# Patient Record
Sex: Female | Born: 1960 | Race: White | Hispanic: No | Marital: Married | Smoking: Current every day smoker
Health system: Southern US, Community
[De-identification: ages and names within clinical notes are randomized; demographics above are authoritative.]

## PROBLEM LIST (undated history)

## (undated) DIAGNOSIS — C19 Malignant neoplasm of rectosigmoid junction: Secondary | ICD-10-CM

## (undated) DIAGNOSIS — R011 Cardiac murmur, unspecified: Secondary | ICD-10-CM

## (undated) DIAGNOSIS — Z8042 Family history of malignant neoplasm of prostate: Secondary | ICD-10-CM

## (undated) DIAGNOSIS — Z808 Family history of malignant neoplasm of other organs or systems: Secondary | ICD-10-CM

## (undated) DIAGNOSIS — K649 Unspecified hemorrhoids: Secondary | ICD-10-CM

## (undated) DIAGNOSIS — Z800 Family history of malignant neoplasm of digestive organs: Secondary | ICD-10-CM

## (undated) HISTORY — DX: Family history of malignant neoplasm of prostate: Z80.42

## (undated) HISTORY — DX: Family history of malignant neoplasm of digestive organs: Z80.0

## (undated) HISTORY — DX: Cardiac murmur, unspecified: R01.1

## (undated) HISTORY — PX: NO PAST SURGERIES: SHX2092

## (undated) HISTORY — DX: Family history of malignant neoplasm of other organs or systems: Z80.8

## (undated) HISTORY — DX: Unspecified hemorrhoids: K64.9

---

## 1998-05-23 ENCOUNTER — Other Ambulatory Visit: Admission: RE | Admit: 1998-05-23 | Discharge: 1998-05-23 | Payer: Self-pay | Admitting: Obstetrics and Gynecology

## 2004-10-17 ENCOUNTER — Encounter: Admission: RE | Admit: 2004-10-17 | Discharge: 2004-10-17 | Payer: Self-pay | Admitting: Gastroenterology

## 2010-06-26 ENCOUNTER — Encounter (HOSPITAL_COMMUNITY)
Admission: RE | Admit: 2010-06-26 | Discharge: 2010-08-17 | Payer: Self-pay | Source: Home / Self Care | Attending: Cardiology | Admitting: Cardiology

## 2010-10-29 LAB — HEPATIC FUNCTION PANEL
ALT: 81 U/L — ABNORMAL HIGH (ref 0–35)
AST: 57 U/L — ABNORMAL HIGH (ref 0–37)
Bilirubin, Direct: 0.1 mg/dL (ref 0.0–0.3)
Total Bilirubin: 0.9 mg/dL (ref 0.3–1.2)

## 2010-10-29 LAB — BASIC METABOLIC PANEL
CO2: 28 mEq/L (ref 19–32)
GFR calc non Af Amer: >60 mL/min (ref >60)
Glucose, Bld: 93 mg/dL (ref 70–99)
Potassium: 4.4 mEq/L (ref 3.5–5.1)
Sodium: 140 mEq/L (ref 135–145)

## 2010-10-29 LAB — LIPID PANEL
Total CHOL/HDL Ratio: 4.6 RATIO
Triglycerides: 98 mg/dL (ref <150)
VLDL: 20 mg/dL (ref 0–40)

## 2020-05-28 ENCOUNTER — Ambulatory Visit
Admission: EM | Admit: 2020-05-28 | Discharge: 2020-05-28 | Disposition: A | Discharge status: Home / Self Care | Payer: BC Managed Care – PPO

## 2020-05-28 DIAGNOSIS — R194 Change in bowel habit: Secondary | ICD-10-CM

## 2020-05-28 NOTE — ED Triage Notes (Signed)
Pt c/o having more frequent bowl movements after completing a round of amoxicillin 2 wks ago. Pt states has mucus and blood in stool.

## 2020-05-28 NOTE — ED Provider Notes (Signed)
[facility]    CSN: [id number] Arrival date & time: 05/28/20  1345      History   Chief Complaint Chief Complaint  Patient presents with  . blood in stool    HPI Angela Mcpherson is a 59 y.o. female presenting today for evaluation of change in stool.  Patient reports that she recently completed 2-week course of amoxicillin due to dental infection.  She reports towards the end of her course of antibiotics she began to develop looser more frequent stools as well as has noticed some occasional mucus and blood-tinged mucus in her stools.  Occasionally will notice bright red blood per rectum with wiping.  Blood and mucus is darker.  She denies any says abdominal pain.  Has had some slight pressure noted on bladder, but denies any urinary symptoms.  She occasionally will feel bloated, but denies currently.  Denies nausea or vomiting.  Has approximately 3-4 bowel movements per day, denies complete watery stools, still have some consistency.  She denies any recent travel or exposure to fresh water.  Denies any history of prior GI problems.  Denies prior surgeries on stomach.  Has never had colonoscopy.  Has been using apple cider vinegar and probiotics.     HPI  History reviewed. No pertinent past medical history.  There are no problems to display for this patient.   History reviewed. No pertinent surgical history.  OB History   No obstetric history on file.      Home Medications    Prior to Admission medications   Medication Sig Start Date End Date Taking? Authorizing Provider  calcium-vitamin D (OSCAL WITH D) 500-200 MG-UNIT tablet Take 1 tablet by mouth.   Yes [provider]    Family History No family history on file.  Social History Social History   Tobacco Use  . Smoking status: Current Every Day Smoker    Types: Cigarettes  . Smokeless tobacco: Never Used  Substance Use Topics  . Alcohol use: Yes    Comment: occ  . Drug use: Never      Allergies   Patient has no known allergies.   Review of Systems Review of Systems  Constitutional: Negative for fever.  Respiratory: Negative for shortness of breath.   Cardiovascular: Negative for chest pain.  Gastrointestinal: Positive for blood in stool. Negative for abdominal pain, diarrhea, nausea and vomiting.  Genitourinary: Negative for dysuria, flank pain, genital sores, hematuria, menstrual problem, vaginal bleeding, vaginal discharge and vaginal pain.  Musculoskeletal: Negative for back pain.  Skin: Negative for rash.  Neurological: Negative for dizziness, light-headedness and headaches.     Physical Exam Triage Vital Signs ED Triage Vitals  Enc Vitals Group     BP 05/28/20 1615 136/85     Pulse Rate 05/28/20 1615 85     Resp 05/28/20 1615 18     Temp 05/28/20 1615 98.1 F (36.7 C)     Temp Source 05/28/20 1615 Oral     SpO2 05/28/20 1615 98 %     Weight --      Height --      Head Circumference --      Peak Flow --      Pain Score 05/28/20 1616 0     Pain Loc --      Pain Edu? --      Excl. in [place]? --    No data found.  Updated Vital Signs BP 136/85 (BP Location: Left Arm)   Pulse 85  Temp 98.1 F (36.7 C) (Oral)   Resp 18   SpO2 98%   Visual Acuity Right Eye Distance:   Left Eye Distance:   Bilateral Distance:    Right Eye Near:   Left Eye Near:    Bilateral Near:     Physical Exam Vitals and nursing note reviewed.  Constitutional:      Appearance: She is well-developed.     Comments: No acute distress  HENT:     Head: Normocephalic and atraumatic.     Nose: Nose normal.  Eyes:     Conjunctiva/sclera: Conjunctivae normal.  Cardiovascular:     Rate and Rhythm: Normal rate.  Pulmonary:     Effort: Pulmonary effort is normal. No respiratory distress.     Comments: Breathing comfortably at rest, CTABL, no wheezing, rales or other adventitious sounds auscultated Abdominal:     General: There is no distension.     Tenderness:  There is no abdominal tenderness.     Comments: Soft, nondistended, nontender to palpation  Musculoskeletal:        General: Normal range of motion.     Cervical back: Neck supple.  Skin:    General: Skin is warm and dry.  Neurological:     Mental Status: She is alert and oriented to person, place, and time.      UC Treatments / Results  Labs (all labs ordered are listed, but only abnormal results are displayed) Labs Reviewed - No data to display  EKG   Radiology No results found.  Procedures Procedures (including critical care time)  Medications Ordered in UC Medications - No data to display  Initial Impression / Assessment and Plan / UC Course  I have reviewed the triage vital signs and the nursing notes.  Pertinent labs & imaging results that were available during my care of the patient were reviewed by me and considered in my medical decision making (see chart for details).     Frequent more loose stools after antibiotic use.  Recommend obtaining stool sample to evaluate for any underlying infectious causes and rule out C. difficile although seems less likely given no systemic symptoms in stool does continue to have some consistency.  Encourage patient to follow-up with gastroenterology given reported blood in stool and since she is 59 years old and has never had colonoscopy.  May continue probiotics and apple cider vinegar.  Do not suspect abdominal emergency requiring emergent imaging at this time.  Continue symptomatic and supportive care.  Discussed strict return precautions. Patient verbalized understanding and is agreeable with plan.  Final Clinical Impressions(s) / UC Diagnoses   Final diagnoses:  Frequent bowel movements  Change in stool habits     Discharge Instructions     Continue apple cider vinegar; Probiotics Return stool sample to send off Continue to monitor    ED Prescriptions    None     PDMP not reviewed this encounter.    Janith Lima, PA-C 05/28/20 1733

## 2020-05-28 NOTE — Discharge Instructions (Signed)
Continue apple cider vinegar; Probiotics Return stool sample to send off Continue to monitor

## 2020-05-29 ENCOUNTER — Telehealth: Payer: Self-pay

## 2020-05-30 LAB — GASTROINTESTINAL PANEL BY PCR, STOOL (REPLACES STOOL CULTURE)

## 2020-12-12 ENCOUNTER — Ambulatory Visit: Payer: 59 | Admitting: Gastroenterology

## 2020-12-12 ENCOUNTER — Other Ambulatory Visit: Payer: 59

## 2020-12-12 ENCOUNTER — Encounter: Payer: Self-pay | Admitting: Gastroenterology

## 2020-12-12 DIAGNOSIS — R195 Other fecal abnormalities: Secondary | ICD-10-CM

## 2020-12-12 DIAGNOSIS — R197 Diarrhea, unspecified: Secondary | ICD-10-CM

## 2020-12-12 MED ORDER — VANCOMYCIN HCL 125 MG PO CAPS: 125.0000 mg | ORAL_CAPSULE | Freq: Four times a day (QID) | ORAL | 0 refills | Status: AC

## 2020-12-12 NOTE — Progress Notes (Signed)
HPI: This is a very pleasant 61 year old woman who is here with her husband today.  She noticed a significant change in her bowels starting about a year ago when she was put on amoxicillin for a dental infection.  She underwent to 10-day courses of the amoxicillin and ever since then she has had significant bowel frequency.  She will go 10-20 times a day, usually soft pellet like stools.  She will get up in the middle the night at times to move her bowels as well.  Her weight has been overall stable.  She has no nausea or vomiting and no significant abdominal pains.  She has become very raw at her bottom and has had some anal discomfort as well as some periodic bleeding.  She has never had colon cancer screening that she is aware of  Old Data Reviewed: GI pathogen panel October 2021 was negative, of note it did not include C. difficile.   Review of systems: Pertinent positive and negative review of systems were noted in the above HPI section. All other review negative.   No past medical history on file.  No past surgical history on file.  Current Outpatient Medications  Medication Sig Dispense Refill  . cholecalciferol (VITAMIN D3) 25 MCG (1000 UNIT) tablet Take 1,000 Units by mouth daily.    . phenylephrine-shark liver oil-mineral oil-petrolatum (PREPARATION H) 0.25-14-74.9 % rectal ointment Place 1 application rectally as needed for hemorrhoids.    . psyllium (METAMUCIL) 0.52 g capsule Take 0.52 g by mouth daily.    . vitamin C (ASCORBIC ACID) 500 MG tablet Take 500 mg by mouth daily.     No current facility-administered medications for this visit.    Allergies as of 12/12/2020 - Review Complete 12/12/2020  Allergen Reaction Noted  . Streptogramins  12/12/2020    No family history on file.  Social History   Socioeconomic History  . Marital status: Married    Spouse name: Not on file  . Number of children: Not on file  . Years of education: Not on file  . Highest  education level: Not on file  Occupational History  . Not on file  Tobacco Use  . Smoking status: Current Every Day Smoker    Types: Cigarettes  . Smokeless tobacco: Never Used  Substance and Sexual Activity  . Alcohol use: Yes    Comment: occ  . Drug use: Never  . Sexual activity: Not on file  Other Topics Concern  . Not on file  Social History Narrative  . Not on file   Social Determinants of Health   Financial Resource Strain: Not on file  Food Insecurity: Not on file  Transportation Needs: Not on file  Physical Activity: Not on file  Stress: Not on file  Social Connections: Not on file  Intimate Partner Violence: Not on file     Physical Exam: BP 124/64   Pulse 72   Ht 5\' 3"  (1.6 m)   Wt 125 lb (56.7 kg)   BMI 22.14 kg/m  Constitutional: generally well-appearing Psychiatric: alert and oriented x3 Eyes: extraocular movements intact Mouth: oral pharynx moist, no lesions Neck: supple no lymphadenopathy Cardiovascular: heart regular rate and rhythm Lungs: clear to auscultation bilaterally Abdomen: soft, nontender, nondistended, no obvious ascites, no peritoneal signs, normal bowel sounds Extremities: no lower extremity edema bilaterally Skin: no lesions on visible extremities Rectal examination with female assistant and her husband in the room: No external anal hemorrhoids, no obvious anal fissures, there was some move  swelling internal anus very suggestive of swollen internal hemorrhoids.  Stool was absent.  Assessment and plan: 60 y.o. female with significant change in bowels since antibiotics for dental infection about 1 year ago  I think it is very possible that she has C. difficile infection.  She was really under the impression that that was "ruled out" by GI pathogen panel October 2021 however we pulled up her results through [facility] and I showed her that C. difficile was never even tested for.  We will get dedicated C. difficile testing today and I am  writing her prescription for vancomycin 125 mg, 4 times daily for 2 weeks.  She will not start this until after she sends in the stool testing.  She will return to see me in 2 months and knows to call if she is not feeling better with the above regimen.   Please see the "Patient Instructions" section for addition details about the plan.   Owens Loffler, MD [facility] 12/12/2020, 11:31 AM  Total time on date of encounter was 46  minutes (this included time spent preparing to see the patient reviewing records; obtaining and/or reviewing separately obtained history; performing a medically appropriate exam and/or evaluation; counseling and educating the patient and family if present; ordering medications, tests or procedures if applicable; and documenting clinical information in the health record).

## 2020-12-12 NOTE — Patient Instructions (Addendum)
If you are age 59 or younger, your body mass index should be between 19-25. Your Body mass index is 22.14 kg/m. If this is out of the aformentioned range listed, please consider follow up with your Primary Care Provider.   Your provider has requested that you go to the basement level for lab work before leaving today. Press "B" on the elevator. The lab is located at the first door on the left as you exit the elevator.  Due to recent changes in healthcare laws, you may see the results of your imaging and laboratory studies on MyChart before your provider has had a chance to review them.  We understand that in some cases there may be results that are confusing or concerning to you. Not all laboratory results come back in the same time frame and the provider may be waiting for multiple results in order to interpret others.  Please give [place] 48 hours in order for your provider to thoroughly review all the results before contacting the office for clarification of your results.   We have sent the following medications to your pharmacy for you to pick up at your convenience:  START: vancomycin 125mg  one tablet by mouth 4 times daily for 2 weeks. Please do NOT start medication until after you have results of lab work.  You will follow up in our office on 02-13-2021 at 9:50am  Thank you for entrusting me with your care and choosing [facility].  Dr Ardis Hughs

## 2020-12-14 LAB — CLOSTRIDIUM DIFFICILE TOXIN B, QUALITATIVE, REAL-TIME PCR: Toxigenic C. Difficile by PCR: NOT DETECTED

## 2021-01-08 ENCOUNTER — Telehealth: Payer: Self-pay | Admitting: Gastroenterology

## 2021-01-08 ENCOUNTER — Ambulatory Visit (AMBULATORY_SURGERY_CENTER): Payer: Self-pay

## 2021-01-08 ENCOUNTER — Other Ambulatory Visit: Payer: Self-pay

## 2021-01-08 VITALS — Ht 63.0 in | Wt 123.0 lb

## 2021-01-08 DIAGNOSIS — R197 Diarrhea, unspecified: Secondary | ICD-10-CM

## 2021-01-08 MED ORDER — NA SULFATE-K SULFATE-MG SULF 17.5-3.13-1.6 GM/177ML PO SOLN: 1.0000 | Freq: Once | ORAL | 0 refills | Status: AC

## 2021-01-08 NOTE — Progress Notes (Signed)
No allergies to soy or egg Pt is not on blood thinners or diet pills No hx of sedation/intubation Denies atrial flutter/fib Denies constipation   Emmi instructions given to pt  Pt is aware of Covid safety and care partner requirements.

## 2021-01-09 NOTE — Telephone Encounter (Signed)
Dr Angela Mcpherson the pt has complaints of very swollen painful hemorrhoids.  She says she has been taking hot showers, tried using Prep H,  and that has not helped.  I advised her to do Sitz baths and I will send Dr Angela Mcpherson a message to see if he would like to send prescription strength suppository.  She does have a colon scheduled for 6/20.  Please advise

## 2021-01-09 NOTE — Telephone Encounter (Signed)
Spoke with [facility] to have medication compounded.   Left message on machine to call back

## 2021-01-09 NOTE — Telephone Encounter (Signed)
Interesting, she had no external hemorrhoids on exam at her office visit recently.  Lets call in hydrocortisone/lidocaine topical gel 2.5% / 3%.  Use applicator to apply to her anal canal.  Apply twice daily, dispense 1 month, 2 refills.  Thanks

## 2021-01-09 NOTE — Telephone Encounter (Signed)
The pt has been advised and will pick up the prescription. She will keep appt as planned for colon.

## 2021-02-01 ENCOUNTER — Encounter: Payer: Self-pay | Admitting: Gastroenterology

## 2021-02-04 ENCOUNTER — Other Ambulatory Visit (INDEPENDENT_AMBULATORY_CARE_PROVIDER_SITE_OTHER): Payer: 59

## 2021-02-04 ENCOUNTER — Ambulatory Visit (AMBULATORY_SURGERY_CENTER): Payer: 59 | Admitting: Gastroenterology

## 2021-02-04 ENCOUNTER — Encounter: Payer: Self-pay | Admitting: Gastroenterology

## 2021-02-04 ENCOUNTER — Other Ambulatory Visit: Payer: Self-pay | Admitting: *Deleted

## 2021-02-04 ENCOUNTER — Other Ambulatory Visit: Payer: Self-pay

## 2021-02-04 VITALS — BP 107/60 | HR 72 | Temp 96.8°F | Resp 18 | Ht 63.0 in | Wt 123.0 lb

## 2021-02-04 DIAGNOSIS — C188 Malignant neoplasm of overlapping sites of colon: Secondary | ICD-10-CM

## 2021-02-04 DIAGNOSIS — R195 Other fecal abnormalities: Secondary | ICD-10-CM

## 2021-02-04 DIAGNOSIS — D125 Benign neoplasm of sigmoid colon: Secondary | ICD-10-CM | POA: Diagnosis not present

## 2021-02-04 DIAGNOSIS — R197 Diarrhea, unspecified: Secondary | ICD-10-CM

## 2021-02-04 DIAGNOSIS — K648 Other hemorrhoids: Secondary | ICD-10-CM

## 2021-02-04 DIAGNOSIS — D120 Benign neoplasm of cecum: Secondary | ICD-10-CM

## 2021-02-04 LAB — CBC WITH DIFFERENTIAL/PLATELET
Basophils Absolute: 0.1 10*3/uL (ref 0.0–0.1)
Basophils Relative: 0.8 % (ref 0.0–3.0)
Eosinophils Absolute: 0.1 10*3/uL (ref 0.0–0.7)
Eosinophils Relative: 0.5 % (ref 0.0–5.0)
HCT: 43.5 % (ref 36.0–46.0)
Hemoglobin: 14.8 g/dL (ref 12.0–15.0)
Lymphocytes Relative: 25.8 % (ref 12.0–46.0)
Lymphs Abs: 2.7 10*3/uL (ref 0.7–4.0)
MCHC: 34 g/dL (ref 30.0–36.0)
MCV: 90.8 fl (ref 78.0–100.0)
Monocytes Absolute: 0.6 10*3/uL (ref 0.1–1.0)
Monocytes Relative: 6.1 % (ref 3.0–12.0)
Neutro Abs: 7.1 10*3/uL (ref 1.4–7.7)
Neutrophils Relative %: 66.8 % (ref 43.0–77.0)
Platelets: 186 10*3/uL (ref 150.0–400.0)
RBC: 4.79 Mil/uL (ref 3.87–5.11)
RDW: 15.8 % — ABNORMAL HIGH (ref 11.5–15.5)
WBC: 10.6 10*3/uL — ABNORMAL HIGH (ref 4.0–10.5)

## 2021-02-04 LAB — COMPREHENSIVE METABOLIC PANEL
ALT: 71 U/L — ABNORMAL HIGH (ref 0–35)
AST: 48 U/L — ABNORMAL HIGH (ref 0–37)
Albumin: 4.6 g/dL (ref 3.5–5.2)
Alkaline Phosphatase: 100 U/L (ref 39–117)
BUN: 12 mg/dL (ref 6–23)
CO2: 23 mEq/L (ref 19–32)
Calcium: 9.4 mg/dL (ref 8.4–10.5)
Chloride: 105 mEq/L (ref 96–112)
Creatinine, Ser: 0.57 mg/dL (ref 0.40–1.20)
GFR: 99.5 mL/min (ref >60.00)
Glucose, Bld: 110 mg/dL — ABNORMAL HIGH (ref 70–99)
Potassium: 4.1 mEq/L (ref 3.5–5.1)
Sodium: 139 mEq/L (ref 135–145)
Total Bilirubin: 0.8 mg/dL (ref 0.2–1.2)
Total Protein: 7.6 g/dL (ref 6.0–8.3)

## 2021-02-04 MED ORDER — SODIUM CHLORIDE 0.9 % IV SOLN: 500.0000 mL | Freq: Once | INTRAVENOUS | Status: DC

## 2021-02-04 NOTE — Progress Notes (Signed)
VS by   Pt's states no medical or surgical changes since previsit or office visit.  

## 2021-02-04 NOTE — Progress Notes (Signed)
To PACU, VSS. Report to [email or web address] 

## 2021-02-04 NOTE — Progress Notes (Signed)
Post procedure patient used the restroom and had a small amount of blood in the bottom of the toilet.  Explained to patient that this was due to the biopsies being done so close to the rectum and that if it persists or gets worse to call us. Patient verbalized understanding.  Dr. Ardis Hughs made aware and agreed tpo this plan.

## 2021-02-04 NOTE — Progress Notes (Signed)
Called to room to assist during endoscopic procedure.  Patient ID and intended procedure confirmed with present staff. Received instructions for my participation in the procedure from the performing physician.  

## 2021-02-04 NOTE — Op Note (Signed)
[facility] Patient Name: Angela Mcpherson Procedure Date: 02/04/2021 8:27 AM MRN: [id number] Endoscopist: Milus Banister , MD Age: 60 Referring MD:  Date of Birth: 14-Aug-1961 Gender: Female Account #: [id number] Procedure:                Colonoscopy Indications:              Change in bowel habits, weight loss Medicines:                Monitored Anesthesia Care Procedure:                Pre-Anesthesia Assessment:                           - Prior to the procedure, a History and Physical                            was performed, and patient medications and                            allergies were reviewed. The patient's tolerance of                            previous anesthesia was also reviewed. The risks                            and benefits of the procedure and the sedation                            options and risks were discussed with the patient.                            All questions were answered, and informed consent                            was obtained. Prior Anticoagulants: The patient has                            taken no previous anticoagulant or antiplatelet                            agents. ASA Grade Assessment: II - A patient with                            mild systemic disease. After reviewing the risks                            and benefits, the patient was deemed in                            satisfactory condition to undergo the procedure.                           After obtaining informed consent, the colonoscope  was passed under direct vision. Throughout the                            procedure, the patient's blood pressure, pulse, and                            oxygen saturations were monitored continuously. The                            Olympus CF-HQ190L (Serial# [id number]) Colonoscope was                            introduced through the anus and advanced to the the                            cecum, identified by  appendiceal orifice and                            ileocecal valve. The colonoscopy was performed                            without difficulty. The patient tolerated the                            procedure well. The quality of the bowel                            preparation was good. The ileocecal valve,                            appendiceal orifice, and rectum were photographed. Scope In: 8:40:05 AM Scope Out: 9:08:38 AM Scope Withdrawal Time: 0 hours 14 minutes 51 seconds  Total Procedure Duration: 0 hours 28 minutes 33 seconds  Findings:                 Two sessile polyps were found in the sigmoid colon                            and cecum. The polyps were 2 to 6 mm in size. These                            polyps were removed with a cold snare. Resection                            and retrieval were complete.                           A fungating, infiltrative and ulcerated partially                            obstructing mass was found in the recto-sigmoid                            colon (distal edge about 7cm  from the anus). The                            mass was partially circumferential (involving                            one-half of the lumen circumference). The mass                            measured eight cm in length. This was biopsied with                            a cold forceps for histology. The distal edge of                            the mass was tattooed with two submucosal                            injections of Spot (carbon black).                           Internal hemorrhoids were found. The hemorrhoids                            were small.                           The exam was otherwise without abnormality on                            direct and retroflexion views. Complications:            No immediate complications. Estimated blood loss:                            None. Estimated Blood Loss:     Estimated blood loss: none. Impression:                - Two 2 to 6 mm polyps in the sigmoid colon and in                            the cecum, removed with a cold snare. Resected and                            retrieved.                           - A fungating, infiltrative and ulcerated partially                            obstructing mass was found in the recto-sigmoid                            colon (distal edge about 7cm from the anus). The  mass was partially circumferential (involving                            one-half of the lumen circumference). The mass                            measured eight cm in length. This was biopsied with                            a cold forceps for histology. The distal edge of                            the mass was tattooed with two submucosal                            injections of Spot (carbon black). Sigmoid colon                            felt somewhat fixed in place (large CRC or perhaps                            invasion into colon from other primary site?)                           - Internal hemorrhoids.                           - The examination was otherwise normal on direct                            and retroflexion views. Recommendation:           - Patient has a contact number available for                            emergencies. The signs and symptoms of potential                            delayed complications were discussed with the                            patient. Return to normal activities tomorrow.                            Written discharge instructions were provided to the                            patient.                           - Resume previous diet.                           - Continue present medications.                           -  Await pathology results. Sent [facility]                           - Staging workup: CT scan chest/abd/pelvis with IV                            and oral contrast. Also labs CBC, CMET, CEA.                            - Start referral process to [facility]. Milus Banister, MD 02/04/2021 9:16:02 AM This report has been signed electronically.

## 2021-02-04 NOTE — Progress Notes (Signed)
Post procedure patient used the toilet and had small amount of blood in the bottom of the toilet.  Explained to patient that it was due to the biopsies being taken in the lower part of the rectum and to call if the bleeding continues. Patient verbalized understanding.

## 2021-02-04 NOTE — Patient Instructions (Signed)
Information on polyps and hemorrhoids given to you today.  Await pathology results.  Resume previous diet and medications.  CT scan of chest/abdomen/pelvis with IV and oral contrast.  Also labwork to be done today.  Start referral process to [facility].  YOU HAD AN ENDOSCOPIC PROCEDURE TODAY AT [facility]:   Refer to the procedure report that was given to you for any specific questions about what was found during the examination.  If the procedure report does not answer your questions, please call your gastroenterologist to clarify.  If you requested that your care partner not be given the details of your procedure findings, then the procedure report has been included in a sealed envelope for you to review at your convenience later.  YOU SHOULD EXPECT: Some feelings of bloating in the abdomen. Passage of more gas than usual.  Walking can help get rid of the air that was put into your GI tract during the procedure and reduce the bloating. If you had a lower endoscopy (such as a colonoscopy or flexible sigmoidoscopy) you may notice spotting of blood in your stool or on the toilet paper. If you underwent a bowel prep for your procedure, you may not have a normal bowel movement for a few days.  Please Note:  You might notice some irritation and congestion in your nose or some drainage.  This is from the oxygen used during your procedure.  There is no need for concern and it should clear up in a day or so.  SYMPTOMS TO REPORT IMMEDIATELY:  Following lower endoscopy (colonoscopy or flexible sigmoidoscopy):  Excessive amounts of blood in the stool  Significant tenderness or worsening of abdominal pains  Swelling of the abdomen that is new, acute  Fever of 100F or higher  For urgent or emergent issues, a gastroenterologist can be reached at any hour by calling [phone]. Do not use MyChart messaging for urgent concerns.    DIET:  We do recommend a small meal at first, but  then you may proceed to your regular diet.  Drink plenty of fluids but you should avoid alcoholic beverages for 24 hours.  ACTIVITY:  You should plan to take it easy for the rest of today and you should NOT DRIVE or use heavy machinery until tomorrow (because of the sedation medicines used during the test).    FOLLOW UP: Our staff will call the number listed on your records 48-72 hours following your procedure to check on you and address any questions or concerns that you may have regarding the information given to you following your procedure. If we do not reach you, we will leave a message.  We will attempt to reach you two times.  During this call, we will ask if you have developed any symptoms of COVID 19. If you develop any symptoms (ie: fever, flu-like symptoms, shortness of breath, cough etc.) before then, please call [phone].  If you test positive for Covid 19 in the 2 weeks post procedure, please call and report this information to [place].    If any biopsies were taken you will be contacted by phone or by letter within the next 1-3 weeks.  Please call us at [phone] if you have not heard about the biopsies in 3 weeks.    SIGNATURES/CONFIDENTIALITY: You and/or your care partner have signed paperwork which will be entered into your electronic medical record.  These signatures attest to the fact that that the information above on your After  Visit Summary has been reviewed and is understood.  Full responsibility of the confidentiality of this discharge information lies with you and/or your care-partner.

## 2021-02-05 ENCOUNTER — Other Ambulatory Visit: Payer: Self-pay

## 2021-02-05 ENCOUNTER — Other Ambulatory Visit: Payer: Self-pay | Admitting: *Deleted

## 2021-02-05 DIAGNOSIS — K6389 Other specified diseases of intestine: Secondary | ICD-10-CM

## 2021-02-05 LAB — CEA: CEA: 596.9 ng/mL — ABNORMAL HIGH

## 2021-02-06 ENCOUNTER — Telehealth: Payer: Self-pay

## 2021-02-06 NOTE — Telephone Encounter (Signed)
LVM

## 2021-02-08 ENCOUNTER — Other Ambulatory Visit: Payer: Self-pay

## 2021-02-08 ENCOUNTER — Ambulatory Visit (HOSPITAL_COMMUNITY)
Admission: RE | Admit: 2021-02-08 | Discharge: 2021-02-08 | Disposition: A | Discharge status: Home / Self Care | Payer: 59 | Source: Ambulatory Visit | Attending: Gastroenterology | Admitting: Gastroenterology

## 2021-02-08 DIAGNOSIS — K6389 Other specified diseases of intestine: Secondary | ICD-10-CM | POA: Insufficient documentation

## 2021-02-08 MED ORDER — IOHEXOL 300 MG/ML  SOLN
100.0000 mL | Freq: Once | INTRAMUSCULAR | Status: AC | PRN
  Administered 2021-02-08: 100 mL via INTRAVENOUS

## 2021-02-08 MED ORDER — SODIUM CHLORIDE (PF) 0.9 % IJ SOLN
INTRAMUSCULAR | Status: AC
  Filled 2021-02-08: qty 50

## 2021-02-11 ENCOUNTER — Other Ambulatory Visit: Payer: Self-pay

## 2021-02-11 DIAGNOSIS — K6389 Other specified diseases of intestine: Secondary | ICD-10-CM

## 2021-02-11 NOTE — Progress Notes (Addendum)
[place]   Telephone:([phone] Fax:([phone]) [place] Note   Patient Care Team: Pcp, No as PCP - General Truitt Merle, MD as Consulting Physician (Oncology)  Date of Service:  02/12/2021   CHIEF COMPLAINTS/PURPOSE OF CONSULTATION:  Metastatic Colon cancer  REFERRING PHYSICIAN:  Dr Ardis Hughs  Oncology History Overview Note  Cancer Staging Malignant neoplasm of rectosigmoid junction [facility]) Staging form: Colon and Rectum, AJCC 8th Edition - Clinical stage from 02/04/2021: Stage IVB (cTX, cN2, cM1b) - Signed by Truitt Merle, MD on 02/12/2021 Stage prefix: Initial diagnosis    Malignant neoplasm of rectosigmoid junction ([place])  02/04/2021 Procedure   Colonoscopy by Dr Ardis Hughs 02/04/21 IMPRESSION - Two 2 to 6 mm polyps in the sigmoid colon and in the cecum, removed with a cold snare. Resected and retrieved. - A fungating, infiltrative and ulcerated partially obstructing mass was found in the rectosigmoid colon (distal edge about 7cm from the anus). The mass was partially circumferential (involving one-half of the lumen circumference). The mass measured eight cm in length. This was biopsied with a cold forceps for histology. The distal edge of the mass was tattooed with two submucosal injections of Spot (carbon black). Sigmoid colon felt somewhat fixed in place (large CRC or perhaps invasion into colon from other primary site?) - Internal hemorrhoids. - The examination was otherwise normal on direct and retroflexion views.   02/04/2021 Pathology Results   Diagnosis 1. 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 - NO HIGH-GRADE DYSPLASIA OR CARCINOMA. 2. Colon, biopsy, rectosigmoid - INVASIVE ADENOCARCINOMA. - SEE NOTE.   02/04/2021 Cancer Staging   Staging form: Colon and Rectum, AJCC 8th Edition - Clinical stage from 02/04/2021: Stage IVB (cTX, cN2, cM1b) - Signed by Truitt Merle, MD on 02/12/2021  Stage prefix: Initial diagnosis     02/08/2021 Imaging   CT CHEST, ABDOMEN AND PELVIS W CONTRAST  IMPRESSION: 1. There is a large, concentric mass of the rectosigmoid junction measuring 7.8 x 5.9 x 5.2 cm. Findings are consistent with primary colon malignancy identified by colonoscopy. 2. Multiple enlarged perirectal, bilateral pelvic sidewall, and retroperitoneal lymph nodes, consistent with nodal metastatic disease. 3. There are multiple small low-attenuation lesions of the liver parenchyma, incompletely characterized although suspicious for hepatic metastatic disease. PET-CT or multiphasic contrast enhanced MRI could be used to more clearly characterize. 4. There are multiple small bilateral pulmonary nodules, measuring 3 mm and smaller, nonspecific although highly suspicious for early pulmonary metastatic disease given overall constellation of findings.   02/12/2021 Initial Diagnosis   Malignant neoplasm of rectosigmoid junction ([facility])     HISTORY OF PRESENTING ILLNESS:  Angela Mcpherson 60 y.o. female is a here because of metastatic colon cancer. The patient was referred by Dr Ardis Hughs. The patient presents to the clinic today accompanied by her husband Antony Haste.   She presents with frequent bowel movement and rectal pain for past 10-12 months.  She initially had antibiotics for dental issue, and thought it was related to her antibiotics.  She has not seen doctors for the past 30 years.  She initially thought it was related to hemorrhoid, and did not seek medical attention.  She has tried changing her diet, with some improvement.  Due to worsening symptoms and intermittent mild hematochezia, she did go to urgent care in 05/2020.  She was recommended to see GI.  In April 2022, she saw Dr. Ardis Hughs (who is her husband's GI), and finally had colonoscopy ON 02/04/2021, which showed  a large ulcerative, partially obstructive mass in the rectosigmoid colon, biopsy confirmed adenocarcinoma.  She had a staging CT scan a few days ago.  She  has mild abdominal bloating, and gasy feeling, no nausea, vomiting.  Her appetite is normal, she has lost 20 pounds in the past year.  She reports her rectal pain has been worse lately, persistent, but fluctuates.  The pain level is 8-9/10 per pt. she has not tried any over-the-counter pain medication. She has mild to moderate fatigue, energy is low, 60-70% of normal, she has slowed down her activities, but still functions well at home.  She lives with her husband, has no children.   REVIEW OF SYSTEMS:    Constitutional: Denies fevers, chills or abnormal night sweats, (+) fatigue and weight loss  Eyes: Denies blurriness of vision, double vision or watery eyes Ears, nose, mouth, throat, and face: Denies mucositis or sore throat Respiratory: Denies cough, dyspnea or wheezes Cardiovascular: Denies palpitation, chest discomfort or lower extremity swelling Gastrointestinal:  see HPI  Lymphatics: Denies new lymphadenopathy or easy bruising Neurological:Denies numbness, tingling or new weaknesses Behavioral/Psych: Mood is stable, no new changes  All other systems were reviewed with the patient and are negative.   MEDICAL HISTORY:  Past Medical History:  Diagnosis Date   Heart murmur    dx at 26   MVP is stable   Hemorrhoids     SURGICAL HISTORY: Past Surgical History:  Procedure Laterality Date   NO PAST SURGERIES      SOCIAL HISTORY: Social History   Socioeconomic History   Marital status: Married    Spouse name: Not on file   Number of children: Not on file   Years of education: Not on file   Highest education level: Not on file  Occupational History   Not on file  Tobacco Use   Smoking status: Every Day    Pack years: 0.00    Types: Cigarettes   Smokeless tobacco: Never  Vaping Use   Vaping Use: Never used  Substance and Sexual Activity   Alcohol use: Yes    Comment: occ   Drug use: Never   Sexual activity: Not on file  Other Topics Concern   Not on file  Social  History Narrative   Not on file   Social Determinants of Health   Financial Resource Strain: Not on file  Food Insecurity: Not on file  Transportation Needs: Not on file  Physical Activity: Not on file  Stress: Not on file  Social Connections: Not on file  Intimate Partner Violence: Not on file    FAMILY HISTORY: Family History  Problem Relation Age of Onset   Stomach cancer Maternal Grandfather 48   Colon cancer Neg Hx    Colon polyps Neg Hx    Esophageal cancer Neg Hx    Rectal cancer Neg Hx     ALLERGIES:  is allergic to codeine, epinephrine (anaphylaxis), and streptogramins.  MEDICATIONS:  Current Outpatient Medications  Medication Sig Dispense Refill   cholecalciferol (VITAMIN D3) 25 MCG (1000 UNIT) tablet Take 1,000 Units by mouth daily.     hydrocortisone 2.5 % cream Apply to anal canal twice daily as directed.     phenylephrine-shark liver oil-mineral oil-petrolatum (PREPARATION H) 0.25-14-74.9 % rectal ointment Place 1 application rectally as needed for hemorrhoids.     psyllium (METAMUCIL) 0.52 g capsule Take 0.52 g by mouth daily. (Patient not taking: Reported on 02/04/2021)     VALERIAN ROOT PO Take by mouth.  vitamin C (ASCORBIC ACID) 500 MG tablet Take 500 mg by mouth daily.     No current facility-administered medications for this visit.    PHYSICAL EXAMINATION: ECOG PERFORMANCE STATUS: 1 - Symptomatic but completely ambulatory  Vitals:   02/12/21 0854  BP: 108/74  Pulse: 71  Resp: 17  Temp: 98.1 F (36.7 C)  SpO2: 100%   Filed Weights   02/12/21 0854  Weight: 114 lb 12.8 oz (52.1 kg)    GENERAL:alert, no distress and comfortable SKIN: skin color, texture, turgor are normal, no rashes or significant lesions EYES: normal, Conjunctiva are pink and non-injected, sclera clear NECK: supple, thyroid normal size, non-tender, without nodularity LYMPH:  no palpable lymphadenopathy in the cervical, axillary  LUNGS: clear to auscultation and  percussion with normal breathing effort HEART: regular rate & rhythm and no murmurs and no lower extremity edema ABDOMEN:abdomen soft, non-tender and normal bowel sounds Musculoskeletal:no cyanosis of digits and no clubbing  NEURO: alert & oriented x 3 with fluent speech, no focal motor/sensory deficits  LABORATORY DATA:  I have reviewed the data as listed CBC Latest Ref Rng & Units 02/04/2021  WBC 4.0 - 10.5 K/uL 10.6(H)  Hemoglobin 12.0 - 15.0 g/dL 14.8  Hematocrit 36.0 - 46.0 % 43.5  Platelets 150.0 - 400.0 K/uL 186.0    CMP Latest Ref Rng & Units 02/04/2021 06/26/2010  Glucose 70 - 99 mg/dL 110(H) 93  BUN 6 - 23 mg/dL 12 12  Creatinine 0.40 - 1.20 mg/dL 0.57 0.69  Sodium 135 - 145 mEq/L 139 140  Potassium 3.5 - 5.1 mEq/L 4.1 4.4  Chloride 96 - 112 mEq/L 105 107  CO2 19 - 32 mEq/L 23 28  Calcium 8.4 - 10.5 mg/dL 9.4 9.6  Total Protein 6.0 - 8.3 g/dL 7.6 7.0  Total Bilirubin 0.2 - 1.2 mg/dL 0.8 0.9  Alkaline Phos 39 - 117 U/L 100 112  AST 0 - 37 U/L 48(H) 57(H)  ALT 0 - 35 U/L 71(H) 81(H)     RADIOGRAPHIC STUDIES: I have personally reviewed the radiological images as listed and agreed with the findings in the report. CT CHEST W CONTRAST  Result Date: 02/10/2021 CLINICAL DATA:  Colon mass identified by colonoscopy, weight loss EXAM: CT CHEST, ABDOMEN, AND PELVIS WITH CONTRAST TECHNIQUE: Multidetector CT imaging of the chest, abdomen and pelvis was performed following the standard protocol during bolus administration of intravenous contrast. CONTRAST:  156m OMNIPAQUE IOHEXOL 300 MG/ML SOLN, additional oral enteric contrast COMPARISON:  None. FINDINGS: CT CHEST FINDINGS Cardiovascular: No significant vascular findings. Normal heart size. No pericardial effusion. Mediastinum/Nodes: No enlarged mediastinal, hilar, or axillary lymph nodes. Thyroid gland, trachea, and esophagus demonstrate no significant findings. Lungs/Pleura: There are multiple small bilateral pulmonary nodules, for  example a 3 mm nodule of the right lower lobe (series 7, image 88), a 3 mm nodule of the peripheral right upper lobe (series 7, image 4), and a 2 mm nodule left lower lobe (series 7, image 94). No pleural effusion or pneumothorax. Musculoskeletal: No chest wall mass or suspicious bone lesions identified. CT ABDOMEN PELVIS FINDINGS Hepatobiliary: There are multiple small low-attenuation lesions of the liver parenchyma, including of the lateral right lobe, hepatic segment VI, measuring 0.6 cm (series 2, image 64), of the posterior liver dome, hepatic segment VII, measuring 1.3 x 0.9 cm (series 2, image 51), and of the tip of the left lobe of the liver, hepatic segment 2 measuring 1.5 x 1.3 cm (series 2, image 47). No gallstones, gallbladder wall  thickening, or biliary dilatation. Pancreas: Unremarkable. No pancreatic ductal dilatation or surrounding inflammatory changes. Spleen: Normal in size without significant abnormality. Adrenals/Urinary Tract: Adrenal glands are unremarkable. Kidneys are normal, without renal calculi, solid lesion, or hydronephrosis. Bladder is unremarkable. Stomach/Bowel: Stomach is within normal limits. Appendix appears normal. There is a large, concentric mass of the rectosigmoid junction measuring 7.8 x 5.9 x 5.2 cm (series 2, 104 series 6, image 84). Vascular/Lymphatic: Aortic atherosclerosis. Multiple enlarged perirectal lymph nodes measuring up to 1.0 x 0.9 cm (series 2, 106). Enlarged bilateral pelvic sidewall lymph nodes measuring to 2.5 x 1.9 cm on the left (series 2, image 110). Numerous prominent retroperitoneal lymph nodes, largest left retroperitoneal nodes measuring up to 1.4 x 1.4 cm (series 4, image 69). Reproductive: No mass or other abnormality. Other: No abdominal wall hernia or abnormality. No abdominopelvic ascites. Musculoskeletal: No acute or significant osseous findings. IMPRESSION: 1. There is a large, concentric mass of the rectosigmoid junction measuring 7.8 x 5.9 x  5.2 cm. Findings are consistent with primary colon malignancy identified by colonoscopy. 2. Multiple enlarged perirectal, bilateral pelvic sidewall, and retroperitoneal lymph nodes, consistent with nodal metastatic disease. 3. There are multiple small low-attenuation lesions of the liver parenchyma, incompletely characterized although suspicious for hepatic metastatic disease. PET-CT or multiphasic contrast enhanced MRI could be used to more clearly characterize. 4. There are multiple small bilateral pulmonary nodules, measuring 3 mm and smaller, nonspecific although highly suspicious for early pulmonary metastatic disease given overall constellation of findings. Aortic Atherosclerosis (ICD10-I70.0). Electronically Signed   By: Eddie Candle M.D.   On: 02/10/2021 15:51   CT Abdomen Pelvis W Contrast  Result Date: 02/10/2021 CLINICAL DATA:  Colon mass identified by colonoscopy, weight loss EXAM: CT CHEST, ABDOMEN, AND PELVIS WITH CONTRAST TECHNIQUE: Multidetector CT imaging of the chest, abdomen and pelvis was performed following the standard protocol during bolus administration of intravenous contrast. CONTRAST:  110m OMNIPAQUE IOHEXOL 300 MG/ML SOLN, additional oral enteric contrast COMPARISON:  None. FINDINGS: CT CHEST FINDINGS Cardiovascular: No significant vascular findings. Normal heart size. No pericardial effusion. Mediastinum/Nodes: No enlarged mediastinal, hilar, or axillary lymph nodes. Thyroid gland, trachea, and esophagus demonstrate no significant findings. Lungs/Pleura: There are multiple small bilateral pulmonary nodules, for example a 3 mm nodule of the right lower lobe (series 7, image 88), a 3 mm nodule of the peripheral right upper lobe (series 7, image 4), and a 2 mm nodule left lower lobe (series 7, image 94). No pleural effusion or pneumothorax. Musculoskeletal: No chest wall mass or suspicious bone lesions identified. CT ABDOMEN PELVIS FINDINGS Hepatobiliary: There are multiple small  low-attenuation lesions of the liver parenchyma, including of the lateral right lobe, hepatic segment VI, measuring 0.6 cm (series 2, image 64), of the posterior liver dome, hepatic segment VII, measuring 1.3 x 0.9 cm (series 2, image 51), and of the tip of the left lobe of the liver, hepatic segment 2 measuring 1.5 x 1.3 cm (series 2, image 47). No gallstones, gallbladder wall thickening, or biliary dilatation. Pancreas: Unremarkable. No pancreatic ductal dilatation or surrounding inflammatory changes. Spleen: Normal in size without significant abnormality. Adrenals/Urinary Tract: Adrenal glands are unremarkable. Kidneys are normal, without renal calculi, solid lesion, or hydronephrosis. Bladder is unremarkable. Stomach/Bowel: Stomach is within normal limits. Appendix appears normal. There is a large, concentric mass of the rectosigmoid junction measuring 7.8 x 5.9 x 5.2 cm (series 2, 104 series 6, image 84). Vascular/Lymphatic: Aortic atherosclerosis. Multiple enlarged perirectal lymph nodes measuring up to 1.0 x  0.9 cm (series 2, 106). Enlarged bilateral pelvic sidewall lymph nodes measuring to 2.5 x 1.9 cm on the left (series 2, image 110). Numerous prominent retroperitoneal lymph nodes, largest left retroperitoneal nodes measuring up to 1.4 x 1.4 cm (series 4, image 69). Reproductive: No mass or other abnormality. Other: No abdominal wall hernia or abnormality. No abdominopelvic ascites. Musculoskeletal: No acute or significant osseous findings. IMPRESSION: 1. There is a large, concentric mass of the rectosigmoid junction measuring 7.8 x 5.9 x 5.2 cm. Findings are consistent with primary colon malignancy identified by colonoscopy. 2. Multiple enlarged perirectal, bilateral pelvic sidewall, and retroperitoneal lymph nodes, consistent with nodal metastatic disease. 3. There are multiple small low-attenuation lesions of the liver parenchyma, incompletely characterized although suspicious for hepatic metastatic  disease. PET-CT or multiphasic contrast enhanced MRI could be used to more clearly characterize. 4. There are multiple small bilateral pulmonary nodules, measuring 3 mm and smaller, nonspecific although highly suspicious for early pulmonary metastatic disease given overall constellation of findings. Aortic Atherosclerosis (ICD10-I70.0). Electronically Signed   By: Eddie Candle M.D.   On: 02/10/2021 15:51    ASSESSMENT & PLAN:  Angela Mcpherson is a 60 y.o. Caucasian female with a history of Heart murmur, presents with frequent bowel movement, rectal pain and weight loss.  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 -I personally reviewed her staging CT scan images with patient and her husband, which showed local regional and RP nodal metastasis, and the multiple liver lesions which are highly suspicious for metastatic disease.  Scan also showed multiple small bilateral pulmonary nodules, indeterminate. -I recommend PET scan for further evaluation. -We will discuss her case in our GI tumor board, to see if IR can biopsy her liver lesion -We reviewed the natural history of colorectal cancer, and a small chance of cure for metastatic disease (usually with limited metastatic disease).  Unfortunately, given the bilobular liver metastasis and RP node metastasis, her disease is unlikely curable. -I discussed the treatment option, mainly with systemic treatment.  I discussed the common chemo regiment, such as FOLFOX, FOLFIRI, or FOLFOXFERI.  I discussed role of genomic testing, such as [employer] One, to see if she is a candidate for EGFR inhibitor, or immunotherapy. -We discussed that chemotherapy is not curative for metastatic colorectal cancer, the goal of therapy is to prolong her life and improve her quality of life. -We also discussed role of radiation, especially for  disease control and improve rectal symptoms -We discussed that upfront surgery is not indicated for metastatic colorectal cancer, unless she has bowel obstruction, significant bleeding, or perforation.  She is scheduled to see colorectal surgeon next week. -After lengthy discussion, patient is still reluctant to take chemo due to the concern of side effect, she is however interested in immunotherapy or targeted therapy if it is available. -We discussed port for chemotherapy -she will think about the above options and see me back after PET to finalize her treatment plan.   PLAN:  -PET scan in 1-2 weeks -Tumor board discussion, to see if liver biopsy is feasible -I plan to see her back after the above work-up, plan to start systemic chemotherapy in about 2 weeks if she is agreeable. -will request MMR and FO on her biopsy  -dietician and genetic referral    Orders Placed This Encounter  Procedures   NM PET Image Initial (PI) Skull Base To Thigh  Standing Status:   Future    Standing Expiration Date:   02/12/2022    Order Specific Question:   If indicated for the ordered procedure, I authorize the administration of a radiopharmaceutical per Radiology protocol    Answer:   Yes    Order Specific Question:   Is the patient pregnant?    Answer:   No    Order Specific Question:   Preferred imaging location?    Answer:   [facility]   Ambulatory Referral to [facility] Nutrition    Referral Priority:   Routine    Referral Type:   Consultation    Referral Reason:   Specialty Services Required    Number of Visits Requested:   1   Ambulatory referral to Genetics    Referral Priority:   Routine    Referral Type:   Consultation    Referral Reason:   Specialty Services Required    Number of Visits Requested:   1     All questions were answered. The patient knows to call the clinic with any problems, questions or concerns. The total time spent in the appointment was 60 minutes.     Truitt Merle,  MD 02/12/2021 9:15 AM  I, Joslyn Devon, am acting as scribe for Truitt Merle, MD.   I have reviewed the above documentation for accuracy and completeness, and I agree with the above.    Addendum I called patient today, to discuss her treatment plan.  She is very concerned about potential side effect from port, and the liver biopsy, and would like to hold on dose for now.  She is interested in quality of life, and would consider palliative radiation to the primary rectal tumor.  I will refer her to Dr. Lisbeth Renshaw.  I recommended her to consider systemic treatment Capox, she is interested.  She will think about if she starts systemic chemo, versus radiation first.  She has a PET scan scheduled for July 18, and a virtual visit with me a few days after.  I will call in Xeloda to [facility], to get it ready for her.  If the palliative radiation is more than 2 weeks, I would consider concurrent chemo with Xeloda.  Truitt Merle  02/14/2021

## 2021-02-11 NOTE — Progress Notes (Signed)
Med oncology referral made see alternate note dated 6/27

## 2021-02-11 NOTE — Progress Notes (Signed)
Spoke with patient regarding referral we received from Dr. Edison Nasuti to see medical oncology.  I have offered her a new patient consult with Dr. Truitt Merle tomorrow 02/12/2021 at 9:00 to arrive no later than 8:45 .  She is aware of our location and that one person may accompany her to this appointment.

## 2021-02-12 ENCOUNTER — Other Ambulatory Visit: Payer: Self-pay

## 2021-02-12 ENCOUNTER — Encounter: Payer: Self-pay | Admitting: Hematology

## 2021-02-12 ENCOUNTER — Inpatient Hospital Stay: Payer: 59 | Attending: Hematology | Admitting: Hematology

## 2021-02-12 DIAGNOSIS — C19 Malignant neoplasm of rectosigmoid junction: Secondary | ICD-10-CM | POA: Diagnosis not present

## 2021-02-12 DIAGNOSIS — R918 Other nonspecific abnormal finding of lung field: Secondary | ICD-10-CM | POA: Insufficient documentation

## 2021-02-12 DIAGNOSIS — C787 Secondary malignant neoplasm of liver and intrahepatic bile duct: Secondary | ICD-10-CM | POA: Diagnosis not present

## 2021-02-12 NOTE — Progress Notes (Signed)
I met with Mr and Mrs Marte during the initial consult with Dr Burr Medico.  I also met with them afterward and reviewed chemotherapy, nutrition, the nature of cancer, and reliable resources.  I showed them a port a cath and explained basic information. I explained that our scheduling department will call them with appts for nutrition consult and genetic consult. I told them that I will call them after I have scheduled her PET scan. I instructed them to bring in FMLA paperwork to [facility] for completion.  They will leave this at the reception desk for [place]. I encouraged them to call me with any questions or concerns.  I provided my direct phone number.  They verbalized understanding.

## 2021-02-13 ENCOUNTER — Ambulatory Visit: Payer: 59 | Admitting: Gastroenterology

## 2021-02-13 ENCOUNTER — Other Ambulatory Visit: Payer: Self-pay

## 2021-02-13 DIAGNOSIS — C19 Malignant neoplasm of rectosigmoid junction: Secondary | ICD-10-CM

## 2021-02-13 DIAGNOSIS — K769 Liver disease, unspecified: Secondary | ICD-10-CM

## 2021-02-13 NOTE — Progress Notes (Signed)
The proposed treatment discussed in conference is for discussion purpose only and is not a binding recommendation.  The patients have not been physically examined, or presented with their treatment options.  Therefore, final treatment plans cannot be decided.  

## 2021-02-13 NOTE — Progress Notes (Signed)
Angela Mcpherson called and stated she does not want to have a catheter placed.  She states she read on the [facility] clinic site that 1 in 3 people have complications and she does no want that

## 2021-02-13 NOTE — Progress Notes (Signed)
I spoke with Angela Mcpherson and relayed the recommendations from the GI conference held today.  They recommended PET scan, US guided liver biopsy, chemotherapy, molecular testing, and possible palliative radiation therapy for symptom relief.  Surgical resection was not recommended.  She verbalized understanding.  She stated she has decided to move forward with PET scan, port placement,  palliative radiation therapy and chemotherapy.  She stated she did not want the liver biopsy until after radiation therapy and chemotherapy.  I forwarded her decision to Dr. Burr Medico.

## 2021-02-14 ENCOUNTER — Telehealth: Payer: Self-pay | Admitting: Hematology

## 2021-02-14 ENCOUNTER — Telehealth: Payer: Self-pay | Admitting: Pharmacist

## 2021-02-14 ENCOUNTER — Other Ambulatory Visit (HOSPITAL_COMMUNITY): Payer: Self-pay

## 2021-02-14 DIAGNOSIS — C19 Malignant neoplasm of rectosigmoid junction: Secondary | ICD-10-CM

## 2021-02-14 MED ORDER — CAPECITABINE 500 MG PO TABS: ORAL_TABLET | ORAL | 1 refills | Status: DC

## 2021-02-14 MED ORDER — CAPECITABINE 500 MG PO TABS
ORAL_TABLET | ORAL | 1 refills | Status: DC
  Filled 2021-02-14: qty 75, fill #0

## 2021-02-14 NOTE — Telephone Encounter (Addendum)
Oral Oncology Pharmacist Encounter  Received new prescription for Xeloda (capecitabine) for the treatment of metastatic colorectal cancer. This will be used in either combination with radiation for the duration of radiation OR oxaliplatin - patient currently undecided and will know by next office visit on 03/06/21.  Prescription dose and frequency assessed for appropriateness.  CBC w/ Diff and CMP from 02/04/21 assessed, no baseline dose adjustments required.  Current medication list in Epic reviewed, no relevant/significant DDIs with Xeloda identified.  Evaluated chart and no patient barriers to medication adherence noted.   Patient's insurance requires prescription be filled through [employer] Rx redirected to pharmacy.   Oral Oncology Clinic will continue to follow for insurance authorization, copayment issues, initial counseling and start date.  Leron Croak, PharmD, BCPS Hematology/Oncology Clinical Pharmacist [facility] [phone] 02/14/2021 1:49 PM

## 2021-02-14 NOTE — Telephone Encounter (Signed)
Left message with follow-up appointment per 6/28 los.

## 2021-02-14 NOTE — Addendum Note (Signed)
Addended by: Truitt Merle on: 02/14/2021 01:33 PM   Modules accepted: Orders

## 2021-02-14 NOTE — Telephone Encounter (Signed)
Scheduled appts per 6/28 sch msg. Pt aware.

## 2021-02-15 ENCOUNTER — Encounter (HOSPITAL_COMMUNITY): Payer: Self-pay

## 2021-02-15 ENCOUNTER — Telehealth (HOSPITAL_COMMUNITY): Payer: Self-pay

## 2021-02-15 NOTE — Progress Notes (Signed)
       Patient Demographics  Patient Name  Angela Mcpherson, Angela Mcpherson Legal Sex  Female DOB  Apr 29, 1961 SSN  [id number] Address  [place] [address] Phone  [phone] [facility])  [phone] (Mobile)     RE: Biopsy Received: Scarlette Ar, MD  Lennox Solders E Approved for contrast enhanced ultrasound guided liver mass biopsy.   Dylan         Previous Messages    ----- Message -----  From: Lenore Cordia  Sent: 02/13/2021   3:58 PM EDT  To: Ir Procedure Requests  Subject: Biopsy                                         Procedure Requested: US Biopsy Liver    Reason for Procedure: invasive colon adenocarcinoma, liver lesion    Provider Requesting: Dr Burr Medico  Provider Telephone:  [phone]    Future Exam: NM PET July 18

## 2021-02-22 ENCOUNTER — Other Ambulatory Visit: Payer: Self-pay | Admitting: Licensed Clinical Social Worker

## 2021-02-22 DIAGNOSIS — C19 Malignant neoplasm of rectosigmoid junction: Secondary | ICD-10-CM

## 2021-02-25 NOTE — Progress Notes (Signed)
GI Location of Tumor / Histology: rectosigmoid Junction- Colon and Rectum  Angela Mcpherson presented win increased bowel movements and rectal pain for the past 10-12 months.   PET 03/04/2021:  CT CAP 02/08/2021: There is a large, concentric mass of the rectosigmoid junction measuring 7.8 x 5.9 x 5.2 cm. Findings are consistent with primary colon malignancy identified by colonoscopy.  Multiple enlarged perirectal, bilateral pelvic sidewall, and retroperitoneal lymph nodes, consistent with nodal metastatic disease.  There are multiple small low-attenuation lesions of the liver parenchyma, incompletely characterized although suspicious for hepatic metastatic disease.  There are multiple small bilateral pulmonary nodules, measuring 3 mm and smaller, nonspecific although highly suspicious for early pulmonary metastatic disease given overall constellation of findings.   Colonoscopy 02/04/2021: Two 2 to 6 mm polyps in the sigmoid colon and in the cecum, removed with a cold snare.  Resected and retrieved. - A fungating, infiltrative and ulcerated partially obstructing mass was found in the rectosigmoid colon (distal edge about 7cm from the anus). The mass was partially circumferential (involving one-half of the lumen circumference). The mass measured eight cm in length.  Biopsies of Colon Mass 02/04/2021    Past/Anticipated interventions by surgeon, if any:    Past/Anticipated interventions by medical oncology, if any:  Dr. Burr Medico 02/12/2021 -We will discuss her case in our GI tumor board, to see if IR can biopsy her liver lesion -We reviewed the natural history of colorectal cancer, and a small chance of cure for metastatic disease (usually with limited metastatic disease).  Unfortunately, given the bilobular liver metastasis and RP node metastasis, her disease is unlikely curable. --We discussed that chemotherapy is not curative for metastatic colorectal cancer, the goal of therapy is to prolong her life  and improve her quality of life. -We also discussed role of radiation, especially for disease control and improve rectal symptoms -We discussed that upfront surgery is not indicated for metastatic colorectal cancer, unless she has bowel obstruction, significant bleeding, or perforation.  She is scheduled to see colorectal surgeon next week. -After lengthy discussion, patient is still reluctant to take chemo due to the concern of side effect, she is however interested in immunotherapy or targeted therapy if it is available. -Follow-up 03/06/2021   Weight changes, if any: Lost about 20 pounds in the past year.  Bowel/Bladder complaints, if any: Feels like there is a blockage in her rectum.  Taking miralax and eating foods to keep the bowels soft.  Has bowel frequency, occasionally passing only mucus.  She notes that her bowels have to move right after eating and sometimes while eating.    Nausea / Vomiting, if any:   Pain issues, if any:  Has pain most of the time, wakes her up at night on occasions.  She takes ibuprofen and tylenol and warm baths to relieve the gas.  She is taking gas x as well with little relief.  Any blood per rectum:  None.   SAFETY ISSUES: Prior radiation? No Pacemaker/ICD? No Possible current pregnancy? Postmenopausal Is the patient on methotrexate? no  Current Complaints/Details: -Genetics 03/04/2021

## 2021-02-26 ENCOUNTER — Encounter: Payer: Self-pay | Admitting: *Deleted

## 2021-02-26 ENCOUNTER — Other Ambulatory Visit: Payer: Self-pay

## 2021-02-26 ENCOUNTER — Ambulatory Visit
Admission: RE | Admit: 2021-02-26 | Discharge: 2021-02-26 | Disposition: A | Discharge status: Home / Self Care | Payer: 59 | Source: Ambulatory Visit | Attending: Radiation Oncology | Admitting: Radiation Oncology

## 2021-02-26 ENCOUNTER — Encounter: Payer: Self-pay | Admitting: Radiation Oncology

## 2021-02-26 VITALS — Ht 63.0 in | Wt 114.0 lb

## 2021-02-26 DIAGNOSIS — C19 Malignant neoplasm of rectosigmoid junction: Secondary | ICD-10-CM

## 2021-02-26 HISTORY — DX: Malignant neoplasm of rectosigmoid junction: C19

## 2021-02-26 NOTE — Progress Notes (Signed)
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   HISTORY OF PRESENT ILLNESS: Corean Yoshimura is a 60 y.o. female seen at the request of Dr. Burr Medico for a new diagnosis of rectosigmoid carcinoma. The patient noted pain in the rectum for the last year, it was thought to be related to hemorrhoids and she did not seek medical attention until she noticed blood in her stool  in October 2021.  She was encouraged to be evaluated by gastroenterology.  She did not present in the GI clinic until April 2022 and underwent endoscopy on 02/04/2021 which showed a large ulcerative and partially obstructing mass in the rectosigmoid colon that was 7 cm from the anal verge partially circumferential and measuring 8 cm in length polyps in the sigmoid and cecal part of the colon were also removed and retrieved and were negative for carcinoma but her rectal mass that was biopsied was consistent with adenocarcinoma she underwent CT scan of the chest abdomen and pelvis on 02/08/2021 and a large concentric mass in the rectosigmoid junction measuring up to 7.8 cm was identified with multiple enlarged perirectal, bilateral pelvic sidewall and retroperitoneal nodes concerning for metastatic disease as well as multiple small lesions in the liver suspicious for metastatic disease and multiple small pulmonary nodules measuring 3 mm and smaller that were also highly suspicious for early metastatic disease.  She met with Dr. Burr Medico and is planning to undergo a PET scan next Monday.  She is seen to discuss palliative options of radiotherapy.    PREVIOUS RADIATION THERAPY: No   PAST MEDICAL HISTORY:  Past Medical History:  Diagnosis Date   Colorectal cancer ([place])     Heart murmur    dx at 26   MVP is stable   Hemorrhoids        PAST SURGICAL HISTORY: Past Surgical History:  Procedure Laterality Date   NO PAST SURGERIES       FAMILY HISTORY:  Family History  Problem Relation Age of Onset   Cancer Father        prostate cancer   Cancer Maternal Grandfather        gastric cancer   Stomach cancer Maternal Grandfather 52   Colon cancer Neg Hx    Colon polyps Neg Hx    Esophageal cancer Neg Hx    Rectal cancer Neg Hx      SOCIAL HISTORY:  reports that she has been smoking cigarettes. She has a 26.00 pack-year smoking history. She has never used smokeless tobacco. She reports previous alcohol use. She reports that she does not use drugs.  The patient is married and lives in [place].   ALLERGIES: Codeine, Epinephrine (anaphylaxis), Orange fruit [citrus], Streptogramins, and Tomato   MEDICATIONS:  Current Outpatient Medications  Medication Sig Dispense Refill   acetaminophen (TYLENOL) 325 MG tablet Take 650 mg by mouth every 6 (six) hours as needed.     cholecalciferol (VITAMIN D3) 25 MCG (1000 UNIT) tablet Take 1,000 Units by mouth daily.     ibuprofen (ADVIL) 200 MG tablet Take 200 mg by mouth every 6 (six) hours as needed.     VALERIAN ROOT PO Take by mouth.  vitamin C (ASCORBIC ACID) 500 MG tablet Take 500 mg by mouth daily.     capecitabine (XELODA) 500 MG tablet Take 2 tablets (1000 mg total) by mouth in morning and 3 tablets (1500 mg total) by mouth in evening. Take within 30 minutes after meals. Take only on days of radiation, Monday through Friday. (Patient not taking: Reported on 02/26/2021) 75 tablet 1   hydrocortisone 2.5 % cream Apply to anal canal twice daily as directed.     phenylephrine-shark liver oil-mineral oil-petrolatum (PREPARATION H) 0.25-14-74.9 % rectal ointment Place 1 application rectally as needed for hemorrhoids.     psyllium (METAMUCIL) 0.52 g capsule Take 0.52 g by mouth daily. (Patient not  taking: Reported on 02/04/2021)     No current facility-administered medications for this encounter.     REVIEW OF SYSTEMS: On review of systems, the patient reports that she is having alternating bowel symptoms with constipation, looseness, and mucous production with fecal urgency. She has rectal pain and fullness as well as bleeding with some bowel movements. No other complaints are verbalized.      PHYSICAL EXAM:  Wt Readings from Last 3 Encounters:  02/26/21 114 lb (51.7 kg)  02/12/21 114 lb 12.8 oz (52.1 kg)  02/04/21 123 lb (55.8 kg)    Pain Assessment Pain Score: 7  Pain Loc: Abdomen/10  In general this is a well appearing Caucasian in no acute distress. She's alert and oriented x4 and appropriate throughout the examination. Cardiopulmonary assessment is negative for acute distress and she exhibits normal effort.     ECOG = 1  0 - Asymptomatic (Fully active, able to carry on all predisease activities without restriction)  1 - Symptomatic but completely ambulatory (Restricted in physically strenuous activity but ambulatory and able to carry out work of a light or sedentary nature. For example, light housework, office work)  2 - Symptomatic, <50% in bed during the day (Ambulatory and capable of all self care but unable to carry out any work activities. Up and about more than 50% of waking hours)  3 - Symptomatic, >50% in bed, but not bedbound (Capable of only limited self-care, confined to bed or chair 50% or more of waking hours)  4 - Bedbound (Completely disabled. Cannot carry on any self-care. Totally confined to bed or chair)  5 - Death   Eustace Pen MM, Creech RH, Tormey DC, et al. [phone]). "Toxicity and response criteria of the [facility]". [place] Oncol. 5 (6): 649-55    LABORATORY DATA:  Lab Results  Component Value Date   WBC 10.6 (H) 02/04/2021   HGB 14.8 02/04/2021   HCT 43.5 02/04/2021   MCV 90.8 02/04/2021   PLT 186.0 02/04/2021    Lab Results  Component Value Date   NA 139 02/04/2021   K 4.1 02/04/2021   CL 105 02/04/2021   CO2 23 02/04/2021   Lab Results  Component Value Date   ALT 71 (H) 02/04/2021   AST 48 (H) 02/04/2021   ALKPHOS 100 02/04/2021   BILITOT 0.8 02/04/2021      RADIOGRAPHY: CT CHEST W CONTRAST  Result Date: 02/10/2021 CLINICAL DATA:  Colon mass identified by colonoscopy, weight loss EXAM: CT CHEST, ABDOMEN, AND PELVIS WITH CONTRAST TECHNIQUE: Multidetector CT imaging of the chest, abdomen and pelvis was performed following the standard protocol during bolus administration of intravenous contrast. CONTRAST:  110m OMNIPAQUE IOHEXOL 300 MG/ML SOLN, additional oral enteric contrast COMPARISON:  None. FINDINGS: CT CHEST FINDINGS Cardiovascular: No  significant vascular findings. Normal heart size. No pericardial effusion. Mediastinum/Nodes: No enlarged mediastinal, hilar, or axillary lymph nodes. Thyroid gland, trachea, and esophagus demonstrate no significant findings. Lungs/Pleura: There are multiple small bilateral pulmonary nodules, for example a 3 mm nodule of the right lower lobe (series 7, image 88), a 3 mm nodule of the peripheral right upper lobe (series 7, image 4), and a 2 mm nodule left lower lobe (series 7, image 94). No pleural effusion or pneumothorax. Musculoskeletal: No chest wall mass or suspicious bone lesions identified. CT ABDOMEN PELVIS FINDINGS Hepatobiliary: There are multiple small low-attenuation lesions of the liver parenchyma, including of the lateral right lobe, hepatic segment VI, measuring 0.6 cm (series 2, image 64), of the posterior liver dome, hepatic segment VII, measuring 1.3 x 0.9 cm (series 2, image 51), and of the tip of the left lobe of the liver, hepatic segment 2 measuring 1.5 x 1.3 cm (series 2, image 47). No gallstones, gallbladder wall thickening, or biliary dilatation. Pancreas: Unremarkable. No pancreatic ductal dilatation or surrounding inflammatory changes.  Spleen: Normal in size without significant abnormality. Adrenals/Urinary Tract: Adrenal glands are unremarkable. Kidneys are normal, without renal calculi, solid lesion, or hydronephrosis. Bladder is unremarkable. Stomach/Bowel: Stomach is within normal limits. Appendix appears normal. There is a large, concentric mass of the rectosigmoid junction measuring 7.8 x 5.9 x 5.2 cm (series 2, 104 series 6, image 84). Vascular/Lymphatic: Aortic atherosclerosis. Multiple enlarged perirectal lymph nodes measuring up to 1.0 x 0.9 cm (series 2, 106). Enlarged bilateral pelvic sidewall lymph nodes measuring to 2.5 x 1.9 cm on the left (series 2, image 110). Numerous prominent retroperitoneal lymph nodes, largest left retroperitoneal nodes measuring up to 1.4 x 1.4 cm (series 4, image 69). Reproductive: No mass or other abnormality. Other: No abdominal wall hernia or abnormality. No abdominopelvic ascites. Musculoskeletal: No acute or significant osseous findings. IMPRESSION: 1. There is a large, concentric mass of the rectosigmoid junction measuring 7.8 x 5.9 x 5.2 cm. Findings are consistent with primary colon malignancy identified by colonoscopy. 2. Multiple enlarged perirectal, bilateral pelvic sidewall, and retroperitoneal lymph nodes, consistent with nodal metastatic disease. 3. There are multiple small low-attenuation lesions of the liver parenchyma, incompletely characterized although suspicious for hepatic metastatic disease. PET-CT or multiphasic contrast enhanced MRI could be used to more clearly characterize. 4. There are multiple small bilateral pulmonary nodules, measuring 3 mm and smaller, nonspecific although highly suspicious for early pulmonary metastatic disease given overall constellation of findings. Aortic Atherosclerosis (ICD10-I70.0). Electronically Signed   By: Eddie Candle M.D.   On: 02/10/2021 15:51   CT Abdomen Pelvis W Contrast  Result Date: 02/10/2021 CLINICAL DATA:  Colon mass identified by  colonoscopy, weight loss EXAM: CT CHEST, ABDOMEN, AND PELVIS WITH CONTRAST TECHNIQUE: Multidetector CT imaging of the chest, abdomen and pelvis was performed following the standard protocol during bolus administration of intravenous contrast. CONTRAST:  15mL OMNIPAQUE IOHEXOL 300 MG/ML SOLN, additional oral enteric contrast COMPARISON:  None. FINDINGS: CT CHEST FINDINGS Cardiovascular: No significant vascular findings. Normal heart size. No pericardial effusion. Mediastinum/Nodes: No enlarged mediastinal, hilar, or axillary lymph nodes. Thyroid gland, trachea, and esophagus demonstrate no significant findings. Lungs/Pleura: There are multiple small bilateral pulmonary nodules, for example a 3 mm nodule of the right lower lobe (series 7, image 88), a 3 mm nodule of the peripheral right upper lobe (series 7, image 4), and a 2 mm nodule left lower lobe (series 7, image 94). No pleural effusion or pneumothorax. Musculoskeletal: No chest wall mass  or suspicious bone lesions identified. CT ABDOMEN PELVIS FINDINGS Hepatobiliary: There are multiple small low-attenuation lesions of the liver parenchyma, including of the lateral right lobe, hepatic segment VI, measuring 0.6 cm (series 2, image 64), of the posterior liver dome, hepatic segment VII, measuring 1.3 x 0.9 cm (series 2, image 51), and of the tip of the left lobe of the liver, hepatic segment 2 measuring 1.5 x 1.3 cm (series 2, image 47). No gallstones, gallbladder wall thickening, or biliary dilatation. Pancreas: Unremarkable. No pancreatic ductal dilatation or surrounding inflammatory changes. Spleen: Normal in size without significant abnormality. Adrenals/Urinary Tract: Adrenal glands are unremarkable. Kidneys are normal, without renal calculi, solid lesion, or hydronephrosis. Bladder is unremarkable. Stomach/Bowel: Stomach is within normal limits. Appendix appears normal. There is a large, concentric mass of the rectosigmoid junction measuring 7.8 x 5.9 x 5.2  cm (series 2, 104 series 6, image 84). Vascular/Lymphatic: Aortic atherosclerosis. Multiple enlarged perirectal lymph nodes measuring up to 1.0 x 0.9 cm (series 2, 106). Enlarged bilateral pelvic sidewall lymph nodes measuring to 2.5 x 1.9 cm on the left (series 2, image 110). Numerous prominent retroperitoneal lymph nodes, largest left retroperitoneal nodes measuring up to 1.4 x 1.4 cm (series 4, image 69). Reproductive: No mass or other abnormality. Other: No abdominal wall hernia or abnormality. No abdominopelvic ascites. Musculoskeletal: No acute or significant osseous findings. IMPRESSION: 1. There is a large, concentric mass of the rectosigmoid junction measuring 7.8 x 5.9 x 5.2 cm. Findings are consistent with primary colon malignancy identified by colonoscopy. 2. Multiple enlarged perirectal, bilateral pelvic sidewall, and retroperitoneal lymph nodes, consistent with nodal metastatic disease. 3. There are multiple small low-attenuation lesions of the liver parenchyma, incompletely characterized although suspicious for hepatic metastatic disease. PET-CT or multiphasic contrast enhanced MRI could be used to more clearly characterize. 4. There are multiple small bilateral pulmonary nodules, measuring 3 mm and smaller, nonspecific although highly suspicious for early pulmonary metastatic disease given overall constellation of findings. Aortic Atherosclerosis (ICD10-I70.0). Electronically Signed   By: Eddie Candle M.D.   On: 02/10/2021 15:51       IMPRESSION/PLAN: 1. 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 Dr. Lisbeth Renshaw discusses the pathology findings and reviews the nature of rectal carcinoma. He discusses the imaging findings and concerns for likely spread of disease based on her CT scans.  We discussed the risks, benefits, short, and long term effects of radiotherapy, as well as the palliative intent, and the patient is interested in proceeding. Dr. Lisbeth Renshaw discusses the delivery and logistics of  radiotherapy and anticipates a course of 3 weeks of radiotherapy versus 5 1/2 weeks if her PET scan did not show metastatic disease. We will follow up with the results of her PET scan and discussion with Dr. Burr Medico. She was offered simulation but declines to make plans for radiation until she's had further conversation with Dr. Burr Medico. We will follow up with her next week to see how she'd like to proceed.   This encounter was provided by telemedicine platform MyChart.  The patient has provided two factor identification and has given verbal consent for this type of encounter and has been advised to only accept a meeting of this type in a secure network environment. The time spent during this encounter was 60 minutes including preparation, discussion, and coordination of the patient's care. The attendants for this meeting include Blenda Nicely, RN, Dr. Lisbeth Renshaw, Hayden Pedro  and [name].  During the encounter,  Blenda Nicely, RN, Dr. Lisbeth Renshaw, and  Hayden Pedro were located at [facility] Radiation Oncology Department.  Letecia Arps was located at home.    The above documentation reflects my direct findings during this shared patient visit. Please see the separate note by Dr. Lisbeth Renshaw on this date for the remainder of the patient's plan of care.    Carola Rhine, [facility]   **Disclaimer: This note was dictated with voice recognition software. Similar sounding words can inadvertently be transcribed and this note may contain transcription errors which may not have been corrected upon publication of note.**

## 2021-02-26 NOTE — Progress Notes (Signed)
[place] Psychosocial Distress Screening Clinical Social Work  Clinical Social Work was referred by distress screening protocol.  The patient scored a 6 on the Psychosocial Distress Thermometer which indicates moderate distress. Clinical Social Worker contacted patient by phone to assess for distress and other psychosocial needs.  Patient stated she was doing well and had no concerns at this time.  Patient stated her husband was diagnosed with cancer a week before she received her diagnosis.  Patient identified her husband as a positive support system, and they are approaching cancer with a positive mindset.  CSW and patient discussed common and appropriate feelings when being diagnosed with cancer and the importance of support.  CSW provided education on CSW role and support programs at [facility].  Patient requested to be added to the monthly support program calendar, and was appreciative of CSW contact.  CSW provided contact information and encouraged patient to call with questions or concerns.       ONCBCN DISTRESS SCREENING 02/26/2021  Screening Type Initial Screening  Distress experienced in past week (1-10) 6  Family Problem type Partner  Emotional problem type Nervousness/Anxiety;Adjusting to illness;Depression  Other Contact via phone   Angela Mcpherson, MSW, LCSW, OSW-C Clinical Social Worker [facility] [phone]

## 2021-02-26 NOTE — Progress Notes (Signed)
[place] Psychosocial Distress Screening Clinical Social Work  Clinical Social Work was referred by distress screening protocol.  The patient scored a 6 on the Psychosocial Distress Thermometer which indicates moderate distress. Clinical Social Worker contacted patient by phone to assess for distress and other psychosocial needs.  CSW was unable to reach patient and left a message offering support and information on support programs at [facility].  CSW provided contact information and encouraged patient to return call.    ONCBCN DISTRESS SCREENING 02/26/2021  Screening Type Initial Screening  Distress experienced in past week (1-10) 6  Family Problem type Partner  Emotional problem type Nervousness/Anxiety;Adjusting to illness;Depression  Other Contact via phone     Angela Mcpherson, MSW, LCSW, OSW-C Clinical Social Worker [facility] [phone]

## 2021-03-04 ENCOUNTER — Encounter: Payer: Self-pay | Admitting: Licensed Clinical Social Worker

## 2021-03-04 ENCOUNTER — Ambulatory Visit (HOSPITAL_COMMUNITY)
Admission: RE | Admit: 2021-03-04 | Discharge: 2021-03-04 | Disposition: A | Discharge status: Home / Self Care | Payer: 59 | Source: Ambulatory Visit | Attending: Hematology | Admitting: Hematology

## 2021-03-04 ENCOUNTER — Inpatient Hospital Stay: Payer: 59

## 2021-03-04 ENCOUNTER — Inpatient Hospital Stay: Payer: 59 | Attending: Hematology | Admitting: Nutrition

## 2021-03-04 ENCOUNTER — Inpatient Hospital Stay (HOSPITAL_BASED_OUTPATIENT_CLINIC_OR_DEPARTMENT_OTHER): Payer: 59 | Admitting: Licensed Clinical Social Worker

## 2021-03-04 ENCOUNTER — Other Ambulatory Visit: Payer: Self-pay

## 2021-03-04 DIAGNOSIS — Z8042 Family history of malignant neoplasm of prostate: Secondary | ICD-10-CM | POA: Insufficient documentation

## 2021-03-04 DIAGNOSIS — C19 Malignant neoplasm of rectosigmoid junction: Secondary | ICD-10-CM | POA: Insufficient documentation

## 2021-03-04 DIAGNOSIS — Z5112 Encounter for antineoplastic immunotherapy: Secondary | ICD-10-CM | POA: Insufficient documentation

## 2021-03-04 DIAGNOSIS — Z5111 Encounter for antineoplastic chemotherapy: Secondary | ICD-10-CM | POA: Insufficient documentation

## 2021-03-04 DIAGNOSIS — C787 Secondary malignant neoplasm of liver and intrahepatic bile duct: Secondary | ICD-10-CM | POA: Insufficient documentation

## 2021-03-04 DIAGNOSIS — Z800 Family history of malignant neoplasm of digestive organs: Secondary | ICD-10-CM | POA: Insufficient documentation

## 2021-03-04 DIAGNOSIS — Z808 Family history of malignant neoplasm of other organs or systems: Secondary | ICD-10-CM | POA: Insufficient documentation

## 2021-03-04 LAB — GLUCOSE, CAPILLARY: Glucose-Capillary: 107 mg/dL — ABNORMAL HIGH (ref 70–99)

## 2021-03-04 LAB — GENETIC SCREENING ORDER

## 2021-03-04 MED ORDER — FLUDEOXYGLUCOSE F - 18 (FDG) INJECTION
5.6100 | Freq: Once | INTRAVENOUS | Status: AC | PRN
  Administered 2021-03-04: 5.61 via INTRAVENOUS

## 2021-03-04 NOTE — Progress Notes (Signed)
60 year old female diagnosed with metastatic colon cancer followed by Dr. Burr Medico.  Patient is still deciding on treatment plan.  Past medical history not significant.  Medications include vitamin D, Metamucil, valerian root, vitamin C.  Labs include glucose 110 on June 20.  Height: 63 inches. Weight: 110.2 pounds. Usual body weight: 123 pounds June 20. BMI: 19.52.  Patient reports her appetite is good.  She states she tries to eat what she thinks is a healthy healthy diet.  Reports bowels are not consistent.  Sometimes she has frequent soft bowel movements and sometimes her bowels move all day. Patient endorses weight loss.  Reports usual body weight of 135 pounds.  This is an 18% weight loss from usual body weight.  Patient has lost 10% of her body weight over 1 month which is significant.  Nutrition diagnosis: Unintended weight loss related to metastatic colon cancer as evidenced by 18% weight loss from usual body weight.  Intervention: Patient educated to consume small, frequent meals and snacks with high-calorie, high-protein foods as tolerated. Educated patient on low fiber diet.  Provided nutrition fact sheet. Recommended oral nutrition supplements and provided samples of clear nutrition supplements and Ensure complete. Recommended patient try Enterade and provided samples.  Also discussed purchasing information. Questions were answered.  Teach back method used.  Contact information given.  Monitoring, evaluation, goals: Patient will tolerate increased calories and protein to minimize further weight loss.  Next visit: To be scheduled with upcoming treatment.  **Disclaimer: This note was dictated with voice recognition software. Similar sounding words can inadvertently be transcribed and this note may contain transcription errors which may not have been corrected upon publication of note.**

## 2021-03-04 NOTE — Progress Notes (Signed)
REFERRING PROVIDER: Truitt Merle, MD [place],  [place] [address]  PRIMARY PROVIDER:  Pcp, No  PRIMARY REASON FOR VISIT:  1. Malignant neoplasm of rectosigmoid junction ([place])   2. Family history of stomach cancer   3. Family history of brain cancer   4. Family history of prostate cancer      HISTORY OF PRESENT ILLNESS:   Angela Mcpherson, a 60 y.o. female, was seen for a [place] cancer genetics consultation at the request of Dr. Burr Medico due to a personal and family history of cancer.  Angela Mcpherson presents to clinic today to discuss the possibility of a hereditary predisposition to cancer, genetic testing, and to further clarify her future cancer risks, as well as potential cancer risks for family members.   In 2022, at the age of 29, [name] was diagnosed with invasive adenocarcinoma of the rectosigmoid junction, stage IV, MMR normal. The treatment plan includes chemotherapy.   CANCER HISTORY:  Oncology History Overview Note  Cancer Staging Malignant neoplasm of rectosigmoid junction [facility]) Staging form: Colon and Rectum, AJCC 8th Edition - Clinical stage from 02/04/2021: Stage IVB (cTX, cN2, cM1b) - Signed by Truitt Merle, MD on 02/12/2021 Stage prefix: Initial diagnosis    Malignant neoplasm of rectosigmoid junction ([place])  02/04/2021 Procedure   Colonoscopy by Dr Ardis Hughs 02/04/21 IMPRESSION - Two 2 to 6 mm polyps in the sigmoid colon and in the cecum, removed with a cold snare. Resected and retrieved. - A fungating, infiltrative and ulcerated partially obstructing mass was found in the rectosigmoid colon (distal edge about 7cm from the anus). The mass was partially circumferential (involving one-half of the lumen circumference). The mass measured eight cm in length. This was biopsied with a cold forceps for histology. The distal edge of the mass was tattooed with two submucosal injections of Spot (carbon black). Sigmoid colon felt somewhat fixed in place (large CRC or  perhaps invasion into colon from other primary site?) - Internal hemorrhoids. - The examination was otherwise normal on direct and retroflexion views.   02/04/2021 Pathology Results   Diagnosis 1. 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 - NO HIGH-GRADE DYSPLASIA OR CARCINOMA. 2. Colon, biopsy, rectosigmoid - INVASIVE ADENOCARCINOMA. - SEE NOTE.   02/04/2021 Cancer Staging   Staging form: Colon and Rectum, AJCC 8th Edition - Clinical stage from 02/04/2021: Stage IVB (cTX, cN2, cM1b) - Signed by Truitt Merle, MD on 02/12/2021  Stage prefix: Initial diagnosis    02/08/2021 Imaging   CT CHEST, ABDOMEN AND PELVIS W CONTRAST  IMPRESSION: 1. There is a large, concentric mass of the rectosigmoid junction measuring 7.8 x 5.9 x 5.2 cm. Findings are consistent with primary colon malignancy identified by colonoscopy. 2. Multiple enlarged perirectal, bilateral pelvic sidewall, and retroperitoneal lymph nodes, consistent with nodal metastatic disease. 3. There are multiple small low-attenuation lesions of the liver parenchyma, incompletely characterized although suspicious for hepatic metastatic disease. PET-CT or multiphasic contrast enhanced MRI could be used to more clearly characterize. 4. There are multiple small bilateral pulmonary nodules, measuring 3 mm and smaller, nonspecific although highly suspicious for early pulmonary metastatic disease given overall constellation of findings.   02/12/2021 Initial Diagnosis   Malignant neoplasm of rectosigmoid junction ([facility])      RISK FACTORS:  Menarche was at age 58.  OCP use for "short time" Ovaries intact: yes.  Hysterectomy: no.  Menopausal status: postmenopausal.  HRT use: 0 years. Colonoscopy: yes;  2 polyps + invasive adenocarcinoma .  Mammogram within the last year: no. Number of breast biopsies: 0.   Past Medical History:  Diagnosis Date   Colorectal cancer ([place])    Family history of brain cancer     Family history of prostate cancer    Family history of stomach cancer    Heart murmur    dx at 26   MVP is stable   Hemorrhoids     Past Surgical History:  Procedure Laterality Date   NO PAST SURGERIES      Social History   Socioeconomic History   Marital status: Married    Spouse name: Not on file   Number of children: 0   Years of education: Not on file   Highest education level: Not on file  Occupational History   Not on file  Tobacco Use   Smoking status: Every Day    Packs/day: 1.00    Years: 26.00    Pack years: 26.00    Types: Cigarettes   Smokeless tobacco: Never  Vaping Use   Vaping Use: Never used  Substance and Sexual Activity   Alcohol use: Not Currently    Comment: used to drink moderately   Drug use: Never   Sexual activity: Not on file  Other Topics Concern   Not on file  Social History Narrative   Not on file   Social Determinants of Health   Financial Resource Strain: Not on file  Food Insecurity: Not on file  Transportation Needs: Not on file  Physical Activity: Not on file  Stress: Not on file  Social Connections: Not on file     FAMILY HISTORY:  We obtained a detailed, 4-generation family history.  Significant diagnoses are listed below: Family History  Problem Relation Age of Onset   Prostate cancer Father    Brain cancer Maternal Aunt    Prostate cancer Paternal Uncle    Stomach cancer Maternal Grandfather 59   Colon cancer Neg Hx    Colon polyps Neg Hx    Esophageal cancer Neg Hx    Rectal cancer Neg Hx    Angela Mcpherson does not have children. She has 6 full brothers, and several maternal half sisters/brothers. None have had cancer.  [name] mother possibly had breast cancer in her 1s but she is not sure, she died at 86. Patient had 9 maternal aunts/uncles. One aunt had brain cancer, unknown age of diagnosis/death. No other known cancers in maternal aunts/uncles/cousins. Maternal grandfather had stomach cancer at 68 and died at  69. Maternal grandmother died in her 31s.  [name] father had prostate cancer in his 11s and died at 63. Patient had 11 paternal aunts/uncles. One uncle also had prostate cancer. No other known cancers in paternal aunts/uncles/cousins/grandparents, although she notes her grandmother died of "female issues" and is not sure what this meant.   Ms. Denman is unaware of previous family history of genetic testing for hereditary cancer risks. Patient's maternal ancestors are of Scotch/Irish/Italian descent, and paternal ancestors are of Scotch/Irish/Italian descent. There is no reported Ashkenazi Jewish ancestry. There is no known consanguinity.     GENETIC COUNSELING ASSESSMENT: Ms. Franta is a 60 y.o. female with a personal history and family history of cancer which is somewhat suggestive of a hereditary cancer syndrome and predisposition to cancer. We, therefore, discussed and recommended the following at today's visit.   DISCUSSION: We discussed that approximately 5-10% of colon cancer is hereditary. Most cases of hereditary colon cancer are associated with Lynch syndrome genes,  although there are other genes associated with hereditary colorectal cancer as well as other genes associated with other types of cancers we see in her family such as prostate and brain. Cancers and risks are gene specific.  We discussed that testing is beneficial for several reasons including knowing about other cancer risks, identifying potential screening and risk-reduction options that may be appropriate, and to understand if other family members could be at risk for cancer and allow them to undergo genetic testing.   We reviewed the characteristics, features and inheritance patterns of hereditary cancer syndromes. We also discussed genetic testing, including the appropriate family members to test, the process of testing, insurance coverage and turn-around-time for results. We discussed the implications of a negative,  positive and/or variant of uncertain significant result. We recommended Ms. Turnipseed pursue genetic testing for the [facility] Multi-Cancer+RNA gene panel.   The Multi-Cancer Panel + RNA offered by [facility] includes sequencing and/or deletion duplication testing of the following 84 genes: AIP, ALK, APC, ATM, AXIN2,BAP1,  BARD1, BLM, BMPR1A, BRCA1, BRCA2, BRIP1, CASR, CDC73, CDH1, CDK4, CDKN1B, CDKN1C, CDKN2A (p14ARF), CDKN2A (p16INK4a), CEBPA, CHEK2, CTNNA1, DICER1, DIS3L2, EGFR (c.2369C>T, p.Thr790Met variant only), EPCAM (Deletion/duplication testing only), FH, FLCN, GATA2, GPC3, GREM1 (Promoter region deletion/duplication testing only), HOXB13 (c.251G>A, p.Gly84Glu), HRAS, KIT, MAX, MEN1, MET, MITF (c.952G>A, p.Glu318Lys variant only), MLH1, MSH2, MSH3, MSH6, MUTYH, NBN, NF1, NF2, NTHL1, PALB2, PDGFRA, PHOX2B, PMS2, POLD1, POLE, POT1, PRKAR1A, PTCH1, PTEN, RAD50, RAD51C, RAD51D, RB1, RECQL4, RET, RUNX1, SDHAF2, SDHA (sequence changes only), SDHB, SDHC, SDHD, SMAD4, SMARCA4, SMARCB1, SMARCE1, STK11, SUFU, TERC, TERT, TMEM127, TP53, TSC1, TSC2, VHL, WRN and WT1.   Based on Ms. Shook's personal and family history of cancer, she meets medical criteria for genetic testing. Despite that she meets criteria, she may still have an out of pocket cost. We discussed that if her out of pocket cost for testing is over $100, the laboratory will call and confirm whether she wants to proceed with testing.  If the out of pocket cost of testing is less than $100 she will be billed by the genetic testing laboratory.   PLAN: After considering the risks, benefits, and limitations, Ms. Shan provided informed consent to pursue genetic testing and the blood sample was sent to [facility] for analysis of the Multi-Cancer+RNA panel. Results should be available within approximately 2-3 weeks' time, at which point they will be disclosed by telephone to Ms. Polio, as will any additional recommendations warranted by these results. Ms.  Jutras will receive a summary of her genetic counseling visit and a copy of her results once available. This information will also be available in Epic.   Ms. Douglas questions were answered to her satisfaction today. Our contact information was provided should additional questions or concerns arise. Thank you for the referral and allowing [place] to share in the care of your patient.   Faith Rogue, MS, [facility] Genetic Counselor [email or web address] Phone: [phone]  The patient was seen for a total of 40 minutes in face-to-face genetic counseling.  Patient brought her husband, Zenia Resides. Student Ralph Leyden observed this case. Drs. Gudena/Feng/Magrinat were available for discussion regarding this case.   _______________________________________________________________________ For Office Staff:  Number of people involved in session: 3 Was an Intern/ student involved with case: yes; student Alex observed

## 2021-03-06 ENCOUNTER — Encounter: Payer: Self-pay | Admitting: Hematology

## 2021-03-06 ENCOUNTER — Inpatient Hospital Stay (HOSPITAL_BASED_OUTPATIENT_CLINIC_OR_DEPARTMENT_OTHER): Payer: 59 | Admitting: Hematology

## 2021-03-06 DIAGNOSIS — C19 Malignant neoplasm of rectosigmoid junction: Secondary | ICD-10-CM

## 2021-03-06 MED ORDER — CAPECITABINE 500 MG PO TABS: ORAL_TABLET | ORAL | 0 refills | Status: DC

## 2021-03-06 MED ORDER — ONDANSETRON HCL 8 MG PO TABS: 8.0000 mg | ORAL_TABLET | Freq: Two times a day (BID) | ORAL | 1 refills | Status: DC | PRN

## 2021-03-06 MED ORDER — PROCHLORPERAZINE MALEATE 10 MG PO TABS: 10.0000 mg | ORAL_TABLET | Freq: Four times a day (QID) | ORAL | 1 refills | Status: DC | PRN

## 2021-03-06 MED ORDER — TRAMADOL HCL 50 MG PO TABS: 25.0000 mg | ORAL_TABLET | Freq: Three times a day (TID) | ORAL | 0 refills | Status: DC | PRN

## 2021-03-06 NOTE — Progress Notes (Signed)
[place]   Telephone:([phone]) [phone] Fax:(336) [phone]   Clinic Follow up Note   Patient Care Team: Pcp, No as PCP - General Truitt Merle, MD as Consulting Physician (Oncology) Milus Banister, MD as Attending Physician (Gastroenterology) Kyung Rudd, MD as Consulting Physician (Radiation Oncology)  Date of Service:  03/06/2021  I connected with Angela Mcpherson on 03/06/2021 at  1:20 PM EDT by video enabled telemedicine visit and verified that I am speaking with the correct person using two identifiers.  I discussed the limitations, risks, security and privacy concerns of performing an evaluation and management service by telephone and the availability of in person appointments. I also discussed with the patient that there may be a patient responsible charge related to this service. The patient expressed understanding and agreed to proceed.   Other persons participating in the visit and their role in the encounter:  pt's husband   Patient's location:  home  Provider's location:  Office   CHIEF COMPLAINT: f/u of metastatic colon cancer  SUMMARY OF ONCOLOGIC HISTORY: Oncology History Overview Note  Cancer Staging Malignant neoplasm of rectosigmoid junction ([place]) Staging form: Colon and Rectum, AJCC 8th Edition - Clinical stage from 02/04/2021: Stage IVB (cTX, cN2, cM1b) - Signed by Truitt Merle, MD on 02/12/2021 Stage prefix: Initial diagnosis    Malignant neoplasm of rectosigmoid junction ([place])  02/04/2021 Procedure   Colonoscopy by Dr Ardis Hughs 02/04/21 IMPRESSION - Two 2 to 6 mm polyps in the sigmoid colon and in the cecum, removed with a cold snare. Resected and retrieved. - A fungating, infiltrative and ulcerated partially obstructing mass was found in the rectosigmoid colon (distal edge about 7cm from the anus). The mass was partially circumferential (involving one-half of the lumen circumference). The mass measured eight cm in length. This was biopsied with a cold  forceps for histology. The distal edge of the mass was tattooed with two submucosal injections of Spot (carbon black). Sigmoid colon felt somewhat fixed in place (large CRC or perhaps invasion into colon from other primary site?) - Internal hemorrhoids. - The examination was otherwise normal on direct and retroflexion views.   02/04/2021 Pathology Results   Diagnosis 1. 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 - NO HIGH-GRADE DYSPLASIA OR CARCINOMA. 2. Colon, biopsy, rectosigmoid - INVASIVE ADENOCARCINOMA. - SEE NOTE.   02/04/2021 Cancer Staging   Staging form: Colon and Rectum, AJCC 8th Edition - Clinical stage from 02/04/2021: Stage IVB (cTX, cN2, cM1b) - Signed by Truitt Merle, MD on 02/12/2021  Stage prefix: Initial diagnosis    02/08/2021 Imaging   CT CHEST, ABDOMEN AND PELVIS W CONTRAST  IMPRESSION: 1. There is a large, concentric mass of the rectosigmoid junction measuring 7.8 x 5.9 x 5.2 cm. Findings are consistent with primary colon malignancy identified by colonoscopy. 2. Multiple enlarged perirectal, bilateral pelvic sidewall, and retroperitoneal lymph nodes, consistent with nodal metastatic disease. 3. There are multiple small low-attenuation lesions of the liver parenchyma, incompletely characterized although suspicious for hepatic metastatic disease. PET-CT or multiphasic contrast enhanced MRI could be used to more clearly characterize. 4. There are multiple small bilateral pulmonary nodules, measuring 3 mm and smaller, nonspecific although highly suspicious for early pulmonary metastatic disease given overall constellation of findings.   02/12/2021 Initial Diagnosis   Malignant neoplasm of rectosigmoid junction ([place])   03/04/2021 PET scan   IMPRESSION: 1. Intense hypermetabolic activity associated with rectal mass consistent with primary colorectal carcinoma. 2. Local extension of primary tumor into the  LEFT perirectal fat. 3. Hypermetabolic  LEFT operator node consistent with local nodal metastasis. 4. Hypermetabolic metastasis to the LEFT and RIGHT hepatic lobe (two lesions). 5. Hypermetabolic periportal lymph node. 6. Small pulmonary nodules without radiotracer activity. Recommend attention on follow-up.   03/13/2021 -  Chemotherapy    Patient is on Treatment Plan: COLORECTAL XELOX (CAPEOX) Q21D          CURRENT THERAPY:  Pending CAPOX and bevacizumab every 3 weeks   INTERVAL HISTORY:  Angela Mcpherson is scheduled for a virtual visit to discussed her test result . She was last seen by me on 02/12/2021.  She was at home when I called her, and her husband was with her.   She is clinically stable, still has rectal pain and irritation, with frequent bowel movement.  No hematochezia.  Her appetite and energy level are fair.  She denies any significant abdominal pain.  She spoke with our dietitian Angela Mcpherson 2 days ago, and he did try Enterade, which she feels it helped her diarrhea.  She is happy with that and has ordered a more.  All other systems were reviewed with the patient and are negative.  MEDICAL HISTORY:  Past Medical History:  Diagnosis Date   Colorectal cancer ([place])    Family history of brain cancer    Family history of prostate cancer    Family history of stomach cancer    Heart murmur    dx at 26   MVP is stable   Hemorrhoids     SURGICAL HISTORY: Past Surgical History:  Procedure Laterality Date   NO PAST SURGERIES      I have reviewed the social history and family history with the patient and they are unchanged from previous note.  ALLERGIES:  is allergic to codeine, epinephrine (anaphylaxis), orange fruit [citrus], streptogramins, and tomato.  MEDICATIONS:  Current Outpatient Medications  Medication Sig Dispense Refill   traMADol (ULTRAM) 50 MG tablet Take 0.5-1 tablets (25-50 mg total) by mouth every 8 (eight) hours as needed. 30 tablet 0   acetaminophen (TYLENOL) 325 MG tablet Take 650 mg by  mouth every 6 (six) hours as needed.     capecitabine (XELODA) 500 MG tablet Take 2 tablets (1000 mg total) by mouth in morning and 3 tablets (1500 mg total) by mouth in evening. Take within 30 minutes after meals. Take only on days of radiation, Monday through Friday. (Patient not taking: Reported on 02/26/2021) 75 tablet 1   capecitabine (XELODA) 500 MG tablet Take 2 tabs in morning and 3 tabs in evening every 12 hours. Take on days 1-14 of chemotherapy. Repeat every 21 days. 70 tablet 0   cholecalciferol (VITAMIN D3) 25 MCG (1000 UNIT) tablet Take 1,000 Units by mouth daily.     ibuprofen (ADVIL) 200 MG tablet Take 200 mg by mouth every 6 (six) hours as needed.     ondansetron (ZOFRAN) 8 MG tablet Take 1 tablet (8 mg total) by mouth 2 (two) times daily as needed for refractory nausea / vomiting. Start on day 3 after chemotherapy. 30 tablet 1   prochlorperazine (COMPAZINE) 10 MG tablet Take 1 tablet (10 mg total) by mouth every 6 (six) hours as needed (Nausea or vomiting). 30 tablet 1   VALERIAN ROOT PO Take by mouth.     vitamin C (ASCORBIC ACID) 500 MG tablet Take 500 mg by mouth daily.     No current facility-administered medications for this visit.    PHYSICAL EXAMINATION: ECOG PERFORMANCE STATUS: 1 -  Symptomatic but completely ambulatory  No vitals taken today, Exam not performed today  LABORATORY DATA:  I have reviewed the data as listed CBC Latest Ref Rng & Units 02/04/2021  WBC 4.0 - 10.5 K/uL 10.6(H)  Hemoglobin 12.0 - 15.0 g/dL 14.8  Hematocrit 36.0 - 46.0 % 43.5  Platelets 150.0 - 400.0 K/uL 186.0     CMP Latest Ref Rng & Units 02/04/2021 06/26/2010  Glucose 70 - 99 mg/dL 110(H) 93  BUN 6 - 23 mg/dL 12 12  Creatinine 0.40 - 1.20 mg/dL 0.57 0.69  Sodium 135 - 145 mEq/L 139 140  Potassium 3.5 - 5.1 mEq/L 4.1 4.4  Chloride 96 - 112 mEq/L 105 107  CO2 19 - 32 mEq/L 23 28  Calcium 8.4 - 10.5 mg/dL 9.4 9.6  Total Protein 6.0 - 8.3 g/dL 7.6 7.0  Total Bilirubin 0.2 - 1.2 mg/dL  0.8 0.9  Alkaline Phos 39 - 117 U/L 100 112  AST 0 - 37 U/L 48(H) 57(H)  ALT 0 - 35 U/L 71(H) 81(H)      RADIOGRAPHIC STUDIES: I have personally reviewed the radiological images as listed and agreed with the findings in the report. No results found.   ASSESSMENT & PLAN:  Angela Mcpherson is a 60 y.o. 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 -PET scan on 03/04/21 showed:intense hypermetabolic activity associated with rectal mass consistent with primary colorectal carcinoma; local extension of primary tumor into left perirectal fat; hypermetabolic left operator node; hypermetabolic metastasis to left and right hepatic lobe (two lesions); hypermetabolic periportal lymph node.  I personally reviewed the image and showed patient and her husband. -I discussed her liver biopsy to confirm metastasis.  Patient was reluctant to have biopsy, based on the PET scan findings, I think it is convincing that she has metastatic disease, will hold on liver biopsy at this point. -I again reviewed the natural history of colorectal cancer, and a small chance of cure for metastatic disease (usually with limited metastatic disease).  Unfortunately, given the bilobular liver metastasis and RP node metastasis, her disease is unlikely curable. -I discussed the treatment option, mainly with systemic treatment.  I discussed the common chemo regiment, such as FOLFOX, CAPOX, FOLFIRI, or FOLFOXFERI.  I discussed the potential side effects of each regiment, and the logistics of treatment.  After lengthy discussion, she agrees to try Capox first. -I also reviewed her [facility] genomic testing results, which showed K-ras mutation, and MSI stable disease.  Mutation burden was low at 1 muts/Mb.  She is not a candidate for immunotherapy or EGFR  inhibitor. -I recommend bevacizumab along with chemo.  --Chemotherapy consent: Side effects including but does not not limited to, fatigue, nausea, vomiting, diarrhea, hair loss, neuropathy, fluid retention, renal and kidney dysfunction, neutropenic fever, needed for blood transfusion, bleeding, hypotension, proteinuria, small risk of bleeding and thrombosis, bowel perforation, were discussed with patient in great detail. She agrees to proceed. -The goal of chemotherapy is palliative to prolong her life and improve her quality of life -We discussed the role of palliative radiation, if her rectal symptom gets worse -Plan to repeat CT staging scan in 3 months, to evaluate her response to treatment -If she has excellent response to chemotherapy, we will present her case in GI tumor conference again to see if she is a candidate for local therapy. -Patient  would like to hold on port placement for now, which is fine with me.   Plan -pt agrees to proceed with CAPOX and bevacizumab, with capecitabine 1000 mg in the morning, 1500 mg in the evening, every 12 hours, 14 days on and 7 days off, first cycle in a week -Chemo class before first cycle treatment -Follow-up with cycle 1 chemo       No problem-specific Assessment & Plan notes found for this encounter.   Orders Placed This Encounter  Procedures   CEA (IN HOUSE-[facility])FOR [place] ONLY    Standing Status:   Standing    Number of Occurrences:   30    Standing Expiration Date:   03/06/2022   CMP ([place] only)    Standing Status:   Standing    Number of Occurrences:   30    Standing Expiration Date:   03/06/2022   CBC with Differential (Cancer Center Only)    Standing Status:   Standing    Number of Occurrences:   30    Standing Expiration Date:   03/06/2022   Total Protein, Urine dipstick    Standing Status:   Standing    Number of Occurrences:   30    Standing Expiration Date:   03/06/2022    All questions were answered. The  patient knows to call the clinic with any problems, questions or concerns. No barriers to learning was detected. The total time spent in the appointment was 60 minutes.     Truitt Merle, MD 03/06/2021   I, Wilburn Mylar, am acting as scribe for Truitt Merle, MD.   I have reviewed the above documentation for accuracy and completeness, and I agree with the above.

## 2021-03-06 NOTE — Progress Notes (Signed)
START ON PATHWAY REGIMEN - Colorectal     A cycle is every 21 days:     Capecitabine      Oxaliplatin   **Always confirm dose/schedule in your pharmacy ordering system**  Patient Characteristics: Distant Metastases, Resectable, Neoadjuvant Therapy Planned Tumor Location: Rectal Therapeutic Status: Distant Metastases  Intent of Therapy: Non-Curative / Palliative Intent, Discussed with Patient

## 2021-03-07 ENCOUNTER — Other Ambulatory Visit: Payer: Self-pay | Admitting: Pharmacist

## 2021-03-07 ENCOUNTER — Telehealth: Payer: Self-pay | Admitting: Hematology

## 2021-03-07 DIAGNOSIS — C19 Malignant neoplasm of rectosigmoid junction: Secondary | ICD-10-CM

## 2021-03-07 MED ORDER — CAPECITABINE 500 MG PO TABS: ORAL_TABLET | ORAL | 0 refills | Status: DC

## 2021-03-07 NOTE — Progress Notes (Signed)
Oral Oncology Pharmacist Encounter  Prescription for Xeloda (capecitabine) is required per patient's insurance to be filled through [employer] Prescription redirected to [facility].  Leron Croak, PharmD, BCPS Hematology/Oncology Clinical Pharmacist [facility] [phone] 03/07/2021 8:09 AM

## 2021-03-07 NOTE — Telephone Encounter (Signed)
Oral Chemotherapy Pharmacist Encounter  I spoke with patient for overview of: Xeloda (capecitabine) for the treatment of metastatic colorectal cancer in conjunction with oxaliplatin and bevacizumab, planned duration until disease progression or unacceptable drug toxicity.  Counseled patient on administration, dosing, side effects, monitoring, drug-food interactions, safe handling, storage, and disposal.  Patient will take Xeloda '500mg'$  tablets, 2 tablets ('1000mg'$ ) by mouth in AM and 3 tabs ('1500mg'$ ) by mouth in PM, within 30 minutes of finishing meals, on days 1-14 of each 21 day cycle.   Xeloda start date: 03/13/21  Adverse effects include but are not limited to: fatigue, decreased blood counts, GI upset, diarrhea, mouth sores, and hand-foot syndrome.  Patient has anti-emetic on hand and knows to take it if nausea develops.   Patient will obtain anti diarrheal and alert the office of 4 or more loose stools above baseline.  Reviewed with patient importance of keeping a medication schedule and plan for any missed doses. No barriers to medication adherence identified.  Medication reconciliation performed and medication/allergy list updated.  Insurance authorization for Xeloda has been obtained. Patient's insurance requires Xeloda be filled through [employer] This has already been delivered to patient's home.   All questions answered.  Angela Mcpherson voiced understanding and appreciation.   Medication education handout and medication calendar placed in mail for patient. Patient knows to call the office with questions or concerns. Oral Chemotherapy Clinic phone number provided to patient.   Leron Croak, PharmD, BCPS Hematology/Oncology Clinical Pharmacist [facility] [phone] 03/07/2021 2:31 PM

## 2021-03-07 NOTE — Telephone Encounter (Signed)
Scheduled follow-up appointments per 7/20 los. Patient is aware. 

## 2021-03-11 ENCOUNTER — Encounter: Payer: Self-pay | Admitting: Hematology

## 2021-03-11 ENCOUNTER — Inpatient Hospital Stay: Payer: 59

## 2021-03-11 ENCOUNTER — Other Ambulatory Visit: Payer: Self-pay

## 2021-03-11 NOTE — Progress Notes (Signed)
Pharmacist Chemotherapy Monitoring - Initial Assessment    Anticipated start date: 03/13/21   The following has been reviewed per standard work regarding the patient's treatment regimen: The patient's diagnosis, treatment plan and drug doses, and organ/hematologic function Lab orders and baseline tests specific to treatment regimen  The treatment plan start date, drug sequencing, and pre-medications Prior authorization status  Patient's documented medication list, including drug-drug interaction screen and prescriptions for anti-emetics and supportive care specific to the treatment regimen The drug concentrations, fluid compatibility, administration routes, and timing of the medications to be used The patient's access for treatment and lifetime cumulative dose history, if applicable  The patient's medication allergies and previous infusion related reactions, if applicable   Changes made to treatment plan:  N/A  Follow up needed:  N/A   Kennith Center, Pharm.D., CPP 03/11/2021'@4'$ :26 PM

## 2021-03-12 NOTE — Progress Notes (Addendum)
[place]   [phone] [phone]   Clinic Follow up Note   Patient Care Team: Pcp, No as PCP - General Truitt Merle, MD as Consulting Physician (Oncology) Milus Banister, MD as Attending Physician (Gastroenterology) Kyung Rudd, MD as Consulting Physician (Radiation Oncology) 03/13/2021  CHIEF COMPLAINT:   SUMMARY OF ONCOLOGIC HISTORY: Oncology History Overview Note  Cancer Staging Malignant neoplasm of rectosigmoid junction [facility]) Staging form: Colon and Rectum, AJCC 8th Edition - Clinical stage from 02/04/2021: Stage IVB (cTX, cN2, cM1b) - Signed by Truitt Merle, MD on 02/12/2021 Stage prefix: Initial diagnosis    Malignant neoplasm of rectosigmoid junction ([place])  02/04/2021 Procedure   Colonoscopy by Dr Ardis Hughs 02/04/21 IMPRESSION - Two 2 to 6 mm polyps in the sigmoid colon and in the cecum, removed with a cold snare. Resected and retrieved. - A fungating, infiltrative and ulcerated partially obstructing mass was found in the rectosigmoid colon (distal edge about 7cm from the anus). The mass was partially circumferential (involving one-half of the lumen circumference). The mass measured eight cm in length. This was biopsied with a cold forceps for histology. The distal edge of the mass was tattooed with two submucosal injections of Spot (carbon black). Sigmoid colon felt somewhat fixed in place (large CRC or perhaps invasion into colon from other primary site?) - Internal hemorrhoids. - The examination was otherwise normal on direct and retroflexion views.   02/04/2021 Pathology Results   Diagnosis 1. 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 - NO HIGH-GRADE DYSPLASIA OR CARCINOMA. 2. Colon, biopsy, rectosigmoid - INVASIVE ADENOCARCINOMA. - SEE NOTE.   02/04/2021 Cancer Staging   Staging form: Colon and Rectum, AJCC 8th Edition - Clinical stage from 02/04/2021: Stage IVB (cTX, cN2, cM1b) - Signed by Truitt Merle, MD on  02/12/2021  Stage prefix: Initial diagnosis    02/08/2021 Imaging   CT CHEST, ABDOMEN AND PELVIS W CONTRAST  IMPRESSION: 1. There is a large, concentric mass of the rectosigmoid junction measuring 7.8 x 5.9 x 5.2 cm. Findings are consistent with primary colon malignancy identified by colonoscopy. 2. Multiple enlarged perirectal, bilateral pelvic sidewall, and retroperitoneal lymph nodes, consistent with nodal metastatic disease. 3. There are multiple small low-attenuation lesions of the liver parenchyma, incompletely characterized although suspicious for hepatic metastatic disease. PET-CT or multiphasic contrast enhanced MRI could be used to more clearly characterize. 4. There are multiple small bilateral pulmonary nodules, measuring 3 mm and smaller, nonspecific although highly suspicious for early pulmonary metastatic disease given overall constellation of findings.   02/12/2021 Initial Diagnosis   Malignant neoplasm of rectosigmoid junction ([place])   03/04/2021 PET scan   IMPRESSION: 1. Intense hypermetabolic activity associated with rectal mass consistent with primary colorectal carcinoma. 2. Local extension of primary tumor into the LEFT perirectal fat. 3. Hypermetabolic LEFT operator node consistent with local nodal metastasis. 4. Hypermetabolic metastasis to the LEFT and RIGHT hepatic lobe (two lesions). 5. Hypermetabolic periportal lymph node. 6. Small pulmonary nodules without radiotracer activity. Recommend attention on follow-up.   03/13/2021 -  Chemotherapy    Patient is on Treatment Plan: COLORECTAL XELOX (CAPEOX) Q21D         CURRENT THERAPY: CAPOX staring today   INTERVAL HISTORY: Pt is here to start first cycle chemo. She is clinically stable, she has lost about 10lbs in the past month. She still has moderate rectal pain, especially with BM. She is on high fiber diet and no constipation. She denies any other new symptoms,  she is on supplement Breeze one bottle  a day.   All other systems were reviewed with the patient and are negative.  MEDICAL HISTORY:  Past Medical History:  Diagnosis Date   Colorectal cancer ([place])    Family history of brain cancer    Family history of prostate cancer    Family history of stomach cancer    Heart murmur    dx at 26   MVP is stable   Hemorrhoids     SURGICAL HISTORY: Past Surgical History:  Procedure Laterality Date   NO PAST SURGERIES      I have reviewed the social history and family history with the patient and they are unchanged from previous note.  ALLERGIES:  is allergic to codeine, epinephrine (anaphylaxis), orange fruit [citrus], streptogramins, and tomato.  MEDICATIONS:  Current Outpatient Medications  Medication Sig Dispense Refill   acetaminophen (TYLENOL) 325 MG tablet Take 650 mg by mouth every 6 (six) hours as needed.     capecitabine (XELODA) 500 MG tablet Take 2 tabs in morning and 3 tabs in evening every 12 hours. Take on days 1-14 of chemotherapy. Repeat every 21 days. 70 tablet 0   cholecalciferol (VITAMIN D3) 25 MCG (1000 UNIT) tablet Take 1,000 Units by mouth daily.     ondansetron (ZOFRAN) 8 MG tablet Take 1 tablet (8 mg total) by mouth 2 (two) times daily as needed for refractory nausea / vomiting. Start on day 3 after chemotherapy. 30 tablet 1   prochlorperazine (COMPAZINE) 10 MG tablet Take 1 tablet (10 mg total) by mouth every 6 (six) hours as needed (Nausea or vomiting). 30 tablet 1   traMADol (ULTRAM) 50 MG tablet Take 0.5-1 tablets (25-50 mg total) by mouth every 8 (eight) hours as needed. 30 tablet 0   VALERIAN ROOT PO Take by mouth.     vitamin C (ASCORBIC ACID) 500 MG tablet Take 500 mg by mouth daily.     No current facility-administered medications for this visit.    PHYSICAL EXAMINATION: ECOG PERFORMANCE STATUS: 1 - Symptomatic but completely ambulatory  Vitals:   03/13/21 0910  BP: 106/77  Pulse: 83  Resp: 19  Temp: 98.1 F (36.7 C)  SpO2: 98%   Filed  Weights   03/13/21 0910  Weight: 113 lb 1.6 oz (51.3 kg)    GENERAL:alert, no distress and comfortable SKIN: skin color, texture, turgor are normal, no rashes or significant lesions EYES: normal, Conjunctiva are pink and non-injected, sclera clear OROPHARYNX:no exudate, no erythema and lips, buccal mucosa, and tongue normal  NECK: supple, thyroid normal size, non-tender, without nodularity LYMPH:  no palpable lymphadenopathy in the cervical, axillary or inguinal LUNGS: clear to auscultation and percussion with normal breathing effort HEART: regular rate & rhythm and no murmurs and no lower extremity edema ABDOMEN:abdomen soft, non-tender and normal bowel sounds Musculoskeletal:no cyanosis of digits and no clubbing  NEURO: alert & oriented x 3 with fluent speech, no focal motor/sensory deficits  LABORATORY DATA:  I have reviewed the data as listed CBC Latest Ref Rng & Units 03/13/2021 02/04/2021  WBC 4.0 - 10.5 K/uL 7.9 10.6(H)  Hemoglobin 12.0 - 15.0 g/dL 14.1 14.8  Hematocrit 36.0 - 46.0 % 40.6 43.5  Platelets 150 - 400 K/uL 158 186.0     CMP Latest Ref Rng & Units 02/04/2021 06/26/2010  Glucose 70 - 99 mg/dL 110(H) 93  BUN 6 - 23 mg/dL 12 12  Creatinine 0.40 - 1.20 mg/dL 0.57 0.69  Sodium 135 - 145  mEq/L 139 140  Potassium 3.5 - 5.1 mEq/L 4.1 4.4  Chloride 96 - 112 mEq/L 105 107  CO2 19 - 32 mEq/L 23 28  Calcium 8.4 - 10.5 mg/dL 9.4 9.6  Total Protein 6.0 - 8.3 g/dL 7.6 7.0  Total Bilirubin 0.2 - 1.2 mg/dL 0.8 0.9  Alkaline Phos 39 - 117 U/L 100 112  AST 0 - 37 U/L 48(H) 57(H)  ALT 0 - 35 U/L 71(H) 81(H)      RADIOGRAPHIC STUDIES: I have personally reviewed the radiological images as listed and agreed with the findings in the report. No results found.   ASSESSMENT & PLAN:  Angela Mcpherson is a 60 y.o. 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  I discussed the benefit and the potential side effects, especially hypertension, proteinuria, small risk of hemorrhage and thrombosis, bowel perforation, etc., she agrees to proceed -She has had a chemo class, lab reviewed, adequate for for cycle Capox today   2.  Goal of care discussion, DNR  -We again discussed the incurable nature of her cancer, and the overall poor prognosis, especially if she does not have good response to chemotherapy or progress on chemo -The patient understands the goal of care is palliative. -I recommend DNR/DNI, she agrees    Plan -start CAPOX today with dose reduction to see how she tolerates  -Virtual visit with toxicity checkup in next Friday -I signed her DNR order  -Lab, follow-up and second cycle Capox and beva in 3 weeks   No orders of the defined types were placed in this encounter.  All questions were answered. The patient knows to call the clinic with any problems, questions or concerns. No barriers to learning was detected. I spent 25 minutes counseling the patient face to face. The total time spent in the appointment was 30 minutes and more than 50% was on counseling and review of test results     Truitt Merle, MD 03/13/21

## 2021-03-13 ENCOUNTER — Encounter: Payer: Self-pay | Admitting: Hematology

## 2021-03-13 ENCOUNTER — Inpatient Hospital Stay: Payer: 59

## 2021-03-13 ENCOUNTER — Inpatient Hospital Stay (HOSPITAL_BASED_OUTPATIENT_CLINIC_OR_DEPARTMENT_OTHER): Payer: 59 | Admitting: Hematology

## 2021-03-13 ENCOUNTER — Other Ambulatory Visit: Payer: Self-pay

## 2021-03-13 VITALS — BP 106/77 | HR 83 | Temp 98.1°F | Resp 19 | Ht 63.0 in | Wt 113.1 lb

## 2021-03-13 DIAGNOSIS — C19 Malignant neoplasm of rectosigmoid junction: Secondary | ICD-10-CM

## 2021-03-13 DIAGNOSIS — Z5112 Encounter for antineoplastic immunotherapy: Secondary | ICD-10-CM | POA: Diagnosis present

## 2021-03-13 DIAGNOSIS — Z66 Do not resuscitate: Secondary | ICD-10-CM | POA: Insufficient documentation

## 2021-03-13 DIAGNOSIS — C787 Secondary malignant neoplasm of liver and intrahepatic bile duct: Secondary | ICD-10-CM | POA: Diagnosis not present

## 2021-03-13 DIAGNOSIS — Z5111 Encounter for antineoplastic chemotherapy: Secondary | ICD-10-CM | POA: Diagnosis present

## 2021-03-13 LAB — CBC WITH DIFFERENTIAL (CANCER CENTER ONLY)
Abs Immature Granulocytes: 0.02 10*3/uL (ref 0.00–0.07)
Basophils Absolute: 0 10*3/uL (ref 0.0–0.1)
Basophils Relative: 0 %
Eosinophils Absolute: 0 10*3/uL (ref 0.0–0.5)
Eosinophils Relative: 1 %
HCT: 40.6 % (ref 36.0–46.0)
Hemoglobin: 14.1 g/dL (ref 12.0–15.0)
Immature Granulocytes: 0 %
Lymphocytes Relative: 23 %
Lymphs Abs: 1.8 10*3/uL (ref 0.7–4.0)
MCH: 31.2 pg (ref 26.0–34.0)
MCHC: 34.7 g/dL (ref 30.0–36.0)
MCV: 89.8 fL (ref 80.0–100.0)
Monocytes Absolute: 0.6 10*3/uL (ref 0.1–1.0)
Monocytes Relative: 8 %
Neutro Abs: 5.3 10*3/uL (ref 1.7–7.7)
Neutrophils Relative %: 68 %
Platelet Count: 158 10*3/uL (ref 150–400)
RBC: 4.52 MIL/uL (ref 3.87–5.11)
RDW: 14.1 % (ref 11.5–15.5)
WBC Count: 7.9 10*3/uL (ref 4.0–10.5)
nRBC: 0 % (ref 0.0–0.2)

## 2021-03-13 LAB — CMP (CANCER CENTER ONLY)
ALT: 67 U/L — ABNORMAL HIGH (ref 0–44)
AST: 37 U/L (ref 15–41)
Albumin: 3.9 g/dL (ref 3.5–5.0)
Alkaline Phosphatase: 101 U/L (ref 38–126)
Anion gap: 8 (ref 5–15)
BUN: 9 mg/dL (ref 6–20)
CO2: 25 mmol/L (ref 22–32)
Calcium: 9.9 mg/dL (ref 8.9–10.3)
Chloride: 103 mmol/L (ref 98–111)
Creatinine: 0.64 mg/dL (ref 0.44–1.00)
GFR, Estimated: >60 mL/min (ref >60)
Glucose, Bld: 90 mg/dL (ref 70–99)
Potassium: 4.3 mmol/L (ref 3.5–5.1)
Sodium: 136 mmol/L (ref 135–145)
Total Bilirubin: 0.7 mg/dL (ref 0.3–1.2)
Total Protein: 7.2 g/dL (ref 6.5–8.1)

## 2021-03-13 LAB — CEA (IN HOUSE-CHCC): CEA (CHCC-In House): 1177.27 ng/mL — ABNORMAL HIGH (ref 0.00–5.00)

## 2021-03-13 LAB — TOTAL PROTEIN, URINE DIPSTICK: Protein, ur: NEGATIVE mg/dL

## 2021-03-13 MED ORDER — SODIUM CHLORIDE 0.9 % IV SOLN
Freq: Once | INTRAVENOUS | Status: AC
  Filled 2021-03-13: qty 250

## 2021-03-13 MED ORDER — PALONOSETRON HCL INJECTION 0.25 MG/5ML
INTRAVENOUS | Status: AC
  Filled 2021-03-13: qty 5

## 2021-03-13 MED ORDER — SODIUM CHLORIDE 0.9 % IV SOLN
10.0000 mg | Freq: Once | INTRAVENOUS | Status: AC
  Administered 2021-03-13: 10 mg via INTRAVENOUS
  Filled 2021-03-13: qty 10

## 2021-03-13 MED ORDER — OXALIPLATIN CHEMO INJECTION 100 MG/20ML
110.0000 mg/m2 | Freq: Once | INTRAVENOUS | Status: AC
  Administered 2021-03-13: 165 mg via INTRAVENOUS
  Filled 2021-03-13: qty 33

## 2021-03-13 MED ORDER — PALONOSETRON HCL INJECTION 0.25 MG/5ML
0.2500 mg | Freq: Once | INTRAVENOUS | Status: AC
  Administered 2021-03-13: 0.25 mg via INTRAVENOUS

## 2021-03-13 MED ORDER — DEXTROSE 5 % IV SOLN
Freq: Once | INTRAVENOUS | Status: AC
  Filled 2021-03-13: qty 250

## 2021-03-13 MED ORDER — SODIUM CHLORIDE 0.9 % IV SOLN
7.5000 mg/kg | Freq: Once | INTRAVENOUS | Status: AC
  Administered 2021-03-13: 400 mg via INTRAVENOUS
  Filled 2021-03-13: qty 16

## 2021-03-13 NOTE — Patient Instructions (Signed)
[facility]  Discharge Instructions: Thank you for choosing [facility] to provide your oncology and hematology care.   If you have a lab appointment with the [facility], please go directly to the [facility] and check in at the registration area.   Wear comfortable clothing and clothing appropriate for easy access to any Portacath or PICC line.   We strive to give you quality time with your provider. You may need to reschedule your appointment if you arrive late (15 or more minutes).  Arriving late affects you and other patients whose appointments are after yours.  Also, if you miss three or more appointments without notifying the office, you may be dismissed from the clinic at the provider's discretion.      For prescription refill requests, have your pharmacy contact our office and allow 72 hours for refills to be completed.    Today you received the following chemotherapy and/or immunotherapy agents: Bevacizumab, Oxaliplatin     To help prevent nausea and vomiting after your treatment, we encourage you to take your nausea medication as directed.  BELOW ARE SYMPTOMS THAT SHOULD BE REPORTED IMMEDIATELY: *FEVER GREATER THAN 100.4 F (38 C) OR HIGHER *CHILLS OR SWEATING *NAUSEA AND VOMITING THAT IS NOT CONTROLLED WITH YOUR NAUSEA MEDICATION *UNUSUAL SHORTNESS OF BREATH *UNUSUAL BRUISING OR BLEEDING *URINARY PROBLEMS (pain or burning when urinating, or frequent urination) *BOWEL PROBLEMS (unusual diarrhea, constipation, pain near the anus) TENDERNESS IN MOUTH AND THROAT WITH OR WITHOUT PRESENCE OF ULCERS (sore throat, sores in mouth, or a toothache) UNUSUAL RASH, SWELLING OR PAIN  UNUSUAL VAGINAL DISCHARGE OR ITCHING   Items with * indicate a potential emergency and should be followed up as soon as possible or go to the Emergency Department if any problems should occur.  Please show the CHEMOTHERAPY ALERT CARD or IMMUNOTHERAPY ALERT CARD  at check-in to the Emergency Department and triage nurse.  Should you have questions after your visit or need to cancel or reschedule your appointment, please contact [facility]  Dept: [phone]  and follow the prompts.  Office hours are 8:00 a.m. to 4:30 p.m. Monday - Friday. Please note that voicemails left after 4:00 p.m. may not be returned until the following business day.  We are closed weekends and major holidays. You have access to a nurse at all times for urgent questions. Please call the main number to the clinic Dept: [phone] and follow the prompts.   For any non-urgent questions, you may also contact your provider using MyChart. We now offer e-Visits for anyone 59 and older to request care online for non-urgent symptoms. For details visit [email or web address].   Also download the MyChart app! Go to the app store, search "MyChart", open the app, select [facility], and log in with your MyChart username and password.  Due to Covid, a mask is required upon entering the hospital/clinic. If you do not have a mask, one will be given to you upon arrival. For doctor visits, patients may have 1 support person aged 19 or older with them. For treatment visits, patients cannot have anyone with them due to current Covid guidelines and our immunocompromised population.   Bevacizumab injection What is this medication? BEVACIZUMAB (be va SIZ yoo mab) is a monoclonal antibody. It is used to treatmany types of cancer. This medicine may be used for other purposes; ask your health care provider orpharmacist if you have questions. COMMON BRAND NAME(S): Avastin, MVASI, Zirabev What  should I tell my care team before I take this medication? They need to know if you have any of these conditions: diabetes heart disease high blood pressure history of coughing up blood prior anthracycline chemotherapy (e.g., doxorubicin, daunorubicin, epirubicin) recent or ongoing  radiation therapy recent or planning to have surgery stroke an unusual or allergic reaction to bevacizumab, hamster proteins, mouse proteins, other medicines, foods, dyes, or preservatives pregnant or trying to get pregnant breast-feeding How should I use this medication? This medicine is for infusion into a vein. It is given by a health careprofessional in a hospital or clinic setting. Talk to your pediatrician regarding the use of this medicine in children.Special care may be needed. Overdosage: If you think you have taken too much of this medicine contact apoison control center or emergency room at once. NOTE: This medicine is only for you. Do not share this medicine with others. What if I miss a dose? It is important not to miss your dose. Call your doctor or health careprofessional if you are unable to keep an appointment. What may interact with this medication? Interactions are not expected. This list may not describe all possible interactions. Give your health care provider a list of all the medicines, herbs, non-prescription drugs, or dietary supplements you use. Also tell them if you smoke, drink alcohol, or use illegaldrugs. Some items may interact with your medicine. What should I watch for while using this medication? Your condition will be monitored carefully while you are receiving this medicine. You will need important blood work and urine testing done while youare taking this medicine. This medicine may increase your risk to bruise or bleed. Call your doctor orhealth care professional if you notice any unusual bleeding. Before having surgery, talk to your health care provider to make sure it is ok. This drug can increase the risk of poor healing of your surgical site or wound. You will need to stop this drug for 28 days before surgery. After surgery, wait at least 28 days before restarting this drug. Make sure the surgical site or wound is healed enough before restarting this drug.  Talk to your health careprovider if questions. Do not become pregnant while taking this medicine or for 6 months after stopping it. Women should inform their doctor if they wish to become pregnant or think they might be pregnant. There is a potential for serious side effects to an unborn child. Talk to your health care professional or pharmacist for more information. Do not breast-feed an infant while taking this medicine andfor 6 months after the last dose. This medicine has caused ovarian failure in some women. This medicine may interfere with the ability to have a child. You should talk to your doctor orhealth care professional if you are concerned about your fertility. What side effects may I notice from receiving this medication? Side effects that you should report to your doctor or health care professionalas soon as possible: allergic reactions like skin rash, itching or hives, swelling of the face, lips, or tongue chest pain or chest tightness chills coughing up blood high fever seizures severe constipation signs and symptoms of bleeding such as bloody or black, tarry stools; red or dark-brown urine; spitting up blood or brown material that looks like coffee grounds; red spots on the skin; unusual bruising or bleeding from the eye, gums, or nose signs and symptoms of a blood clot such as breathing problems; chest pain; severe, sudden headache; pain, swelling, warmth in the leg signs and symptoms  of a stroke like changes in vision; confusion; trouble speaking or understanding; severe headaches; sudden numbness or weakness of the face, arm or leg; trouble walking; dizziness; loss of balance or coordination stomach pain sweating swelling of legs or ankles vomiting weight gain Side effects that usually do not require medical attention (report to yourdoctor or health care professional if they continue or are bothersome): back pain changes in taste decreased appetite dry  skin nausea tiredness This list may not describe all possible side effects. Call your doctor for medical advice about side effects. You may report side effects to FDA at1-800-FDA-1088. Where should I keep my medication? This drug is given in a hospital or clinic and will not be stored at home. NOTE: This sheet is a summary. It may not cover all possible information. If you have questions about this medicine, talk to your doctor, pharmacist, orhealth care provider.  2022 Elsevier/Gold Standard (2019-06-01 10:50:46)  Oxaliplatin Injection What is this medication? OXALIPLATIN (ox AL i PLA tin) is a chemotherapy drug. It targets fast dividing cells, like cancer cells, and causes these cells to die. This medicine is usedto treat cancers of the colon and rectum, and many other cancers. This medicine may be used for other purposes; ask your health care provider orpharmacist if you have questions. COMMON BRAND NAME(S): Eloxatin What should I tell my care team before I take this medication? They need to know if you have any of these conditions: heart disease history of irregular heartbeat liver disease low blood counts, like white cells, platelets, or red blood cells lung or breathing disease, like asthma take medicines that treat or prevent blood clots tingling of the fingers or toes, or other nerve disorder an unusual or allergic reaction to oxaliplatin, other chemotherapy, other medicines, foods, dyes, or preservatives pregnant or trying to get pregnant breast-feeding How should I use this medication? This drug is given as an infusion into a vein. It is administered in a hospitalor clinic by a specially trained health care professional. Talk to your pediatrician regarding the use of this medicine in children.Special care may be needed. Overdosage: If you think you have taken too much of this medicine contact apoison control center or emergency room at once. NOTE: This medicine is only for  you. Do not share this medicine with others. What if I miss a dose? It is important not to miss a dose. Call your doctor or health careprofessional if you are unable to keep an appointment. What may interact with this medication? Do not take this medicine with any of the following medications: cisapride dronedarone pimozide thioridazine This medicine may also interact with the following medications: aspirin and aspirin-like medicines certain medicines that treat or prevent blood clots like warfarin, apixaban, dabigatran, and rivaroxaban cisplatin cyclosporine diuretics medicines for infection like acyclovir, adefovir, amphotericin B, bacitracin, cidofovir, foscarnet, ganciclovir, gentamicin, pentamidine, vancomycin NSAIDs, medicines for pain and inflammation, like ibuprofen or naproxen other medicines that prolong the QT interval (an abnormal heart rhythm) pamidronate zoledronic acid This list may not describe all possible interactions. Give your health care provider a list of all the medicines, herbs, non-prescription drugs, or dietary supplements you use. Also tell them if you smoke, drink alcohol, or use illegaldrugs. Some items may interact with your medicine. What should I watch for while using this medication? Your condition will be monitored carefully while you are receiving thismedicine. You may need blood work done while you are taking this medicine. This medicine may make you feel generally unwell.  This is not uncommon as chemotherapy can affect healthy cells as well as cancer cells. Report any side effects. Continue your course of treatment even though you feel ill unless yourhealthcare professional tells you to stop. This medicine can make you more sensitive to cold. Do not drink cold drinks or use ice. Cover exposed skin before coming in contact with cold temperatures or cold objects. When out in cold weather wear warm clothing and cover your mouth and nose to warm the air that  goes into your lungs. Tell your doctor if you getsensitive to the cold. Do not become pregnant while taking this medicine or for 9 months after stopping it. Women should inform their health care professional if they wish to become pregnant or think they might be pregnant. Men should not father a child while taking this medicine and for 6 months after stopping it. There is potential for serious side effects to an unborn child. Talk to your health careprofessional for more information. Do not breast-feed a child while taking this medicine or for 3 months afterstopping it. This medicine has caused ovarian failure in some women. This medicine may make it more difficult to get pregnant. Talk to your health care professional if [place] concerned about your fertility. This medicine has caused decreased sperm counts in some men. This may make it more difficult to father a child. Talk to your health care professional if [place] concerned about your fertility. This medicine may increase your risk of getting an infection. Call your health care professional for advice if you get a fever, chills, or sore throat, or other symptoms of a cold or flu. Do not treat yourself. Try to avoid beingaround people who are sick. Avoid taking medicines that contain aspirin, acetaminophen, ibuprofen, naproxen, or ketoprofen unless instructed by your health care professional.These medicines may hide a fever. Be careful brushing or flossing your teeth or using a toothpick because you may get an infection or bleed more easily. If you have any dental work done, Primary school teacher you are receiving this medicine. What side effects may I notice from receiving this medication? Side effects that you should report to your doctor or health care professionalas soon as possible: allergic reactions like skin rash, itching or hives, swelling of the face, lips, or tongue breathing problems cough low blood counts - this medicine may decrease the  number of white blood cells, red blood cells, and platelets. You may be at increased risk for infections and bleeding nausea, vomiting pain, redness, or irritation at site where injected pain, tingling, numbness in the hands or feet signs and symptoms of bleeding such as bloody or black, tarry stools; red or dark brown urine; spitting up blood or brown material that looks like coffee grounds; red spots on the skin; unusual bruising or bleeding from the eyes, gums, or nose signs and symptoms of a dangerous change in heartbeat or heart rhythm like chest pain; dizziness; fast, irregular heartbeat; palpitations; feeling faint or lightheaded; falls signs and symptoms of infection like fever; chills; cough; sore throat; pain or trouble passing urine signs and symptoms of liver injury like dark yellow or brown urine; general ill feeling or flu-like symptoms; light-colored stools; loss of appetite; nausea; right upper belly pain; unusually weak or tired; yellowing of the eyes or skin signs and symptoms of low red blood cells or anemia such as unusually weak or tired; feeling faint or lightheaded; falls signs and symptoms of muscle injury like dark urine; trouble passing urine or change  in the amount of urine; unusually weak or tired; muscle pain; back pain Side effects that usually do not require medical attention (report to yourdoctor or health care professional if they continue or are bothersome): changes in taste diarrhea gas hair loss loss of appetite mouth sores This list may not describe all possible side effects. Call your doctor for medical advice about side effects. You may report side effects to FDA at1-800-FDA-1088. Where should I keep my medication? This drug is given in a hospital or clinic and will not be stored at home. NOTE: This sheet is a summary. It may not cover all possible information. If you have questions about this medicine, talk to your doctor, pharmacist, orhealth care  provider.  2022 Elsevier/Gold Standard (2018-12-22 12:20:35)

## 2021-03-14 ENCOUNTER — Encounter: Payer: Self-pay | Admitting: Hematology

## 2021-03-22 ENCOUNTER — Inpatient Hospital Stay (HOSPITAL_BASED_OUTPATIENT_CLINIC_OR_DEPARTMENT_OTHER): Payer: 59 | Admitting: Hematology

## 2021-03-22 DIAGNOSIS — C19 Malignant neoplasm of rectosigmoid junction: Secondary | ICD-10-CM | POA: Diagnosis not present

## 2021-03-22 NOTE — Progress Notes (Addendum)
Angela Mcpherson   Telephone:([phone] Fax:([phone]   Clinic Follow up Note   Patient Care Team: Pcp, No as PCP - General Truitt Merle, MD as Consulting Physician (Oncology) Milus Banister, MD as Attending Physician (Gastroenterology) Kyung Rudd, MD as Consulting Physician (Radiation Oncology)  Date of Service:  03/22/2021  I connected with [name] on 03/22/2021 at 11:00 AM EDT by telephone visit and verified that I am speaking with the correct person using two identifiers.  I discussed the limitations, risks, security and privacy concerns of performing an evaluation and management service by telephone and the availability of in person appointments. I also discussed with the patient that there may be a patient responsible charge related to this service. The patient expressed understanding and agreed to proceed.   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 Resected and retrieved. - A fungating, infiltrative and ulcerated partially obstructing mass was found in the rectosigmoid colon (distal edge about 7cm from the anus). The mass was partially circumferential (involving one-half of the lumen circumference). The mass measured eight cm in length. This was biopsied with a cold forceps for histology.  The distal edge of the mass was tattooed with two submucosal injections of Spot (carbon black). Sigmoid colon felt somewhat fixed in place (large CRC or perhaps invasion into colon from other primary site?) - Internal hemorrhoids. - The examination was otherwise normal on direct and retroflexion views.   02/04/2021 Pathology Results   Diagnosis 1. 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 - NO HIGH-GRADE DYSPLASIA OR CARCINOMA. 2. Colon, biopsy, rectosigmoid - INVASIVE ADENOCARCINOMA. - SEE NOTE.   02/04/2021 Cancer Staging   Staging form: Colon and Rectum, AJCC 8th Edition - Clinical stage from 02/04/2021: Stage IVB (cTX, cN2, cM1b) - Signed by Truitt Merle, MD on 02/12/2021  Stage prefix: Initial diagnosis    02/08/2021 Imaging   CT CHEST, ABDOMEN AND PELVIS W CONTRAST  IMPRESSION: 1. There is a large, concentric mass of the rectosigmoid junction measuring 7.8 x 5.9 x 5.2 cm. Findings are consistent with primary colon malignancy identified by colonoscopy. 2. Multiple enlarged perirectal, bilateral pelvic sidewall, and retroperitoneal lymph nodes, consistent with nodal metastatic disease. 3. There are multiple small low-attenuation lesions of the liver parenchyma, incompletely characterized although suspicious for hepatic metastatic disease. PET-CT or multiphasic contrast enhanced MRI could be used to more clearly characterize. 4. There are multiple small bilateral pulmonary nodules, measuring 3 mm and smaller, nonspecific although highly suspicious for early pulmonary metastatic disease given overall constellation of findings.   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 Intense hypermetabolic activity associated with rectal mass consistent with primary colorectal carcinoma. 2. Local extension of primary tumor into the LEFT perirectal fat.  3. Hypermetabolic LEFT operator node  consistent with local nodal metastasis. 4. Hypermetabolic metastasis to the LEFT and RIGHT hepatic lobe (two lesions). 5. Hypermetabolic periportal lymph node. 6. Small pulmonary nodules without radiotracer activity. Recommend attention on follow-up.   03/13/2021 -  Chemotherapy    Patient is on Treatment Plan: COLORECTAL XELOX (CAPEOX) Q21D   Patient is on Antibody Plan: COLORECTAL BEVACIZUMAB Q21D     04/03/2021 -  Chemotherapy    Patient is on Treatment Plan: COLORECTAL XELOX (CAPEOX) Q21D   Patient is on Antibody Plan: COLORECTAL BEVACIZUMAB Q21D        CURRENT THERAPY:  CAPOX with bevacizumab, starting 03/13/21  INTERVAL HISTORY:  [name] was contacted for a follow up of metastatic colon cancer. She was last seen by me on 03/13/21.  She reports a single experience of a nose bleed. She also had an instance of tailbone pain, relieved with tramadol. She denies dysuria but notes her urine is really warm. She notes some anal pain, so she reduced her laxative (Miralax). She also notes she is having to force herself to eat, though she notes she does not eat much at baseline. She endorses continuing to drink plenty of fluids, including water and Boost.   All other systems were reviewed with the patient and are negative.  MEDICAL HISTORY:  Past Medical History:  Diagnosis Date   Colorectal cancer (Angela Mcpherson)    Family history of brain cancer    Family history of prostate cancer    Family history of stomach cancer    Heart murmur    dx at 26   MVP is stable   Hemorrhoids     SURGICAL HISTORY: Past Surgical History:  Procedure Laterality Date   NO PAST SURGERIES      I have reviewed the social history and family history with the patient and they are unchanged from previous note.  ALLERGIES:  is allergic to codeine, epinephrine (anaphylaxis), orange fruit [citrus], streptogramins, and tomato.  MEDICATIONS:  Current Outpatient Medications  Medication Sig Dispense Refill    acetaminophen (TYLENOL) 325 MG tablet Take 650 mg by mouth every 6 (six) hours as needed.     capecitabine (XELODA) 500 MG tablet Take 2 tabs in morning and 3 tabs in evening every 12 hours. Take on days 1-14 of chemotherapy. Repeat every 21 days. 70 tablet 0   cholecalciferol (VITAMIN D3) 25 MCG (1000 UNIT) tablet Take 1,000 Units by mouth daily.     ondansetron (ZOFRAN) 8 MG tablet Take 1 tablet (8 mg total) by mouth 2 (two) times daily as needed for refractory nausea / vomiting. Start on day 3 after chemotherapy. 30 tablet 1   prochlorperazine (COMPAZINE) 10 MG tablet Take 1 tablet (10 mg total) by mouth every 6 (six) hours as needed (Nausea or vomiting). 30 tablet 1   traMADol (ULTRAM) 50 MG tablet Take 0.5-1 tablets (25-50 mg total) by mouth every 8 (eight) hours as needed. 30 tablet 0   VALERIAN ROOT PO Take by mouth.     vitamin C (ASCORBIC ACID) 500 MG tablet Take 500 mg by mouth daily.     No current facility-administered medications for this visit.    PHYSICAL EXAMINATION: ECOG PERFORMANCE STATUS: 1 - Symptomatic but completely ambulatory  There were no vitals filed for this visit. Wt Readings from Last 3 Encounters:  03/13/21 113 lb 1.6 oz (51.3 kg)  03/04/21 110 lb 3.2 oz (50 kg)  02/26/21 114 lb (51.7 kg)    Physical exam  not performed today.  LABORATORY DATA:  I have reviewed the data as listed CBC Latest Ref Rng & Units 03/13/2021 02/04/2021  WBC 4.0 - 10.5 K/uL 7.9 10.6(H)  Hemoglobin 12.0 - 15.0 g/dL 14.1 14.8  Hematocrit 36.0 - 46.0 % 40.6 43.5  Platelets 150 - 400 K/uL 158 186.0     CMP Latest Ref Rng & Units 03/13/2021 02/04/2021 06/26/2010  Glucose 70 - 99 mg/dL 90 110(H) 93  BUN 6 - 20 mg/dL 9 12 12   Creatinine 0.44 - 1.00 mg/dL 0.64 0.57 0.69  Sodium 135 - 145 mmol/L 136 139 140  Potassium 3.5 - 5.1 mmol/L 4.3 4.1 4.4  Chloride 98 - 111 mmol/L 103 105 107  CO2 22 - 32 mmol/L 25 23 28   Calcium 8.9 - 10.3 mg/dL 9.9 9.4 9.6  Total Protein 6.5 - 8.1 g/dL 7.2  7.6 7.0  Total Bilirubin 0.3 - 1.2 mg/dL 0.7 0.8 0.9  Alkaline Phos 38 - 126 U/L 101 100 112  AST 15 - 41 U/L 37 48(H) 57(H)  ALT 0 - 44 U/L 67(H) 71(H) 81(H)      RADIOGRAPHIC STUDIES: I have personally reviewed the radiological images as listed and agreed with the findings in the report. No results found.   ASSESSMENT & PLAN:  Meadow Abramo is a 61 y.o. female with   1. 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 Colonoscopy on 02/04/21 showed 8 cm mass in rectosigmoid colon. Biopsy confirmed invasive adenocarcinoma. -Staging CT CAP 02/08/21 showed enlarged pelvic lymph nodes. -PET scan on 03/04/21 showed:intense hypermetabolic activity associated with rectal mass consistent with primary colorectal carcinoma; local extension of primary tumor into left perirectal fat; hypermetabolic left operator node; hypermetabolic metastasis to left and right hepatic lobe (two lesions); hypermetabolic periportal lymph node.  -[facility] genomic testing showed K-ras mutation, and MSI stable disease.  Mutation burden was low at 1 muts/Mb.  She is not a candidate for immunotherapy or EGFR inhibitor. -She began CAPOX with bevacizumab on 03/13/21. She tolerated first cycle relatively well -we again reviewed diet, exercise, neutropenic fever precaution, and the management of side effects from chemo. -She will return in 2 weeks for cycle 2 chemotherapy   Plan -return for C2 CAPOX and bevacizumab in 2 weeks -lab and Follow-up with cycle 2 chemo -I refilled Xeloda      No problem-specific Assessment & Plan notes found for this encounter.   No orders of the defined types were placed in this encounter.  All questions were answered. The patient knows to call the clinic with any problems, questions or concerns. No barriers to learning was detected. The total time spent in  the appointment was 15 minutes.     Truitt Merle, MD 03/22/2021   I, Wilburn Mylar, am acting as scribe for Truitt Merle, MD.   I have reviewed the above documentation for accuracy and completeness, and I agree with the above.

## 2021-03-23 ENCOUNTER — Encounter: Payer: Self-pay | Admitting: Hematology

## 2021-03-23 MED ORDER — CAPECITABINE 500 MG PO TABS
ORAL_TABLET | ORAL | 1 refills | Status: DC
Start: 2021-03-23 — End: 2021-06-21

## 2021-04-02 ENCOUNTER — Encounter: Payer: Self-pay | Admitting: Licensed Clinical Social Worker

## 2021-04-02 ENCOUNTER — Ambulatory Visit: Payer: Self-pay | Admitting: Licensed Clinical Social Worker

## 2021-04-02 ENCOUNTER — Telehealth: Payer: Self-pay | Admitting: Licensed Clinical Social Worker

## 2021-04-02 DIAGNOSIS — C19 Malignant neoplasm of rectosigmoid junction: Secondary | ICD-10-CM

## 2021-04-02 DIAGNOSIS — Z8042 Family history of malignant neoplasm of prostate: Secondary | ICD-10-CM

## 2021-04-02 DIAGNOSIS — Z1379 Encounter for other screening for genetic and chromosomal anomalies: Secondary | ICD-10-CM

## 2021-04-02 DIAGNOSIS — Z808 Family history of malignant neoplasm of other organs or systems: Secondary | ICD-10-CM

## 2021-04-02 DIAGNOSIS — Z800 Family history of malignant neoplasm of digestive organs: Secondary | ICD-10-CM

## 2021-04-02 NOTE — Progress Notes (Signed)
[place]   Telephone:(336) [phone] Fax:(336) [phone]   Clinic Follow up Note   Patient Care Team: Pcp, No as PCP - General Truitt Merle, MD as Consulting Physician (Oncology) Milus Banister, MD as Attending Physician (Gastroenterology) Kyung Rudd, MD as Consulting Physician (Radiation Oncology) 04/03/2021  CHIEF COMPLAINT:   SUMMARY OF ONCOLOGIC HISTORY: Oncology History Overview Note  Cancer Staging Malignant neoplasm of rectosigmoid junction [facility]) Staging form: Colon and Rectum, AJCC 8th Edition - Clinical stage from 02/04/2021: Stage IVB (cTX, cN2, cM1b) - Signed by Truitt Merle, MD on 02/12/2021 Stage prefix: Initial diagnosis    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 Resected and retrieved. - A fungating, infiltrative and ulcerated partially obstructing mass was found in the rectosigmoid colon (distal edge about 7cm from the anus). The mass was partially circumferential (involving one-half of the lumen circumference). The mass measured eight cm in length. This was biopsied with a cold forceps for histology. The distal edge of the mass was tattooed with two submucosal injections of Spot (carbon black). Sigmoid colon felt somewhat fixed in place (large CRC or perhaps invasion into colon from other primary site?) - Internal hemorrhoids. - The examination was otherwise normal on direct and retroflexion views.   02/04/2021 Pathology Results   Diagnosis 1. 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 - NO HIGH-GRADE DYSPLASIA OR CARCINOMA. 2. Colon, biopsy, rectosigmoid - INVASIVE ADENOCARCINOMA. - SEE NOTE.   02/04/2021 Cancer Staging   Staging form: Colon and Rectum, AJCC 8th Edition - Clinical stage from 02/04/2021: Stage IVB (cTX, cN2, cM1b) - Signed by Truitt Merle, MD on  02/12/2021 Stage prefix: Initial diagnosis   02/08/2021 Imaging   CT CHEST, ABDOMEN AND PELVIS W CONTRAST  IMPRESSION: 1. There is a large, concentric mass of the rectosigmoid junction measuring 7.8 x 5.9 x 5.2 cm. Findings are consistent with primary colon malignancy identified by colonoscopy. 2. Multiple enlarged perirectal, bilateral pelvic sidewall, and retroperitoneal lymph nodes, consistent with nodal metastatic disease. 3. There are multiple small low-attenuation lesions of the liver parenchyma, incompletely characterized although suspicious for hepatic metastatic disease. PET-CT or multiphasic contrast enhanced MRI could be used to more clearly characterize. 4. There are multiple small bilateral pulmonary nodules, measuring 3 mm and smaller, nonspecific although highly suspicious for early pulmonary metastatic disease given overall constellation of findings.   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 Intense hypermetabolic activity associated with rectal mass consistent with primary colorectal carcinoma. 2. Local extension of primary tumor into the LEFT perirectal fat. 3. Hypermetabolic LEFT operator node consistent with local nodal metastasis. 4. Hypermetabolic metastasis to the LEFT and RIGHT hepatic lobe (two lesions). 5. Hypermetabolic periportal lymph node. 6. Small pulmonary nodules without radiotracer activity. Recommend attention on follow-up.   03/13/2021 -  Chemotherapy    Patient is on Treatment Plan: COLORECTAL XELOX (CAPEOX) Q21D   Patient is on Antibody Plan: COLORECTAL BEVACIZUMAB Q21D     04/03/2021 -  Chemotherapy    Patient is on Treatment Plan: COLORECTAL XELOX (CAPEOX) Q21D   Patient is on Antibody Plan: COLORECTAL BEVACIZUMAB Q21D      Genetic Testing   Negative genetic testing. No pathogenic variants identified on the [facility] Multi-Cancer+RNA Panel. VUS in RECQL4  called c.3120G>A identified. The report date is 04/01/2021.  The Multi-Cancer Panel + RNA offered by [facility] includes sequencing and/or deletion duplication testing of the following 84 genes: AIP, ALK, APC, ATM, AXIN2,BAP1,  BARD1, BLM, BMPR1A, BRCA1, BRCA2, BRIP1, CASR, CDC73, CDH1, CDK4, CDKN1B, CDKN1C, CDKN2A (p14ARF), CDKN2A (p16INK4a), CEBPA, CHEK2, CTNNA1, DICER1, DIS3L2, EGFR (c.2369C>T, p.Thr790Met variant only), EPCAM (Deletion/duplication testing only), FH, FLCN, GATA2, GPC3, GREM1 (Promoter region deletion/duplication testing only), HOXB13 (c.251G>A, p.Gly84Glu), HRAS, KIT, MAX, MEN1, MET, MITF (c.952G>A, p.Glu318Lys variant only), MLH1, MSH2, MSH3, MSH6, MUTYH, NBN, NF1, NF2, NTHL1, PALB2, PDGFRA, PHOX2B, PMS2, POLD1, POLE, POT1, PRKAR1A, PTCH1, PTEN, RAD50, RAD51C, RAD51D, RB1, RECQL4, RET, RUNX1, SDHAF2, SDHA (sequence changes only), SDHB, SDHC, SDHD, SMAD4, SMARCA4, SMARCB1, SMARCE1, STK11, SUFU, TERC, TERT, TMEM127, TP53, TSC1, TSC2, VHL, WRN and WT1.     CURRENT THERAPY: CAPOX staring today   INTERVAL HISTORY: Pt is here for f/u and secondary dose chemo.  She tolerated first cycle well Her left hip pain has much improved, 1-2/10 now Still has frequent BM, she is eating and drinking well  Overall doing well   All other systems were reviewed with the patient and are negative.  MEDICAL HISTORY:  Past Medical History:  Diagnosis Date   Colorectal cancer ([place])    Family history of brain cancer    Family history of prostate cancer    Family history of stomach cancer    Heart murmur    dx at 26   MVP is stable   Hemorrhoids     SURGICAL HISTORY: Past Surgical History:  Procedure Laterality Date   NO PAST SURGERIES      I have reviewed the social history and family history with the patient and they are unchanged from previous note.  ALLERGIES:  is allergic to codeine, epinephrine (anaphylaxis), orange fruit [citrus], streptogramins, and tomato.  MEDICATIONS:  Current  Outpatient Medications  Medication Sig Dispense Refill   acetaminophen (TYLENOL) 325 MG tablet Take 650 mg by mouth every 6 (six) hours as needed.     capecitabine (XELODA) 500 MG tablet Take 2 tabs in morning and 3 tabs in evening every 12 hours. Take on days 1-14 of chemotherapy. Repeat every 21 days. 70 tablet 1   cholecalciferol (VITAMIN D3) 25 MCG (1000 UNIT) tablet Take 1,000 Units by mouth daily.     ondansetron (ZOFRAN) 8 MG tablet Take 1 tablet (8 mg total) by mouth 2 (two) times daily as needed for refractory nausea / vomiting. Start on day 3 after chemotherapy. 30 tablet 1   prochlorperazine (COMPAZINE) 10 MG tablet Take 1 tablet (10 mg total) by mouth every 6 (six) hours as needed (Nausea or vomiting). 30 tablet 1   traMADol (ULTRAM) 50 MG tablet Take 0.5-1 tablets (25-50 mg total) by mouth every 8 (eight) hours as needed. 30 tablet 0   VALERIAN ROOT PO Take by mouth.     vitamin C (ASCORBIC ACID) 500 MG tablet Take 500 mg by mouth daily.     No current facility-administered medications for this visit.    PHYSICAL EXAMINATION: ECOG PERFORMANCE STATUS: 1 - Symptomatic but completely ambulatory  Vitals:   04/03/21 0928  BP: 116/79  Pulse: 81  Resp: 18  Temp: 97.8 F (36.6 C)  SpO2: 97%    Filed Weights   04/03/21 0928  Weight: 113 lb 1.6 oz (51.3 kg)     GENERAL:alert, no distress and comfortable SKIN: skin color, texture, turgor are normal, no rashes or significant lesions EYES: normal, Conjunctiva  are pink and non-injected, sclera clear Musculoskeletal:no cyanosis of digits and no clubbing  NEURO: alert & oriented x 3 with fluent speech, no focal motor/sensory deficits  LABORATORY DATA:  I have reviewed the data as listed CBC Latest Ref Rng & Units 04/03/2021 03/13/2021 02/04/2021  WBC 4.0 - 10.5 K/uL 6.0 7.9 10.6(H)  Hemoglobin 12.0 - 15.0 g/dL 14.5 14.1 14.8  Hematocrit 36.0 - 46.0 % 41.4 40.6 43.5  Platelets 150 - 400 K/uL 132(L) 158 186.0     CMP Latest  Ref Rng & Units 04/03/2021 03/13/2021 02/04/2021  Glucose 70 - 99 mg/dL 123(H) 90 110(H)  BUN 6 - 20 mg/dL 10 9 12   Creatinine 0.44 - 1.00 mg/dL 0.71 0.64 0.57  Sodium 135 - 145 mmol/L 139 136 139  Potassium 3.5 - 5.1 mmol/L 3.9 4.3 4.1  Chloride 98 - 111 mmol/L 105 103 105  CO2 22 - 32 mmol/L 23 25 23   Calcium 8.9 - 10.3 mg/dL 9.6 9.9 9.4  Total Protein 6.5 - 8.1 g/dL 7.1 7.2 7.6  Total Bilirubin 0.3 - 1.2 mg/dL 0.7 0.7 0.8  Alkaline Phos 38 - 126 U/L 104 101 100  AST 15 - 41 U/L 43(H) 37 48(H)  ALT 0 - 44 U/L 67(H) 67(H) 71(H)      RADIOGRAPHIC STUDIES: I have personally reviewed the radiological images as listed and agreed with the findings in the report. No results found.   ASSESSMENT & PLAN:  Angela Mcpherson is a 60 y.o. 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  Goal of care discussion, DNR  -We again discussed the incurable nature of her cancer, and the overall poor prognosis, especially if she does not have good response to chemotherapy or progress on chemo -The patient understands the goal of care is palliative. -I recommend DNR/DNI, she agrees    Plan -Lab reviewed, adequate for treatment, will proceed second cycle Cape ox at the same dose, she started Xeloda this morning -Lab, follow-up and assist cycle in 3 weeks -no meds refill needed today   No orders of the defined types were placed in this encounter.  All questions were answered. The patient knows to call the clinic with any problems, questions or concerns. No barriers to learning was detected. I spent 25 minutes counseling the patient face to face. The total time spent in the  appointment was 30 minutes and more than 50% was on counseling and review of test results     Truitt Merle, MD 04/03/21

## 2021-04-02 NOTE — Progress Notes (Signed)
HPI:  Ms. Panik was previously seen in the [facility] due to a personal and family history of cancer and concerns regarding a hereditary predisposition to cancer. Please refer to our prior cancer genetics clinic note for more information regarding our discussion, assessment and recommendations, at the time. Ms. Neuroth recent genetic test results were disclosed to her, as were recommendations warranted by these results. These results and recommendations are discussed in more detail below.  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 Resected and retrieved. - A fungating, infiltrative and ulcerated partially obstructing mass was found in the rectosigmoid colon (distal edge about 7cm from the anus). The mass was partially circumferential (involving one-half of the lumen circumference). The mass measured eight cm in length. This was biopsied with a cold forceps for histology. The distal edge of the mass was tattooed with two submucosal injections of Spot (carbon black). Sigmoid colon felt somewhat fixed in place (large CRC or perhaps invasion into colon from other primary site?) - Internal hemorrhoids. - The examination was otherwise normal on direct and retroflexion views.   02/04/2021 Pathology Results   Diagnosis 1. 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 - NO HIGH-GRADE DYSPLASIA OR CARCINOMA. 2. Colon, biopsy, rectosigmoid - INVASIVE ADENOCARCINOMA. - SEE NOTE.   02/04/2021 Cancer  Staging   Staging form: Colon and Rectum, AJCC 8th Edition - Clinical stage from 02/04/2021: Stage IVB (cTX, cN2, cM1b) - Signed by Truitt Merle, MD on 02/12/2021 Stage prefix: Initial diagnosis   02/08/2021 Imaging   CT CHEST, ABDOMEN AND PELVIS W CONTRAST  IMPRESSION: 1. There is a large, concentric mass of the rectosigmoid junction measuring 7.8 x 5.9 x 5.2 cm. Findings are consistent with primary colon malignancy identified by colonoscopy. 2. Multiple enlarged perirectal, bilateral pelvic sidewall, and retroperitoneal lymph nodes, consistent with nodal metastatic disease. 3. There are multiple small low-attenuation lesions of the liver parenchyma, incompletely characterized although suspicious for hepatic metastatic disease. PET-CT or multiphasic contrast enhanced MRI could be used to more clearly characterize. 4. There are multiple small bilateral pulmonary nodules, measuring 3 mm and smaller, nonspecific although highly suspicious for early pulmonary metastatic disease given overall constellation of findings.   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 Intense hypermetabolic activity associated with rectal mass consistent with primary colorectal carcinoma. 2. Local extension of primary tumor into the LEFT perirectal fat. 3. Hypermetabolic LEFT operator node consistent with local nodal metastasis. 4. Hypermetabolic metastasis to the LEFT and RIGHT hepatic lobe (two lesions). 5. Hypermetabolic periportal lymph node. 6. Small pulmonary nodules without radiotracer activity. Recommend attention on follow-up.   03/13/2021 -  Chemotherapy    Patient is on Treatment Plan: COLORECTAL XELOX (CAPEOX) Q21D   Patient is on Antibody Plan: COLORECTAL BEVACIZUMAB Q21D     04/03/2021 -  Chemotherapy    Patient is on Treatment Plan: COLORECTAL XELOX (CAPEOX) Q21D   Patient is on Antibody Plan: COLORECTAL BEVACIZUMAB  Q21D  Genetic Testing   Negative genetic testing. No pathogenic variants identified on the [facility] Multi-Cancer+RNA Panel. 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 The report date is 04/01/2021.  The Multi-Cancer Panel + RNA offered by [facility] includes sequencing and/or deletion duplication testing of the following 84 genes: AIP, ALK, APC, ATM, AXIN2,BAP1,  BARD1, BLM, BMPR1A, BRCA1, BRCA2, BRIP1, CASR, CDC73, CDH1, CDK4, CDKN1B, CDKN1C, CDKN2A (p14ARF), CDKN2A (p16INK4a), CEBPA, CHEK2, CTNNA1, DICER1, DIS3L2, EGFR (c.2369C>T, p.Thr790Met variant only), EPCAM (Deletion/duplication testing only), FH, FLCN, GATA2, GPC3, GREM1 (Promoter region deletion/duplication testing only), HOXB13 (c.251G>A, p.Gly84Glu), HRAS, KIT, MAX, MEN1, MET, MITF (c.952G>A, p.Glu318Lys variant only), MLH1, MSH2, MSH3, MSH6, MUTYH, NBN, NF1, NF2, NTHL1, PALB2, PDGFRA, PHOX2B, PMS2, POLD1, POLE, POT1, PRKAR1A, PTCH1, PTEN, RAD50, RAD51C, RAD51D, RB1, RECQL4, RET, RUNX1, SDHAF2, SDHA (sequence changes only), SDHB, SDHC, SDHD, SMAD4, SMARCA4, SMARCB1, SMARCE1, STK11, SUFU, TERC, TERT, TMEM127, TP53, TSC1, TSC2, VHL, WRN and WT1.     FAMILY HISTORY:  We obtained a detailed, 4-generation family history.  Significant diagnoses are listed below: Family History  Problem Relation Age of Onset   Prostate cancer Father    Brain cancer Maternal Aunt    Prostate cancer Paternal Uncle    Stomach cancer Maternal Grandfather 66   Colon cancer Neg Hx    Colon polyps Neg Hx    Esophageal cancer Neg Hx    Rectal cancer Neg Hx    Ms. Truxillo does not have children. She has 6 full brothers, and several maternal half sisters/brothers. None have had cancer.   Ms. Casali mother possibly had breast cancer in her 31s but she is not sure, she died at 58. Patient had 9 maternal aunts/uncles. One aunt had brain cancer, unknown age of diagnosis/death. No other known cancers in maternal aunts/uncles/cousins. Maternal grandfather had stomach  cancer at 23 and died at 13. Maternal grandmother died in her 83s.   Ms. Luster father had prostate cancer in his 61s and died at 65. Patient had 11 paternal aunts/uncles. One uncle also had prostate cancer. No other known cancers in paternal aunts/uncles/cousins/grandparents, although she notes her grandmother died of "female issues" and is not sure what this meant.    Ms. Selke is unaware of previous family history of genetic testing for hereditary cancer risks. Patient's maternal ancestors are of [place]/Italian descent, and paternal ancestors are of Scotch/Irish/Italian descent. There is no reported Ashkenazi Jewish ancestry. There is no known consanguinity.     GENETIC TEST RESULTS: Genetic testing reported out on 04/01/2021 through the [facility] Multi-Cancer+RNA cancer panel found no pathogenic mutations.   The Multi-Cancer Panel + RNA offered by [facility] includes sequencing and/or deletion duplication testing of the following 84 genes: AIP, ALK, APC, ATM, AXIN2,BAP1,  BARD1, BLM, BMPR1A, BRCA1, BRCA2, BRIP1, CASR, CDC73, CDH1, CDK4, CDKN1B, CDKN1C, CDKN2A (p14ARF), CDKN2A (p16INK4a), CEBPA, CHEK2, CTNNA1, DICER1, DIS3L2, EGFR (c.2369C>T, p.Thr790Met variant only), EPCAM (Deletion/duplication testing only), FH, FLCN, GATA2, GPC3, GREM1 (Promoter region deletion/duplication testing only), HOXB13 (c.251G>A, p.Gly84Glu), HRAS, KIT, MAX, MEN1, MET, MITF (c.952G>A, p.Glu318Lys variant only), MLH1, MSH2, MSH3, MSH6, MUTYH, NBN, NF1, NF2, NTHL1, PALB2, PDGFRA, PHOX2B, PMS2, POLD1, POLE, POT1, PRKAR1A, PTCH1, PTEN, RAD50, RAD51C, RAD51D, RB1, RECQL4, RET, RUNX1, SDHAF2, SDHA (sequence changes only), SDHB, SDHC, SDHD, SMAD4, SMARCA4, SMARCB1, SMARCE1, STK11, SUFU, TERC, TERT, TMEM127, TP53, TSC1, TSC2, VHL, WRN and WT1.   The test report has been scanned into EPIC and is located under the Molecular Pathology section of the Results Review tab.  A portion of the result report is included below  for  reference.     We discussed that because current genetic testing is not perfect, it is possible there may be a gene mutation in one of these genes that current testing cannot detect, but that chance is small.  There could be another gene that has not yet been discovered, or that we have not yet tested, that is responsible for the cancer diagnoses in the family. It is also possible there is a hereditary cause for the cancer in the family that Ms. Oshana did not inherit and therefore was not identified in her testing.  Therefore, it is important to remain in touch with cancer genetics in the future so that we can continue to offer Ms. Rigdon the most up to date genetic testing.   Genetic testing did identify a variant of uncertain significance (VUS) in the RECQL4 gene called c.3120G>A.  At this time, it is unknown if this variant is associated with increased cancer risk or if this is a normal finding, but most variants such as this get reclassified to being inconsequential. It should not be used to make medical management decisions. With time, we suspect the lab will determine the significance of this variant, if any. If we do learn more about it we will try to contact Ms. Townsel to discuss it further. However, it is important to stay in touch with [place] periodically and keep the address and phone number up to date.  ADDITIONAL GENETIC TESTING: We discussed with Ms. Schaad that her genetic testing was fairly extensive.  If there are genes identified to increase cancer risk that can be analyzed in the future, we would be happy to discuss and coordinate this testing at that time.    CANCER SCREENING RECOMMENDATIONS: Ms. Elza test result is considered negative (normal).  This means that we have not identified a hereditary cause for her  personal and family history of cancer at this time. Most cancers happen by chance and this negative test suggests that her cancer may fall into this category.    While reassuring,  this does not definitively rule out a hereditary predisposition to cancer. It is still possible that there could be genetic mutations that are undetectable by current technology. There could be genetic mutations in genes that have not been tested or identified to increase cancer risk.  Therefore, it is recommended she continue to follow the cancer management and screening guidelines provided by her oncology and primary healthcare provider.   An individual's cancer risk and medical management are not determined by genetic test results alone. Overall cancer risk assessment incorporates additional factors, including personal medical history, family history, and any available genetic information that may result in a personalized plan for cancer prevention and surveillance.  RECOMMENDATIONS FOR FAMILY MEMBERS:  Relatives in this family might be at some increased risk of developing cancer, over the general population risk, simply due to the family history of cancer.  We recommended female relatives in this family have a yearly mammogram beginning at age 29, or 23 years younger than the earliest onset of cancer, an annual clinical breast exam, and perform monthly breast self-exams. Female relatives in this family should also have a gynecological exam as recommended by their primary provider.  All family members should be referred for colonoscopy starting at age 42.   FOLLOW-UP: Lastly, we discussed with Ms. Cullinane that cancer genetics is a rapidly advancing field and it is possible that new genetic tests will be appropriate for her and/or her family members  in the future. We encouraged her to remain in contact with cancer genetics on an annual basis so we can update her personal and family histories and let her know of advances in cancer genetics that may benefit this family.   Our contact number was provided. Ms. Leys questions were answered to her satisfaction, and she knows she is welcome to call us at anytime  with additional questions or concerns.   Faith Rogue, MS, [facility] Genetic Counselor [email or web address] Phone: [phone]

## 2021-04-02 NOTE — Telephone Encounter (Signed)
Revealed negative genetic testing.  Revealed that a VUS in RECQL4 was identified. This normal result is reassuring and indicates that it is unlikely Ms. Theisen's cancer is due to a hereditary cause.  It is unlikely that there is an increased risk of another cancer due to a mutation in one of these genes.  However, genetic testing is not perfect, and cannot definitively rule out a hereditary cause.  It will be important for her to keep in contact with genetics to learn if any additional testing may be needed in the future.

## 2021-04-03 ENCOUNTER — Encounter: Payer: Self-pay | Admitting: Hematology

## 2021-04-03 ENCOUNTER — Inpatient Hospital Stay: Payer: 59 | Attending: Hematology

## 2021-04-03 ENCOUNTER — Inpatient Hospital Stay: Payer: 59

## 2021-04-03 ENCOUNTER — Telehealth: Payer: Self-pay

## 2021-04-03 ENCOUNTER — Inpatient Hospital Stay: Payer: 59 | Admitting: Hematology

## 2021-04-03 ENCOUNTER — Other Ambulatory Visit: Payer: Self-pay

## 2021-04-03 VITALS — BP 116/79 | HR 81 | Temp 97.8°F | Resp 18 | Wt 113.1 lb

## 2021-04-03 VITALS — BP 103/74

## 2021-04-03 DIAGNOSIS — C19 Malignant neoplasm of rectosigmoid junction: Secondary | ICD-10-CM | POA: Diagnosis not present

## 2021-04-03 DIAGNOSIS — Z5112 Encounter for antineoplastic immunotherapy: Secondary | ICD-10-CM | POA: Diagnosis not present

## 2021-04-03 DIAGNOSIS — Z5111 Encounter for antineoplastic chemotherapy: Secondary | ICD-10-CM | POA: Insufficient documentation

## 2021-04-03 DIAGNOSIS — C787 Secondary malignant neoplasm of liver and intrahepatic bile duct: Secondary | ICD-10-CM | POA: Diagnosis not present

## 2021-04-03 LAB — CBC WITH DIFFERENTIAL (CANCER CENTER ONLY)
Abs Immature Granulocytes: 0.01 10*3/uL (ref 0.00–0.07)
Basophils Absolute: 0 10*3/uL (ref 0.0–0.1)
Basophils Relative: 0 %
Eosinophils Absolute: 0 10*3/uL (ref 0.0–0.5)
Eosinophils Relative: 1 %
HCT: 41.4 % (ref 36.0–46.0)
Hemoglobin: 14.5 g/dL (ref 12.0–15.0)
Immature Granulocytes: 0 %
Lymphocytes Relative: 28 %
Lymphs Abs: 1.7 10*3/uL (ref 0.7–4.0)
MCH: 32.3 pg (ref 26.0–34.0)
MCHC: 35 g/dL (ref 30.0–36.0)
MCV: 92.2 fL (ref 80.0–100.0)
Monocytes Absolute: 0.6 10*3/uL (ref 0.1–1.0)
Monocytes Relative: 10 %
Neutro Abs: 3.7 10*3/uL (ref 1.7–7.7)
Neutrophils Relative %: 61 %
Platelet Count: 132 10*3/uL — ABNORMAL LOW (ref 150–400)
RBC: 4.49 MIL/uL (ref 3.87–5.11)
RDW: 16.1 % — ABNORMAL HIGH (ref 11.5–15.5)
WBC Count: 6 10*3/uL (ref 4.0–10.5)
nRBC: 0 % (ref 0.0–0.2)

## 2021-04-03 LAB — CMP (CANCER CENTER ONLY)
ALT: 67 U/L — ABNORMAL HIGH (ref 0–44)
AST: 43 U/L — ABNORMAL HIGH (ref 15–41)
Albumin: 4 g/dL (ref 3.5–5.0)
Alkaline Phosphatase: 104 U/L (ref 38–126)
Anion gap: 11 (ref 5–15)
BUN: 10 mg/dL (ref 6–20)
CO2: 23 mmol/L (ref 22–32)
Calcium: 9.6 mg/dL (ref 8.9–10.3)
Chloride: 105 mmol/L (ref 98–111)
Creatinine: 0.71 mg/dL (ref 0.44–1.00)
GFR, Estimated: >60 mL/min (ref >60)
Glucose, Bld: 123 mg/dL — ABNORMAL HIGH (ref 70–99)
Potassium: 3.9 mmol/L (ref 3.5–5.1)
Sodium: 139 mmol/L (ref 135–145)
Total Bilirubin: 0.7 mg/dL (ref 0.3–1.2)
Total Protein: 7.1 g/dL (ref 6.5–8.1)

## 2021-04-03 LAB — TOTAL PROTEIN, URINE DIPSTICK: Protein, ur: NEGATIVE mg/dL

## 2021-04-03 LAB — CEA (IN HOUSE-CHCC): CEA (CHCC-In House): 412.29 ng/mL — ABNORMAL HIGH (ref 0.00–5.00)

## 2021-04-03 MED ORDER — SODIUM CHLORIDE 0.9 % IV SOLN
7.5000 mg/kg | Freq: Once | INTRAVENOUS | Status: AC
  Administered 2021-04-03: 400 mg via INTRAVENOUS
  Filled 2021-04-03: qty 16

## 2021-04-03 MED ORDER — SODIUM CHLORIDE 0.9 % IV SOLN: INTRAVENOUS | Status: DC

## 2021-04-03 MED ORDER — OXALIPLATIN CHEMO INJECTION 100 MG/20ML
200.0000 mg | Freq: Once | INTRAVENOUS | Status: AC
  Administered 2021-04-03: 200 mg via INTRAVENOUS
  Filled 2021-04-03: qty 40

## 2021-04-03 MED ORDER — OXALIPLATIN CHEMO INJECTION 100 MG/20ML: 130.0000 mg/m2 | Freq: Once | INTRAVENOUS | Status: DC

## 2021-04-03 MED ORDER — PALONOSETRON HCL INJECTION 0.25 MG/5ML
0.2500 mg | Freq: Once | INTRAVENOUS | Status: AC
Start: 2021-04-03 — End: 2021-04-03
  Administered 2021-04-03: 0.25 mg via INTRAVENOUS
  Filled 2021-04-03: qty 5

## 2021-04-03 MED ORDER — SODIUM CHLORIDE 0.9 % IV SOLN
10.0000 mg | Freq: Once | INTRAVENOUS | Status: AC
  Administered 2021-04-03: 10 mg via INTRAVENOUS
  Filled 2021-04-03: qty 10

## 2021-04-03 MED ORDER — DEXTROSE 5 % IV SOLN: Freq: Once | INTRAVENOUS | Status: AC

## 2021-04-03 NOTE — Telephone Encounter (Signed)
This patient called and stated that she was having some numbness in her lips and burning in her arm above her site where the IV was placed and she just wanted to be sure that these side effects were common for this treatment.  This nurse advised patient that the numbness in her lips should lessen or go away in between treatments.  Advised patient to apply a warm compress to her arms and keep herself warm.  Patient acknowledged understanding.   No further questions or concerns at this time.  Patient knows to call clinic if there are any other problems or concerns.

## 2021-04-03 NOTE — Patient Instructions (Signed)
[facility]  Discharge Instructions: Thank you for choosing [facility] to provide your oncology and hematology care.   If you have a lab appointment with the [facility], please go directly to the [facility] and check in at the registration area.   Wear comfortable clothing and clothing appropriate for easy access to any Portacath or PICC line.   We strive to give you quality time with your provider. You may need to reschedule your appointment if you arrive late (15 or more minutes).  Arriving late affects you and other patients whose appointments are after yours.  Also, if you miss three or more appointments without notifying the office, you may be dismissed from the clinic at the provider's discretion.      For prescription refill requests, have your pharmacy contact our office and allow 72 hours for refills to be completed.    Today you received the following chemotherapy and/or immunotherapy agents: Bevacizumab, Oxaliplatin     To help prevent nausea and vomiting after your treatment, we encourage you to take your nausea medication as directed.  BELOW ARE SYMPTOMS THAT SHOULD BE REPORTED IMMEDIATELY: *FEVER GREATER THAN 100.4 F (38 C) OR HIGHER *CHILLS OR SWEATING *NAUSEA AND VOMITING THAT IS NOT CONTROLLED WITH YOUR NAUSEA MEDICATION *UNUSUAL SHORTNESS OF BREATH *UNUSUAL BRUISING OR BLEEDING *URINARY PROBLEMS (pain or burning when urinating, or frequent urination) *BOWEL PROBLEMS (unusual diarrhea, constipation, pain near the anus) TENDERNESS IN MOUTH AND THROAT WITH OR WITHOUT PRESENCE OF ULCERS (sore throat, sores in mouth, or a toothache) UNUSUAL RASH, SWELLING OR PAIN  UNUSUAL VAGINAL DISCHARGE OR ITCHING   Items with * indicate a potential emergency and should be followed up as soon as possible or go to the Emergency Department if any problems should occur.  Please show the CHEMOTHERAPY ALERT CARD or IMMUNOTHERAPY ALERT CARD  at check-in to the Emergency Department and triage nurse.  Should you have questions after your visit or need to cancel or reschedule your appointment, please contact [facility]  Dept: [phone]  and follow the prompts.  Office hours are 8:00 a.m. to 4:30 p.m. Monday - Friday. Please note that voicemails left after 4:00 p.m. may not be returned until the following business day.  We are closed weekends and major holidays. You have access to a nurse at all times for urgent questions. Please call the main number to the clinic Dept: [phone] and follow the prompts.   For any non-urgent questions, you may also contact your provider using MyChart. We now offer e-Visits for anyone 58 and older to request care online for non-urgent symptoms. For details visit [email or web address].   Also download the MyChart app! Go to the app store, search "MyChart", open the app, select [facility], and log in with your MyChart username and password.  Due to Covid, a mask is required upon entering the hospital/clinic. If you do not have a mask, one will be given to you upon arrival. For doctor visits, patients may have 1 support person aged 20 or older with them. For treatment visits, patients cannot have anyone with them due to current Covid guidelines and our immunocompromised population.   Bevacizumab injection What is this medication? BEVACIZUMAB (be va SIZ yoo mab) is a monoclonal antibody. It is used to treatmany types of cancer. This medicine may be used for other purposes; ask your health care provider orpharmacist if you have questions. COMMON BRAND NAME(S): Avastin, MVASI, Zirabev What  should I tell my care team before I take this medication? They need to know if you have any of these conditions: diabetes heart disease high blood pressure history of coughing up blood prior anthracycline chemotherapy (e.g., doxorubicin, daunorubicin, epirubicin) recent or ongoing  radiation therapy recent or planning to have surgery stroke an unusual or allergic reaction to bevacizumab, hamster proteins, mouse proteins, other medicines, foods, dyes, or preservatives pregnant or trying to get pregnant breast-feeding How should I use this medication? This medicine is for infusion into a vein. It is given by a health careprofessional in a hospital or clinic setting. Talk to your pediatrician regarding the use of this medicine in children.Special care may be needed. Overdosage: If you think you have taken too much of this medicine contact apoison control center or emergency room at once. NOTE: This medicine is only for you. Do not share this medicine with others. What if I miss a dose? It is important not to miss your dose. Call your doctor or health careprofessional if you are unable to keep an appointment. What may interact with this medication? Interactions are not expected. This list may not describe all possible interactions. Give your health care provider a list of all the medicines, herbs, non-prescription drugs, or dietary supplements you use. Also tell them if you smoke, drink alcohol, or use illegaldrugs. Some items may interact with your medicine. What should I watch for while using this medication? Your condition will be monitored carefully while you are receiving this medicine. You will need important blood work and urine testing done while youare taking this medicine. This medicine may increase your risk to bruise or bleed. Call your doctor orhealth care professional if you notice any unusual bleeding. Before having surgery, talk to your health care provider to make sure it is ok. This drug can increase the risk of poor healing of your surgical site or wound. You will need to stop this drug for 28 days before surgery. After surgery, wait at least 28 days before restarting this drug. Make sure the surgical site or wound is healed enough before restarting this drug.  Talk to your health careprovider if questions. Do not become pregnant while taking this medicine or for 6 months after stopping it. Women should inform their doctor if they wish to become pregnant or think they might be pregnant. There is a potential for serious side effects to an unborn child. Talk to your health care professional or pharmacist for more information. Do not breast-feed an infant while taking this medicine andfor 6 months after the last dose. This medicine has caused ovarian failure in some women. This medicine may interfere with the ability to have a child. You should talk to your doctor orhealth care professional if you are concerned about your fertility. What side effects may I notice from receiving this medication? Side effects that you should report to your doctor or health care professionalas soon as possible: allergic reactions like skin rash, itching or hives, swelling of the face, lips, or tongue chest pain or chest tightness chills coughing up blood high fever seizures severe constipation signs and symptoms of bleeding such as bloody or black, tarry stools; red or dark-brown urine; spitting up blood or brown material that looks like coffee grounds; red spots on the skin; unusual bruising or bleeding from the eye, gums, or nose signs and symptoms of a blood clot such as breathing problems; chest pain; severe, sudden headache; pain, swelling, warmth in the leg signs and symptoms  of a stroke like changes in vision; confusion; trouble speaking or understanding; severe headaches; sudden numbness or weakness of the face, arm or leg; trouble walking; dizziness; loss of balance or coordination stomach pain sweating swelling of legs or ankles vomiting weight gain Side effects that usually do not require medical attention (report to yourdoctor or health care professional if they continue or are bothersome): back pain changes in taste decreased appetite dry  skin nausea tiredness This list may not describe all possible side effects. Call your doctor for medical advice about side effects. You may report side effects to FDA at1-800-FDA-1088. Where should I keep my medication? This drug is given in a hospital or clinic and will not be stored at home. NOTE: This sheet is a summary. It may not cover all possible information. If you have questions about this medicine, talk to your doctor, pharmacist, orhealth care provider.  2022 Elsevier/Gold Standard (2019-06-01 10:50:46)  Oxaliplatin Injection What is this medication? OXALIPLATIN (ox AL i PLA tin) is a chemotherapy drug. It targets fast dividing cells, like cancer cells, and causes these cells to die. This medicine is usedto treat cancers of the colon and rectum, and many other cancers. This medicine may be used for other purposes; ask your health care provider orpharmacist if you have questions. COMMON BRAND NAME(S): Eloxatin What should I tell my care team before I take this medication? They need to know if you have any of these conditions: heart disease history of irregular heartbeat liver disease low blood counts, like white cells, platelets, or red blood cells lung or breathing disease, like asthma take medicines that treat or prevent blood clots tingling of the fingers or toes, or other nerve disorder an unusual or allergic reaction to oxaliplatin, other chemotherapy, other medicines, foods, dyes, or preservatives pregnant or trying to get pregnant breast-feeding How should I use this medication? This drug is given as an infusion into a vein. It is administered in a hospitalor clinic by a specially trained health care professional. Talk to your pediatrician regarding the use of this medicine in children.Special care may be needed. Overdosage: If you think you have taken too much of this medicine contact apoison control center or emergency room at once. NOTE: This medicine is only for  you. Do not share this medicine with others. What if I miss a dose? It is important not to miss a dose. Call your doctor or health careprofessional if you are unable to keep an appointment. What may interact with this medication? Do not take this medicine with any of the following medications: cisapride dronedarone pimozide thioridazine This medicine may also interact with the following medications: aspirin and aspirin-like medicines certain medicines that treat or prevent blood clots like warfarin, apixaban, dabigatran, and rivaroxaban cisplatin cyclosporine diuretics medicines for infection like acyclovir, adefovir, amphotericin B, bacitracin, cidofovir, foscarnet, ganciclovir, gentamicin, pentamidine, vancomycin NSAIDs, medicines for pain and inflammation, like ibuprofen or naproxen other medicines that prolong the QT interval (an abnormal heart rhythm) pamidronate zoledronic acid This list may not describe all possible interactions. Give your health care provider a list of all the medicines, herbs, non-prescription drugs, or dietary supplements you use. Also tell them if you smoke, drink alcohol, or use illegaldrugs. Some items may interact with your medicine. What should I watch for while using this medication? Your condition will be monitored carefully while you are receiving thismedicine. You may need blood work done while you are taking this medicine. This medicine may make you feel generally unwell.  This is not uncommon as chemotherapy can affect healthy cells as well as cancer cells. Report any side effects. Continue your course of treatment even though you feel ill unless yourhealthcare professional tells you to stop. This medicine can make you more sensitive to cold. Do not drink cold drinks or use ice. Cover exposed skin before coming in contact with cold temperatures or cold objects. When out in cold weather wear warm clothing and cover your mouth and nose to warm the air that  goes into your lungs. Tell your doctor if you getsensitive to the cold. Do not become pregnant while taking this medicine or for 9 months after stopping it. Women should inform their health care professional if they wish to become pregnant or think they might be pregnant. Men should not father a child while taking this medicine and for 6 months after stopping it. There is potential for serious side effects to an unborn child. Talk to your health careprofessional for more information. Do not breast-feed a child while taking this medicine or for 3 months afterstopping it. This medicine has caused ovarian failure in some women. This medicine may make it more difficult to get pregnant. Talk to your health care professional if [place] concerned about your fertility. This medicine has caused decreased sperm counts in some men. This may make it more difficult to father a child. Talk to your health care professional if [place] concerned about your fertility. This medicine may increase your risk of getting an infection. Call your health care professional for advice if you get a fever, chills, or sore throat, or other symptoms of a cold or flu. Do not treat yourself. Try to avoid beingaround people who are sick. Avoid taking medicines that contain aspirin, acetaminophen, ibuprofen, naproxen, or ketoprofen unless instructed by your health care professional.These medicines may hide a fever. Be careful brushing or flossing your teeth or using a toothpick because you may get an infection or bleed more easily. If you have any dental work done, Primary school teacher you are receiving this medicine. What side effects may I notice from receiving this medication? Side effects that you should report to your doctor or health care professionalas soon as possible: allergic reactions like skin rash, itching or hives, swelling of the face, lips, or tongue breathing problems cough low blood counts - this medicine may decrease the  number of white blood cells, red blood cells, and platelets. You may be at increased risk for infections and bleeding nausea, vomiting pain, redness, or irritation at site where injected pain, tingling, numbness in the hands or feet signs and symptoms of bleeding such as bloody or black, tarry stools; red or dark brown urine; spitting up blood or brown material that looks like coffee grounds; red spots on the skin; unusual bruising or bleeding from the eyes, gums, or nose signs and symptoms of a dangerous change in heartbeat or heart rhythm like chest pain; dizziness; fast, irregular heartbeat; palpitations; feeling faint or lightheaded; falls signs and symptoms of infection like fever; chills; cough; sore throat; pain or trouble passing urine signs and symptoms of liver injury like dark yellow or brown urine; general ill feeling or flu-like symptoms; light-colored stools; loss of appetite; nausea; right upper belly pain; unusually weak or tired; yellowing of the eyes or skin signs and symptoms of low red blood cells or anemia such as unusually weak or tired; feeling faint or lightheaded; falls signs and symptoms of muscle injury like dark urine; trouble passing urine or change  in the amount of urine; unusually weak or tired; muscle pain; back pain Side effects that usually do not require medical attention (report to yourdoctor or health care professional if they continue or are bothersome): changes in taste diarrhea gas hair loss loss of appetite mouth sores This list may not describe all possible side effects. Call your doctor for medical advice about side effects. You may report side effects to FDA at1-800-FDA-1088. Where should I keep my medication? This drug is given in a hospital or clinic and will not be stored at home. NOTE: This sheet is a summary. It may not cover all possible information. If you have questions about this medicine, talk to your doctor, pharmacist, orhealth care  provider.  2022 Elsevier/Gold Standard (2018-12-22 12:20:35)

## 2021-04-03 NOTE — Progress Notes (Signed)
Met with patient and spouse in waiting area before appointment to introduce myself as Arboriculturist and to offer available resources.  Discussed one-time $1000 Radio broadcast assistant to assist with personal expenses while going through treatment. Advised what is needed to apply.  Asked about ded/OOP to determine if copay assistance may be needed. She states she thinks she has met everything for the year.  Advised to call before leaving if able to provide needed documentation for grant.    Gave them my card if interested in applying and for any additional financial questions or concerns.  They called and states unable to retrieve documents at the time and patient wasn't feeling well so they went home.  Advised to call at a later date and time to schedule appointment to complete process and provide information. They verbalized understanding.

## 2021-04-03 NOTE — Progress Notes (Signed)
Patient tolerated cycle 1 well - proceed with full dose Oxaliplatin today per Dr. Burr Medico.  Raul Del [place], [place], BCPS, BCOP 04/03/2021 10:15 AM

## 2021-04-07 ENCOUNTER — Encounter: Payer: Self-pay | Admitting: Hematology

## 2021-04-10 ENCOUNTER — Ambulatory Visit: Payer: 59 | Admitting: Dietician

## 2021-04-10 ENCOUNTER — Telehealth: Payer: Self-pay | Admitting: Dietician

## 2021-04-10 NOTE — Telephone Encounter (Signed)
Nutrition  Attempted to contact patient via telephone for nutrition follow-up. Patient did no answer. Left message on VM with request for return call. Contact information provided.

## 2021-04-11 ENCOUNTER — Encounter: Payer: Self-pay | Admitting: Hematology

## 2021-04-11 NOTE — Progress Notes (Signed)
Nutrition Follow-up:  Patient receiving CAPOX for metastatic colon cancer.  Received return call from patient. She reports "doing great" and appreciative of call. Patient says she is grateful for nutrition appointment with Pamala Hurry, states she is "magic." She is drinking Enterade once daily, increases to twice daily before infusion, says this has helped tremendously. Patient reports her pain has significantly improved and having fewer episodes of diarrhea. Patient is taking liquid Imodium to control dose, reports 1/2 capful. Patient reports she is "gassy" from oral chemo. Patient is following low fiber diet, eating small meals throughout the day and is committed to eating healthy. She misses brussels sprouts. Patient is drinking 1 Boost Breeze daily. Patient reports her weights fluctuate, 110 lb pounds is normal for her.   Medications: xeloda, compazine, zofran  Labs: 8/17 - Glucose 123  Anthropometrics: Weight 113 lb 1.6 oz on 8/17 increased from 110. 2 lbs on 7/13   NUTRITION DIAGNOSIS: Unintended weight loss stable   INTERVENTION:  Continue eating small, frequent meals and snacks with high calorie, high protein foods as tolerated Continue eating low fiber foods Continue drinking Boost Breeze daily for added calories and protein Continue Imodium as needed Patient is consuming Enterade once daily with improvement to symptoms Patient has contact information    MONITORING, EVALUATION, GOAL: weight trends, intake   NEXT VISIT: Wednesday September 7 in infusion

## 2021-04-23 MED FILL — Dexamethasone Sodium Phosphate Inj 100 MG/10ML: INTRAMUSCULAR | Qty: 1 | Status: AC

## 2021-04-24 ENCOUNTER — Encounter: Payer: Self-pay | Admitting: Hematology

## 2021-04-24 ENCOUNTER — Inpatient Hospital Stay: Payer: 59 | Attending: Hematology

## 2021-04-24 ENCOUNTER — Inpatient Hospital Stay: Payer: 59 | Admitting: Dietician

## 2021-04-24 ENCOUNTER — Other Ambulatory Visit: Payer: Self-pay

## 2021-04-24 ENCOUNTER — Inpatient Hospital Stay: Payer: 59 | Admitting: Hematology

## 2021-04-24 ENCOUNTER — Inpatient Hospital Stay (HOSPITAL_BASED_OUTPATIENT_CLINIC_OR_DEPARTMENT_OTHER): Payer: 59

## 2021-04-24 VITALS — BP 115/80 | HR 88 | Temp 98.0°F | Resp 18 | Wt 112.6 lb

## 2021-04-24 DIAGNOSIS — C19 Malignant neoplasm of rectosigmoid junction: Secondary | ICD-10-CM | POA: Insufficient documentation

## 2021-04-24 DIAGNOSIS — Z5112 Encounter for antineoplastic immunotherapy: Secondary | ICD-10-CM | POA: Insufficient documentation

## 2021-04-24 DIAGNOSIS — Z5111 Encounter for antineoplastic chemotherapy: Secondary | ICD-10-CM | POA: Insufficient documentation

## 2021-04-24 DIAGNOSIS — C7800 Secondary malignant neoplasm of unspecified lung: Secondary | ICD-10-CM | POA: Insufficient documentation

## 2021-04-24 DIAGNOSIS — C787 Secondary malignant neoplasm of liver and intrahepatic bile duct: Secondary | ICD-10-CM | POA: Insufficient documentation

## 2021-04-24 LAB — CBC WITH DIFFERENTIAL (CANCER CENTER ONLY)
Abs Immature Granulocytes: 0.01 10*3/uL (ref 0.00–0.07)
Basophils Absolute: 0 10*3/uL (ref 0.0–0.1)
Basophils Relative: 1 %
Eosinophils Absolute: 0.1 10*3/uL (ref 0.0–0.5)
Eosinophils Relative: 1 %
HCT: 38.5 % (ref 36.0–46.0)
Hemoglobin: 13.5 g/dL (ref 12.0–15.0)
Immature Granulocytes: 0 %
Lymphocytes Relative: 32 %
Lymphs Abs: 1.4 10*3/uL (ref 0.7–4.0)
MCH: 33.2 pg (ref 26.0–34.0)
MCHC: 35.1 g/dL (ref 30.0–36.0)
MCV: 94.6 fL (ref 80.0–100.0)
Monocytes Absolute: 0.6 10*3/uL (ref 0.1–1.0)
Monocytes Relative: 13 %
Neutro Abs: 2.3 10*3/uL (ref 1.7–7.7)
Neutrophils Relative %: 53 %
Platelet Count: 125 10*3/uL — ABNORMAL LOW (ref 150–400)
RBC: 4.07 MIL/uL (ref 3.87–5.11)
RDW: 17.8 % — ABNORMAL HIGH (ref 11.5–15.5)
WBC Count: 4.4 10*3/uL (ref 4.0–10.5)
nRBC: 0 % (ref 0.0–0.2)

## 2021-04-24 LAB — CMP (CANCER CENTER ONLY)
ALT: 53 U/L — ABNORMAL HIGH (ref 0–44)
AST: 39 U/L (ref 15–41)
Albumin: 4 g/dL (ref 3.5–5.0)
Alkaline Phosphatase: 100 U/L (ref 38–126)
Anion gap: 10 (ref 5–15)
BUN: 10 mg/dL (ref 6–20)
CO2: 22 mmol/L (ref 22–32)
Calcium: 9.7 mg/dL (ref 8.9–10.3)
Chloride: 105 mmol/L (ref 98–111)
Creatinine: 0.69 mg/dL (ref 0.44–1.00)
GFR, Estimated: >60 mL/min (ref >60)
Glucose, Bld: 108 mg/dL — ABNORMAL HIGH (ref 70–99)
Potassium: 4 mmol/L (ref 3.5–5.1)
Sodium: 137 mmol/L (ref 135–145)
Total Bilirubin: 0.9 mg/dL (ref 0.3–1.2)
Total Protein: 6.9 g/dL (ref 6.5–8.1)

## 2021-04-24 LAB — CEA (IN HOUSE-CHCC): CEA (CHCC-In House): 93.82 ng/mL — ABNORMAL HIGH (ref 0.00–5.00)

## 2021-04-24 LAB — TOTAL PROTEIN, URINE DIPSTICK: Protein, ur: NEGATIVE mg/dL

## 2021-04-24 MED ORDER — SODIUM CHLORIDE 0.9 % IV SOLN: Freq: Once | INTRAVENOUS | Status: AC

## 2021-04-24 MED ORDER — SODIUM CHLORIDE 0.9 % IV SOLN
10.0000 mg | Freq: Once | INTRAVENOUS | Status: AC
  Administered 2021-04-24: 10 mg via INTRAVENOUS
  Filled 2021-04-24: qty 10

## 2021-04-24 MED ORDER — SODIUM CHLORIDE 0.9 % IV SOLN
7.5000 mg/kg | Freq: Once | INTRAVENOUS | Status: AC
  Administered 2021-04-24: 400 mg via INTRAVENOUS
  Filled 2021-04-24: qty 16

## 2021-04-24 MED ORDER — DEXTROSE 5 % IV SOLN: Freq: Once | INTRAVENOUS | Status: AC

## 2021-04-24 MED ORDER — PALONOSETRON HCL INJECTION 0.25 MG/5ML
0.2500 mg | Freq: Once | INTRAVENOUS | Status: AC
  Administered 2021-04-24: 0.25 mg via INTRAVENOUS
  Filled 2021-04-24: qty 5

## 2021-04-24 MED ORDER — OXALIPLATIN CHEMO INJECTION 100 MG/20ML
133.0000 mg/m2 | Freq: Once | INTRAVENOUS | Status: AC
  Administered 2021-04-24: 200 mg via INTRAVENOUS
  Filled 2021-04-24: qty 40

## 2021-04-24 NOTE — Progress Notes (Signed)
Met with patient and spouse at registration to complete grant paperwork.  Patient approved for one-time $1000 [employer] grant to assist with personal expenses while going through treatment. Discussed in detail expenses and how they are covered. She has a copy of the approval letter and expense sheet along with the Outpatient pharmacy information. She received a gift card today from her grant.  She has paperwork and my card in green folder for any additional financial questions or concerns.

## 2021-04-24 NOTE — Progress Notes (Signed)
Nutrition Follow-up:  Patient receiving CAPOX for metastatic colon cancer.   Met with patient during infusion. She reports doing well, she has a good appetite and eating 5-6 small meals daily. Patient reports eating slice of pizza and chicken wings the other day and tolerated well. Patient denies nausea, vomiting, reports episodes of diarrhea the last few days which she feels is due to the oral chemo. She is taking liquid Imodium and this works well. Patient continues to drink Enterade, but not everyday. Patient reports she is looking forward to trying soft fruits and vegetables after discussing with MD today.   Medications: xeloda, compazine, zofran  Labs: Glucose 108, ALT 53  Anthropometrics: Weight 112 lb 9.6 oz today stable  8/17 - 113 lb 1.6 oz 7/13 - 110.2 lbs    NUTRITION DIAGNOSIS: Unintentional weight loss stable   INTERVENTION:  Continue eating small frequent meals and snacks with high calorie, high protein foods as tolerated Continue drinking Boost Breeze daily for added calories and protein Continue drinking Enterade as needed  Continue Imodium for diarrhea Recommended trying one new food at a time, slowly increasing foods with fiber  Patient has contact information  MONITORING, EVALUATION, GOAL: weight trends, oral intake   NEXT VISIT: To be scheduled as needed, patient has contact information

## 2021-04-24 NOTE — Patient Instructions (Signed)
[facility]  Discharge Instructions: Thank you for choosing [facility] to provide your oncology and hematology care.   If you have a lab appointment with the [facility], please go directly to the [facility] and check in at the registration area.   Wear comfortable clothing and clothing appropriate for easy access to any Portacath or PICC line.   We strive to give you quality time with your provider. You may need to reschedule your appointment if you arrive late (15 or more minutes).  Arriving late affects you and other patients whose appointments are after yours.  Also, if you miss three or more appointments without notifying the office, you may be dismissed from the clinic at the provider's discretion.      For prescription refill requests, have your pharmacy contact our office and allow 72 hours for refills to be completed.    Today you received the following chemotherapy and/or immunotherapy agents: Bevacizumab, Oxaliplatin     To help prevent nausea and vomiting after your treatment, we encourage you to take your nausea medication as directed.  BELOW ARE SYMPTOMS THAT SHOULD BE REPORTED IMMEDIATELY: *FEVER GREATER THAN 100.4 F (38 C) OR HIGHER *CHILLS OR SWEATING *NAUSEA AND VOMITING THAT IS NOT CONTROLLED WITH YOUR NAUSEA MEDICATION *UNUSUAL SHORTNESS OF BREATH *UNUSUAL BRUISING OR BLEEDING *URINARY PROBLEMS (pain or burning when urinating, or frequent urination) *BOWEL PROBLEMS (unusual diarrhea, constipation, pain near the anus) TENDERNESS IN MOUTH AND THROAT WITH OR WITHOUT PRESENCE OF ULCERS (sore throat, sores in mouth, or a toothache) UNUSUAL RASH, SWELLING OR PAIN  UNUSUAL VAGINAL DISCHARGE OR ITCHING   Items with * indicate a potential emergency and should be followed up as soon as possible or go to the Emergency Department if any problems should occur.  Please show the CHEMOTHERAPY ALERT CARD or IMMUNOTHERAPY ALERT CARD  at check-in to the Emergency Department and triage nurse.  Should you have questions after your visit or need to cancel or reschedule your appointment, please contact [facility]  Dept: [phone]  and follow the prompts.  Office hours are 8:00 a.m. to 4:30 p.m. Monday - Friday. Please note that voicemails left after 4:00 p.m. may not be returned until the following business day.  We are closed weekends and major holidays. You have access to a nurse at all times for urgent questions. Please call the main number to the clinic Dept: [phone] and follow the prompts.   For any non-urgent questions, you may also contact your provider using MyChart. We now offer e-Visits for anyone 59 and older to request care online for non-urgent symptoms. For details visit [email or web address].   Also download the MyChart app! Go to the app store, search "MyChart", open the app, select [facility], and log in with your MyChart username and password.  Due to Covid, a mask is required upon entering the hospital/clinic. If you do not have a mask, one will be given to you upon arrival. For doctor visits, patients may have 1 support person aged 27 or older with them. For treatment visits, patients cannot have anyone with them due to current Covid guidelines and our immunocompromised population.   Bevacizumab injection What is this medication? BEVACIZUMAB (be va SIZ yoo mab) is a monoclonal antibody. It is used to treatmany types of cancer. This medicine may be used for other purposes; ask your health care provider orpharmacist if you have questions. COMMON BRAND NAME(S): Avastin, MVASI, Zirabev What  should I tell my care team before I take this medication? They need to know if you have any of these conditions: diabetes heart disease high blood pressure history of coughing up blood prior anthracycline chemotherapy (e.g., doxorubicin, daunorubicin, epirubicin) recent or ongoing  radiation therapy recent or planning to have surgery stroke an unusual or allergic reaction to bevacizumab, hamster proteins, mouse proteins, other medicines, foods, dyes, or preservatives pregnant or trying to get pregnant breast-feeding How should I use this medication? This medicine is for infusion into a vein. It is given by a health careprofessional in a hospital or clinic setting. Talk to your pediatrician regarding the use of this medicine in children.Special care may be needed. Overdosage: If you think you have taken too much of this medicine contact apoison control center or emergency room at once. NOTE: This medicine is only for you. Do not share this medicine with others. What if I miss a dose? It is important not to miss your dose. Call your doctor or health careprofessional if you are unable to keep an appointment. What may interact with this medication? Interactions are not expected. This list may not describe all possible interactions. Give your health care provider a list of all the medicines, herbs, non-prescription drugs, or dietary supplements you use. Also tell them if you smoke, drink alcohol, or use illegaldrugs. Some items may interact with your medicine. What should I watch for while using this medication? Your condition will be monitored carefully while you are receiving this medicine. You will need important blood work and urine testing done while youare taking this medicine. This medicine may increase your risk to bruise or bleed. Call your doctor orhealth care professional if you notice any unusual bleeding. Before having surgery, talk to your health care provider to make sure it is ok. This drug can increase the risk of poor healing of your surgical site or wound. You will need to stop this drug for 28 days before surgery. After surgery, wait at least 28 days before restarting this drug. Make sure the surgical site or wound is healed enough before restarting this drug.  Talk to your health careprovider if questions. Do not become pregnant while taking this medicine or for 6 months after stopping it. Women should inform their doctor if they wish to become pregnant or think they might be pregnant. There is a potential for serious side effects to an unborn child. Talk to your health care professional or pharmacist for more information. Do not breast-feed an infant while taking this medicine andfor 6 months after the last dose. This medicine has caused ovarian failure in some women. This medicine may interfere with the ability to have a child. You should talk to your doctor orhealth care professional if you are concerned about your fertility. What side effects may I notice from receiving this medication? Side effects that you should report to your doctor or health care professionalas soon as possible: allergic reactions like skin rash, itching or hives, swelling of the face, lips, or tongue chest pain or chest tightness chills coughing up blood high fever seizures severe constipation signs and symptoms of bleeding such as bloody or black, tarry stools; red or dark-brown urine; spitting up blood or brown material that looks like coffee grounds; red spots on the skin; unusual bruising or bleeding from the eye, gums, or nose signs and symptoms of a blood clot such as breathing problems; chest pain; severe, sudden headache; pain, swelling, warmth in the leg signs and symptoms  of a stroke like changes in vision; confusion; trouble speaking or understanding; severe headaches; sudden numbness or weakness of the face, arm or leg; trouble walking; dizziness; loss of balance or coordination stomach pain sweating swelling of legs or ankles vomiting weight gain Side effects that usually do not require medical attention (report to yourdoctor or health care professional if they continue or are bothersome): back pain changes in taste decreased appetite dry  skin nausea tiredness This list may not describe all possible side effects. Call your doctor for medical advice about side effects. You may report side effects to FDA at1-800-FDA-1088. Where should I keep my medication? This drug is given in a hospital or clinic and will not be stored at home. NOTE: This sheet is a summary. It may not cover all possible information. If you have questions about this medicine, talk to your doctor, pharmacist, orhealth care provider.  2022 Elsevier/Gold Standard (2019-06-01 10:50:46)  Oxaliplatin Injection What is this medication? OXALIPLATIN (ox AL i PLA tin) is a chemotherapy drug. It targets fast dividing cells, like cancer cells, and causes these cells to die. This medicine is usedto treat cancers of the colon and rectum, and many other cancers. This medicine may be used for other purposes; ask your health care provider orpharmacist if you have questions. COMMON BRAND NAME(S): Eloxatin What should I tell my care team before I take this medication? They need to know if you have any of these conditions: heart disease history of irregular heartbeat liver disease low blood counts, like white cells, platelets, or red blood cells lung or breathing disease, like asthma take medicines that treat or prevent blood clots tingling of the fingers or toes, or other nerve disorder an unusual or allergic reaction to oxaliplatin, other chemotherapy, other medicines, foods, dyes, or preservatives pregnant or trying to get pregnant breast-feeding How should I use this medication? This drug is given as an infusion into a vein. It is administered in a hospitalor clinic by a specially trained health care professional. Talk to your pediatrician regarding the use of this medicine in children.Special care may be needed. Overdosage: If you think you have taken too much of this medicine contact apoison control center or emergency room at once. NOTE: This medicine is only for  you. Do not share this medicine with others. What if I miss a dose? It is important not to miss a dose. Call your doctor or health careprofessional if you are unable to keep an appointment. What may interact with this medication? Do not take this medicine with any of the following medications: cisapride dronedarone pimozide thioridazine This medicine may also interact with the following medications: aspirin and aspirin-like medicines certain medicines that treat or prevent blood clots like warfarin, apixaban, dabigatran, and rivaroxaban cisplatin cyclosporine diuretics medicines for infection like acyclovir, adefovir, amphotericin B, bacitracin, cidofovir, foscarnet, ganciclovir, gentamicin, pentamidine, vancomycin NSAIDs, medicines for pain and inflammation, like ibuprofen or naproxen other medicines that prolong the QT interval (an abnormal heart rhythm) pamidronate zoledronic acid This list may not describe all possible interactions. Give your health care provider a list of all the medicines, herbs, non-prescription drugs, or dietary supplements you use. Also tell them if you smoke, drink alcohol, or use illegaldrugs. Some items may interact with your medicine. What should I watch for while using this medication? Your condition will be monitored carefully while you are receiving thismedicine. You may need blood work done while you are taking this medicine. This medicine may make you feel generally unwell.  This is not uncommon as chemotherapy can affect healthy cells as well as cancer cells. Report any side effects. Continue your course of treatment even though you feel ill unless yourhealthcare professional tells you to stop. This medicine can make you more sensitive to cold. Do not drink cold drinks or use ice. Cover exposed skin before coming in contact with cold temperatures or cold objects. When out in cold weather wear warm clothing and cover your mouth and nose to warm the air that  goes into your lungs. Tell your doctor if you getsensitive to the cold. Do not become pregnant while taking this medicine or for 9 months after stopping it. Women should inform their health care professional if they wish to become pregnant or think they might be pregnant. Men should not father a child while taking this medicine and for 6 months after stopping it. There is potential for serious side effects to an unborn child. Talk to your health careprofessional for more information. Do not breast-feed a child while taking this medicine or for 3 months afterstopping it. This medicine has caused ovarian failure in some women. This medicine may make it more difficult to get pregnant. Talk to your health care professional if [place] concerned about your fertility. This medicine has caused decreased sperm counts in some men. This may make it more difficult to father a child. Talk to your health care professional if [place] concerned about your fertility. This medicine may increase your risk of getting an infection. Call your health care professional for advice if you get a fever, chills, or sore throat, or other symptoms of a cold or flu. Do not treat yourself. Try to avoid beingaround people who are sick. Avoid taking medicines that contain aspirin, acetaminophen, ibuprofen, naproxen, or ketoprofen unless instructed by your health care professional.These medicines may hide a fever. Be careful brushing or flossing your teeth or using a toothpick because you may get an infection or bleed more easily. If you have any dental work done, Primary school teacher you are receiving this medicine. What side effects may I notice from receiving this medication? Side effects that you should report to your doctor or health care professionalas soon as possible: allergic reactions like skin rash, itching or hives, swelling of the face, lips, or tongue breathing problems cough low blood counts - this medicine may decrease the  number of white blood cells, red blood cells, and platelets. You may be at increased risk for infections and bleeding nausea, vomiting pain, redness, or irritation at site where injected pain, tingling, numbness in the hands or feet signs and symptoms of bleeding such as bloody or black, tarry stools; red or dark brown urine; spitting up blood or brown material that looks like coffee grounds; red spots on the skin; unusual bruising or bleeding from the eyes, gums, or nose signs and symptoms of a dangerous change in heartbeat or heart rhythm like chest pain; dizziness; fast, irregular heartbeat; palpitations; feeling faint or lightheaded; falls signs and symptoms of infection like fever; chills; cough; sore throat; pain or trouble passing urine signs and symptoms of liver injury like dark yellow or brown urine; general ill feeling or flu-like symptoms; light-colored stools; loss of appetite; nausea; right upper belly pain; unusually weak or tired; yellowing of the eyes or skin signs and symptoms of low red blood cells or anemia such as unusually weak or tired; feeling faint or lightheaded; falls signs and symptoms of muscle injury like dark urine; trouble passing urine or change  in the amount of urine; unusually weak or tired; muscle pain; back pain Side effects that usually do not require medical attention (report to yourdoctor or health care professional if they continue or are bothersome): changes in taste diarrhea gas hair loss loss of appetite mouth sores This list may not describe all possible side effects. Call your doctor for medical advice about side effects. You may report side effects to FDA at1-800-FDA-1088. Where should I keep my medication? This drug is given in a hospital or clinic and will not be stored at home. NOTE: This sheet is a summary. It may not cover all possible information. If you have questions about this medicine, talk to your doctor, pharmacist, orhealth care  provider.  2022 Elsevier/Gold Standard (2018-12-22 12:20:35)

## 2021-04-24 NOTE — Progress Notes (Signed)
[place]   Telephone:(336) [phone] Fax:(336) [phone]   Clinic Follow up Note   Patient Care Team: Pcp, No as PCP - General Truitt Merle, MD as Consulting Physician (Oncology) Milus Banister, MD as Attending Physician (Gastroenterology) Kyung Rudd, MD as Consulting Physician (Radiation Oncology) 04/24/2021  CHIEF COMPLAINT: f/u metastatic colon cancer   ASSESSMENT & PLAN:  Angela Mcpherson is a 60 y.o. 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  Her rectal symptom also improved. -Plan to repeat a CT scan after 4 cycles of treatment   2.  Goal of care discussion -We previously discussed the incurable nature of her cancer, and the overall poor prognosis, especially if she does not have good response to chemotherapy or progress on chemo -The patient understands the goal of care is palliative. -she previously agreed with DNR, but there wishes to revers to full code now, since she is doing better with chemotherapy.  I think that is reasonable.   Plan -Lab reviewed, adequate for treatment, will proceed cycle 3 Cape ox and bevacizumab at full dose today. -Lab, follow-up and cycle 4 chemo in 3 weeks -Plan to repeat a CT scan before cycle 5    SUMMARY OF ONCOLOGIC HISTORY: Oncology History Overview Note  Cancer Staging Malignant neoplasm of rectosigmoid junction [facility]) Staging form: Colon and Rectum, AJCC 8th Edition - Clinical stage from 02/04/2021: Stage IVB (cTX, cN2, cM1b) - Signed by Truitt Merle, MD  on 02/12/2021 Stage prefix: Initial diagnosis    Malignant neoplasm of rectosigmoid junction ([place])  02/04/2021 Procedure   Colonoscopy by Dr Ardis Hughs 02/04/21 IMPRESSION - Two 2 to 6 mm polyps in the sigmoid colon and in the cecum, removed with a cold snare. Resected and retrieved. - A fungating, infiltrative and ulcerated partially obstructing mass was found in the rectosigmoid colon (distal edge about 7cm from the anus). The mass was partially circumferential (involving one-half of the lumen circumference). The mass measured eight cm in length. This was biopsied with a cold forceps for histology. The distal edge of the mass was tattooed with two submucosal injections of Spot (carbon black). Sigmoid colon felt somewhat fixed in place (large CRC or perhaps invasion into colon from other primary site?) - Internal hemorrhoids. - The examination was otherwise normal on direct and retroflexion views.   02/04/2021 Pathology Results   Diagnosis 1. 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 - NO HIGH-GRADE DYSPLASIA OR CARCINOMA. 2. Colon, biopsy, rectosigmoid - INVASIVE ADENOCARCINOMA. - SEE NOTE.   02/04/2021 Cancer Staging   Staging form: Colon and Rectum, AJCC 8th Edition - Clinical stage from 02/04/2021: Stage IVB (cTX, cN2, cM1b) - Signed by Truitt Merle, MD on 02/12/2021 Stage prefix: Initial diagnosis   02/08/2021 Imaging   CT CHEST, ABDOMEN AND PELVIS W CONTRAST  IMPRESSION: 1. There is a large, concentric mass of the rectosigmoid junction measuring 7.8 x 5.9 x 5.2 cm. Findings are consistent with primary colon malignancy identified by colonoscopy. 2. Multiple enlarged perirectal, bilateral pelvic sidewall, and retroperitoneal lymph nodes, consistent with nodal metastatic disease.  3. There are multiple small low-attenuation lesions of the liver parenchyma, incompletely characterized although suspicious for hepatic metastatic disease. PET-CT or multiphasic contrast  enhanced MRI could be used to more clearly characterize. 4. There are multiple small bilateral pulmonary nodules, measuring 3 mm and smaller, nonspecific although highly suspicious for early pulmonary metastatic disease given overall constellation of findings.   02/12/2021 Initial Diagnosis   Malignant neoplasm of rectosigmoid junction ([place])   03/04/2021 PET scan   IMPRESSION: 1. Intense hypermetabolic activity associated with rectal mass consistent with primary colorectal carcinoma. 2. Local extension of primary tumor into the LEFT perirectal fat. 3. Hypermetabolic LEFT operator node consistent with local nodal metastasis. 4. Hypermetabolic metastasis to the LEFT and RIGHT hepatic lobe (two lesions). 5. Hypermetabolic periportal lymph node. 6. Small pulmonary nodules without radiotracer activity. Recommend attention on follow-up.   03/13/2021 -  Chemotherapy    Patient is on Treatment Plan: COLORECTAL XELOX (CAPEOX) Q21D        Genetic Testing   Negative genetic testing. No pathogenic variants identified on the [facility] Multi-Cancer+RNA Panel. 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 The report date is 04/01/2021.  The Multi-Cancer Panel + RNA offered by [facility] includes sequencing and/or deletion duplication testing of the following 84 genes: AIP, ALK, APC, ATM, AXIN2,BAP1,  BARD1, BLM, BMPR1A, BRCA1, BRCA2, BRIP1, CASR, CDC73, CDH1, CDK4, CDKN1B, CDKN1C, CDKN2A (p14ARF), CDKN2A (p16INK4a), CEBPA, CHEK2, CTNNA1, DICER1, DIS3L2, EGFR (c.2369C>T, p.Thr790Met variant only), EPCAM (Deletion/duplication testing only), FH, FLCN, GATA2, GPC3, GREM1 (Promoter region deletion/duplication testing only), HOXB13 (c.251G>A, p.Gly84Glu), HRAS, KIT, MAX, MEN1, MET, MITF (c.952G>A, p.Glu318Lys variant only), MLH1, MSH2, MSH3, MSH6, MUTYH, NBN, NF1, NF2, NTHL1, PALB2, PDGFRA, PHOX2B, PMS2, POLD1, POLE, POT1, PRKAR1A, PTCH1, PTEN, RAD50, RAD51C, RAD51D, RB1, RECQL4, RET, RUNX1, SDHAF2, SDHA (sequence  changes only), SDHB, SDHC, SDHD, SMAD4, SMARCA4, SMARCB1, SMARCE1, STK11, SUFU, TERC, TERT, TMEM127, TP53, TSC1, TSC2, VHL, WRN and WT1.     CURRENT THERAPY: CAPOX and beva started on 03/13/2021  INTERVAL HISTORY: Pt is here for f/u and third dose chemo. She is here with her husband She had some diarrhea after chemo, resolved  Cold sensitivity was worse, and had burning sensation on left arm due to chemo infusion, she still does not want a port.  Her rectal discomfort has much improved since she started chemo, especially after second cycle  She is on low fiber diet, but wishes to have some red meat and veggie  Weight stable   All other systems were reviewed with the patient and are negative.  MEDICAL HISTORY:  Past Medical History:  Diagnosis Date   Colorectal cancer ([place])    Family history of brain cancer    Family history of prostate cancer    Family history of stomach cancer    Heart murmur    dx at 26   MVP is stable   Hemorrhoids     SURGICAL HISTORY: Past Surgical History:  Procedure Laterality Date   NO PAST SURGERIES      I have reviewed the social history and family history with the patient and they are unchanged from previous note.  ALLERGIES:  is allergic to codeine, epinephrine (anaphylaxis), orange fruit [citrus], streptogramins, and tomato.  MEDICATIONS:  Current Outpatient Medications  Medication Sig Dispense Refill   acetaminophen (TYLENOL) 325 MG tablet Take 650 mg by mouth every 6 (six) hours as needed.     capecitabine (XELODA) 500 MG tablet Take 2 tabs in morning and 3 tabs in evening every 12 hours. Take on  days 1-14 of chemotherapy. Repeat every 21 days. 70 tablet 1   cholecalciferol (VITAMIN D3) 25 MCG (1000 UNIT) tablet Take 1,000 Units by mouth daily.     ondansetron (ZOFRAN) 8 MG tablet Take 1 tablet (8 mg total) by mouth 2 (two) times daily as needed for refractory nausea / vomiting. Start on day 3 after chemotherapy. 30 tablet 1    prochlorperazine (COMPAZINE) 10 MG tablet Take 1 tablet (10 mg total) by mouth every 6 (six) hours as needed (Nausea or vomiting). 30 tablet 1   traMADol (ULTRAM) 50 MG tablet Take 0.5-1 tablets (25-50 mg total) by mouth every 8 (eight) hours as needed. 30 tablet 0   VALERIAN ROOT PO Take by mouth.     vitamin C (ASCORBIC ACID) 500 MG tablet Take 500 mg by mouth daily.     No current facility-administered medications for this visit.    PHYSICAL EXAMINATION: ECOG PERFORMANCE STATUS: 1 - Symptomatic but completely ambulatory  Vitals:   04/24/21 0928  BP: 115/80  Pulse: 88  Resp: 18  Temp: 98 F (36.7 C)  SpO2: 98%     Filed Weights   04/24/21 0928  Weight: 112 lb 9.6 oz (51.1 kg)      GENERAL:alert, no distress and comfortable SKIN: skin color, texture, turgor are normal, no rashes or significant lesions EYES: normal, Conjunctiva are pink and non-injected, sclera clear Musculoskeletal:no cyanosis of digits and no clubbing  NEURO: alert & oriented x 3 with fluent speech, no focal motor/sensory deficits  LABORATORY DATA:  I have reviewed the data as listed CBC Latest Ref Rng & Units 04/24/2021 04/03/2021 03/13/2021  WBC 4.0 - 10.5 K/uL 4.4 6.0 7.9  Hemoglobin 12.0 - 15.0 g/dL 13.5 14.5 14.1  Hematocrit 36.0 - 46.0 % 38.5 41.4 40.6  Platelets 150 - 400 K/uL 125(L) 132(L) 158     CMP Latest Ref Rng & Units 04/24/2021 04/03/2021 03/13/2021  Glucose 70 - 99 mg/dL 108(H) 123(H) 90  BUN 6 - 20 mg/dL _0 Creatinine 0.44 - 1.00 mg/dL 0.69 0.71 0.64  Sodium 135 - 145 mmol/L 137 139 136  Potassium 3.5 - 5.1 mmol/L 4.0 3.9 4.3  Chloride 98 - 111 mmol/L 105 105 103  CO2 22 - 32 mmol/L _1 Calcium 8.9 - 10.3 mg/dL 9.7 9.6 9.9  Total Protein 6.5 - 8.1 g/dL 6.9 7.1 7.2  Total Bilirubin 0.3 - 1.2 mg/dL 0.9 0.7 0.7  Alkaline Phos 38 - 126 U/L 100 104 101  AST 15 - 41 U/L 39 43(H) 37  ALT 0 - 44 U/L 53(H) 67(H) 67(H)      RADIOGRAPHIC STUDIES: I have personally reviewed  the radiological images as listed and agreed with the findings in the report. No results found.     No orders of the defined types were placed in this encounter.  All questions were answered. The patient knows to call the clinic with any problems, questions or concerns. No barriers to learning was detected. I spent 25 minutes counseling the patient face to face. The total time spent in the appointment was 30 minutes and more than 50% was on counseling and review of test results     Truitt Merle, MD 04/24/21

## 2021-04-27 ENCOUNTER — Encounter: Payer: Self-pay | Admitting: Hematology

## 2021-05-13 ENCOUNTER — Telehealth: Payer: Self-pay

## 2021-05-13 NOTE — Telephone Encounter (Signed)
This nurse returned call to patient and verified that she was requesting to hold her chemo for a week.  Patient states that is her request.  Nurse informed patient per Dr. Burr Medico that she can hold her treatment for a week.  She can also hold her Xeloda until she sees the provider in the office.  Patient states that yesterday she had peanut butter crackers and she was not able to keep them down.  Today she is trying chicken noodle soup and so far she has not vomited but still has some nausea.  Patient acknowledged understanding.  No further questions or concerns at this time.

## 2021-05-13 NOTE — Telephone Encounter (Signed)
This nurse received a message from this patient stating that she has been having diarrhea since Thursday.  States that every time she eats she vomits and this has been going on since she started taking Xeloda.  Patient is requesting to know if she can take a break for one week. No further questions or concerns at this time.    Information forwarded to MD.

## 2021-05-14 MED FILL — Dexamethasone Sodium Phosphate Inj 100 MG/10ML: INTRAMUSCULAR | Qty: 1 | Status: AC

## 2021-05-14 NOTE — Progress Notes (Deleted)
[place]   Telephone:(336) [phone] Fax:(336) [phone]   Clinic Follow up Note   Patient Care Team: Pcp, No as PCP - General Truitt Merle, MD as Consulting Physician (Oncology) Milus Banister, MD as Attending Physician (Gastroenterology) Kyung Rudd, MD as Consulting Physician (Radiation Oncology) 05/14/2021  CHIEF COMPLAINT: Follow up metastatic colon cancer   SUMMARY OF ONCOLOGIC HISTORY: Oncology History Overview Note  Cancer Staging Malignant neoplasm of rectosigmoid junction [facility]) Staging form: Colon and Rectum, AJCC 8th Edition - Clinical stage from 02/04/2021: Stage IVB (cTX, cN2, cM1b) - Signed by Truitt Merle, MD on 02/12/2021 Stage prefix: Initial diagnosis    Malignant neoplasm of rectosigmoid junction ([place])  02/04/2021 Procedure   Colonoscopy by Dr Ardis Hughs 02/04/21 IMPRESSION - Two 2 to 6 mm polyps in the sigmoid colon and in the cecum, removed with a cold snare. Resected and retrieved. - A fungating, infiltrative and ulcerated partially obstructing mass was found in the rectosigmoid colon (distal edge about 7cm from the anus). The mass was partially circumferential (involving one-half of the lumen circumference). The mass measured eight cm in length. This was biopsied with a cold forceps for histology. The distal edge of the mass was tattooed with two submucosal injections of Spot (carbon black). Sigmoid colon felt somewhat fixed in place (large CRC or perhaps invasion into colon from other primary site?) - Internal hemorrhoids. - The examination was otherwise normal on direct and retroflexion views.   02/04/2021 Pathology Results   Diagnosis 1. 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 - NO HIGH-GRADE DYSPLASIA OR CARCINOMA. 2. Colon, biopsy, rectosigmoid - INVASIVE ADENOCARCINOMA. - SEE NOTE.   02/04/2021 Cancer Staging   Staging form: Colon and Rectum, AJCC 8th Edition - Clinical stage from 02/04/2021: Stage IVB (cTX, cN2,  cM1b) - Signed by Truitt Merle, MD on 02/12/2021 Stage prefix: Initial diagnosis   02/08/2021 Imaging   CT CHEST, ABDOMEN AND PELVIS W CONTRAST  IMPRESSION: 1. There is a large, concentric mass of the rectosigmoid junction measuring 7.8 x 5.9 x 5.2 cm. Findings are consistent with primary colon malignancy identified by colonoscopy. 2. Multiple enlarged perirectal, bilateral pelvic sidewall, and retroperitoneal lymph nodes, consistent with nodal metastatic disease. 3. There are multiple small low-attenuation lesions of the liver parenchyma, incompletely characterized although suspicious for hepatic metastatic disease. PET-CT or multiphasic contrast enhanced MRI could be used to more clearly characterize. 4. There are multiple small bilateral pulmonary nodules, measuring 3 mm and smaller, nonspecific although highly suspicious for early pulmonary metastatic disease given overall constellation of findings.   02/12/2021 Initial Diagnosis   Malignant neoplasm of rectosigmoid junction ([place])   03/04/2021 PET scan   IMPRESSION: 1. Intense hypermetabolic activity associated with rectal mass consistent with primary colorectal carcinoma. 2. Local extension of primary tumor into the LEFT perirectal fat. 3. Hypermetabolic LEFT operator node consistent with local nodal metastasis. 4. Hypermetabolic metastasis to the LEFT and RIGHT hepatic lobe (two lesions). 5. Hypermetabolic periportal lymph node. 6. Small pulmonary nodules without radiotracer activity. Recommend attention on follow-up.   03/13/2021 -  Chemotherapy    Patient is on Treatment Plan: COLORECTAL XELOX (CAPEOX) Q21D        Genetic Testing   Negative genetic testing. No pathogenic variants identified on the [facility] Multi-Cancer+RNA Panel. 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 The report date is 04/01/2021.  The Multi-Cancer Panel + RNA offered by [facility] includes sequencing and/or deletion duplication testing of the following 84  genes: AIP, ALK, APC, ATM,  AXIN2,BAP1,  BARD1, BLM, BMPR1A, BRCA1, BRCA2, BRIP1, CASR, CDC73, CDH1, CDK4, CDKN1B, CDKN1C, CDKN2A (p14ARF), CDKN2A (p16INK4a), CEBPA, CHEK2, CTNNA1, DICER1, DIS3L2, EGFR (c.2369C>T, p.Thr790Met variant only), EPCAM (Deletion/duplication testing only), FH, FLCN, GATA2, GPC3, GREM1 (Promoter region deletion/duplication testing only), HOXB13 (c.251G>A, p.Gly84Glu), HRAS, KIT, MAX, MEN1, MET, MITF (c.952G>A, p.Glu318Lys variant only), MLH1, MSH2, MSH3, MSH6, MUTYH, NBN, NF1, NF2, NTHL1, PALB2, PDGFRA, PHOX2B, PMS2, POLD1, POLE, POT1, PRKAR1A, PTCH1, PTEN, RAD50, RAD51C, RAD51D, RB1, RECQL4, RET, RUNX1, SDHAF2, SDHA (sequence changes only), SDHB, SDHC, SDHD, SMAD4, SMARCA4, SMARCB1, SMARCE1, STK11, SUFU, TERC, TERT, TMEM127, TP53, TSC1, TSC2, VHL, WRN and WT1.     CURRENT THERAPY: First line CAPOX and bevacizumab, starting 03/13/21  INTERVAL HISTORY: Angela Mcpherson returns for follow up as scheduled. She was seen and began C3 chemo 04/24/21. She called 9/26 requesting to postpone this chemo due to n/v/d.    REVIEW OF SYSTEMS:   Constitutional: Denies fevers, chills or abnormal weight loss Eyes: Denies blurriness of vision Ears, nose, mouth, throat, and face: Denies mucositis or sore throat Respiratory: Denies cough, dyspnea or wheezes Cardiovascular: Denies palpitation, chest discomfort or lower extremity swelling Gastrointestinal:  Denies nausea, heartburn or change in bowel habits Skin: Denies abnormal skin rashes Lymphatics: Denies new lymphadenopathy or easy bruising Neurological:Denies numbness, tingling or new weaknesses Behavioral/Psych: Mood is stable, no new changes  All other systems were reviewed with the patient and are negative.  MEDICAL HISTORY:  Past Medical History:  Diagnosis Date   Colorectal cancer ([place])    Family history of brain cancer    Family history of prostate cancer    Family history of stomach cancer    Heart murmur    dx at 26   MVP is  stable   Hemorrhoids     SURGICAL HISTORY: Past Surgical History:  Procedure Laterality Date   NO PAST SURGERIES      I have reviewed the social history and family history with the patient and they are unchanged from previous note.  ALLERGIES:  is allergic to codeine, epinephrine (anaphylaxis), orange fruit [citrus], streptogramins, and tomato.  MEDICATIONS:  Current Outpatient Medications  Medication Sig Dispense Refill   acetaminophen (TYLENOL) 325 MG tablet Take 650 mg by mouth every 6 (six) hours as needed.     capecitabine (XELODA) 500 MG tablet Take 2 tabs in morning and 3 tabs in evening every 12 hours. Take on days 1-14 of chemotherapy. Repeat every 21 days. 70 tablet 1   cholecalciferol (VITAMIN D3) 25 MCG (1000 UNIT) tablet Take 1,000 Units by mouth daily.     ondansetron (ZOFRAN) 8 MG tablet Take 1 tablet (8 mg total) by mouth 2 (two) times daily as needed for refractory nausea / vomiting. Start on day 3 after chemotherapy. 30 tablet 1   prochlorperazine (COMPAZINE) 10 MG tablet Take 1 tablet (10 mg total) by mouth every 6 (six) hours as needed (Nausea or vomiting). 30 tablet 1   traMADol (ULTRAM) 50 MG tablet Take 0.5-1 tablets (25-50 mg total) by mouth every 8 (eight) hours as needed. 30 tablet 0   VALERIAN ROOT PO Take by mouth.     vitamin C (ASCORBIC ACID) 500 MG tablet Take 500 mg by mouth daily.     No current facility-administered medications for this visit.    PHYSICAL EXAMINATION: ECOG PERFORMANCE STATUS: {CHL ONC ECOG [phone]}  There were no vitals filed for this visit. There were no vitals filed for this visit.  GENERAL:alert, no distress and comfortable SKIN: skin color,  texture, turgor are normal, no rashes or significant lesions EYES: normal, Conjunctiva are pink and non-injected, sclera clear OROPHARYNX:no exudate, no erythema and lips, buccal mucosa, and tongue normal  NECK: supple, thyroid normal size, non-tender, without nodularity LYMPH:   no palpable lymphadenopathy in the cervical, axillary or inguinal LUNGS: clear to auscultation and percussion with normal breathing effort HEART: regular rate & rhythm and no murmurs and no lower extremity edema ABDOMEN:abdomen soft, non-tender and normal bowel sounds Musculoskeletal:no cyanosis of digits and no clubbing  NEURO: alert & oriented x 3 with fluent speech, no focal motor/sensory deficits  LABORATORY DATA:  I have reviewed the data as listed CBC Latest Ref Rng & Units 04/24/2021 04/03/2021 03/13/2021  WBC 4.0 - 10.5 K/uL 4.4 6.0 7.9  Hemoglobin 12.0 - 15.0 g/dL 13.5 14.5 14.1  Hematocrit 36.0 - 46.0 % 38.5 41.4 40.6  Platelets 150 - 400 K/uL 125(L) 132(L) 158     CMP Latest Ref Rng & Units 04/24/2021 04/03/2021 03/13/2021  Glucose 70 - 99 mg/dL 108(H) 123(H) 90  BUN 6 - 20 mg/dL 10 10 9   Creatinine 0.44 - 1.00 mg/dL 0.69 0.71 0.64  Sodium 135 - 145 mmol/L 137 139 136  Potassium 3.5 - 5.1 mmol/L 4.0 3.9 4.3  Chloride 98 - 111 mmol/L 105 105 103  CO2 22 - 32 mmol/L 22 23 25   Calcium 8.9 - 10.3 mg/dL 9.7 9.6 9.9  Total Protein 6.5 - 8.1 g/dL 6.9 7.1 7.2  Total Bilirubin 0.3 - 1.2 mg/dL 0.9 0.7 0.7  Alkaline Phos 38 - 126 U/L 100 104 101  AST 15 - 41 U/L 39 43(H) 37  ALT 0 - 44 U/L 53(H) 67(H) 67(H)      RADIOGRAPHIC STUDIES: I have personally reviewed the radiological images as listed and agreed with the findings in the report. No results found.   ASSESSMENT & PLAN:  No problem-specific Assessment & Plan notes found for this encounter.   No orders of the defined types were placed in this encounter.  All questions were answered. The patient knows to call the clinic with any problems, questions or concerns. No barriers to learning was detected. I spent {CHL ONC TIME VISIT - XENMM:[id number]} counseling the patient face to face. The total time spent in the appointment was {CHL ONC TIME VISIT - PRXYV:[id number]} and more than 50% was on counseling and review of test  results     Alla Feeling, NP 05/14/21

## 2021-05-15 ENCOUNTER — Inpatient Hospital Stay: Payer: 59 | Admitting: Nurse Practitioner

## 2021-05-15 ENCOUNTER — Inpatient Hospital Stay: Payer: 59

## 2021-05-18 ENCOUNTER — Encounter: Payer: Self-pay | Admitting: Hematology

## 2021-05-18 ENCOUNTER — Other Ambulatory Visit: Payer: Self-pay

## 2021-05-18 ENCOUNTER — Emergency Department (HOSPITAL_COMMUNITY): Payer: 59

## 2021-05-18 ENCOUNTER — Encounter (HOSPITAL_COMMUNITY): Payer: Self-pay | Admitting: *Deleted

## 2021-05-18 ENCOUNTER — Inpatient Hospital Stay (HOSPITAL_COMMUNITY)
Admission: EM | Admit: 2021-05-18 | Discharge: 2021-05-27 | DRG: 391 | Disposition: A | Discharge status: Home Health | Payer: 59 | Attending: Internal Medicine | Admitting: Internal Medicine

## 2021-05-18 DIAGNOSIS — R112 Nausea with vomiting, unspecified: Secondary | ICD-10-CM | POA: Diagnosis present

## 2021-05-18 DIAGNOSIS — Z808 Family history of malignant neoplasm of other organs or systems: Secondary | ICD-10-CM

## 2021-05-18 DIAGNOSIS — I7409 Other arterial embolism and thrombosis of abdominal aorta: Secondary | ICD-10-CM | POA: Diagnosis present

## 2021-05-18 DIAGNOSIS — D696 Thrombocytopenia, unspecified: Secondary | ICD-10-CM | POA: Diagnosis present

## 2021-05-18 DIAGNOSIS — R600 Localized edema: Secondary | ICD-10-CM | POA: Diagnosis not present

## 2021-05-18 DIAGNOSIS — I7410 Embolism and thrombosis of unspecified parts of aorta: Secondary | ICD-10-CM

## 2021-05-18 DIAGNOSIS — E860 Dehydration: Secondary | ICD-10-CM | POA: Diagnosis present

## 2021-05-18 DIAGNOSIS — I513 Intracardiac thrombosis, not elsewhere classified: Secondary | ICD-10-CM | POA: Diagnosis present

## 2021-05-18 DIAGNOSIS — R4182 Altered mental status, unspecified: Secondary | ICD-10-CM

## 2021-05-18 DIAGNOSIS — E871 Hypo-osmolality and hyponatremia: Secondary | ICD-10-CM | POA: Diagnosis present

## 2021-05-18 DIAGNOSIS — D6959 Other secondary thrombocytopenia: Secondary | ICD-10-CM | POA: Diagnosis present

## 2021-05-18 DIAGNOSIS — Z20822 Contact with and (suspected) exposure to covid-19: Secondary | ICD-10-CM | POA: Diagnosis present

## 2021-05-18 DIAGNOSIS — F17210 Nicotine dependence, cigarettes, uncomplicated: Secondary | ICD-10-CM | POA: Diagnosis present

## 2021-05-18 DIAGNOSIS — C799 Secondary malignant neoplasm of unspecified site: Secondary | ICD-10-CM | POA: Diagnosis present

## 2021-05-18 DIAGNOSIS — Z9109 Other allergy status, other than to drugs and biological substances: Secondary | ICD-10-CM

## 2021-05-18 DIAGNOSIS — R0602 Shortness of breath: Secondary | ICD-10-CM | POA: Diagnosis not present

## 2021-05-18 DIAGNOSIS — G9340 Encephalopathy, unspecified: Secondary | ICD-10-CM | POA: Diagnosis present

## 2021-05-18 DIAGNOSIS — K529 Noninfective gastroenteritis and colitis, unspecified: Secondary | ICD-10-CM | POA: Diagnosis present

## 2021-05-18 DIAGNOSIS — E872 Acidosis, unspecified: Secondary | ICD-10-CM | POA: Diagnosis present

## 2021-05-18 DIAGNOSIS — R000 Tachycardia, unspecified: Secondary | ICD-10-CM | POA: Diagnosis present

## 2021-05-18 DIAGNOSIS — K633 Ulcer of intestine: Secondary | ICD-10-CM | POA: Diagnosis present

## 2021-05-18 DIAGNOSIS — Z888 Allergy status to other drugs, medicaments and biological substances status: Secondary | ICD-10-CM

## 2021-05-18 DIAGNOSIS — E876 Hypokalemia: Secondary | ICD-10-CM | POA: Diagnosis present

## 2021-05-18 DIAGNOSIS — R188 Other ascites: Secondary | ICD-10-CM | POA: Diagnosis present

## 2021-05-18 DIAGNOSIS — A0811 Acute gastroenteropathy due to Norwalk agent: Principal | ICD-10-CM | POA: Diagnosis present

## 2021-05-18 DIAGNOSIS — A419 Sepsis, unspecified organism: Secondary | ICD-10-CM | POA: Diagnosis not present

## 2021-05-18 DIAGNOSIS — C19 Malignant neoplasm of rectosigmoid junction: Secondary | ICD-10-CM | POA: Diagnosis present

## 2021-05-18 DIAGNOSIS — Z885 Allergy status to narcotic agent status: Secondary | ICD-10-CM

## 2021-05-18 DIAGNOSIS — E8770 Fluid overload, unspecified: Secondary | ICD-10-CM | POA: Diagnosis not present

## 2021-05-18 DIAGNOSIS — Z681 Body mass index (BMI) 19 or less, adult: Secondary | ICD-10-CM

## 2021-05-18 DIAGNOSIS — Z800 Family history of malignant neoplasm of digestive organs: Secondary | ICD-10-CM

## 2021-05-18 DIAGNOSIS — Z79899 Other long term (current) drug therapy: Secondary | ICD-10-CM

## 2021-05-18 DIAGNOSIS — E43 Unspecified severe protein-calorie malnutrition: Secondary | ICD-10-CM | POA: Diagnosis present

## 2021-05-18 DIAGNOSIS — Z8042 Family history of malignant neoplasm of prostate: Secondary | ICD-10-CM

## 2021-05-18 DIAGNOSIS — T451X5A Adverse effect of antineoplastic and immunosuppressive drugs, initial encounter: Secondary | ICD-10-CM | POA: Diagnosis present

## 2021-05-18 DIAGNOSIS — R0600 Dyspnea, unspecified: Secondary | ICD-10-CM

## 2021-05-18 DIAGNOSIS — G9341 Metabolic encephalopathy: Secondary | ICD-10-CM | POA: Diagnosis present

## 2021-05-18 LAB — COMPREHENSIVE METABOLIC PANEL
ALT: 31 U/L (ref 0–44)
AST: 35 U/L (ref 15–41)
Albumin: 3 g/dL — ABNORMAL LOW (ref 3.5–5.0)
Alkaline Phosphatase: 102 U/L (ref 38–126)
Anion gap: 8 (ref 5–15)
BUN: 49 mg/dL — ABNORMAL HIGH (ref 6–20)
CO2: 28 mmol/L (ref 22–32)
Calcium: 9.8 mg/dL (ref 8.9–10.3)
Chloride: 93 mmol/L — ABNORMAL LOW (ref 98–111)
Creatinine, Ser: 0.81 mg/dL (ref 0.44–1.00)
GFR, Estimated: >60 mL/min (ref >60)
Glucose, Bld: 132 mg/dL — ABNORMAL HIGH (ref 70–99)
Potassium: 5 mmol/L (ref 3.5–5.1)
Sodium: 129 mmol/L — ABNORMAL LOW (ref 135–145)
Total Bilirubin: 1.4 mg/dL — ABNORMAL HIGH (ref 0.3–1.2)
Total Protein: 6 g/dL — ABNORMAL LOW (ref 6.5–8.1)

## 2021-05-18 LAB — CBC WITH DIFFERENTIAL/PLATELET
Abs Immature Granulocytes: 0.32 10*3/uL — ABNORMAL HIGH (ref 0.00–0.07)
Basophils Absolute: 0 10*3/uL (ref 0.0–0.1)
Basophils Relative: 0 %
Eosinophils Absolute: 0 10*3/uL (ref 0.0–0.5)
Eosinophils Relative: 0 %
HCT: 46.9 % — ABNORMAL HIGH (ref 36.0–46.0)
Hemoglobin: 17.3 g/dL — ABNORMAL HIGH (ref 12.0–15.0)
Immature Granulocytes: 1 %
Lymphocytes Relative: 25 %
Lymphs Abs: 5.7 10*3/uL — ABNORMAL HIGH (ref 0.7–4.0)
MCH: 33.8 pg (ref 26.0–34.0)
MCHC: 36.9 g/dL — ABNORMAL HIGH (ref 30.0–36.0)
MCV: 91.6 fL (ref 80.0–100.0)
Monocytes Absolute: 2.6 10*3/uL — ABNORMAL HIGH (ref 0.1–1.0)
Monocytes Relative: 11 %
Neutro Abs: 14.1 10*3/uL — ABNORMAL HIGH (ref 1.7–7.7)
Neutrophils Relative %: 63 %
Platelets: 92 10*3/uL — ABNORMAL LOW (ref 150–400)
RBC: 5.12 MIL/uL — ABNORMAL HIGH (ref 3.87–5.11)
RDW: 19 % — ABNORMAL HIGH (ref 11.5–15.5)
WBC: 22.7 10*3/uL — ABNORMAL HIGH (ref 4.0–10.5)
nRBC: 0 % (ref 0.0–0.2)

## 2021-05-18 LAB — AMMONIA: Ammonia: 38 umol/L — ABNORMAL HIGH (ref 9–35)

## 2021-05-18 LAB — URINALYSIS, COMPLETE (UACMP) WITH MICROSCOPIC
Bacteria, UA: NONE SEEN
Bilirubin Urine: NEGATIVE
Glucose, UA: NEGATIVE mg/dL
Hgb urine dipstick: NEGATIVE
Ketones, ur: NEGATIVE mg/dL
Leukocytes,Ua: NEGATIVE
Nitrite: NEGATIVE
Protein, ur: NEGATIVE mg/dL
Specific Gravity, Urine: >1.046 — ABNORMAL HIGH (ref 1.005–1.030)
pH: 6 (ref 5.0–8.0)

## 2021-05-18 LAB — APTT: aPTT: 24 seconds (ref 24–36)

## 2021-05-18 LAB — BLOOD GAS, VENOUS
Acid-Base Excess: 3.8 mmol/L — ABNORMAL HIGH (ref 0.0–2.0)
Bicarbonate: 28.8 mmol/L — ABNORMAL HIGH (ref 20.0–28.0)
O2 Saturation: 37 %
Patient temperature: 98.6
pCO2, Ven: 46.2 mmHg (ref 44.0–60.0)
pH, Ven: 7.412 (ref 7.250–7.430)
pO2, Ven: 25.1 mmHg — CL (ref 32.0–45.0)

## 2021-05-18 LAB — RAPID URINE DRUG SCREEN, HOSP PERFORMED
Amphetamines: NOT DETECTED
Barbiturates: NOT DETECTED
Benzodiazepines: NOT DETECTED
Cocaine: NOT DETECTED
Opiates: NOT DETECTED
Tetrahydrocannabinol: NOT DETECTED

## 2021-05-18 LAB — LACTIC ACID, PLASMA
Lactic Acid, Venous: 2.1 mmol/L (ref 0.5–1.9)
Lactic Acid, Venous: 2.8 mmol/L (ref 0.5–1.9)

## 2021-05-18 LAB — MAGNESIUM: Magnesium: 2.3 mg/dL (ref 1.7–2.4)

## 2021-05-18 LAB — TSH: TSH: 6.354 u[IU]/mL — ABNORMAL HIGH (ref 0.350–4.500)

## 2021-05-18 LAB — RESP PANEL BY RT-PCR (FLU A&B, COVID) ARPGX2
Influenza A by PCR: NEGATIVE
Influenza B by PCR: NEGATIVE
SARS Coronavirus 2 by RT PCR: NEGATIVE

## 2021-05-18 LAB — PROTIME-INR
INR: 1.3 — ABNORMAL HIGH (ref 0.8–1.2)
Prothrombin Time: 16.6 seconds — ABNORMAL HIGH (ref 11.4–15.2)

## 2021-05-18 LAB — TROPONIN I (HIGH SENSITIVITY): Troponin I (High Sensitivity): 7 ng/L (ref <18)

## 2021-05-18 LAB — PROCALCITONIN: Procalcitonin: 0.77 ng/mL

## 2021-05-18 LAB — D-DIMER, QUANTITATIVE: D-Dimer, Quant: 2.2 ug/mL-FEU — ABNORMAL HIGH (ref 0.00–0.50)

## 2021-05-18 MED ORDER — LACTATED RINGERS IV BOLUS (SEPSIS)
1000.0000 mL | Freq: Once | INTRAVENOUS | Status: AC
  Administered 2021-05-18: 1000 mL via INTRAVENOUS

## 2021-05-18 MED ORDER — HEPARIN SODIUM (PORCINE) 5000 UNIT/ML IJ SOLN: 60.0000 [IU]/kg | Freq: Once | INTRAMUSCULAR | Status: DC

## 2021-05-18 MED ORDER — ONDANSETRON HCL 4 MG/2ML IJ SOLN: 4.0000 mg | Freq: Four times a day (QID) | INTRAMUSCULAR | Status: DC | PRN

## 2021-05-18 MED ORDER — HEPARIN BOLUS VIA INFUSION
2500.0000 [IU] | Freq: Once | INTRAVENOUS | Status: AC
  Administered 2021-05-18: 2500 [IU] via INTRAVENOUS
  Filled 2021-05-18: qty 2500

## 2021-05-18 MED ORDER — METRONIDAZOLE 500 MG/100ML IV SOLN
500.0000 mg | Freq: Once | INTRAVENOUS | Status: AC
  Administered 2021-05-18: 500 mg via INTRAVENOUS
  Filled 2021-05-18: qty 100

## 2021-05-18 MED ORDER — SODIUM CHLORIDE 0.9 % IV SOLN
2.0000 g | Freq: Two times a day (BID) | INTRAVENOUS | Status: DC
  Administered 2021-05-19 – 2021-05-20 (×3): 2 g via INTRAVENOUS
  Filled 2021-05-18 (×4): qty 2

## 2021-05-18 MED ORDER — LACTATED RINGERS IV BOLUS
1000.0000 mL | Freq: Once | INTRAVENOUS | Status: AC
  Administered 2021-05-18: 1000 mL via INTRAVENOUS

## 2021-05-18 MED ORDER — LACTATED RINGERS IV SOLN: INTRAVENOUS | Status: DC

## 2021-05-18 MED ORDER — IOHEXOL 350 MG/ML SOLN
80.0000 mL | Freq: Once | INTRAVENOUS | Status: AC | PRN
  Administered 2021-05-18: 80 mL via INTRAVENOUS

## 2021-05-18 MED ORDER — HYDROMORPHONE HCL 1 MG/ML IJ SOLN
0.5000 mg | INTRAMUSCULAR | Status: DC | PRN
  Administered 2021-05-19 – 2021-05-21 (×5): 1 mg via INTRAVENOUS
  Filled 2021-05-18 (×6): qty 1

## 2021-05-18 MED ORDER — ACETAMINOPHEN 650 MG RE SUPP: 650.0000 mg | Freq: Four times a day (QID) | RECTAL | Status: DC | PRN

## 2021-05-18 MED ORDER — HEPARIN (PORCINE) 25000 UT/250ML-% IV SOLN
800.0000 [IU]/h | INTRAVENOUS | Status: DC
  Administered 2021-05-18 – 2021-05-21 (×3): 800 [IU]/h via INTRAVENOUS
  Filled 2021-05-18 (×3): qty 250

## 2021-05-18 MED ORDER — METRONIDAZOLE 500 MG/100ML IV SOLN
500.0000 mg | Freq: Two times a day (BID) | INTRAVENOUS | Status: DC
Start: 2021-05-19 — End: 2021-05-22
  Administered 2021-05-19 – 2021-05-22 (×7): 500 mg via INTRAVENOUS
  Filled 2021-05-18 (×7): qty 100

## 2021-05-18 MED ORDER — SODIUM CHLORIDE 0.9 % IV SOLN
1.0000 g | Freq: Once | INTRAVENOUS | Status: AC
  Administered 2021-05-18: 1 g via INTRAVENOUS
  Filled 2021-05-18: qty 10

## 2021-05-18 MED ORDER — HEPARIN BOLUS VIA INFUSION: 3000.0000 [IU] | Freq: Once | INTRAVENOUS | Status: DC

## 2021-05-18 MED ORDER — ACETAMINOPHEN 325 MG PO TABS
650.0000 mg | ORAL_TABLET | Freq: Four times a day (QID) | ORAL | Status: DC | PRN
  Administered 2021-05-24: 650 mg via ORAL
  Filled 2021-05-18: qty 2

## 2021-05-18 MED ORDER — ONDANSETRON HCL 4 MG PO TABS: 4.0000 mg | ORAL_TABLET | Freq: Four times a day (QID) | ORAL | Status: DC | PRN

## 2021-05-18 NOTE — H&P (Signed)
History and Physical    Angela Mcpherson WUX:[id number] DOB: 08/11/1961 DOA: 05/18/2021  PCP: Truitt Merle, MD  Patient coming from: Home  I have personally briefly reviewed patient's old medical records in [place]  Chief Complaint: AMS  HPI: Angela Mcpherson is a 60 y.o. female with medical history significant of stage 4 rectal cancer.  Pt presents to ED for AMS.  Pt with generalized weakness, watery diarrhea for past week.  AMS over past couple of days.  She was last normal after her last chemotherapy treatment on Tuesday, 05/14/2021.  She is reportedly now nonverbal and has been significantly lethargic per family members at home and husband at bedside in ED.  Per husband, she has not been at her baseline since this past Tuesday. Progressively worsening. Hasn't been getting out of the couch. Probably spent all night covered in her own stool. Worsening mental status. Not eating or drinking. Any attempts at eating, she has been vomiting. No emesis the past two days.  Pt unable to contribute to history taking due to AMS.   ED Course: Pt with leukocytosis, labs also look significantly dehydrated.  CT scan reveals mural thrombus of decending aorta, severe stenosis but non occlusive.  Pt does have B DP pulses.   Review of Systems: Unable to perform due to AMS  Past Medical History:  Diagnosis Date   Colorectal cancer ([place])    Family history of brain cancer    Family history of prostate cancer    Family history of stomach cancer    Heart murmur    dx at 26   MVP is stable   Hemorrhoids     Past Surgical History:  Procedure Laterality Date   NO PAST SURGERIES       reports that she has been smoking cigarettes. She has a 26.00 pack-year smoking history. She has never used smokeless tobacco. She reports that she does not currently use alcohol. She reports that she does not use drugs.  Allergies  Allergen Reactions   Codeine    Epinephrine (Anaphylaxis)    Orange Fruit  [Citrus] Other (See Comments)    Blisters on face.   Streptogramins    Tomato Other (See Comments)    Blisters on face    Family History  Problem Relation Age of Onset   Prostate cancer Father    Brain cancer Maternal Aunt    Prostate cancer Paternal Uncle    Stomach cancer Maternal Grandfather 86   Colon cancer Neg Hx    Colon polyps Neg Hx    Esophageal cancer Neg Hx    Rectal cancer Neg Hx      Prior to Admission medications   Medication Sig Start Date End Date Taking? Authorizing Provider  acetaminophen (TYLENOL) 325 MG tablet Take 650 mg by mouth every 6 (six) hours as needed for moderate pain.   Yes [provider]  capecitabine (XELODA) 500 MG tablet Take 2 tabs in morning and 3 tabs in evening every 12 hours. Take on days 1-14 of chemotherapy. Repeat every 21 days. Patient taking differently: Take 1,000 mg/m2 by mouth See admin instructions. Take 2 tabs in morning and 3 tabs in evening every 12 hours. Take on days 1-14 of chemotherapy. Repeat every 21 days. 03/23/21  Yes Truitt Merle, MD  cholecalciferol (VITAMIN D3) 25 MCG (1000 UNIT) tablet Take 1,000 Units by mouth daily.   Yes [provider]  ondansetron (ZOFRAN) 8 MG tablet Take 1 tablet (8 mg total) by mouth  2 (two) times daily as needed for refractory nausea / vomiting. Start on day 3 after chemotherapy. 03/06/21  Yes Truitt Merle, MD  traMADol (ULTRAM) 50 MG tablet Take 0.5-1 tablets (25-50 mg total) by mouth every 8 (eight) hours as needed. Patient taking differently: Take 25-50 mg by mouth every 8 (eight) hours as needed for moderate pain. 03/06/21  Yes Truitt Merle, MD  VALERIAN ROOT PO Take 1 tablet by mouth daily.   Yes [provider]  vitamin C (ASCORBIC ACID) 500 MG tablet Take 500 mg by mouth daily.   Yes [provider]  prochlorperazine (COMPAZINE) 10 MG tablet Take 1 tablet (10 mg total) by mouth every 6 (six) hours as needed (Nausea or vomiting). Patient not taking: No sig reported  03/06/21   Truitt Merle, MD    Physical Exam: Vitals:   05/18/21 2130 05/18/21 2200 05/18/21 2230 05/18/21 2300  BP: 117/84 107/80 (!) 135/99 108/74  Pulse: 95 92 98 92  Resp: 20 17 14 14   Temp:      TempSrc:      SpO2: 98% 98% 99% 98%  Weight:        Constitutional: Lethargic Eyes: PERRL, lids and conjunctivae normal ENMT: Mucous membranes are moist. Posterior pharynx clear of any exudate or lesions.Normal dentition.  Neck: normal, supple, no masses, no thyromegaly Respiratory: clear to auscultation bilaterally, no wheezing, no crackles. Normal respiratory effort. No accessory muscle use.  Cardiovascular: Regular rate and rhythm, no murmurs / rubs / gallops. No extremity edema. 1+ DP bilaterally. No carotid bruits.  Abdomen: Generalized TTP Musculoskeletal: no clubbing / cyanosis. No joint deformity upper and lower extremities. Good ROM, no contractures. Normal muscle tone.  Skin: no rashes, lesions, ulcers. No induration Neurologic: MAE, GCS 13 Psychiatric: Not oriented,   Labs on Admission: I have personally reviewed following labs and imaging studies  CBC: Recent Labs  Lab 05/18/21 1750  WBC 22.7*  NEUTROABS 14.1*  HGB 17.3*  HCT 46.9*  MCV 91.6  PLT 92*   Basic Metabolic Panel: Recent Labs  Lab 05/18/21 1750  NA 129*  K 5.0  CL 93*  CO2 28  GLUCOSE 132*  BUN 49*  CREATININE 0.81  CALCIUM 9.8  MG 2.3   GFR: Estimated Creatinine Clearance: 52.5 mL/min (by C-G formula based on SCr of 0.81 mg/dL). Liver Function Tests: Recent Labs  Lab 05/18/21 1750  AST 35  ALT 31  ALKPHOS 102  BILITOT 1.4*  PROT 6.0*  ALBUMIN 3.0*   No results for input(s): LIPASE, AMYLASE in the last 168 hours. Recent Labs  Lab 05/18/21 1751  AMMONIA 38*   Coagulation Profile: Recent Labs  Lab 05/18/21 2044  INR 1.3*   Cardiac Enzymes: No results for input(s): CKTOTAL, CKMB, CKMBINDEX, TROPONINI in the last 168 hours. BNP (last 3 results) No results for input(s):  PROBNP in the last 8760 hours. HbA1C: No results for input(s): HGBA1C in the last 72 hours. CBG: No results for input(s): GLUCAP in the last 168 hours. Lipid Profile: No results for input(s): CHOL, HDL, LDLCALC, TRIG, CHOLHDL, LDLDIRECT in the last 72 hours. Thyroid Function Tests: Recent Labs    05/18/21 2039  TSH 6.354*   Anemia Panel: No results for input(s): VITAMINB12, FOLATE, FERRITIN, TIBC, IRON, RETICCTPCT in the last 72 hours. Urine analysis:    Component Value Date/Time   COLORURINE YELLOW 05/18/2021 2101   APPEARANCEUR CLEAR 05/18/2021 2101   LABSPEC >1.046 (H) 05/18/2021 2101   PHURINE 6.0 05/18/2021 2101  GLUCOSEU NEGATIVE 05/18/2021 2101   HGBUR NEGATIVE 05/18/2021 2101   BILIRUBINUR NEGATIVE 05/18/2021 2101   [place] NEGATIVE 05/18/2021 2101   PROTEINUR NEGATIVE 05/18/2021 2101   NITRITE NEGATIVE 05/18/2021 2101   LEUKOCYTESUR NEGATIVE 05/18/2021 2101    Radiological Exams on Admission: DG Chest 1 View  Result Date: 05/18/2021 CLINICAL DATA:  Altered mental status.  Lethargic. EXAM: CHEST  1 VIEW COMPARISON:  None. FINDINGS: The heart size and mediastinal contours are within normal limits. Both lungs are clear. The visualized skeletal structures are unremarkable. IMPRESSION: No active disease. Electronically Signed   By: Nolon Nations M.D.   On: 05/18/2021 17:51   CT HEAD WO CONTRAST  Result Date: 05/18/2021 CLINICAL DATA:  Mental status change. EXAM: CT HEAD WITHOUT CONTRAST TECHNIQUE: Contiguous axial images were obtained from the base of the skull through the vertex without intravenous contrast. COMPARISON:  None. FINDINGS: Brain: Limited exam. Ventricles and sulci are appropriate for patient's age. No evidence for acute cortically based infarct, intracranial hemorrhage, mass lesion or mass-effect. Vascular: Unremarkable Skull: Intact. Sinuses/Orbits: Paranasal sinuses are well aerated. Mastoid air cells are unremarkable. Other: None. IMPRESSION: No acute  intracranial process. Limited exam due to patient positioning. Electronically Signed   By: Lovey Newcomer M.D.   On: 05/18/2021 18:53   CT ABDOMEN PELVIS W CONTRAST  Result Date: 05/18/2021 CLINICAL DATA:  Nausea and vomiting. Colorectal cancer. On chemotherapy. EXAM: CT ABDOMEN AND PELVIS WITH CONTRAST TECHNIQUE: Multidetector CT imaging of the abdomen and pelvis was performed using the standard protocol following bolus administration of intravenous contrast. CONTRAST:  44mL OMNIPAQUE IOHEXOL 350 MG/ML SOLN COMPARISON:  PET-CT 03/04/2021. CT chest abdomen and pelvis 02/08/2021. FINDINGS: Lower chest: No acute abnormality. Hepatobiliary: Limited evaluation secondary to motion artifact. Previously identified small liver lesions are not well seen on the study. Gallbladder is grossly within normal limits. Pancreas: Unremarkable. No pancreatic ductal dilatation or surrounding inflammatory changes. Spleen: Normal in size without focal abnormality. Adrenals/Urinary Tract: Adrenal glands are unremarkable. Kidneys are normal, without renal calculi, focal lesion, or hydronephrosis. Bladder is unremarkable. Stomach/Bowel: There is wall thickening and edema of the ascending colon. There is no surrounding inflammatory stranding. The appendix is within normal limits. There is also wall thickening and submucosal enhancement of ileal loops diffusely. Jejunal loops and stomach are within normal limits. There is no bowel obstruction, free air or pneumatosis identified. Chest there is wall thickening at the rectosigmoid junction in the region of previously identified mass. This has decreased when compared to the prior study. Vascular/Lymphatic: There are atherosclerotic calcifications throughout the aorta. There is no evidence for aneurysm. 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 Previously identified enlarged left pelvic lymph node is now normal in size. No definite new enlarged  lymph nodes are identified. Reproductive: Uterus and bilateral adnexa are unremarkable. Other: There is a small amount of ascites. There is no focal abdominal wall hernia. Musculoskeletal: No fracture is seen. IMPRESSION: 1. 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 2. Wall thickening of the ascending colon, cecum and ileum worrisome for enterocolitis. Findings may be infectious, inflammatory or ischemic given findings in the aorta. No pneumatosis or free air. 3. Small amount of ascites. 4. Rectosigmoid mass has decreased in size. Left pelvic lymph node has decreased in size. 5. Previously identified liver lesions not well characterized on this study secondary to motion artifact. Electronically Signed   By: Ronney Asters M.D.   On: 05/18/2021  19:57    EKG: Independently reviewed.  Assessment/Plan Principal Problem:   Acute encephalopathy Active Problems:   Malignant neoplasm of rectosigmoid junction (HCC)   Colitis   Dehydration   Aortic mural thrombus (HCC)   Thrombocytopenia (HCC)    Acute encephalopathy - Suspect secondary to colitis + dehydration. CTH neg Ammonia only slightly elevated at 38 Colitis - Possibly infectious Ischemic colitis considered, however less likely: 1 week of symptoms Lactate only 2.1 initially Thrombus could occlude the LMA but is too low to affect the SMA GI pathogen pnl C.Diff Empiric cefepime + flagyl Sepsis pathway (technically has only WBC and no other SIRS at this point). Serial lactates BCx Procalcitonin Repeat CBC in AM Dehydration - IVF: 2L bolus + LR at 150 for 20h Repeat CMP in AM Pre-renal - Repeat CMP in AM Hydration as above Aortic mural thrombus - EDP spoke with Dr. Trula Slade, see his note in chart Heparin gtt Thrombocytopenia - Probably due to consumption of platelets given the big aortic mural thrombus  DVT prophylaxis: Heparin gtt Code Status: Full Code - Was DNR in past but  code status reversed to full code as of 04/24/21 onc office visit (See Dr. Krista Blue note) Family Communication: No family in room Disposition Plan: TBD Consults called: Dr. Trula Slade Admission status: Place in obs   Zoejane Gaulin, [place] [facility]  How to contact the [facility] Attending or Consulting provider [place] or covering provider during after hours [place], for this patient?  Check the care team in [facility] and look for a) attending/consulting [facility] provider listed and b) the [facility] team listed Log into [email or web address]  Amion Physician Scheduling and messaging for groups and whole hospitals  On call and physician scheduling software for group practices, residents, hospitalists and other medical providers for call, clinic, rotation and shift schedules. On[employer] is a hospital-wide system for scheduling doctors and paging doctors on call. EasyPlot is for scientific plotting and data analysis.  [email or web address]  and use [place]'s universal password to access. If you do not have the password, please contact the hospital operator.  Locate the [facility] provider you are looking for under [facility] and page to a number that you can be directly reached. If you still have difficulty reaching the provider, please page the [facility] (Director on Call) for the Hospitalists listed on amion for assistance.  05/18/2021, 11:30 PM

## 2021-05-18 NOTE — Progress Notes (Signed)
ANTICOAGULATION CONSULT NOTE - Initial Consult  Pharmacy Consult for Heparin  Indication: Aortic Thrombus  Allergies  Allergen Reactions   Codeine    Epinephrine (Anaphylaxis)    Orange Fruit [Citrus] Other (See Comments)    Blisters on face.   Streptogramins    Tomato Other (See Comments)    Blisters on face    Patient Measurements:   Heparin Dosing Weight: recent oncology office visit weight ~ 50 kg  44.5 kg per ED  Vital Signs: Temp: 97.5 F (36.4 C) (10/01 1719) Temp Source: Rectal (10/01 1719) BP: 95/77 (10/01 1935) Pulse Rate: 90 (10/01 1935)  Labs: Recent Labs    05/18/21 1750  HGB 17.3*  HCT 46.9*  PLT 92*  CREATININE 0.81    CrCl cannot be calculated (Unknown ideal weight.).   Medical History: Past Medical History:  Diagnosis Date   Colorectal cancer ([place])    Family history of brain cancer    Family history of prostate cancer    Family history of stomach cancer    Heart murmur    dx at 26   MVP is stable   Hemorrhoids    Assessment: 60 y/o F with a recent diagnosis of rectosigmoid carcinoma on chemotherapy admitted with altered mental status. Pharmacy consulted to initiate heparin infusion for aortic thrombus per CT abdomen/pelvis.   Goal of Therapy:  Heparin level 0.3-0.7 units/ml Monitor platelets by anticoagulation protocol: Yes   Plan:  Give 2500 units bolus x 1 Start heparin infusion at 800 units/hr Check anti-Xa level in 6 hours and daily while on heparin Continue to monitor H&H and platelets  Ulice Dash D 05/18/2021,8:36 PM

## 2021-05-18 NOTE — ED Notes (Signed)
Notified primary RN of pt's critical VBG PO2 result 25.1

## 2021-05-18 NOTE — Progress Notes (Signed)
I reviewed the CT scan after speaking with Dr. Armandina Gemma.  The patient as stage 4 rectal cancer, having recently completed another round of chemo.  She came to the ER with altered mental status.  CT of the abdomen showed aortic mural thrombus which is much more significant than on her prior scan in June.  She still has palpable pedal pulses.  No vascular intervention is recommended inher current condition.  With preservation of her palpable pedal pulses and no evidence of embolic disease, she does not require open surgery of percutaneous stenting.   Given her current need for chemo and high probability of dehydration, she would probably benefit from anticoagulation to prevent further propagation of her thrombus and potential embolization.  I would continue this once she is discharged, given her hypercoaguable state from her cancer.  Please contact me if I can be of further assistance.  Annamarie Major, MD Vascular Surgery [phone]

## 2021-05-18 NOTE — Sepsis Progress Note (Signed)
Following for sepsis monitoring ?

## 2021-05-18 NOTE — ED Triage Notes (Signed)
Patient presents to the ed from home with c/o alter mental status. Patient is a cancer patient last chemo was tuesday 05/14/21. She been sitting in the couch's since wed and been very lethargy and patient spouse been unable to get her out of the couch. She is not verbal at all and this is not her baseline.

## 2021-05-18 NOTE — Progress Notes (Signed)
Pharmacy Antibiotic Note  Angela Mcpherson is a 60 y.o. female admitted on 05/18/2021 with intra-abdominal infection.  Pharmacy has been consulted for Cefepime dosing.  Plan: Cefepime 2gm IV q12h Flagyl 500mg  IV q12h Follow renal function F/u culture results  Weight: 44.5 kg (98 lb)  Temp (24hrs), Avg:97.5 F (36.4 C), Min:97.5 F (36.4 C), Max:97.5 F (36.4 C)  Recent Labs  Lab 05/18/21 1750 05/18/21 1903 05/18/21 2039  WBC 22.7*  --   --   CREATININE 0.81  --   --   LATICACIDVEN  --  2.1* 2.8*    Estimated Creatinine Clearance: 52.5 mL/min (by C-G formula based on SCr of 0.81 mg/dL).    Allergies  Allergen Reactions   Codeine    Epinephrine (Anaphylaxis)    Orange Fruit [Citrus] Other (See Comments)    Blisters on face.   Streptogramins    Tomato Other (See Comments)    Blisters on face    Antimicrobials this admission: 10/1 Ceftriaxone x 1 10/1 Metronidazole >>   10/2 Cefepime >>  Dose adjustments this admission:    Microbiology results: 10/1 BCx:   10/1 UCx:      Thank you for allowing pharmacy to be a part of this patient's care.  Everette Rank, PharmD 05/18/2021 11:32 PM

## 2021-05-18 NOTE — ED Notes (Signed)
Patient transported to CT 

## 2021-05-18 NOTE — ED Provider Notes (Signed)
[facility] DEPT Provider Note   CSN: [id number] Arrival date & time: 05/18/21  1638     History No chief complaint on file.   Angela Mcpherson is a 60 y.o. female.  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  Level 5 caveat applies due to patient mental status.  The patient presented from home for worsening mental status over the past few days.  She is a cancer patient and follows outpatient with oncology and had been recently requesting a holiday from her chemotherapy.  She was last normal after her last chemotherapy treatment on Tuesday, 05/14/2021.  She is reportedly now nonverbal and has been significantly lethargic per family members at home.  Additional history was provided by the patient's husband who arrived to the emergency department and was present bedside after patient arrival.  Per husband, she has not been at her baseline since this past Tuesday. Progressively worsening. Hasn't been getting out of the couch. Probably spent all night covered in her own stool. Worsening mental status. Not eating or drinking. Any attempts at eating, she has been vomiting. No emesis the past two days.  Past Medical History:  Diagnosis Date   Colorectal cancer ([place])    Family history of brain cancer    Family history of prostate cancer    Family history of stomach cancer    Heart murmur    dx at 26   MVP is stable   Hemorrhoids     Patient Active Problem List   Diagnosis Date Noted   Genetic testing 04/02/2021   DNR (do not resuscitate) 03/13/2021   Family history of stomach cancer 03/04/2021   Family history of brain cancer 03/04/2021   Family history of prostate cancer 03/04/2021   Malignant neoplasm of rectosigmoid junction ([place]) 02/12/2021    Past Surgical History:  Procedure Laterality Date   NO PAST SURGERIES       OB  History   No obstetric history on file.     Family History  Problem Relation Age of Onset   Prostate cancer Father    Brain cancer Maternal Aunt    Prostate cancer Paternal Uncle    Stomach cancer Maternal Grandfather 71   Colon cancer Neg Hx    Colon polyps Neg Hx    Esophageal cancer Neg Hx    Rectal cancer Neg Hx     Social History   Tobacco Use   Smoking status: Every Day    Packs/day: 1.00    Years: 26.00    Pack years: 26.00    Types: Cigarettes   Smokeless tobacco: Never  Vaping Use   Vaping Use: Never used  Substance Use Topics   Alcohol use: Not Currently    Comment: used to drink moderately   Drug use: Never    Home Medications Prior to Admission medications   Medication Sig Start Date End Date Taking? Authorizing Provider  acetaminophen (TYLENOL) 325 MG tablet Take 650 mg by mouth every 6 (six) hours as needed for moderate pain.   Yes [provider]  capecitabine (XELODA) 500 MG tablet Take 2 tabs in morning and 3 tabs in evening every 12 hours. Take on days 1-14 of chemotherapy. Repeat every 21 days. Patient taking differently: Take 1,000 mg/m2 by mouth See admin instructions. Take 2 tabs in morning and 3 tabs in evening every 12 hours. Take on days 1-14  of chemotherapy. Repeat every 21 days. 03/23/21  Yes Truitt Merle, MD  cholecalciferol (VITAMIN D3) 25 MCG (1000 UNIT) tablet Take 1,000 Units by mouth daily.   Yes [provider]  ondansetron (ZOFRAN) 8 MG tablet Take 1 tablet (8 mg total) by mouth 2 (two) times daily as needed for refractory nausea / vomiting. Start on day 3 after chemotherapy. 03/06/21  Yes Truitt Merle, MD  traMADol (ULTRAM) 50 MG tablet Take 0.5-1 tablets (25-50 mg total) by mouth every 8 (eight) hours as needed. Patient taking differently: Take 25-50 mg by mouth every 8 (eight) hours as needed for moderate pain. 03/06/21  Yes Truitt Merle, MD  VALERIAN ROOT PO Take 1 tablet by mouth daily.   Yes [provider]  vitamin C  (ASCORBIC ACID) 500 MG tablet Take 500 mg by mouth daily.   Yes [provider]  prochlorperazine (COMPAZINE) 10 MG tablet Take 1 tablet (10 mg total) by mouth every 6 (six) hours as needed (Nausea or vomiting). Patient not taking: No sig reported 03/06/21   Truitt Merle, MD    Allergies    Codeine, Epinephrine (anaphylaxis), Orange fruit [citrus], Streptogramins, and Tomato  Review of Systems   Review of Systems  Unable to perform ROS: Mental status change   Physical Exam Updated Vital Signs BP (!) 135/99   Pulse 98   Temp (!) 97.5 F (36.4 C) (Rectal)   Resp 14   Wt 44.5 kg   SpO2 99%   BMI 17.36 kg/m   Physical Exam Vitals and nursing note reviewed.  Constitutional:      General: She is not in acute distress.    Appearance: She is well-developed.     Comments: GCS 13 (4-4-5), saying "stop it" in response to painful stimuli, but otherwise not following commands.  HENT:     Head: Normocephalic and atraumatic.  Eyes:     Conjunctiva/sclera: Conjunctivae normal.  Neck:     Comments: No nuchal rigidity, negative meningeal signs Cardiovascular:     Rate and Rhythm: Normal rate and regular rhythm.     Heart sounds: No murmur heard.    Comments: 1+ DP pulses bilaterally Pulmonary:     Effort: Pulmonary effort is normal. No respiratory distress.     Breath sounds: Normal breath sounds.  Abdominal:     Palpations: Abdomen is soft.     Tenderness: There is generalized abdominal tenderness.  Musculoskeletal:     Cervical back: Normal range of motion and neck supple. No rigidity.  Skin:    General: Skin is warm and dry.  Neurological:     General: No focal deficit present.     Mental Status: She is alert. She is disoriented.     Comments: Moving all four extremities in response to pain    ED Results / Procedures / Treatments   Labs (all labs ordered are listed, but only abnormal results are displayed) Labs Reviewed  COMPREHENSIVE METABOLIC PANEL - Abnormal;  Notable for the following components:      Result Value   Sodium 129 (*)    Chloride 93 (*)    Glucose, Bld 132 (*)    BUN 49 (*)    Total Protein 6.0 (*)    Albumin 3.0 (*)    Total Bilirubin 1.4 (*)    All other components within normal limits  CBC WITH DIFFERENTIAL/PLATELET - Abnormal; Notable for the following components:   WBC 22.7 (*)    RBC 5.12 (*)  Hemoglobin 17.3 (*)    HCT 46.9 (*)    MCHC 36.9 (*)    RDW 19.0 (*)    Platelets 92 (*)    Neutro Abs 14.1 (*)    Lymphs Abs 5.7 (*)    Monocytes Absolute 2.6 (*)    Abs Immature Granulocytes 0.32 (*)    All other components within normal limits  URINALYSIS, COMPLETE (UACMP) WITH MICROSCOPIC - Abnormal; Notable for the following components:   Specific Gravity, Urine >1.046 (*)    All other components within normal limits  AMMONIA - Abnormal; Notable for the following components:   Ammonia 38 (*)    All other components within normal limits  BLOOD GAS, VENOUS - Abnormal; Notable for the following components:   pO2, Ven 25.1 (*)    Bicarbonate 28.8 (*)    Acid-Base Excess 3.8 (*)    All other components within normal limits  LACTIC ACID, PLASMA - Abnormal; Notable for the following components:   Lactic Acid, Venous 2.1 (*)    All other components within normal limits  LACTIC ACID, PLASMA - Abnormal; Notable for the following components:   Lactic Acid, Venous 2.8 (*)    All other components within normal limits  TSH - Abnormal; Notable for the following components:   TSH 6.354 (*)    All other components within normal limits  PROTIME-INR - Abnormal; Notable for the following components:   Prothrombin Time 16.6 (*)    INR 1.3 (*)    All other components within normal limits  RESP PANEL BY RT-PCR (FLU A&B, COVID) ARPGX2  CULTURE, BLOOD (ROUTINE X 2)  CULTURE, BLOOD (ROUTINE X 2)  URINE CULTURE  C DIFFICILE QUICK SCREEN W PCR REFLEX    GASTROINTESTINAL PANEL BY PCR, STOOL (REPLACES STOOL CULTURE)  RAPID URINE DRUG  SCREEN, HOSP PERFORMED  MAGNESIUM  APTT  T4, FREE  CBC  HEPARIN LEVEL (UNFRACTIONATED)  D-DIMER, QUANTITATIVE  CBG MONITORING, ED  TROPONIN I (HIGH SENSITIVITY)    EKG EKG Interpretation  Date/Time:  Saturday May 18 2021 17:59:16 EDT Ventricular Rate:  102 PR Interval:  131 QRS Duration: 98 QT Interval:  424 QTC Calculation: 553 R Axis:   75 Text Interpretation: Sinus tachycardia Right atrial enlargement Prolonged QT interval Confirmed by Regan Lemming ([id number]) on 05/18/2021 6:05:57 PM  Radiology DG Chest 1 View  Result Date: 05/18/2021 CLINICAL DATA:  Altered mental status.  Lethargic. EXAM: CHEST  1 VIEW COMPARISON:  None. FINDINGS: The heart size and mediastinal contours are within normal limits. Both lungs are clear. The visualized skeletal structures are unremarkable. IMPRESSION: No active disease. Electronically Signed   By: Nolon Nations M.D.   On: 05/18/2021 17:51   CT HEAD WO CONTRAST  Result Date: 05/18/2021 CLINICAL DATA:  Mental status change. EXAM: CT HEAD WITHOUT CONTRAST TECHNIQUE: Contiguous axial images were obtained from the base of the skull through the vertex without intravenous contrast. COMPARISON:  None. FINDINGS: Brain: Limited exam. Ventricles and sulci are appropriate for patient's age. No evidence for acute cortically based infarct, intracranial hemorrhage, mass lesion or mass-effect. Vascular: Unremarkable Skull: Intact. Sinuses/Orbits: Paranasal sinuses are well aerated. Mastoid air cells are unremarkable. Other: None. IMPRESSION: No acute intracranial process. Limited exam due to patient positioning. Electronically Signed   By: Lovey Newcomer M.D.   On: 05/18/2021 18:53   CT ABDOMEN PELVIS W CONTRAST  Result Date: 05/18/2021 CLINICAL DATA:  Nausea and vomiting. Colorectal cancer. On chemotherapy. EXAM: CT ABDOMEN AND PELVIS WITH CONTRAST TECHNIQUE: Multidetector  CT imaging of the abdomen and pelvis was performed using the standard protocol following  bolus administration of intravenous contrast. CONTRAST:  50m OMNIPAQUE IOHEXOL 350 MG/ML SOLN COMPARISON:  PET-CT 03/04/2021. CT chest abdomen and pelvis 02/08/2021. FINDINGS: Lower chest: No acute abnormality. Hepatobiliary: Limited evaluation secondary to motion artifact. Previously identified small liver lesions are not well seen on the study. Gallbladder is grossly within normal limits. Pancreas: Unremarkable. No pancreatic ductal dilatation or surrounding inflammatory changes. Spleen: Normal in size without focal abnormality. Adrenals/Urinary Tract: Adrenal glands are unremarkable. Kidneys are normal, without renal calculi, focal lesion, or hydronephrosis. Bladder is unremarkable. Stomach/Bowel: There is wall thickening and edema of the ascending colon. There is no surrounding inflammatory stranding. The appendix is within normal limits. There is also wall thickening and submucosal enhancement of ileal loops diffusely. Jejunal loops and stomach are within normal limits. There is no bowel obstruction, free air or pneumatosis identified. Chest there is wall thickening at the rectosigmoid junction in the region of previously identified mass. This has decreased when compared to the prior study. Vascular/Lymphatic: There are atherosclerotic calcifications throughout the aorta. There is no evidence for aneurysm. 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 Previously identified enlarged left pelvic lymph node is now normal in size. No definite new enlarged lymph nodes are identified. Reproductive: Uterus and bilateral adnexa are unremarkable. Other: There is a small amount of ascites. There is no focal abdominal wall hernia. Musculoskeletal: No fracture is seen. IMPRESSION: 1. 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 2. Wall thickening of the ascending colon, cecum and ileum worrisome for enterocolitis. Findings may  be infectious, inflammatory or ischemic given findings in the aorta. No pneumatosis or free air. 3. Small amount of ascites. 4. Rectosigmoid mass has decreased in size. Left pelvic lymph node has decreased in size. 5. Previously identified liver lesions not well characterized on this study secondary to motion artifact. Electronically Signed   By: ARonney AstersM.D.   On: 05/18/2021 19:57    Procedures .Critical Care Performed by: LRegan Lemming MD Authorized by: LRegan Lemming MD   Critical care provider statement:    Critical care time (minutes):  82   Critical care was necessary to treat or prevent imminent or life-threatening deterioration of the following conditions:  Sepsis   Critical care was time spent personally by me on the following activities:  Discussions with consultants, evaluation of patient's response to treatment, examination of patient, ordering and performing treatments and interventions, ordering and review of laboratory studies, ordering and review of radiographic studies, pulse oximetry, re-evaluation of patient's condition, obtaining history from patient or surrogate and review of old charts   Medications Ordered in ED Medications  lactated ringers infusion ( Intravenous New Bag/Given 05/18/21 2143)  heparin ADULT infusion 100 units/mL (25000 units/2531m (800 Units/hr Intravenous New Bag/Given 05/18/21 2142)  cefTRIAXone (ROCEPHIN) 1 g in sodium chloride 0.9 % 100 mL IVPB (0 g Intravenous Stopped 05/18/21 2033)  iohexol (OMNIPAQUE) 350 MG/ML injection 80 mL (80 mLs Intravenous Contrast Given 05/18/21 1915)  lactated ringers bolus 1,000 mL (1,000 mLs Intravenous New Bag/Given 05/18/21 2059)  lactated ringers bolus 1,000 mL (1,000 mLs Intravenous New Bag/Given 05/18/21 2100)  metroNIDAZOLE (FLAGYL) IVPB 500 mg (0 mg Intravenous Stopped 05/18/21 2234)  heparin bolus via infusion 2,500 Units (2,500 Units Intravenous Bolus from Bag 05/18/21 2142)    ED Course  I have reviewed  the triage vital signs and the  nursing notes.  Pertinent labs & imaging results that were available during my care of the patient were reviewed by me and considered in my medical decision making (see chart for details).    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  Her exam is most notable for acute encephalopathy. No clear evidence for CVA. Last known normal was five days ago. Moving all four extremities in response to painful stimuli, no clear cranial nerve deficit. Afebrile, mildly hypothermic, met SIRS criteria with a leukocytosis, mild hypothermia to 97.2F, pulse elevated above 90.   My immediate concern is for a life-threatening infection in this patient.  The most likely infectious source is undifferentiated, possibly abdominal.  Thorough skin exam revealed no significant open wounds.   For treatment, the patient was given 2L LR for IVF and Rocephin and Flagyl for antimicrobials.  Labs: Laboratory work-up significant for VBG without acidosis, pH 7.41, PCO2 46, CBC with a leukocytosis to 23, elevated hemoglobin to 17.3, suspect hemoconcentration in the setting of dehydration, platelets thrombocytopenic to 92.  The patient had mild hyponatremia at 129, blood glucose mildly elevated 132, mild uremia to 49, normal creatinine 0.81, normal calcium 9.8, normal liver function, no acidosis, COVID-19 and influenza PCR testing resulted negative, magnesium normal, TSH mildly elevated to 6.35, free T4 pending, lactic acid initially elevated to 2.2, climbing to 2.8, urinalysis without evidence of urinary tract infection, UDS normal, D-dimer and troponin collected and pending, ammonia mildly elevated to 38.  Imaging: CT abdomen pelvis revealed CT head without acute intracranial abnormality, no evidence of mass  lesions or tumor burden.  Chest x-ray without acute abnormality.  CT abdomen pelvis revealed a colitis. 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  The patient's rectosigmoid mass has decreased in size.  On reassessment, the patient remained encephalopathic. She remained hemodynamically stable. Suspect encephalopathy in the setting of sepsis from a likely colitis as seen on CT.  Based off the presentation and lab findings, the patient meets criteria for severe sepsis.  [SEVERE SEPSIS] HYPOTENSION (Nursing BPA for hypotension = Is this sepsis?) CREATININE > 2.0, or urine output < 0.5 mL/kg/hour for 2 hours  BILIRUBIN > 2 mg/dL (34.2 mmol/L)  PLATELET COUNT < 100,000  INR > 1.5 or aPTT > 60 sec  LACTATE > 2 mmol/L   [SEPTIC SHOCK] HYPOTENSION REFRACTORY TO 30 cc/kg OF FLUIDS LACTATE > 4 mmol/L  While in the ED, the patient was given: Medications  lactated ringers infusion ( Intravenous New Bag/Given 05/18/21 2143)  heparin ADULT infusion 100 units/mL (25000 units/217m) (800 Units/hr Intravenous New Bag/Given 05/18/21 2142)  cefTRIAXone (ROCEPHIN) 1 g in sodium chloride 0.9 % 100 mL IVPB (0 g Intravenous Stopped 05/18/21 2033)  iohexol (OMNIPAQUE) 350 MG/ML injection 80 mL (80 mLs Intravenous Contrast Given 05/18/21 1915)  lactated ringers bolus 1,000 mL (1,000 mLs Intravenous New Bag/Given 05/18/21 2059)  lactated ringers bolus 1,000 mL (1,000 mLs Intravenous New Bag/Given 05/18/21 2100)  metroNIDAZOLE (FLAGYL) IVPB 500 mg (  0 mg Intravenous Stopped 05/18/21 2234)  heparin bolus via infusion 2,500 Units (2,500 Units Intravenous Bolus from Bag 05/18/21 2142)   I spoke with vascular surgery on-call, Dr. Trula Slade, who did not  recommend surgical intervention at this time.  Did recommend continued anticoagulation as there is always the possibility that the patient's mural thrombus could break off and embolized to her distal lower extremities.  Currently the patient still has 1+ pulses bilaterally with no evidence for acute limb ischemia at this time.  Given the location of the thrombus, no clear evidence for mesenteric ischemia as etiology of the patient's colitis.  The patient was started on heparin bolus and infusion.  Angela Mcpherson was admitted to the hospital for ongoing treatment and monitoring.   Final Clinical Impression(s) / ED Diagnoses Final diagnoses:  Altered mental status, unspecified altered mental status type  Sepsis, due to unspecified organism, unspecified whether acute organ dysfunction present [facility])  Aortic thrombus [facility])    Rx / DC Orders ED Discharge Orders     None        Regan Lemming, MD 05/19/21 1658

## 2021-05-19 ENCOUNTER — Other Ambulatory Visit: Payer: Self-pay

## 2021-05-19 ENCOUNTER — Encounter (HOSPITAL_COMMUNITY): Payer: Self-pay | Admitting: Internal Medicine

## 2021-05-19 DIAGNOSIS — E8770 Fluid overload, unspecified: Secondary | ICD-10-CM | POA: Diagnosis not present

## 2021-05-19 DIAGNOSIS — I7410 Embolism and thrombosis of unspecified parts of aorta: Secondary | ICD-10-CM | POA: Diagnosis present

## 2021-05-19 DIAGNOSIS — A0811 Acute gastroenteropathy due to Norwalk agent: Secondary | ICD-10-CM | POA: Diagnosis present

## 2021-05-19 DIAGNOSIS — G9341 Metabolic encephalopathy: Secondary | ICD-10-CM | POA: Diagnosis present

## 2021-05-19 DIAGNOSIS — F17210 Nicotine dependence, cigarettes, uncomplicated: Secondary | ICD-10-CM | POA: Diagnosis present

## 2021-05-19 DIAGNOSIS — E860 Dehydration: Secondary | ICD-10-CM | POA: Diagnosis present

## 2021-05-19 DIAGNOSIS — C799 Secondary malignant neoplasm of unspecified site: Secondary | ICD-10-CM | POA: Diagnosis present

## 2021-05-19 DIAGNOSIS — R0602 Shortness of breath: Secondary | ICD-10-CM | POA: Diagnosis not present

## 2021-05-19 DIAGNOSIS — G9340 Encephalopathy, unspecified: Secondary | ICD-10-CM | POA: Diagnosis not present

## 2021-05-19 DIAGNOSIS — Z681 Body mass index (BMI) 19 or less, adult: Secondary | ICD-10-CM | POA: Diagnosis not present

## 2021-05-19 DIAGNOSIS — Z8042 Family history of malignant neoplasm of prostate: Secondary | ICD-10-CM | POA: Diagnosis not present

## 2021-05-19 DIAGNOSIS — K633 Ulcer of intestine: Secondary | ICD-10-CM | POA: Diagnosis present

## 2021-05-19 DIAGNOSIS — I7409 Other arterial embolism and thrombosis of abdominal aorta: Secondary | ICD-10-CM | POA: Diagnosis present

## 2021-05-19 DIAGNOSIS — I513 Intracardiac thrombosis, not elsewhere classified: Secondary | ICD-10-CM | POA: Diagnosis present

## 2021-05-19 DIAGNOSIS — E43 Unspecified severe protein-calorie malnutrition: Secondary | ICD-10-CM | POA: Diagnosis present

## 2021-05-19 DIAGNOSIS — Z800 Family history of malignant neoplasm of digestive organs: Secondary | ICD-10-CM | POA: Diagnosis not present

## 2021-05-19 DIAGNOSIS — E876 Hypokalemia: Secondary | ICD-10-CM | POA: Diagnosis present

## 2021-05-19 DIAGNOSIS — E871 Hypo-osmolality and hyponatremia: Secondary | ICD-10-CM | POA: Diagnosis present

## 2021-05-19 DIAGNOSIS — C19 Malignant neoplasm of rectosigmoid junction: Secondary | ICD-10-CM | POA: Diagnosis present

## 2021-05-19 DIAGNOSIS — T451X5A Adverse effect of antineoplastic and immunosuppressive drugs, initial encounter: Secondary | ICD-10-CM | POA: Diagnosis present

## 2021-05-19 DIAGNOSIS — R188 Other ascites: Secondary | ICD-10-CM | POA: Diagnosis present

## 2021-05-19 DIAGNOSIS — E872 Acidosis, unspecified: Secondary | ICD-10-CM | POA: Diagnosis present

## 2021-05-19 DIAGNOSIS — Z808 Family history of malignant neoplasm of other organs or systems: Secondary | ICD-10-CM | POA: Diagnosis not present

## 2021-05-19 DIAGNOSIS — K529 Noninfective gastroenteritis and colitis, unspecified: Secondary | ICD-10-CM | POA: Diagnosis not present

## 2021-05-19 DIAGNOSIS — Z20822 Contact with and (suspected) exposure to covid-19: Secondary | ICD-10-CM | POA: Diagnosis present

## 2021-05-19 DIAGNOSIS — D6959 Other secondary thrombocytopenia: Secondary | ICD-10-CM | POA: Diagnosis present

## 2021-05-19 LAB — CBC
HCT: 39.7 % (ref 36.0–46.0)
Hemoglobin: 14.2 g/dL (ref 12.0–15.0)
MCH: 33.8 pg (ref 26.0–34.0)
MCHC: 35.8 g/dL (ref 30.0–36.0)
MCV: 94.5 fL (ref 80.0–100.0)
Platelets: 82 10*3/uL — ABNORMAL LOW (ref 150–400)
RBC: 4.2 MIL/uL (ref 3.87–5.11)
RDW: 19.1 % — ABNORMAL HIGH (ref 11.5–15.5)
WBC: 20.9 10*3/uL — ABNORMAL HIGH (ref 4.0–10.5)
nRBC: 0 % (ref 0.0–0.2)

## 2021-05-19 LAB — COMPREHENSIVE METABOLIC PANEL
ALT: 26 U/L (ref 0–44)
AST: 27 U/L (ref 15–41)
Albumin: 2.2 g/dL — ABNORMAL LOW (ref 3.5–5.0)
Alkaline Phosphatase: 73 U/L (ref 38–126)
Anion gap: 7 (ref 5–15)
BUN: 35 mg/dL — ABNORMAL HIGH (ref 6–20)
CO2: 27 mmol/L (ref 22–32)
Calcium: 8.6 mg/dL — ABNORMAL LOW (ref 8.9–10.3)
Chloride: 98 mmol/L (ref 98–111)
Creatinine, Ser: 0.67 mg/dL (ref 0.44–1.00)
GFR, Estimated: >60 mL/min (ref >60)
Glucose, Bld: 112 mg/dL — ABNORMAL HIGH (ref 70–99)
Potassium: 4.5 mmol/L (ref 3.5–5.1)
Sodium: 132 mmol/L — ABNORMAL LOW (ref 135–145)
Total Bilirubin: 0.8 mg/dL (ref 0.3–1.2)
Total Protein: 4.5 g/dL — ABNORMAL LOW (ref 6.5–8.1)

## 2021-05-19 LAB — HEPARIN LEVEL (UNFRACTIONATED)
Heparin Unfractionated: 0.65 IU/mL (ref 0.30–0.70)
Heparin Unfractionated: 0.67 IU/mL (ref 0.30–0.70)

## 2021-05-19 LAB — T4, FREE: Free T4: 0.88 ng/dL (ref 0.61–1.12)

## 2021-05-19 LAB — PROTIME-INR
INR: 1.5 — ABNORMAL HIGH (ref 0.8–1.2)
Prothrombin Time: 18.1 seconds — ABNORMAL HIGH (ref 11.4–15.2)

## 2021-05-19 LAB — LACTIC ACID, PLASMA
Lactic Acid, Venous: 3.2 mmol/L (ref 0.5–1.9)
Lactic Acid, Venous: 1.9 mmol/L (ref 0.5–1.9)

## 2021-05-19 LAB — MAGNESIUM: Magnesium: 1.9 mg/dL (ref 1.7–2.4)

## 2021-05-19 LAB — FIBRINOGEN: Fibrinogen: 205 mg/dL — ABNORMAL LOW (ref 210–475)

## 2021-05-19 LAB — TROPONIN I (HIGH SENSITIVITY): Troponin I (High Sensitivity): 9 ng/L (ref <18)

## 2021-05-19 LAB — CORTISOL-AM, BLOOD: Cortisol - AM: 37.5 ug/dL — ABNORMAL HIGH (ref 6.7–22.6)

## 2021-05-19 LAB — HIV ANTIBODY (ROUTINE TESTING W REFLEX): HIV Screen 4th Generation wRfx: NONREACTIVE

## 2021-05-19 MED ORDER — LACTATED RINGERS IV SOLN: INTRAVENOUS | Status: DC

## 2021-05-19 MED ORDER — SODIUM CHLORIDE 0.9 % IV BOLUS
1000.0000 mL | Freq: Once | INTRAVENOUS | Status: AC
  Administered 2021-05-19: 1000 mL via INTRAVENOUS

## 2021-05-19 MED ORDER — NICOTINE 21 MG/24HR TD PT24
21.0000 mg | MEDICATED_PATCH | Freq: Every day | TRANSDERMAL | Status: DC
  Administered 2021-05-19 – 2021-05-21 (×3): 21 mg via TRANSDERMAL
  Filled 2021-05-19 (×8): qty 1

## 2021-05-19 MED ORDER — PROCHLORPERAZINE EDISYLATE 10 MG/2ML IJ SOLN
10.0000 mg | Freq: Four times a day (QID) | INTRAMUSCULAR | Status: DC | PRN
  Administered 2021-05-21: 10 mg via INTRAVENOUS
  Filled 2021-05-19: qty 2

## 2021-05-19 MED ORDER — TRAMADOL HCL 50 MG PO TABS
25.0000 mg | ORAL_TABLET | Freq: Three times a day (TID) | ORAL | Status: DC | PRN
  Administered 2021-05-25 – 2021-05-26 (×2): 50 mg via ORAL
  Filled 2021-05-19 (×2): qty 1

## 2021-05-19 MED ORDER — ZINC OXIDE 40 % EX OINT
TOPICAL_OINTMENT | CUTANEOUS | Status: DC | PRN
  Filled 2021-05-19 (×4): qty 57

## 2021-05-19 NOTE — ED Notes (Signed)
Bed alarm placed under PT

## 2021-05-19 NOTE — Progress Notes (Signed)
   05/19/21 1450  Assess: MEWS Score  Temp (!) 97.5 F (36.4 C)  BP 105/81  Pulse Rate (!) 108  Resp 12  Assess: MEWS Score  MEWS Temp 0  MEWS Systolic 0  MEWS Pulse 1  MEWS RR 1  MEWS LOC 0  MEWS Score 2  MEWS Score Color Yellow  Assess: if the MEWS score is Yellow or Red  Were vital signs taken at a resting state? Yes  Focused Assessment No change from prior assessment  Does the patient meet 2 or more of the SIRS criteria? Yes  Does the patient have a confirmed or suspected source of infection? Yes  Provider and Rapid Response Notified? No  MEWS guidelines implemented *See Row Information* Yes  Treat  MEWS Interventions Administered scheduled meds/treatments  Pain Scale 0-10  Pain Score 0  Take Vital Signs  Increase Vital Sign Frequency  Yellow: Q 2hr X 2 then Q 4hr X 2, if remains yellow, continue Q 4hrs  Escalate  MEWS: Escalate Yellow: discuss with charge nurse/RN and consider discussing with provider and RRT  Notify: Charge Nurse/RN  Name of Charge Nurse/RN Notified Abby, RN  Date Charge Nurse/RN Notified 05/19/21  Time Charge Nurse/RN Notified 1500  Document  Progress note created (see row info) Yes  Assess: SIRS CRITERIA  SIRS Temperature  0  SIRS Pulse 1  SIRS Respirations  0  SIRS WBC 1  SIRS Score Sum  2  Pt stable and sleeping at this time. Will continue to monitor

## 2021-05-19 NOTE — Progress Notes (Signed)
PROGRESS NOTE    Angela Mcpherson  HQP:[id number] DOB: 31-Aug-1960 DOA: 05/18/2021 PCP: Angela Merle, MD    No chief complaint on file.   Brief Narrative:  HPI per Dr. Theodore Demark is a 60 y.o. female with medical history significant of stage 4 rectal cancer.  Pt presents to ED for AMS.   Pt with generalized weakness, watery diarrhea for past week.  AMS over past couple of days.   She was last normal after her last chemotherapy treatment on Tuesday, 05/14/2021.  She is reportedly now nonverbal and has been significantly lethargic per family members at home and husband at bedside in ED.   Per husband, she has not been at her baseline since this past Tuesday. Progressively worsening. Hasn't been getting out of the couch. Probably spent all night covered in her own stool. Worsening mental status. Not eating or drinking. Any attempts at eating, she has been vomiting. No emesis the past two days.   Pt unable to contribute to history taking due to AMS.     ED Course: Pt with leukocytosis, labs also look significantly dehydrated.   CT scan reveals mural thrombus of decending aorta, severe stenosis but non occlusive.   Pt does have B DP pulses.    Assessment & Plan:   Principal Problem:   Acute encephalopathy Active Problems:   Malignant neoplasm of rectosigmoid junction (HCC)   Colitis   Dehydration   Aortic mural thrombus (HCC)   Thrombocytopenia (HCC)   #1 acute metabolic encephalopathy -Likely multifactorial secondary to acute colitis in the setting of dehydration. -Ammonia level slightly elevated at 38. -CT head negative for any acute abnormalities. -Blood cultures pending, C. difficile PCR and GI pathogen panel pending, urine cultures pending. -Continue hydration with IV fluids, empiric IV antibiotics. -Supportive care.  2.  Colitis -CT scan of abdomen and pelvis with wall thickening of the ascending colon, cecum, ileum concerning for enterocolitis. -Patient noted  to have had watery loose stools prior to admission. -C. difficile PCR, GI pathogen panel pending. -Continue empiric IV cefepime, IV Flagyl. -IV fluids. -Supportive care.  3.  Dehydration -Likely secondary to GI losses. -IV fluids. -Supportive care.  4.  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 -Chart reviewed and scans reviewed by vascular surgeon, Dr.Brabham who noted that patient does have palpable pedal pulses, no vascular intervention recommended at this time and her current condition with preservation of palpable pedal pulses and no evidence of embolic disease is felt patient does not require open surgery or percutaneous stenting. -Per vascular surgery recommending anticoagulation and patient started on IV heparin and will likely need to be transition to oral anticoagulation on discharge however will defer to hematology/oncology on type of anticoagulation. -Supportive care.  5.  Thrombocytopenia -Felt secondary to consumption of platelets given large aortic mural thrombus. -Patient with no bleeding. -Platelet count at 82,000. -Follow.  6.  Leukocytosis -Patient pancultured. -C. difficile PCR, GI pathogen panel also ordered and pending. -Continue empiric IV cefepime, Flagyl.  5.  Metastatic stage IV colon cancer -Patient on first-line chemotherapy of CAPOX in beva. -Patient seen by oncology who are recommending to hold chemotherapy until patient recovers from acute illness and may not receive beva in the future due to thrombosis. -Supportive care. -Per oncology.   DVT prophylaxis: Heparin Code Status: Full Family Communication: No family at bedside. Disposition:   Status is: Inpatient  The patient will require care spanning > 2 midnights and  should be moved to inpatient because: Inpatient level of care appropriate due to severity of illness  Dispo: The patient is from: Home              Anticipated d/c  is to:  TBD              Patient currently is not medically stable to d/c.   Difficult to place patient No       Consultants:  Vascular surgery: Dr. Trula Slade Oncology: Dr. Burr Medico  Procedures:  CT abdomen pelvis 05/18/2021 CT head 05/18/2021 Chest x-ray 05/18/2021  Antimicrobials:  IV cefepime 05/19/2021>>>> Rocephin 05/18/2021 x1 dose IV Flagyl 05/19/2021>>>>>> 05/26/2021    Subjective: Patient very confused.  In mittens.  Squirming in the bed.  Following a few commands.  Per RN no bowel movement today.  Per RN patient noted to have been soaked in feces on initial arrival to the ED last night.  Objective: Vitals:   05/19/21 0500 05/19/21 0600 05/19/21 0630 05/19/21 0700  BP: 105/80 128/89 105/82 108/79  Pulse: 89 90 90 (!) 103  Resp: 15 11 10 15   Temp:      TempSrc:      SpO2: 95% 97% 96% 99%  Weight:       No intake or output data in the 24 hours ending 05/19/21 0925 Filed Weights   05/18/21 2044  Weight: 44.5 kg    Examination:  General exam: Confused.  Dry mucous membranes. Respiratory system: Clear to auscultation anterior lung fields.  No wheezes, no crackles, no rhonchi.Marland Kitchen Respiratory effort normal. Cardiovascular system: Tachycardia. No JVD, murmurs, rubs, gallops or clicks. No pedal edema. Gastrointestinal system: Abdomen is nondistended, soft and some diffuse tenderness to palpation.  Positive bowel sounds.  No rebound.  No guarding.   Central nervous system: Alert and confused.  Moving extremities spontaneously.  Extremities: Symmetric 5 x 5 power. Skin: No rashes, lesions or ulcers Psychiatry: Judgement and insight appear poor.  Mood & affect appropriate.     Data Reviewed: I have personally reviewed following labs and imaging studies  CBC: Recent Labs  Lab 05/18/21 1750 05/19/21 0618  WBC 22.7* 20.9*  NEUTROABS 14.1*  --   HGB 17.3* 14.2  HCT 46.9* 39.7  MCV 91.6 94.5  PLT 92* 82*    Basic Metabolic Panel: Recent Labs  Lab 05/18/21 1750  05/19/21 0618  NA 129* 132*  K 5.0 4.5  CL 93* 98  CO2 28 27  GLUCOSE 132* 112*  BUN 49* 35*  CREATININE 0.81 0.67  CALCIUM 9.8 8.6*  MG 2.3  --     GFR: Estimated Creatinine Clearance: 53.2 mL/min (by C-G formula based on SCr of 0.67 mg/dL).  Liver Function Tests: Recent Labs  Lab 05/18/21 1750 05/19/21 0618  AST 35 27  ALT 31 26  ALKPHOS 102 73  BILITOT 1.4* 0.8  PROT 6.0* 4.5*  ALBUMIN 3.0* 2.2*    CBG: No results for input(s): GLUCAP in the last 168 hours.   Recent Results (from the past 240 hour(s))  Resp Panel by RT-PCR (Flu A&B, Covid) Nasopharyngeal Swab     Status: None   Collection Time: 05/18/21  5:50 PM   Specimen: Nasopharyngeal Swab; Nasopharyngeal(NP) swabs in vial transport medium  Result Value Ref Range Status   SARS Coronavirus 2 by RT PCR NEGATIVE NEGATIVE Final    Comment: (NOTE) SARS-CoV-2 target nucleic acids are NOT DETECTED.  The SARS-CoV-2 RNA is generally detectable in upper respiratory specimens during the acute phase  of infection. The lowest concentration of SARS-CoV-2 viral copies this assay can detect is 138 copies/mL. A negative result does not preclude SARS-Cov-2 infection and should not be used as the sole basis for treatment or other patient management decisions. A negative result may occur with  improper specimen collection/handling, submission of specimen other than nasopharyngeal swab, presence of viral mutation(s) within the areas targeted by this assay, and inadequate number of viral copies(<138 copies/mL). A negative result must be combined with clinical observations, patient history, and epidemiological information. The expected result is Negative.  Fact Sheet for Patients:  EntrepreneurPulse.com.au  Fact Sheet for Healthcare Providers:  IncredibleEmployment.be  This test is no t yet approved or cleared by the [place] FDA and  has been authorized for detection and/or  diagnosis of SARS-CoV-2 by FDA under an Emergency Use Authorization (EUA). This EUA will remain  in effect (meaning this test can be used) for the duration of the COVID-19 declaration under Section 564(b)(1) of the Act, 21 U.S.C.section 360bbb-3(b)(1), unless the authorization is terminated  or revoked sooner.       Influenza A by PCR NEGATIVE NEGATIVE Final   Influenza B by PCR NEGATIVE NEGATIVE Final    Comment: (NOTE) The Xpert Xpress SARS-CoV-2/FLU/RSV plus assay is intended as an aid in the diagnosis of influenza from Nasopharyngeal swab specimens and should not be used as a sole basis for treatment. Nasal washings and aspirates are unacceptable for Xpert Xpress SARS-CoV-2/FLU/RSV testing.  Fact Sheet for Patients: EntrepreneurPulse.com.au  Fact Sheet for Healthcare Providers: IncredibleEmployment.be  This test is not yet approved or cleared by the [place] FDA and has been authorized for detection and/or diagnosis of SARS-CoV-2 by FDA under an Emergency Use Authorization (EUA). This EUA will remain in effect (meaning this test can be used) for the duration of the COVID-19 declaration under Section 564(b)(1) of the Act, 21 U.S.C. section 360bbb-3(b)(1), unless the authorization is terminated or revoked.  Performed at [facility], [employer] [address], [place], [place] [address]          Radiology Studies: DG Chest 1 View  Result Date: 05/18/2021 CLINICAL DATA:  Altered mental status.  Lethargic. EXAM: CHEST  1 VIEW COMPARISON:  None. FINDINGS: The heart size and mediastinal contours are within normal limits. Both lungs are clear. The visualized skeletal structures are unremarkable. IMPRESSION: No active disease. Electronically Signed   By: Nolon Nations M.D.   On: 05/18/2021 17:51   CT HEAD WO CONTRAST  Result Date: 05/18/2021 CLINICAL DATA:  Mental status change. EXAM: CT HEAD WITHOUT CONTRAST TECHNIQUE:  Contiguous axial images were obtained from the base of the skull through the vertex without intravenous contrast. COMPARISON:  None. FINDINGS: Brain: Limited exam. Ventricles and sulci are appropriate for patient's age. No evidence for acute cortically based infarct, intracranial hemorrhage, mass lesion or mass-effect. Vascular: Unremarkable Skull: Intact. Sinuses/Orbits: Paranasal sinuses are well aerated. Mastoid air cells are unremarkable. Other: None. IMPRESSION: No acute intracranial process. Limited exam due to patient positioning. Electronically Signed   By: Lovey Newcomer M.D.   On: 05/18/2021 18:53   CT ABDOMEN PELVIS W CONTRAST  Result Date: 05/18/2021 CLINICAL DATA:  Nausea and vomiting. Colorectal cancer. On chemotherapy. EXAM: CT ABDOMEN AND PELVIS WITH CONTRAST TECHNIQUE: Multidetector CT imaging of the abdomen and pelvis was performed using the standard protocol following bolus administration of intravenous contrast. CONTRAST:  16mL OMNIPAQUE IOHEXOL 350 MG/ML SOLN COMPARISON:  PET-CT 03/04/2021. CT chest abdomen and pelvis 02/08/2021. FINDINGS: Lower chest:  No acute abnormality. Hepatobiliary: Limited evaluation secondary to motion artifact. Previously identified small liver lesions are not well seen on the study. Gallbladder is grossly within normal limits. Pancreas: Unremarkable. No pancreatic ductal dilatation or surrounding inflammatory changes. Spleen: Normal in size without focal abnormality. Adrenals/Urinary Tract: Adrenal glands are unremarkable. Kidneys are normal, without renal calculi, focal lesion, or hydronephrosis. Bladder is unremarkable. Stomach/Bowel: There is wall thickening and edema of the ascending colon. There is no surrounding inflammatory stranding. The appendix is within normal limits. There is also wall thickening and submucosal enhancement of ileal loops diffusely. Jejunal loops and stomach are within normal limits. There is no bowel obstruction, free air or pneumatosis  identified. Chest there is wall thickening at the rectosigmoid junction in the region of previously identified mass. This has decreased when compared to the prior study. Vascular/Lymphatic: There are atherosclerotic calcifications throughout the aorta. There is no evidence for aneurysm. 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 Previously identified enlarged left pelvic lymph node is now normal in size. No definite new enlarged lymph nodes are identified. Reproductive: Uterus and bilateral adnexa are unremarkable. Other: There is a small amount of ascites. There is no focal abdominal wall hernia. Musculoskeletal: No fracture is seen. IMPRESSION: 1. 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 2. Wall thickening of the ascending colon, cecum and ileum worrisome for enterocolitis. Findings may be infectious, inflammatory or ischemic given findings in the aorta. No pneumatosis or free air. 3. Small amount of ascites. 4. Rectosigmoid mass has decreased in size. Left pelvic lymph node has decreased in size. 5. Previously identified liver lesions not well characterized on this study secondary to motion artifact. Electronically Signed   By: Ronney Asters M.D.   On: 05/18/2021 19:57        Scheduled Meds:  nicotine  21 mg Transdermal Daily   Continuous Infusions:  ceFEPime (MAXIPIME) IV Stopped (05/19/21 0106)   heparin 800 Units/hr (05/18/21 2142)   lactated ringers     metronidazole     sodium chloride       LOS: 0 days    Time spent: 35 minutes    Irine Seal, MD [facility]   To contact the attending provider between 7A-7P or the covering provider during after hours 7P-7A, please log into the web site [email or web address] and access using universal [place] password for that web site. If you do not have the password, please call the hospital operator.  05/19/2021, 9:25 AM

## 2021-05-19 NOTE — Progress Notes (Signed)
Angela Mcpherson   DOB:Dec 13, 1960   MR#:[id number]   EGB#:[id number]  Hem/Onc follow up note   Subjective: Patient is well-known to me, under my care for her metastatic colon cancer.  She is on first-line chemotherapy Capox, last dose on 04/24/21.  She presented with worsening diarrhea, fatigue, status change.  She is still drowsy and disoriented, she was able to recognize me, but did not answer other questions or follow commands.she is afebrile, and hemodynamically stable.   Objective:  Vitals:   05/19/21 1400 05/19/21 1450  BP: 112/90 105/81  Pulse: (!) 102 (!) 108  Resp: 10 12  Temp:  (!) 97.5 F (36.4 C)  SpO2: 95%     Body mass index is 18.85 kg/m.  Intake/Output Summary (Last 24 hours) at 05/19/2021 1727 Last data filed at 05/19/2021 1704 Gross per 24 hour  Intake 1529.7 ml  Output --  Net 1529.7 ml     Sclerae unicteric  No peripheral adenopathy  Lungs clear -- no rales or rhonchi  Heart regular rate and rhythm  Abdomen benign  MSK no focal spinal tenderness, no peripheral edema  Neuro nonfocal    CBG (last 3)  No results for input(s): GLUCAP in the last 72 hours.   Labs:   Urine Studies No results for input(s): UHGB, CRYS in the last 72 hours.  Invalid input(s): UACOL, UAPR, USPG, UPH, UTP, UGL, UKET, UBIL, UNIT, UROB, ULEU, UEPI, UWBC, URBC, UBAC, CAST, [place], [place]  Basic Metabolic Panel: Recent Labs  Lab 05/18/21 1750 05/19/21 0618 05/19/21 0921  NA 129* 132*  --   K 5.0 4.5  --   CL 93* 98  --   CO2 28 27  --   GLUCOSE 132* 112*  --   BUN 49* 35*  --   CREATININE 0.81 0.67  --   CALCIUM 9.8 8.6*  --   MG 2.3  --  1.9   GFR Estimated Creatinine Clearance: 59.5 mL/min (by C-G formula based on SCr of 0.67 mg/dL). Liver Function Tests: Recent Labs  Lab 05/18/21 1750 05/19/21 0618  AST 35 27  ALT 31 26  ALKPHOS 102 73  BILITOT 1.4* 0.8  PROT 6.0* 4.5*  ALBUMIN 3.0* 2.2*   No results for input(s): LIPASE, AMYLASE in the last 168 hours. Recent  Labs  Lab 05/18/21 1751  AMMONIA 38*   Coagulation profile Recent Labs  Lab 05/18/21 2044 05/19/21 0618  INR 1.3* 1.5*    CBC: Recent Labs  Lab 05/18/21 1750 05/19/21 0618  WBC 22.7* 20.9*  NEUTROABS 14.1*  --   HGB 17.3* 14.2  HCT 46.9* 39.7  MCV 91.6 94.5  PLT 92* 82*   Cardiac Enzymes: No results for input(s): CKTOTAL, CKMB, CKMBINDEX, TROPONINI in the last 168 hours. BNP: Invalid input(s): POCBNP CBG: No results for input(s): GLUCAP in the last 168 hours. D-Dimer Recent Labs    05/18/21 2044  DDIMER 2.20*   Hgb A1c No results for input(s): HGBA1C in the last 72 hours. Lipid Profile No results for input(s): CHOL, HDL, LDLCALC, TRIG, CHOLHDL, LDLDIRECT in the last 72 hours. Thyroid function studies Recent Labs    05/18/21 2039  TSH 6.354*   Anemia work up No results for input(s): VITAMINB12, FOLATE, FERRITIN, TIBC, IRON, RETICCTPCT in the last 72 hours. Microbiology Recent Results (from the past 240 hour(s))  Resp Panel by RT-PCR (Flu A&B, Covid) Nasopharyngeal Swab     Status: None   Collection Time: 05/18/21  5:50 PM   Specimen: Nasopharyngeal Swab;  Nasopharyngeal(NP) swabs in vial transport medium  Result Value Ref Range Status   SARS Coronavirus 2 by RT PCR NEGATIVE NEGATIVE Final    Comment: (NOTE) SARS-CoV-2 target nucleic acids are NOT DETECTED.  The SARS-CoV-2 RNA is generally detectable in upper respiratory specimens during the acute phase of infection. The lowest concentration of SARS-CoV-2 viral copies this assay can detect is 138 copies/mL. A negative result does not preclude SARS-Cov-2 infection and should not be used as the sole basis for treatment or other patient management decisions. A negative result may occur with  improper specimen collection/handling, submission of specimen other than nasopharyngeal swab, presence of viral mutation(s) within the areas targeted by this assay, and inadequate number of viral copies(<138  copies/mL). A negative result must be combined with clinical observations, patient history, and epidemiological information. The expected result is Negative.  Fact Sheet for Patients:  EntrepreneurPulse.com.au  Fact Sheet for Healthcare Providers:  IncredibleEmployment.be  This test is no t yet approved or cleared by the [place] FDA and  has been authorized for detection and/or diagnosis of SARS-CoV-2 by FDA under an Emergency Use Authorization (EUA). This EUA will remain  in effect (meaning this test can be used) for the duration of the COVID-19 declaration under Section 564(b)(1) of the Act, 21 U.S.C.section 360bbb-3(b)(1), unless the authorization is terminated  or revoked sooner.       Influenza A by PCR NEGATIVE NEGATIVE Final   Influenza B by PCR NEGATIVE NEGATIVE Final    Comment: (NOTE) The Xpert Xpress SARS-CoV-2/FLU/RSV plus assay is intended as an aid in the diagnosis of influenza from Nasopharyngeal swab specimens and should not be used as a sole basis for treatment. Nasal washings and aspirates are unacceptable for Xpert Xpress SARS-CoV-2/FLU/RSV testing.  Fact Sheet for Patients: EntrepreneurPulse.com.au  Fact Sheet for Healthcare Providers: IncredibleEmployment.be  This test is not yet approved or cleared by the [place] FDA and has been authorized for detection and/or diagnosis of SARS-CoV-2 by FDA under an Emergency Use Authorization (EUA). This EUA will remain in effect (meaning this test can be used) for the duration of the COVID-19 declaration under Section 564(b)(1) of the Act, 21 U.S.C. section 360bbb-3(b)(1), unless the authorization is terminated or revoked.  Performed at [facility], [place] [address], [place], [place] [address]       Studies:  DG Chest 1 View  Result Date: 05/18/2021 CLINICAL DATA:  Altered mental status.  Lethargic. EXAM:  CHEST  1 VIEW COMPARISON:  None. FINDINGS: The heart size and mediastinal contours are within normal limits. Both lungs are clear. The visualized skeletal structures are unremarkable. IMPRESSION: No active disease. Electronically Signed   By: Nolon Nations M.D.   On: 05/18/2021 17:51   CT HEAD WO CONTRAST  Result Date: 05/18/2021 CLINICAL DATA:  Mental status change. EXAM: CT HEAD WITHOUT CONTRAST TECHNIQUE: Contiguous axial images were obtained from the base of the skull through the vertex without intravenous contrast. COMPARISON:  None. FINDINGS: Brain: Limited exam. Ventricles and sulci are appropriate for patient's age. No evidence for acute cortically based infarct, intracranial hemorrhage, mass lesion or mass-effect. Vascular: Unremarkable Skull: Intact. Sinuses/Orbits: Paranasal sinuses are well aerated. Mastoid air cells are unremarkable. Other: None. IMPRESSION: No acute intracranial process. Limited exam due to patient positioning. Electronically Signed   By: Lovey Newcomer M.D.   On: 05/18/2021 18:53   CT ABDOMEN PELVIS W CONTRAST  Result Date: 05/18/2021 CLINICAL DATA:  Nausea and vomiting. Colorectal cancer. On chemotherapy. EXAM: CT  ABDOMEN AND PELVIS WITH CONTRAST TECHNIQUE: Multidetector CT imaging of the abdomen and pelvis was performed using the standard protocol following bolus administration of intravenous contrast. CONTRAST:  82mL OMNIPAQUE IOHEXOL 350 MG/ML SOLN COMPARISON:  PET-CT 03/04/2021. CT chest abdomen and pelvis 02/08/2021. FINDINGS: Lower chest: No acute abnormality. Hepatobiliary: Limited evaluation secondary to motion artifact. Previously identified small liver lesions are not well seen on the study. Gallbladder is grossly within normal limits. Pancreas: Unremarkable. No pancreatic ductal dilatation or surrounding inflammatory changes. Spleen: Normal in size without focal abnormality. Adrenals/Urinary Tract: Adrenal glands are unremarkable. Kidneys are normal, without renal  calculi, focal lesion, or hydronephrosis. Bladder is unremarkable. Stomach/Bowel: There is wall thickening and edema of the ascending colon. There is no surrounding inflammatory stranding. The appendix is within normal limits. There is also wall thickening and submucosal enhancement of ileal loops diffusely. Jejunal loops and stomach are within normal limits. There is no bowel obstruction, free air or pneumatosis identified. Chest there is wall thickening at the rectosigmoid junction in the region of previously identified mass. This has decreased when compared to the prior study. Vascular/Lymphatic: There are atherosclerotic calcifications throughout the aorta. There is no evidence for aneurysm. 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 Previously identified enlarged left pelvic lymph node is now normal in size. No definite new enlarged lymph nodes are identified. Reproductive: Uterus and bilateral adnexa are unremarkable. Other: There is a small amount of ascites. There is no focal abdominal wall hernia. Musculoskeletal: No fracture is seen. IMPRESSION: 1. 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 2. Wall thickening of the ascending colon, cecum and ileum worrisome for enterocolitis. Findings may be infectious, inflammatory or ischemic given findings in the aorta. No pneumatosis or free air. 3. Small amount of ascites. 4. Rectosigmoid mass has decreased in size. Left pelvic lymph node has decreased in size. 5. Previously identified liver lesions not well characterized on this study secondary to motion artifact. Electronically Signed   By: Ronney Asters M.D.   On: 05/18/2021 19:57    Assessment: 60 y.o.  Acute encephalopathy, likely metabolic Colitis, GI panel is pending  dehydration from colitis and chemo  Abdominal aorta mural thrombosis, on A/C  Stage IV colon cancer, on first line chemo CAPOX and beva   Thrombocytopenia secondary to chemo   Plan:  -Agree with hydration and empiric while waiting for stool and culture results -continue supportive care  -will hold chemo until she recovers well, and may not give beva in future due to her thrombosis  -I spoke with her husband  -I will f/u as needed    Truitt Merle, MD 05/19/2021  5:27 PM

## 2021-05-19 NOTE — Progress Notes (Signed)
[facility] for Heparin  Indication: Aortic Thrombus  Allergies  Allergen Reactions   Codeine    Epinephrine (Anaphylaxis)    Orange Fruit [Citrus] Other (See Comments)    Blisters on face.   Streptogramins    Tomato Other (See Comments)    Blisters on face    Patient Measurements: Weight: 44.5 kg (98 lb) Heparin Dosing Weight: recent oncology office visit weight ~ 50 kg  44.5 kg per ED  Vital Signs: BP: 102/78 (10/02 0351) Pulse Rate: 91 (10/02 0351)  Labs: Recent Labs    05/18/21 1750 05/18/21 2044 05/19/21 0035 05/19/21 0618 05/19/21 0619  HGB 17.3*  --   --  14.2  --   HCT 46.9*  --   --  39.7  --   PLT 92*  --   --  82*  --   APTT  --  24  --   --   --   LABPROT  --  16.6*  --  18.1*  --   INR  --  1.3*  --  1.5*  --   HEPARINUNFRC  --   --   --   --  0.65  CREATININE 0.81  --   --   --   --   TROPONINIHS  --  7 9  --   --      Estimated Creatinine Clearance: 52.5 mL/min (by C-G formula based on SCr of 0.81 mg/dL).   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 Pharmacy consulted to initiate heparin infusion for aortic thrombus per CT abdomen/pelvis.   05/19/21 06:19 Heparin level = 0.65 (therapeutic) with heparin gtt @ 800 units/hr Hgb 14.2  Pltc 40G No complications of therapy noted  Goal of Therapy:  Heparin level 0.3-0.7 units/ml Monitor platelets by anticoagulation protocol: Yes   Plan:  Continue IV heparin gtt @ 800 units/hr Recheck heparin level in 6 hr to confirm therapeutic dose Daily heparin level & CBC  Shealeigh Dunstan, Toribio Harbour, PharmD 05/19/2021,6:53 AM

## 2021-05-19 NOTE — Progress Notes (Signed)
[facility] for Heparin  Indication: Aortic Thrombus  Allergies  Allergen Reactions   Codeine    Epinephrine (Anaphylaxis)    Orange Fruit [Citrus] Other (See Comments)    Blisters on face.   Streptogramins    Tomato Other (See Comments)    Blisters on face    Patient Measurements: Weight: 44.5 kg (98 lb) Heparin Dosing Weight: recent oncology office visit weight ~ 50 kg  44.5 kg per ED  Vital Signs: BP: 105/84 (10/02 1101) Pulse Rate: 108 (10/02 1101)  Labs: Recent Labs    05/18/21 1750 05/18/21 2044 05/19/21 0035 05/19/21 0618 05/19/21 0619 05/19/21 1155  HGB 17.3*  --   --  14.2  --   --   HCT 46.9*  --   --  39.7  --   --   PLT 92*  --   --  82*  --   --   APTT  --  24  --   --   --   --   LABPROT  --  16.6*  --  18.1*  --   --   INR  --  1.3*  --  1.5*  --   --   HEPARINUNFRC  --   --   --   --  0.65 0.67  CREATININE 0.81  --   --  0.67  --   --   TROPONINIHS  --  7 9  --   --   --      Estimated Creatinine Clearance: 53.2 mL/min (by C-G formula based on SCr of 0.67 mg/dL).   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 Pharmacy consulted to initiate heparin infusion for aortic thrombus per CT abdomen/pelvis. Vascular surgery recommending continued anticoagulation to further propagation or embolization of thrombus.   05/19/21 Repeat HL therapeutic at 0.67 Hgb 14.2  Pltc 82K No bleeding or issues with heparin infusion per RN   Goal of Therapy:  Heparin level 0.3-0.7 units/ml Monitor platelets by anticoagulation protocol: Yes   Plan:  Continue IV heparin gtt @ 800 units/hr Daily  heparin level & CBC F/U plans for anticoagulation  Napoleon Form, PharmD 05/19/2021,1:04 PM

## 2021-05-20 DIAGNOSIS — E876 Hypokalemia: Secondary | ICD-10-CM

## 2021-05-20 DIAGNOSIS — I7410 Embolism and thrombosis of unspecified parts of aorta: Secondary | ICD-10-CM | POA: Diagnosis not present

## 2021-05-20 DIAGNOSIS — K529 Noninfective gastroenteritis and colitis, unspecified: Secondary | ICD-10-CM | POA: Diagnosis not present

## 2021-05-20 DIAGNOSIS — G9340 Encephalopathy, unspecified: Secondary | ICD-10-CM | POA: Diagnosis not present

## 2021-05-20 DIAGNOSIS — E43 Unspecified severe protein-calorie malnutrition: Secondary | ICD-10-CM | POA: Insufficient documentation

## 2021-05-20 DIAGNOSIS — E860 Dehydration: Secondary | ICD-10-CM | POA: Diagnosis not present

## 2021-05-20 LAB — BASIC METABOLIC PANEL
Anion gap: 6 (ref 5–15)
BUN: 34 mg/dL — ABNORMAL HIGH (ref 6–20)
CO2: 22 mmol/L (ref 22–32)
Calcium: 8 mg/dL — ABNORMAL LOW (ref 8.9–10.3)
Chloride: 102 mmol/L (ref 98–111)
Creatinine, Ser: 0.61 mg/dL (ref 0.44–1.00)
GFR, Estimated: >60 mL/min (ref >60)
Glucose, Bld: 141 mg/dL — ABNORMAL HIGH (ref 70–99)
Potassium: 3.5 mmol/L (ref 3.5–5.1)
Sodium: 130 mmol/L — ABNORMAL LOW (ref 135–145)

## 2021-05-20 LAB — GASTROINTESTINAL PANEL BY PCR, STOOL (REPLACES STOOL CULTURE)

## 2021-05-20 LAB — URINE CULTURE: Culture: NO GROWTH

## 2021-05-20 LAB — CBC
HCT: 43.5 % (ref 36.0–46.0)
Hemoglobin: 15.6 g/dL — ABNORMAL HIGH (ref 12.0–15.0)
MCH: 33.9 pg (ref 26.0–34.0)
MCHC: 35.9 g/dL (ref 30.0–36.0)
MCV: 94.6 fL (ref 80.0–100.0)
Platelets: 43 10*3/uL — ABNORMAL LOW (ref 150–400)
RBC: 4.6 MIL/uL (ref 3.87–5.11)
RDW: 20.1 % — ABNORMAL HIGH (ref 11.5–15.5)
WBC: 26.9 10*3/uL — ABNORMAL HIGH (ref 4.0–10.5)
nRBC: 0 % (ref 0.0–0.2)

## 2021-05-20 LAB — C DIFFICILE QUICK SCREEN W PCR REFLEX
C Diff antigen: NONREACTIVE — AB
C Diff toxin: NONREACTIVE — AB

## 2021-05-20 LAB — HEPARIN LEVEL (UNFRACTIONATED): Heparin Unfractionated: 0.66 IU/mL (ref 0.30–0.70)

## 2021-05-20 LAB — MAGNESIUM: Magnesium: 1.7 mg/dL (ref 1.7–2.4)

## 2021-05-20 MED ORDER — LOPERAMIDE HCL 2 MG PO CAPS
2.0000 mg | ORAL_CAPSULE | ORAL | Status: DC | PRN
  Administered 2021-05-20 – 2021-05-27 (×11): 2 mg via ORAL
  Filled 2021-05-20 (×12): qty 1

## 2021-05-20 MED ORDER — MAGNESIUM SULFATE 2 GM/50ML IV SOLN
2.0000 g | Freq: Once | INTRAVENOUS | Status: AC
  Administered 2021-05-20: 2 g via INTRAVENOUS
  Filled 2021-05-20: qty 50

## 2021-05-20 MED ORDER — BOOST / RESOURCE BREEZE PO LIQD CUSTOM
1.0000 | Freq: Four times a day (QID) | ORAL | Status: DC
  Administered 2021-05-20 – 2021-05-27 (×20): 1 via ORAL

## 2021-05-20 MED ORDER — ALUM & MAG HYDROXIDE-SIMETH 200-200-20 MG/5ML PO SUSP
30.0000 mL | Freq: Once | ORAL | Status: AC
  Administered 2021-05-20: 30 mL via ORAL
  Filled 2021-05-20: qty 30

## 2021-05-20 MED ORDER — LIP MEDEX EX OINT
TOPICAL_OINTMENT | CUTANEOUS | Status: AC
  Filled 2021-05-20: qty 7

## 2021-05-20 MED ORDER — LIDOCAINE VISCOUS HCL 2 % MT SOLN
15.0000 mL | Freq: Once | OROMUCOSAL | Status: AC
  Administered 2021-05-20: 15 mL via ORAL
  Filled 2021-05-20: qty 15

## 2021-05-20 MED ORDER — ADULT MULTIVITAMIN W/MINERALS CH
1.0000 | ORAL_TABLET | Freq: Every day | ORAL | Status: DC
  Administered 2021-05-20 – 2021-05-24 (×4): 1 via ORAL
  Filled 2021-05-20 (×8): qty 1

## 2021-05-20 MED ORDER — PANTOPRAZOLE SODIUM 40 MG PO TBEC
40.0000 mg | DELAYED_RELEASE_TABLET | Freq: Every day | ORAL | Status: DC
Start: 2021-05-20 — End: 2021-05-27
  Administered 2021-05-20 – 2021-05-27 (×8): 40 mg via ORAL
  Filled 2021-05-20 (×8): qty 1

## 2021-05-20 MED ORDER — SODIUM CHLORIDE 0.9 % IV SOLN
2.0000 g | Freq: Three times a day (TID) | INTRAVENOUS | Status: DC
  Administered 2021-05-20 – 2021-05-22 (×7): 2 g via INTRAVENOUS
  Filled 2021-05-20 (×11): qty 2

## 2021-05-20 NOTE — Progress Notes (Signed)
Date and time results received: 05/20/21 1057   Test: GI Panel Critical Value: positive Norovirus GI/GII  Name of Provider Notified: Dr. Grandville Silos  Orders Received? Or Actions Taken?: Yes, see orders

## 2021-05-20 NOTE — Progress Notes (Signed)
Pharmacy Antibiotic Note  Angela Mcpherson is a 60 y.o. female admitted on 05/18/2021 with intra-abdominal infection.  Pharmacy has been consulted for Cefepime dosing. 05/20/2021 D#3 cefepime/flagyl WBC up to 26.9; SCr 0.61 Tmax 98.9  Plan: Increase Cefepime to 2 gm q8h Flagyl 500mg  IV q12h Follow renal function F/u culture results  Height: 5\' 4"  (162.6 cm) Weight: 49.8 kg (109 lb 12.6 oz) IBW/kg (Calculated) : 54.7  Temp (24hrs), Avg:98.1 F (36.7 C), Min:97.5 F (36.4 C), Max:98.9 F (37.2 C)  Recent Labs  Lab 05/18/21 1750 05/18/21 1903 05/18/21 2039 05/19/21 0007 05/19/21 0251 05/19/21 0618 05/20/21 0400  WBC 22.7*  --   --   --   --  20.9* 26.9*  CREATININE 0.81  --   --   --   --  0.67 0.61  LATICACIDVEN  --  2.1* 2.8* 3.2* 1.9  --   --      Estimated Creatinine Clearance: 59.5 mL/min (by C-G formula based on SCr of 0.61 mg/dL).    Allergies  Allergen Reactions   Codeine    Epinephrine (Anaphylaxis)    Orange Fruit [Citrus] Other (See Comments)    Blisters on face.   Streptogramins    Tomato Other (See Comments)    Blisters on face   Antimicrobials this admission: 10/1 Ceftriaxone x 1 10/1 Metronidazole >>   10/2 Cefepime >>  Dose adjustments this admission:  10/2 incr cefepime 2 g q8  Microbiology results: 10/1 BCx2:  ngtd 10/1 UCx: ngF 10/2 HIV NR C diff: neg GI PCR: sent Thank you for allowing pharmacy to be a part of this patient's care.  Eudelia Bunch, Pharm.D 05/20/2021 9:21 AM

## 2021-05-20 NOTE — Progress Notes (Addendum)
PROGRESS NOTE    Angela Mcpherson  IRS:[id number] DOB: 01-Feb-1961 DOA: 05/18/2021 PCP: Angela Merle, MD    No chief complaint on file.   Brief Narrative:  HPI per Dr. Theodore Demark is a 60 y.o. female with medical history significant of stage 4 rectal cancer.  Pt presents to ED for AMS.   Pt with generalized weakness, watery diarrhea for past week.  AMS over past couple of days.   She was last normal after her last chemotherapy treatment on Tuesday, 05/14/2021.  She is reportedly now nonverbal and has been significantly lethargic per family members at home and husband at bedside in ED.   Per husband, she has not been at her baseline since this past Tuesday. Progressively worsening. Hasn't been getting out of the couch. Probably spent all night covered in her own stool. Worsening mental status. Not eating or drinking. Any attempts at eating, she has been vomiting. No emesis the past two days.   Pt unable to contribute to history taking due to AMS.     ED Course: Pt with leukocytosis, labs also look significantly dehydrated.   CT scan reveals mural thrombus of decending aorta, severe stenosis but non occlusive.   Pt does have B DP pulses.    Assessment & Plan:   Principal Problem:   Acute encephalopathy Active Problems:   Malignant neoplasm of rectosigmoid junction (HCC)   Colitis   Dehydration   Aortic mural thrombus (HCC)   Thrombocytopenia (HCC)   Protein-calorie malnutrition, severe   1 acute metabolic encephalopathy -Likely multifactorial secondary to acute colitis in the setting of dehydration. -Ammonia level slightly elevated at 38. -CT head negative for any acute abnormalities. -Blood cultures pending, C. difficile PCR and GI pathogen panel positive for norovirus.   -Urine cultures pending.   -Improving clinically with hydration, supportive care, empiric IV antibiotics..   -Continue empiric IV antibiotics until blood cultures are negative for at least 3 days,  patient remaining afebrile, improvement with leukocytosis then subsequently discontinue IV antibiotics.   -Supportive care.   2.  Colitis/norovirus gastroenteritis -CT scan of abdomen and pelvis with wall thickening of the ascending colon, cecum, ileum concerning for enterocolitis. -Patient noted to have had watery loose stools prior to admission. -C. difficile PCR, GI pathogen panel positive for norovirus.   -Blood cultures pending. -Continue empiric IV cefepime, IV Flagyl. -IV fluids. -If blood cultures remain negative tomorrow and leukocytosis improving and patient remains afebrile could likely discontinue IV antibiotics and follow. -Supportive care.  3.  Dehydration -Likely secondary to GI losses. -Improved with hydration.  -Supportive care.  4.  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 -Chart reviewed and scans reviewed by vascular surgeon, Dr.Brabham who noted that patient does have palpable pedal pulses, no vascular intervention recommended at this time and her current condition with preservation of palpable pedal pulses and no evidence of embolic disease is felt patient does not require open surgery or percutaneous stenting. -Per vascular surgery recommending anticoagulation and patient started on IV heparin and will likely need to be transition to oral anticoagulation however will defer choice and timing of oral anticoagulation to hematology/oncology.   -Supportive care.    5.  Thrombocytopenia -Felt secondary to consumption of platelets given large aortic mural thrombus. -Patient with no bleeding. -Platelet count back up currently at 63,000.   -On heparin.   -Follow.  6.  Leukocytosis -Patient pancultured. -C. difficile PCR negative., GI pathogen panel positive  for norovirus.   -Patient on empiric IV antibiotics which we will continue and if blood cultures remain negative tomorrow with improvement in  leukocytosis could likely discontinue IV antibiotics.   -Supportive care.   5.  Metastatic stage IV colon cancer -Patient on first-line chemotherapy of CAPOX in beva. -Patient seen by oncology who are recommending to hold chemotherapy until patient recovers from acute illness and may not receive beva in the future due to thrombosis. -Chemotherapy on hold.   -Oncology following.   -Supportive care.  6.  Hypokalemia -Secondary to GI losses. -Replete.  7.  Sinus tachycardia -Likely secondary to dehydration, colitis, acute illness. -TSH obtained slightly elevated with normal free T4. -Continue IV fluids. -Supportive care. -Lopressor as needed.   DVT prophylaxis: Heparin Code Status: Full Family Communication: No family at bedside. Disposition:   Status is: Inpatient  The patient will require care spanning > 2 midnights and should be moved to inpatient because: Inpatient level of care appropriate due to severity of illness  Dispo: The patient is from: Home              Anticipated d/c is to:  TBD              Patient currently is not medically stable to d/c.   Difficult to place patient No       Consultants:  Vascular surgery: Dr. Trula Slade Oncology: Dr. Burr Medico  Procedures:  CT abdomen pelvis 05/18/2021 CT head 05/18/2021 Chest x-ray 05/18/2021  Antimicrobials:  IV cefepime 05/19/2021>>>> Rocephin 05/18/2021 x1 dose IV Flagyl 05/19/2021>>>>>> 05/26/2021    Subjective: Patient laying in bed.  More alert today.  Denies any chest pain.  No shortness of breath.  Denies any significant abdominal pain.  Unable to tell me whether she is still having diarrhea.  Tolerating current diet.  Overall feeling better than she did on admission.  Objective: Vitals:   05/19/21 1750 05/19/21 2232 05/20/21 0437 05/20/21 1406  BP: 116/90 96/70 112/71 110/81  Pulse: (!) 112 (!) 109 (!) 102   Resp: 14 18 16 18   Temp: 98.3 F (36.8 C) 98.9 F (37.2 C) 98.2 F (36.8 C) 98.6 F (37 C)   TempSrc: Oral   Oral  SpO2: 100% 96% 96% 95%  Weight:      Height:        Intake/Output Summary (Last 24 hours) at 05/20/2021 1655 Last data filed at 05/20/2021 0842 Gross per 24 hour  Intake 4675.33 ml  Output --  Net 4675.33 ml   Filed Weights   05/18/21 2044 05/19/21 1450  Weight: 44.5 kg 49.8 kg    Examination:  General exam: Alert. Respiratory system: Lungs clear to auscultation bilaterally, no wheezes, no crackles, no rhonchi.  Normal respiratory effort.  Cardiovascular system: Tachycardia.  No murmurs rubs or gallops.  No JVD.  No lower extremity edema.  Gastrointestinal system: Abdomen is soft, mildly distended, nontender to palpation, positive bowel sounds.  No rebound.  No guarding. Central nervous system: Alert.  Less confused.  Moving extremities spontaneously.    Extremities: Symmetric 5 x 5 power. Skin: No rashes, lesions or ulcers Psychiatry: Judgement and insight appear poor.  Mood & affect appropriate.     Data Reviewed: I have personally reviewed following labs and imaging studies  CBC: Recent Labs  Lab 05/18/21 1750 05/19/21 0618 05/20/21 0400  WBC 22.7* 20.9* 26.9*  NEUTROABS 14.1*  --   --   HGB 17.3* 14.2 15.6*  HCT 46.9* 39.7 43.5  MCV 91.6  94.5 94.6  PLT 92* 82* 43*     Basic Metabolic Panel: Recent Labs  Lab 05/18/21 1750 05/19/21 0618 05/19/21 0921 05/20/21 0400  NA 129* 132*  --  130*  K 5.0 4.5  --  3.5  CL 93* 98  --  102  CO2 28 27  --  22  GLUCOSE 132* 112*  --  141*  BUN 49* 35*  --  34*  CREATININE 0.81 0.67  --  0.61  CALCIUM 9.8 8.6*  --  8.0*  MG 2.3  --  1.9 1.7     GFR: Estimated Creatinine Clearance: 59.5 mL/min (by C-G formula based on SCr of 0.61 mg/dL).  Liver Function Tests: Recent Labs  Lab 05/18/21 1750 05/19/21 0618  AST 35 27  ALT 31 26  ALKPHOS 102 73  BILITOT 1.4* 0.8  PROT 6.0* 4.5*  ALBUMIN 3.0* 2.2*     CBG: No results for input(s): GLUCAP in the last 168 hours.   Recent Results  (from the past 240 hour(s))  Resp Panel by RT-PCR (Flu A&B, Covid) Nasopharyngeal Swab     Status: None   Collection Time: 05/18/21  5:50 PM   Specimen: Nasopharyngeal Swab; Nasopharyngeal(NP) swabs in vial transport medium  Result Value Ref Range Status   SARS Coronavirus 2 by RT PCR NEGATIVE NEGATIVE Final    Comment: (NOTE) SARS-CoV-2 target nucleic acids are NOT DETECTED.  The SARS-CoV-2 RNA is generally detectable in upper respiratory specimens during the acute phase of infection. The lowest concentration of SARS-CoV-2 viral copies this assay can detect is 138 copies/mL. A negative result does not preclude SARS-Cov-2 infection and should not be used as the sole basis for treatment or other patient management decisions. A negative result may occur with  improper specimen collection/handling, submission of specimen other than nasopharyngeal swab, presence of viral mutation(s) within the areas targeted by this assay, and inadequate number of viral copies(<138 copies/mL). A negative result must be combined with clinical observations, patient history, and epidemiological information. The expected result is Negative.  Fact Sheet for Patients:  EntrepreneurPulse.com.au  Fact Sheet for Healthcare Providers:  IncredibleEmployment.be  This test is no t yet approved or cleared by the [place] FDA and  has been authorized for detection and/or diagnosis of SARS-CoV-2 by FDA under an Emergency Use Authorization (EUA). This EUA will remain  in effect (meaning this test can be used) for the duration of the COVID-19 declaration under Section 564(b)(1) of the Act, 21 U.S.C.section 360bbb-3(b)(1), unless the authorization is terminated  or revoked sooner.       Influenza A by PCR NEGATIVE NEGATIVE Final   Influenza B by PCR NEGATIVE NEGATIVE Final    Comment: (NOTE) The Xpert Xpress SARS-CoV-2/FLU/RSV plus assay is intended as an aid in the  diagnosis of influenza from Nasopharyngeal swab specimens and should not be used as a sole basis for treatment. Nasal washings and aspirates are unacceptable for Xpert Xpress SARS-CoV-2/FLU/RSV testing.  Fact Sheet for Patients: EntrepreneurPulse.com.au  Fact Sheet for Healthcare Providers: IncredibleEmployment.be  This test is not yet approved or cleared by the [place] FDA and has been authorized for detection and/or diagnosis of SARS-CoV-2 by FDA under an Emergency Use Authorization (EUA). This EUA will remain in effect (meaning this test can be used) for the duration of the COVID-19 declaration under Section 564(b)(1) of the Act, 21 U.S.C. section 360bbb-3(b)(1), unless the authorization is terminated or revoked.  Performed at [facility], [address]  [place], [place] [address]   Blood culture (routine x 2)     Status: None (Preliminary result)   Collection Time: 05/18/21  7:02 PM   Specimen: BLOOD  Result Value Ref Range Status   Specimen Description   Final    BLOOD LEFT ANTECUBITAL Performed at [facility] [address], [place], [place] [address]    Special Requests   Final    BOTTLES DRAWN AEROBIC AND ANAEROBIC Blood Culture adequate volume Performed at [facility] [address] [place], [place] [address]    Culture   Final    NO GROWTH 1 DAY Performed at [facility], [address] [place], [place] [address]    Report Status PENDING  Incomplete  Blood culture (routine x 2)     Status: None (Preliminary result)   Collection Time: 05/18/21  7:08 PM   Specimen: BLOOD  Result Value Ref Range Status   Specimen Description   Final    BLOOD RIGHT ANTECUBITAL Performed at [facility] [address], [place], [place] [address]    Special Requests   Final    BOTTLES DRAWN AEROBIC AND ANAEROBIC Blood Culture adequate volume Performed  at [facility] [address], [place], [place] [address]    Culture   Final    NO GROWTH 1 DAY Performed at [facility], [place] [address], [place], [place] [address]    Report Status PENDING  Incomplete  Urine Culture     Status: None   Collection Time: 05/18/21  9:01 PM   Specimen: Urine, Catheterized  Result Value Ref Range Status   Specimen Description   Final    URINE, CATHETERIZED Performed at  [address] [place], [place] [address]    Special Requests   Final    NONE Performed at [facility], [place] [address], [place], [place] [address]    Culture   Final    NO GROWTH Performed at [facility],  [address] [place], [place] [address]    Report Status 05/20/2021 FINAL  Final  Gastrointestinal Panel by PCR , Stool     Status: Abnormal   Collection Time: 05/19/21 10:54 PM   Specimen: Stool  Result Value Ref Range Status   Campylobacter species NOT DETECTED NOT DETECTED Final   Plesimonas shigelloides NOT DETECTED NOT DETECTED Final   Salmonella species NOT DETECTED NOT DETECTED Final   Yersinia enterocolitica NOT DETECTED NOT DETECTED Final   Vibrio species NOT DETECTED NOT DETECTED Final   Vibrio cholerae NOT DETECTED NOT DETECTED Final   Enteroaggregative E coli (EAEC) NOT DETECTED NOT DETECTED Final   Enteropathogenic E coli (EPEC) NOT DETECTED NOT DETECTED Final   Enterotoxigenic E coli (ETEC) NOT DETECTED NOT DETECTED Final   Shiga like toxin producing E coli (STEC) NOT DETECTED NOT DETECTED Final   Shigella/Enteroinvasive E coli (EIEC) NOT DETECTED NOT DETECTED Final   Cryptosporidium NOT DETECTED NOT DETECTED Final   Cyclospora cayetanensis NOT DETECTED NOT DETECTED Final   Entamoeba histolytica NOT DETECTED NOT DETECTED Final   Giardia lamblia NOT DETECTED NOT DETECTED Final   Adenovirus F40/41 NOT DETECTED NOT DETECTED Final   Astrovirus NOT DETECTED NOT DETECTED Final    Norovirus GI/GII DETECTED (A) NOT DETECTED Final    Comment: RESULT CALLED TO, READ BACK BY AND VERIFIED WITH: C/REBECCA PULEO 05/20/21 1052 AMK    Rotavirus A NOT DETECTED NOT DETECTED  Final   Sapovirus (I, II, IV, and V) NOT DETECTED NOT DETECTED Final    Comment: Performed at [facility], [place], [place] [address]  C Difficile Quick Screen w PCR reflex     Status: Abnormal   Collection Time: 05/19/21 10:54 PM   Specimen: Stool  Result Value Ref Range Status   C Diff antigen NON REACTIVE (A) NEGATIVE Final   C Diff toxin NON REACTIVE (A) NEGATIVE Final   C Diff interpretation VALID  Final    Comment: Performed at [facility], [place] [address], [place], [place] [address]          Radiology Studies: DG Chest 1 View  Result Date: 05/18/2021 CLINICAL DATA:  Altered mental status.  Lethargic. EXAM: CHEST  1 VIEW COMPARISON:  None. FINDINGS: The heart size and mediastinal contours are within normal limits. Both lungs are clear. The visualized skeletal structures are unremarkable. IMPRESSION: No active disease. Electronically Signed   By: Nolon Nations M.D.   On: 05/18/2021 17:51   CT HEAD WO CONTRAST  Result Date: 05/18/2021 CLINICAL DATA:  Mental status change. EXAM: CT HEAD WITHOUT CONTRAST TECHNIQUE: Contiguous axial images were obtained from the base of the skull through the vertex without intravenous contrast. COMPARISON:  None. FINDINGS: Brain: Limited exam. Ventricles and sulci are appropriate for patient's age. No evidence for acute cortically based infarct, intracranial hemorrhage, mass lesion or mass-effect. Vascular: Unremarkable Skull: Intact. Sinuses/Orbits: Paranasal sinuses are well aerated. Mastoid air cells are unremarkable. Other: None. IMPRESSION: No acute intracranial process. Limited exam due to patient positioning. Electronically Signed   By: Lovey Newcomer M.D.   On: 05/18/2021 18:53   CT ABDOMEN PELVIS W  CONTRAST  Result Date: 05/18/2021 CLINICAL DATA:  Nausea and vomiting. Colorectal cancer. On chemotherapy. EXAM: CT ABDOMEN AND PELVIS WITH CONTRAST TECHNIQUE: Multidetector CT imaging of the abdomen and pelvis was performed using the standard protocol following bolus administration of intravenous contrast. CONTRAST:  7mL OMNIPAQUE IOHEXOL 350 MG/ML SOLN COMPARISON:  PET-CT 03/04/2021. CT chest abdomen and pelvis 02/08/2021. FINDINGS: Lower chest: No acute abnormality. Hepatobiliary: Limited evaluation secondary to motion artifact. Previously identified small liver lesions are not well seen on the study. Gallbladder is grossly within normal limits. Pancreas: Unremarkable. No pancreatic ductal dilatation or surrounding inflammatory changes. Spleen: Normal in size without focal abnormality. Adrenals/Urinary Tract: Adrenal glands are unremarkable. Kidneys are normal, without renal calculi, focal lesion, or hydronephrosis. Bladder is unremarkable. Stomach/Bowel: There is wall thickening and edema of the ascending colon. There is no surrounding inflammatory stranding. The appendix is within normal limits. There is also wall thickening and submucosal enhancement of ileal loops diffusely. Jejunal loops and stomach are within normal limits. There is no bowel obstruction, free air or pneumatosis identified. Chest there is wall thickening at the rectosigmoid junction in the region of previously identified mass. This has decreased when compared to the prior study. Vascular/Lymphatic: There are atherosclerotic calcifications throughout the aorta. There is no evidence for aneurysm. 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 Previously identified enlarged left pelvic lymph node is now normal in size. No definite new enlarged lymph nodes are identified. Reproductive: Uterus and bilateral adnexa are unremarkable. Other: There is a small amount of ascites. There is no focal  abdominal wall hernia. Musculoskeletal: No fracture is seen. IMPRESSION: 1. 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 2. Wall thickening of the ascending colon, cecum  and ileum worrisome for enterocolitis. Findings may be infectious, inflammatory or ischemic given findings in the aorta. No pneumatosis or free air. 3. Small amount of ascites. 4. Rectosigmoid mass has decreased in size. Left pelvic lymph node has decreased in size. 5. Previously identified liver lesions not well characterized on this study secondary to motion artifact. Electronically Signed   By: Ronney Asters M.D.   On: 05/18/2021 19:57        Scheduled Meds:  feeding supplement  1 Container Oral QID   multivitamin with minerals  1 tablet Oral Daily   nicotine  21 mg Transdermal Daily   pantoprazole  40 mg Oral Q0600   Continuous Infusions:  ceFEPime (MAXIPIME) IV 2 g (05/20/21 1050)   heparin 800 Units/hr (05/20/21 0842)   lactated ringers 125 mL/hr at 05/20/21 0842   metronidazole Stopped (05/20/21 1035)     LOS: 1 day    Time spent: 35 minutes    Irine Seal, MD [facility]   To contact the attending provider between 7A-7P or the covering provider during after hours 7P-7A, please log into the web site [email or web address] and access using universal [place] password for that web site. If you do not have the password, please call the hospital operator.  05/20/2021, 4:55 PM

## 2021-05-20 NOTE — Progress Notes (Signed)
Initial Nutrition Assessment  DOCUMENTATION CODES:  Severe malnutrition in context of chronic illness  INTERVENTION:  Continue to advance diet as medically able and as tolerated.  Add Boost Breeze po QID, each supplement provides 250 kcal and 9 grams of protein.  Add MVI with minerals daily.  Encourage PO intake.  NUTRITION DIAGNOSIS:  Severe Malnutrition related to chronic illness, cancer and cancer related treatments as evidenced by percent weight loss, severe fat depletion, severe muscle depletion.  GOAL:  Patient will meet greater than or equal to 90% of their needs  MONITOR:  Diet advancement, PO intake, Supplement acceptance, Labs, Weight trends, Skin, I & O's  REASON FOR ASSESSMENT:  Malnutrition Screening Tool    ASSESSMENT:  60 yo female with a PMH of stage 4 rectal cancer. Pt with generalized weakness, watery diarrhea for past week. Last chemo on 05/14/2021. Admitted with AMS.  Pt very confused during RD visit. Tried to explain the importance of eating well and having protein. Pt only interested in drinking Boost Breeze.  Husband tried to also explain RD's concern for protein intake. Pt confused and still asked for Boost Breeze.  Husband explained pt used to weigh 135 lbs around 1 year ago, but pt weighs 109 lbs now.  Per Epic, pt has lost ~15 lbs (12%) in the past 6 months, which is significant and severe for the time frame.  Medications: reviewed; Protonix, LR @ 125 ml/hr, Imodium PO PRN (given once today)   Labs: reviewed; Na 130 (L), Glucose 141 (H)  NUTRITION - FOCUSED PHYSICAL EXAM: Flowsheet Row Most Recent Value  Orbital Region Severe depletion  Upper Arm Region Severe depletion  Thoracic and Lumbar Region Severe depletion  Buccal Region Severe depletion  Temple Region Severe depletion  Clavicle Bone Region Severe depletion  Clavicle and Acromion Bone Region Severe depletion  Scapular Bone Region Severe depletion  Dorsal Hand Severe depletion   Patellar Region Severe depletion  Anterior Thigh Region Severe depletion  Posterior Calf Region Severe depletion  Edema (RD Assessment) None  Hair Reviewed  Eyes Reviewed  Mouth Reviewed  Skin Reviewed  Nails Reviewed   Diet Order:   Diet Order             Diet full liquid Room service appropriate? Yes; Fluid consistency: Thin  Diet effective now                  EDUCATION NEEDS:  Not appropriate for education at this time  Skin:  Skin Assessment: Skin Integrity Issues: Skin Integrity Issues:: Other (Comment) Other: MASD - L & R buttocks  Last BM:  05/20/21  Height:  Ht Readings from Last 1 Encounters:  05/19/21 5\' 4"  (1.626 m)   Weight:  Wt Readings from Last 1 Encounters:  05/19/21 49.8 kg   BMI:  Body mass index is 18.85 kg/m.  Estimated Nutritional Needs:  Kcal:  1900-2100 Protein:  75-90 grams Fluid:  >1.9 L  Derrel Nip, RD, LDN (she/her/hers) Registered Dietitian I After-Hours/Weekend Pager # in [place]

## 2021-05-20 NOTE — Progress Notes (Signed)
[facility] for Heparin  Indication: Aortic Thrombus  Allergies  Allergen Reactions   Codeine    Epinephrine (Anaphylaxis)    Orange Fruit [Citrus] Other (See Comments)    Blisters on face.   Streptogramins    Tomato Other (See Comments)    Blisters on face    Patient Measurements: Height: 5\' 4"  (162.6 cm) Weight: 49.8 kg (109 lb 12.6 oz) IBW/kg (Calculated) : 54.7 Heparin Dosing Weight: recent oncology office visit weight ~ 50 kg  44.5 kg per ED  Vital Signs: Temp: 98.2 F (36.8 C) (10/03 0437) BP: 112/71 (10/03 0437) Pulse Rate: 102 (10/03 0437)  Labs: Recent Labs    05/18/21 1750 05/18/21 2044 05/19/21 0035 05/19/21 0618 05/19/21 0619 05/19/21 1155 05/20/21 0400  HGB 17.3*  --   --  14.2  --   --  15.6*  HCT 46.9*  --   --  39.7  --   --  43.5  PLT 92*  --   --  82*  --   --  43*  APTT  --  24  --   --   --   --   --   LABPROT  --  16.6*  --  18.1*  --   --   --   INR  --  1.3*  --  1.5*  --   --   --   HEPARINUNFRC  --   --   --   --  0.65 0.67 0.66  CREATININE 0.81  --   --  0.67  --   --  0.61  TROPONINIHS  --  7 9  --   --   --   --      Estimated Creatinine Clearance: 59.5 mL/min (by C-G formula based on SCr of 0.61 mg/dL).   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 Pharmacy consulted to initiate heparin infusion for aortic thrombus per CT abdomen/pelvis. Vascular surgery recommending continued anticoagulation to prevent further propagation or embolization of thrombus.   05/20/21 Heparin level  therapeutic at 0.66 Hgb 15.6  Pltc down to 43K due to chemo No bleeding or issues  with heparin infusion reported    Goal of Therapy:  Heparin level 0.3-0.7 units/ml Monitor platelets by anticoagulation protocol: Yes   Plan:  Continue IV heparin gtt @ 800 units/hr Daily heparin level & CBC F/U plans for oral anticoagulation   Eudelia Bunch, Pharm.D 05/20/2021 9:11 AM

## 2021-05-21 DIAGNOSIS — A0811 Acute gastroenteropathy due to Norwalk agent: Secondary | ICD-10-CM | POA: Diagnosis present

## 2021-05-21 DIAGNOSIS — G9340 Encephalopathy, unspecified: Secondary | ICD-10-CM | POA: Diagnosis not present

## 2021-05-21 LAB — CBC
HCT: 38 % (ref 36.0–46.0)
Hemoglobin: 13.4 g/dL (ref 12.0–15.0)
MCH: 33.8 pg (ref 26.0–34.0)
MCHC: 35.3 g/dL (ref 30.0–36.0)
MCV: 95.7 fL (ref 80.0–100.0)
Platelets: 63 10*3/uL — ABNORMAL LOW (ref 150–400)
RBC: 3.97 MIL/uL (ref 3.87–5.11)
RDW: 19.4 % — ABNORMAL HIGH (ref 11.5–15.5)
WBC: 21.6 10*3/uL — ABNORMAL HIGH (ref 4.0–10.5)
nRBC: 0 % (ref 0.0–0.2)

## 2021-05-21 LAB — BASIC METABOLIC PANEL
Anion gap: 6 (ref 5–15)
BUN: 27 mg/dL — ABNORMAL HIGH (ref 6–20)
CO2: 22 mmol/L (ref 22–32)
Calcium: 8 mg/dL — ABNORMAL LOW (ref 8.9–10.3)
Chloride: 104 mmol/L (ref 98–111)
Creatinine, Ser: 0.63 mg/dL (ref 0.44–1.00)
GFR, Estimated: >60 mL/min (ref >60)
Glucose, Bld: 168 mg/dL — ABNORMAL HIGH (ref 70–99)
Potassium: 3 mmol/L — ABNORMAL LOW (ref 3.5–5.1)
Sodium: 132 mmol/L — ABNORMAL LOW (ref 135–145)

## 2021-05-21 LAB — MAGNESIUM: Magnesium: 2.3 mg/dL (ref 1.7–2.4)

## 2021-05-21 LAB — HEPARIN LEVEL (UNFRACTIONATED): Heparin Unfractionated: 0.57 IU/mL (ref 0.30–0.70)

## 2021-05-21 LAB — GLUCOSE, CAPILLARY: Glucose-Capillary: 135 mg/dL — ABNORMAL HIGH (ref 70–99)

## 2021-05-21 MED ORDER — APIXABAN 5 MG PO TABS
10.0000 mg | ORAL_TABLET | Freq: Two times a day (BID) | ORAL | Status: DC
  Administered 2021-05-21 – 2021-05-27 (×12): 10 mg via ORAL
  Filled 2021-05-21 (×7): qty 2
  Filled 2021-05-21: qty 4
  Filled 2021-05-21 (×6): qty 2

## 2021-05-21 MED ORDER — LACTATED RINGERS IV SOLN: INTRAVENOUS | Status: AC

## 2021-05-21 MED ORDER — APIXABAN 5 MG PO TABS: 5.0000 mg | ORAL_TABLET | Freq: Two times a day (BID) | ORAL | Status: DC

## 2021-05-21 MED ORDER — METOPROLOL TARTRATE 5 MG/5ML IV SOLN
5.0000 mg | Freq: Once | INTRAVENOUS | Status: AC
  Administered 2021-05-21: 5 mg via INTRAVENOUS
  Filled 2021-05-21: qty 5

## 2021-05-21 MED ORDER — POTASSIUM CHLORIDE CRYS ER 20 MEQ PO TBCR
40.0000 meq | EXTENDED_RELEASE_TABLET | ORAL | Status: AC
  Administered 2021-05-21 (×2): 40 meq via ORAL
  Filled 2021-05-21 (×2): qty 2

## 2021-05-21 NOTE — Progress Notes (Signed)
Angela Mcpherson   DOB:[birth year]   MW#:[id number]   YQI#:[id number]  Hem/Onc follow up note   Subjective: Patient is overall doing much better, she is alert, more oriented, answer most of questions but is still slightly confused, she denies any pain, her diarrhea has improved, she did have bowel movement this morning.  No hematochezia.    Objective:  Vitals:   05/21/21 0545 05/21/21 1235  BP: 113/83 114/87  Pulse: (!) 110 (!) 120  Resp: 17 16  Temp: 98.2 F (36.8 C)   SpO2: 97% 96%    Body mass index is 18.85 kg/m.  Intake/Output Summary (Last 24 hours) at 05/21/2021 1553 Last data filed at 05/21/2021 1100 Gross per 24 hour  Intake 3202.32 ml  Output 905 ml  Net 2297.32 ml     Sclerae unicteric  No peripheral adenopathy  Lungs clear -- no rales or rhonchi  Heart regular rate and rhythm  Abdomen benign  MSK no focal spinal tenderness, no peripheral edema  Neuro nonfocal    CBG (last 3)  No results for input(s): GLUCAP in the last 72 hours.   Labs:   Urine Studies No results for input(s): UHGB, CRYS in the last 72 hours.  Invalid input(s): UACOL, UAPR, USPG, UPH, UTP, UGL, UKET, UBIL, UNIT, UROB, [place], UEPI, UWBC, [place], [place], [place], [place], [place]  Basic Metabolic Panel: Recent Labs  Lab 05/18/21 1750 05/19/21 0618 05/19/21 0921 05/20/21 0400 05/21/21 0358  NA 129* 132*  --  130* 132*  K 5.0 4.5  --  3.5 3.0*  CL 93* 98  --  102 104  CO2 28 27  --  22 22  GLUCOSE 132* 112*  --  141* 168*  BUN 49* 35*  --  34* 27*  CREATININE 0.81 0.67  --  0.61 0.63  CALCIUM 9.8 8.6*  --  8.0* 8.0*  MG 2.3  --  1.9 1.7 2.3   GFR Estimated Creatinine Clearance: 59.5 mL/min (by C-G formula based on SCr of 0.63 mg/dL). Liver Function Tests: Recent Labs  Lab 05/18/21 1750 05/19/21 0618  AST 35 27  ALT 31 26  ALKPHOS 102 73  BILITOT 1.4* 0.8  PROT 6.0* 4.5*  ALBUMIN 3.0* 2.2*   No results for input(s): LIPASE, AMYLASE in the last 168 hours. Recent Labs  Lab  05/18/21 1751  AMMONIA 38*   Coagulation profile Recent Labs  Lab 05/18/21 2044 05/19/21 0618  INR 1.3* 1.5*    CBC: Recent Labs  Lab 05/18/21 1750 05/19/21 0618 05/20/21 0400 05/21/21 0358  WBC 22.7* 20.9* 26.9* 21.6*  NEUTROABS 14.1*  --   --   --   HGB 17.3* 14.2 15.6* 13.4  HCT 46.9* 39.7 43.5 38.0  MCV 91.6 94.5 94.6 95.7  PLT 92* 82* 43* 63*   Cardiac Enzymes: No results for input(s): CKTOTAL, CKMB, CKMBINDEX, TROPONINI in the last 168 hours. BNP: Invalid input(s): POCBNP CBG: No results for input(s): GLUCAP in the last 168 hours. D-Dimer Recent Labs    05/18/21 2044  DDIMER 2.20*   Hgb A1c No results for input(s): HGBA1C in the last 72 hours. Lipid Profile No results for input(s): CHOL, HDL, LDLCALC, TRIG, CHOLHDL, LDLDIRECT in the last 72 hours. Thyroid function studies Recent Labs    05/18/21 2039  TSH 6.354*   Anemia work up No results for input(s): VITAMINB12, FOLATE, FERRITIN, TIBC, IRON, RETICCTPCT in the last 72 hours. Microbiology Recent Results (from the past 240 hour(s))  Resp Panel by RT-PCR (Flu A&B, Covid)  Nasopharyngeal Swab     Status: None   Collection Time: 05/18/21  5:50 PM   Specimen: Nasopharyngeal Swab; Nasopharyngeal(NP) swabs in vial transport medium  Result Value Ref Range Status   SARS Coronavirus 2 by RT PCR NEGATIVE NEGATIVE Final    Comment: (NOTE) SARS-CoV-2 target nucleic acids are NOT DETECTED.  The SARS-CoV-2 RNA is generally detectable in upper respiratory specimens during the acute phase of infection. The lowest concentration of SARS-CoV-2 viral copies this assay can detect is 138 copies/mL. A negative result does not preclude SARS-Cov-2 infection and should not be used as the sole basis for treatment or other patient management decisions. A negative result may occur with  improper specimen collection/handling, submission of specimen other than nasopharyngeal swab, presence of viral mutation(s) within  the areas targeted by this assay, and inadequate number of viral copies(<138 copies/mL). A negative result must be combined with clinical observations, patient history, and epidemiological information. The expected result is Negative.  Fact Sheet for Patients:  EntrepreneurPulse.com.au  Fact Sheet for Healthcare Providers:  IncredibleEmployment.be  This test is no t yet approved or cleared by the [place] FDA and  has been authorized for detection and/or diagnosis of SARS-CoV-2 by FDA under an Emergency Use Authorization (EUA). This EUA will remain  in effect (meaning this test can be used) for the duration of the COVID-19 declaration under Section 564(b)(1) of the Act, 21 U.S.C.section 360bbb-3(b)(1), unless the authorization is terminated  or revoked sooner.       Influenza A by PCR NEGATIVE NEGATIVE Final   Influenza B by PCR NEGATIVE NEGATIVE Final    Comment: (NOTE) The Xpert Xpress SARS-CoV-2/FLU/RSV plus assay is intended as an aid in the diagnosis of influenza from Nasopharyngeal swab specimens and should not be used as a sole basis for treatment. Nasal washings and aspirates are unacceptable for Xpert Xpress SARS-CoV-2/FLU/RSV testing.  Fact Sheet for Patients: EntrepreneurPulse.com.au  Fact Sheet for Healthcare Providers: IncredibleEmployment.be  This test is not yet approved or cleared by the [place] FDA and has been authorized for detection and/or diagnosis of SARS-CoV-2 by FDA under an Emergency Use Authorization (EUA). This EUA will remain in effect (meaning this test can be used) for the duration of the COVID-19 declaration under Section 564(b)(1) of the Act, 21 U.S.C. section 360bbb-3(b)(1), unless the authorization is terminated or revoked.  Performed at [facility], [place] [address] [place], [place] [address]   Blood culture (routine x 2)     Status:  None (Preliminary result)   Collection Time: 05/18/21  7:02 PM   Specimen: BLOOD  Result Value Ref Range Status   Specimen Description   Final    BLOOD LEFT ANTECUBITAL Performed at [facility] [address], [place], [place] [address]    Special Requests   Final    BOTTLES DRAWN AEROBIC AND ANAEROBIC Blood Culture adequate volume Performed at [facility] [address], [place], [place] [address]    Culture   Final    NO GROWTH 2 DAYS Performed at [facility] [address] [place], [place] [address]    Report Status PENDING  Incomplete  Blood culture (routine x 2)     Status: None (Preliminary result)   Collection Time: 05/18/21  7:08 PM   Specimen: BLOOD  Result Value Ref Range Status   Specimen Description   Final    BLOOD RIGHT ANTECUBITAL Performed at [facility] [address]  [place], [place] [address]    Special Requests   Final    BOTTLES DRAWN AEROBIC AND ANAEROBIC Blood Culture adequate volume Performed at [facility] [address], [place], [place] [address]    Culture   Final    NO GROWTH 2 DAYS Performed at [facility] [address] [place], [place] [address]    Report Status PENDING  Incomplete  Urine Culture     Status: None   Collection Time: 05/18/21  9:01 PM   Specimen: Urine, Catheterized  Result Value Ref Range Status   Specimen Description   Final    URINE, CATHETERIZED Performed at [facility] [address], [place], [place] [address]    Special Requests   Final    NONE Performed at [facility], [place] [address], [place], [place] [address]    Culture   Final    NO GROWTH Performed at [facility], [place] [address], [place], [place] [address]    Report Status 05/20/2021 FINAL  Final  Gastrointestinal Panel by PCR , Stool     Status: Abnormal   Collection Time: 05/19/21 10:54 PM   Specimen:  Stool  Result Value Ref Range Status   Campylobacter species NOT DETECTED NOT DETECTED Final   Plesimonas shigelloides NOT DETECTED NOT DETECTED Final   Salmonella species NOT DETECTED NOT DETECTED Final   Yersinia enterocolitica NOT DETECTED NOT DETECTED Final   Vibrio species NOT DETECTED NOT DETECTED Final   Vibrio cholerae NOT DETECTED NOT DETECTED Final   Enteroaggregative E coli (EAEC) NOT DETECTED NOT DETECTED Final   Enteropathogenic E coli (EPEC) NOT DETECTED NOT DETECTED Final   Enterotoxigenic E coli (ETEC) NOT DETECTED NOT DETECTED Final   Shiga like toxin producing E coli (STEC) NOT DETECTED NOT DETECTED Final   Shigella/Enteroinvasive E coli (EIEC) NOT DETECTED NOT DETECTED Final   Cryptosporidium NOT DETECTED NOT DETECTED Final   Cyclospora cayetanensis NOT DETECTED NOT DETECTED Final   Entamoeba histolytica NOT DETECTED NOT DETECTED Final   Giardia lamblia NOT DETECTED NOT DETECTED Final   Adenovirus F40/41 NOT DETECTED NOT DETECTED Final   Astrovirus NOT DETECTED NOT DETECTED Final   Norovirus GI/GII DETECTED (A) NOT DETECTED Final    Comment: RESULT CALLED TO, READ BACK BY AND VERIFIED WITH: C/REBECCA PULEO 05/20/21 1052 AMK    Rotavirus A NOT DETECTED NOT DETECTED Final   Sapovirus (I, II, IV, and V) NOT DETECTED NOT DETECTED Final    Comment: Performed at [facility], [place] [place], [place] [address]  C Difficile Quick Screen w PCR reflex     Status: Abnormal   Collection Time: 05/19/21 10:54 PM   Specimen: Stool  Result Value Ref Range Status   C Diff antigen NON REACTIVE (A) NEGATIVE Final   C Diff toxin NON REACTIVE (A) NEGATIVE Final   C Diff interpretation VALID  Final    Comment: Performed at [facility], [place] [address], [place],  [address]      Studies:  No results found.  Assessment: 60 y.o.  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 She is not  very comfortable with Lovenox injection.  Due to the interaction of Coumadin and her oral chemo, I recommend Xarelto or Eliquis -Continue holding chemotherapy.  Discussed with patient's husband, -I will f/u as needed   Truitt Merle, MD 05/21/2021  3:53 PM

## 2021-05-21 NOTE — Progress Notes (Signed)
[place] for Heparin  Indication: Aortic Thrombus  Allergies  Allergen Reactions   Codeine    Epinephrine (Anaphylaxis)    Orange Fruit [Citrus] Other (See Comments)    Blisters on face.   Streptogramins    Tomato Other (See Comments)    Blisters on face    Patient Measurements: Height: 5\' 4"  (162.6 cm) Weight: 49.8 kg (109 lb 12.6 oz) IBW/kg (Calculated) : 54.7 Heparin Dosing Weight: recent oncology office visit weight ~ 50 kg  44.5 kg per ED  Vital Signs: Temp: 98.2 F (36.8 C) (10/04 0545) Temp Source: Oral (10/04 0545) BP: 114/87 (10/04 1235) Pulse Rate: 120 (10/04 1235)  Labs: Recent Labs    05/18/21 2044 05/19/21 0035 05/19/21 0618 05/19/21 0619 05/19/21 1155 05/20/21 0400 05/21/21 0358  HGB  --   --  14.2  --   --  15.6* 13.4  HCT  --   --  39.7  --   --  43.5 38.0  PLT  --   --  82*  --   --  43* 63*  APTT 24  --   --   --   --   --   --   LABPROT 16.6*  --  18.1*  --   --   --   --   INR 1.3*  --  1.5*  --   --   --   --   HEPARINUNFRC  --   --   --    < > 0.67 0.66 0.57  CREATININE  --   --  0.67  --   --  0.61 0.63  TROPONINIHS 7 9  --   --   --   --   --    < > = values in this interval not displayed.    Estimated Creatinine Clearance: 59.5 mL/min (by C-G formula based on SCr of 0.63 mg/dL).   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 Pharmacy consulted to initiate heparin infusion for aortic thrombus per CT abdomen/pelvis. Vascular surgery recommending continued anticoagulation to prevent further propagation or embolization of thrombus.   05/21/2021  Heparin level  therapeutic at 0.57 Hgb 13.4; Pltc 63K No bleeding or issues with heparin infusion reported    Goal of Therapy:  Heparin level 0.3-0.7 units/ml Monitor platelets by anticoagulation protocol: Yes   Plan:  Continue IV heparin gtt @ 800 units/hr Daily heparin level & CBC F/U plans for oral anticoagulation   Angela Mcpherson A Angela Mcpherson 05/21/2021 2:23 PM

## 2021-05-21 NOTE — Plan of Care (Signed)
  Problem: Pain Managment: Goal: General experience of comfort will improve Outcome: Completed/Met

## 2021-05-21 NOTE — Progress Notes (Signed)
Pt resting in chair with husband at bedside. Assessment unchanged. Will continue plan of care. SP, RN

## 2021-05-21 NOTE — Evaluation (Signed)
Physical Therapy Evaluation Patient Details Name: Angela Mcpherson MRN: [id number] DOB: Jun 16, 1961 Today's Date: 05/21/2021  History of Present Illness  60 yo female admitted with AMS, acute encephalopathy, weakness, colitis, acute thrombus abd aorta. Hx of stage IV rectal ca-chemo  Clinical Impression  On eval, pt required Min A for mobility. She walked ~20 feet around the room with support of IV pole and steadying assistance from therapist. She tolerated activity well. She is still having some loose stools per pt report but she did not have any during my session. No family present during. Cognition seems improved compared to when she first arrived (per chart review) but she doesn't seem like she is quite back to baseline ( no family present to confirm this). Will plan to follow and progress activity as tolerated.        Recommendations for follow up therapy are one component of a multi-disciplinary discharge planning process, led by the attending physician.  Recommendations may be updated based on patient status, additional functional criteria and insurance authorization.  Follow Up Recommendations Home health PT;Supervision/Assistance - 24 hour    Equipment Recommendations  Rolling walker with 5" wheels (possibly-continuing to assess)    Recommendations for Other Services       Precautions / Restrictions Precautions Precautions: Fall Restrictions Weight Bearing Restrictions: No      Mobility  Bed Mobility Overal bed mobility: Needs Assistance Bed Mobility: Supine to Sit     Supine to sit: Min guard     General bed mobility comments: Cues provided. Increased time. Relied on bedrail    Transfers Overall transfer level: Needs assistance Equipment used: None Transfers: Sit to/from [facility] Sit to Stand: Min assist Stand pivot transfers: Min assist       General transfer comment: Assist to rise, steady. Stand pivot, bed to bsc, pt relied on armrests for  support.  Ambulation/Gait Ambulation/Gait assistance: Min assist Gait Distance (Feet): 20 Feet Assistive device: IV Pole Gait Pattern/deviations: Step-through pattern;Decreased stride length     General Gait Details: Unsteady. Assist to stabilize pt and maneuver IV pole around room for 2 laps. Pt tolerated well.  Stairs            Wheelchair Mobility    Modified Rankin (Stroke Patients Only)       Balance Overall balance assessment: Needs assistance         Standing balance support: During functional activity Standing balance-Leahy Scale: Poor                               Pertinent Vitals/Pain Pain Assessment: Faces Faces Pain Scale: Hurts little more Pain Location: groin area; buttocks when wiping Pain Descriptors / Indicators: Discomfort;Sore Pain Intervention(s): Limited activity within patient's tolerance;Monitored during session;Repositioned    Home Living Family/patient expects to be discharged to:: Private residence Living Arrangements: Spouse/significant other Available Help at Discharge: Family;Available PRN/intermittently Type of Home: House Home Access: Stairs to enter     Home Layout: One level;Laundry or work area in [employer]: None      Prior Function Level of Independence: Independent               Journalist, newspaper        Extremity/Trunk Assessment   Upper Extremity Assessment Upper Extremity Assessment: Defer to OT evaluation    Lower Extremity Assessment Lower Extremity Assessment: Generalized weakness    Cervical / Trunk Assessment Cervical / Trunk Assessment: Normal  Communication   Communication: No difficulties  Cognition Arousal/Alertness: Awake/alert Behavior During Therapy: WFL for tasks assessed/performed Overall Cognitive Status: No family/caregiver present to determine baseline cognitive functioning Area of Impairment: Attention;Problem solving                   Current  Attention Level: Selective         Problem Solving: Requires verbal cues General Comments: slow to respond at times but responses seem appropriate. A&O      General Comments      Exercises     Assessment/Plan    PT Assessment Patient needs continued PT services  PT Problem List Decreased strength;Decreased mobility;Decreased activity tolerance;Decreased balance;Decreased knowledge of use of DME;Decreased cognition;Decreased safety awareness;Pain       PT Treatment Interventions DME instruction;Gait training;Therapeutic exercise;Balance training;Functional mobility training;Therapeutic activities;Patient/family education    PT Goals (Current goals can be found in the Care Plan section)  Acute Rehab PT Goals Patient Stated Goal: to get better/regain PLOF PT Goal Formulation: With patient Time For Goal Achievement: 06/04/21 Potential to Achieve Goals: Good    Frequency Min 3X/week   Barriers to discharge Decreased caregiver support husband works during the day    Co-evaluation               AM-PAC PT "6 Clicks" Mobility  Outcome Measure Help needed turning from your back to your side while in a flat bed without using bedrails?: A Little Help needed moving from lying on your back to sitting on the side of a flat bed without using bedrails?: A Little Help needed moving to and from a bed to a chair (including a wheelchair)?: A Little Help needed standing up from a chair using your arms (e.g., wheelchair or bedside chair)?: A Little Help needed to walk in hospital room?: A Little Help needed climbing 3-5 steps with a railing? : A Lot 6 Click Score: 17    End of Session   Activity Tolerance: Patient tolerated treatment well Patient left: in chair;with call bell/phone within reach;with chair alarm set   PT Visit Diagnosis: Muscle weakness (generalized) (M62.81);Difficulty in walking, not elsewhere classified (R26.2)    Time: 4680-3212 PT Time Calculation (min)  (ACUTE ONLY): 28 min   Charges:   PT Evaluation $PT Eval Moderate Complexity: 1 Mod PT Treatments $Gait Training: 8-22 mins           Doreatha Massed, PT Acute Rehabilitation  Office: [phone] Pager: [phone]

## 2021-05-22 DIAGNOSIS — G9340 Encephalopathy, unspecified: Secondary | ICD-10-CM | POA: Diagnosis not present

## 2021-05-22 LAB — MAGNESIUM: Magnesium: 2 mg/dL (ref 1.7–2.4)

## 2021-05-22 LAB — HEPARIN LEVEL (UNFRACTIONATED): Heparin Unfractionated: >1.1 IU/mL — ABNORMAL HIGH (ref 0.30–0.70)

## 2021-05-22 LAB — HEMOGLOBIN AND HEMATOCRIT, BLOOD
HCT: 33.7 % — ABNORMAL LOW (ref 36.0–46.0)
Hemoglobin: 12 g/dL (ref 12.0–15.0)

## 2021-05-22 LAB — CBC WITH DIFFERENTIAL/PLATELET
Abs Immature Granulocytes: 0.29 10*3/uL — ABNORMAL HIGH (ref 0.00–0.07)
Basophils Absolute: 0 10*3/uL (ref 0.0–0.1)
Basophils Relative: 0 %
Eosinophils Absolute: 0 10*3/uL (ref 0.0–0.5)
Eosinophils Relative: 0 %
HCT: 32.9 % — ABNORMAL LOW (ref 36.0–46.0)
Hemoglobin: 11.7 g/dL — ABNORMAL LOW (ref 12.0–15.0)
Immature Granulocytes: 1 %
Lymphocytes Relative: 11 %
Lymphs Abs: 2.7 10*3/uL (ref 0.7–4.0)
MCH: 34.2 pg — ABNORMAL HIGH (ref 26.0–34.0)
MCHC: 35.6 g/dL (ref 30.0–36.0)
MCV: 96.2 fL (ref 80.0–100.0)
Monocytes Absolute: 0.9 10*3/uL (ref 0.1–1.0)
Monocytes Relative: 4 %
Neutro Abs: 20.5 10*3/uL — ABNORMAL HIGH (ref 1.7–7.7)
Neutrophils Relative %: 84 %
Platelets: 51 10*3/uL — ABNORMAL LOW (ref 150–400)
RBC: 3.42 MIL/uL — ABNORMAL LOW (ref 3.87–5.11)
RDW: 19.9 % — ABNORMAL HIGH (ref 11.5–15.5)
WBC: 24.4 10*3/uL — ABNORMAL HIGH (ref 4.0–10.5)
nRBC: 0.1 % (ref 0.0–0.2)

## 2021-05-22 LAB — BASIC METABOLIC PANEL
Anion gap: 7 (ref 5–15)
BUN: 24 mg/dL — ABNORMAL HIGH (ref 6–20)
CO2: 18 mmol/L — ABNORMAL LOW (ref 22–32)
Calcium: 7.9 mg/dL — ABNORMAL LOW (ref 8.9–10.3)
Chloride: 105 mmol/L (ref 98–111)
Creatinine, Ser: 0.64 mg/dL (ref 0.44–1.00)
GFR, Estimated: >60 mL/min (ref >60)
Glucose, Bld: 146 mg/dL — ABNORMAL HIGH (ref 70–99)
Potassium: 3.6 mmol/L (ref 3.5–5.1)
Sodium: 130 mmol/L — ABNORMAL LOW (ref 135–145)

## 2021-05-22 NOTE — Discharge Instructions (Addendum)
Information on my medicine - ELIQUIS (apixaban)  This medication education was reviewed with me or my healthcare representative as part of my discharge preparation. The pharmacist that spoke with me during my hospital stay was: Nicolo A Jimenez Student-PharmD  Why was Eliquis prescribed for you? Eliquis was prescribed to treat blood clots that may have been found in the veins of your legs (deep vein thrombosis) or in your lungs (pulmonary embolism) and to reduce the risk of them occurring again.  What do You need to know about Eliquis ? The starting dose is 10 mg (two 5 mg tablets) taken TWICE daily for the FIRST SEVEN (7) DAYS, then on 10/11, the dose is reduced to ONE 5 mg tablet taken TWICE daily. Eliquis  may be taken with or without food.   Try to take the dose about the same time in the morning and in the evening. If you have difficulty swallowing the tablet whole please discuss with your pharmacist how to take the medication safely.  Take Eliquis exactly as prescribed and DO NOT stop taking Eliquis without talking to the doctor who prescribed the medication.  Stopping may increase your risk of developing a new blood clot.  Refill your prescription before you run out.  After discharge, you should have regular check-up appointments with your healthcare provider that is prescribing your Eliquis.    What do you do if you miss a dose? If a dose of ELIQUIS is not taken at the scheduled time, take it as soon as possible on the same day and twice-daily administration should be resumed. The dose should not be doubled to make up for a missed dose.  Important Safety Information A possible side effect of Eliquis is bleeding. You should call your healthcare provider right away if you experience any of the following: Bleeding from an injury or your nose that does not stop. Unusual colored urine (red or dark brown) or unusual colored stools (red or black). Unusual bruising for unknown  reasons. A serious fall or if you hit your head (even if there is no bleeding).  Some medicines may interact with Eliquis and might increase your risk of bleeding or clotting while on Eliquis. To help avoid this, consult your healthcare provider or pharmacist prior to using any new prescription or non-prescription medications, including herbals, vitamins, non-steroidal anti-inflammatory drugs (NSAIDs) and supplements.  This website has more information on Eliquis (apixaban): http://www.eliquis.com/eliquis/home

## 2021-05-22 NOTE — Evaluation (Signed)
Occupational Therapy Evaluation Patient Details Name: Angela Mcpherson MRN: [id number] DOB: September 05, 1960 Today's Date: 05/22/2021   History of Present Illness 60 yo female admitted with AMS, acute encephalopathy, weakness, colitis, acute thrombus abd aorta. Hx of stage IV rectal ca-chemo   Clinical Impression   Mrs. Angela Mcpherson is a 60 year old woman typically independent who presents with generalized weakness, decreased activity tolerance and impaired balance resulting in a decline in independence with ADLs and mobility. Overall patient min assist for steadying with transfers and min assist for ADLs. Today patient limited by continued incontinence of BMs resulting in need for toileting transfer and pericare and LB washing which she was able to assist in.  Patient will benefit from skilled OT services while in hospital to improve deficits and learn compensatory strategies as needed in order to return to PLOF.       Recommendations for follow up therapy are one component of a multi-disciplinary discharge planning process, led by the attending physician.  Recommendations may be updated based on patient status, additional functional criteria and insurance authorization.   Follow Up Recommendations  Home health OT    Equipment Recommendations  Tub/shower seat    Recommendations for Other Services       Precautions / Restrictions Precautions Precautions: Fall Restrictions Weight Bearing Restrictions: No      Mobility Bed Mobility Overal bed mobility: Needs Assistance Bed Mobility: Supine to Sit     Supine to sit: Supervision          Transfers Overall transfer level: Needs assistance Equipment used: None Transfers: Sit to/from Stand;Stand Pivot Transfers Sit to Stand: Min assist Stand pivot transfers: Min assist       General transfer comment: Overall min assist to steady patient. Mobility limited by incontinence. BM noted to be dark red - RN notified.    Balance  Overall balance assessment: Mild deficits observed, not formally tested                                         ADL either performed or assessed with clinical judgement   ADL Overall ADL's : Needs assistance/impaired Eating/Feeding: Independent   Grooming: Set up;Sitting   Upper Body Bathing: Set up;Sitting   Lower Body Bathing: Min guard;Sit to/from stand   Upper Body Dressing : Set up;Sitting   Lower Body Dressing: Minimal assistance;Sit to/from stand   Toilet Transfer: Minimal assistance;Stand-pivot;BSC   Toileting- Clothing Manipulation and Hygiene: Minimal assistance Toileting - Clothing Manipulation Details (indicate cue type and reason): to assit with perianal care -;limited by frequent BMs and excoriated skin. Tub/ Shower Transfer: Minimal assistance   Functional mobility during ADLs: Minimal assistance       Vision Patient Visual Report: No change from baseline       Perception     Praxis      Pertinent Vitals/Pain Pain Assessment: No/denies pain     Hand Dominance Right   Extremity/Trunk Assessment Upper Extremity Assessment Upper Extremity Assessment: Overall WFL for tasks assessed (WFL ROM bilaterally, grossly 4/5 strength)   Lower Extremity Assessment Lower Extremity Assessment: Defer to PT evaluation   Cervical / Trunk Assessment Cervical / Trunk Assessment: Normal   Communication Communication Communication: No difficulties   Cognition Arousal/Alertness: Awake/alert Behavior During Therapy: WFL for tasks assessed/performed Overall Cognitive Status: Within Functional Limits for tasks assessed  General Comments: alert to room, date, circumstances. RN reports some mild confusion last night.   General Comments       Exercises     Shoulder Instructions      Home Living Family/patient expects to be discharged to:: Private residence Living Arrangements: Spouse/significant  other Available Help at Discharge: Family;Available PRN/intermittently Type of Home: House Home Access: Stairs to enter     Home Layout: One level;Laundry or work area in basement     [employer] Shower/Tub: Teacher, early years/pre: [employer]: None   Additional Comments: bidet      Prior Functioning/Environment Level of Independence: Independent                 OT Problem List: Decreased strength;Decreased activity tolerance;Impaired balance (sitting and/or standing);Decreased knowledge of use of DME or AE;Pain      OT Treatment/Interventions: Self-care/ADL training;Therapeutic exercise;DME and/or AE instruction;Therapeutic activities;Balance training;Patient/family education    OT Goals(Current goals can be found in the care plan section) Acute Rehab OT Goals Patient Stated Goal: to get better/regain PLOF OT Goal Formulation: With patient Time For Goal Achievement: 06/05/21 Potential to Achieve Goals: Good  OT Frequency: Min 2X/week   Barriers to D/C:            Co-evaluation              AM-PAC OT "6 Clicks" Daily Activity     Outcome Measure Help from another person eating meals?: None Help from another person taking care of personal grooming?: A Little Help from another person toileting, which includes using toliet, bedpan, or urinal?: A Little Help from another person bathing (including washing, rinsing, drying)?: A Little Help from another person to put on and taking off regular upper body clothing?: A Little Help from another person to put on and taking off regular lower body clothing?: A Little 6 Click Score: 19   End of Session Nurse Communication: Mobility status (bloody BMs)  Activity Tolerance: Patient tolerated treatment well Patient left: in chair;with call bell/phone within reach;with chair alarm set  OT Visit Diagnosis: Unsteadiness on feet (R26.81);Muscle weakness (generalized) (M62.81)                 Time: 1751-0258 OT Time Calculation (min): 24 min Charges:  OT General Charges $OT Visit: 1 Visit OT Evaluation $OT Eval Low Complexity: 1 Low OT Treatments $Self Care/Home Management : 8-22 mins  Bijan Ridgley, OTR/L [place] [phone] Pager: [place] 05/22/2021, 10:30 AM

## 2021-05-22 NOTE — Progress Notes (Addendum)
PROGRESS NOTE    Angela Mcpherson   YCX:[id number]  DOB: Jul 15, 1961  PCP: Truitt Merle, MD    DOA: 05/18/2021 LOS: 3    Brief Narrative / Hospital Course to Date:   60 year old female with past medical history of stage IV rectal cancer presented to the ED with altered mental status, generalized weakness and watery diarrhea for about a week.  Husband reported with progressively worsening confusion, poor p.o. intake secondary to nausea vomiting at home.  Evaluation in the ED showed leukocytosis and labs consistent with dehydration.  CT abdomen pelvis with contrast showed a noncalcified thrombus in the infrarenal abdominal aorta causing severe stenosis signs of infectious versus inflammatory enterocolitis (possibly ischemic due to thrombus), small volume ascites, decreased size of rectosigmoid mass and previously identified liver lesions.  Stool studies subsequently positive for norovirus.  Assessment & Plan   Principal Problem:   Acute encephalopathy Active Problems:   Malignant neoplasm of rectosigmoid junction (HCC)   Colitis   Dehydration   Aortic mural thrombus (HCC)   Thrombocytopenia (HCC)   Protein-calorie malnutrition, severe   Gastroenteritis due to norovirus   Acute metabolic encephalopathy -due to acute infectious colitis and dehydration.  Ammonia only slightly elevated at 38.  CT head was negative. Clinically improving with supportive care and empiric antibiotics (cefepime and Flagyl). --Blood cultures negative x3 days, stop antibiotics and monitor clinically --Supportive care: Antiemetics, IV fluids, pain control  Infrarenal abdominal aortic mural thrombus -patient has been transitioned off heparin infusion to Eliquis. "Scans reviewed by vascular surgeon, Dr.Brabham who noted that patient does have palpable pedal pulses, no vascular intervention recommended at this time and her current condition with preservation of palpable pedal pulses and no evidence of embolic disease  is felt patient does not require open surgery or percutaneous stenting." --Hematology/oncology following, see their recommendations. --Continue Eliquis  Acute colitis due to norovirus gastroenteritis -CT of abdomen and pelvis showed wall thickening of the ascending colon, cecum, ileum.  Loose watery stools reported at home prior to admission.  C. difficile negative.  Treated with empiric cefepime and Flagyl. -- Continue IV fluids --Stop antibiotics and monitor  Melena/lower GI bleeding -reported by bedside RN this morning 10/5.  Most likely due to either the rectosigmoid mass and/or enterocolitis. --Recheck hemoglobin this afternoon given nearly two-point drop since yesterday (13.4>> 11.7). --Transfuse if hemoglobin drops less than 7  Hypokalemia -due to GI losses.  Monitor replace as needed.  Sinus tachycardia -most likely due to dehydration, colitis and just acute illness.  Continue IV fluids and supportive care.  Metoprolol as needed.  Dehydration -present on admission, due to GI losses and poor p.o. intake.  Continue IV hydration and supportive care.  Thrombocytopenia -felt due to consumption of platelets given large aortic mural thrombus.   Patient now with some GI bleeding noted today 10/5. Platelet trend: 82>> 43>> 63>> 51.  Metastatic stage IV colon cancer -oncology is following, chemotherapy on hold.  Supportive care monitor.    Patient BMI: Body mass index is 18.85 kg/m.   DVT prophylaxis:  apixaban (ELIQUIS) tablet 10 mg  apixaban (ELIQUIS) tablet 5 mg   Diet:  Diet Orders (From admission, onward)     Start     Ordered   05/21/21 0756  DIET SOFT Room service appropriate? Yes; Fluid consistency: Thin  Diet effective now       Question Answer Comment  Room service appropriate? Yes   Fluid consistency: Thin      05/21/21 0755  Code Status: Full Code   Subjective 05/22/21    Patient seen up in recliner on rounds this morning.  She reports not  having any nausea vomiting but did have some abdominal pain with urinating earlier today.  Bedside RN this morning reported dark red stool on chuck pad in the bed.  Patient confirms this.  She denies other acute complaints.   Disposition Plan & Communication   Status is: Inpatient  Remains inpatient appropriate because:IV treatments appropriate due to intensity of illness or inability to take PO  Dispo:  Patient From: Home  Planned Disposition: Home with Health Care Svc  Medically stable for discharge: No     Consults, Procedures, Significant Events   Consultants:  Hematology-oncology  Procedures:  None  Antimicrobials:  Anti-infectives (From admission, onward)    Start     Dose/Rate Route Frequency Ordered Stop   05/20/21 1000  ceFEPIme (MAXIPIME) 2 g in sodium chloride 0.9 % 100 mL IVPB        2 g 200 mL/hr over 30 Minutes Intravenous Every 8 hours 05/20/21 0919     05/19/21 1000  metroNIDAZOLE (FLAGYL) IVPB 500 mg        500 mg 100 mL/hr over 60 Minutes Intravenous Every 12 hours 05/18/21 2327 05/26/21 0959   05/18/21 2359  ceFEPIme (MAXIPIME) 2 g in sodium chloride 0.9 % 100 mL IVPB  Status:  Discontinued        2 g 200 mL/hr over 30 Minutes Intravenous Every 12 hours 05/18/21 2332 05/20/21 0919   05/18/21 2015  metroNIDAZOLE (FLAGYL) IVPB 500 mg        500 mg 100 mL/hr over 60 Minutes Intravenous  Once 05/18/21 2013 05/18/21 2234   05/18/21 1815  cefTRIAXone (ROCEPHIN) 1 g in sodium chloride 0.9 % 100 mL IVPB        1 g 200 mL/hr over 30 Minutes Intravenous  Once 05/18/21 1805 05/18/21 2033         Micro    Objective   Vitals:   05/21/21 1235 05/21/21 2112 05/22/21 0511 05/22/21 1147  BP: 114/87 112/76 105/73 106/65  Pulse: (!) 120 99 (!) 102 (!) 110  Resp: 16 20 20 18   Temp:  98 F (36.7 C) 98 F (36.7 C) (!) 97.5 F (36.4 C)  TempSrc:  Oral Oral Tympanic  SpO2: 96% 95% 96% 97%  Weight:      Height:        Intake/Output Summary (Last 24 hours)  at 05/22/2021 1415 Last data filed at 05/21/2021 1800 Gross per 24 hour  Intake 749.03 ml  Output --  Net 749.03 ml   Filed Weights   05/18/21 2044 05/19/21 1450  Weight: 44.5 kg 49.8 kg    Physical Exam:  General exam: awake, alert, no acute distress, frail, chronically ill-appearing HEENT: atraumatic, clear conjunctiva, anicteric sclera, moist mucus membranes, hearing grossly normal  Respiratory system: CTAB, no wheezes, rales or rhonchi, normal respiratory effort. Cardiovascular system: normal S1/S2, RRR, no pedal edema.   Gastrointestinal system: soft, NT, ND, no HSM felt, +bowel sounds. Central nervous system: A&O x3. no gross focal neurologic deficits, normal speech Extremities: moves all, no edema, normal tone Psychiatry: normal mood, congruent affect, judgement and insight appear normal  Labs   Data Reviewed: I have personally reviewed following labs and imaging studies  CBC: Recent Labs  Lab 05/18/21 1750 05/19/21 0618 05/20/21 0400 05/21/21 0358 05/22/21 0354  WBC 22.7* 20.9* 26.9* 21.6* 24.4*  NEUTROABS 14.1*  --   --   --  20.5*  HGB 17.3* 14.2 15.6* 13.4 11.7*  HCT 46.9* 39.7 43.5 38.0 32.9*  MCV 91.6 94.5 94.6 95.7 96.2  PLT 92* 82* 43* 63* 51*   Basic Metabolic Panel: Recent Labs  Lab 05/18/21 1750 05/19/21 0618 05/19/21 0921 05/20/21 0400 05/21/21 0358 05/22/21 0354  NA 129* 132*  --  130* 132* 130*  K 5.0 4.5  --  3.5 3.0* 3.6  CL 93* 98  --  102 104 105  CO2 28 27  --  22 22 18*  GLUCOSE 132* 112*  --  141* 168* 146*  BUN 49* 35*  --  34* 27* 24*  CREATININE 0.81 0.67  --  0.61 0.63 0.64  CALCIUM 9.8 8.6*  --  8.0* 8.0* 7.9*  MG 2.3  --  1.9 1.7 2.3 2.0   GFR: Estimated Creatinine Clearance: 59.5 mL/min (by C-G formula based on SCr of 0.64 mg/dL). Liver Function Tests: Recent Labs  Lab 05/18/21 1750 05/19/21 0618  AST 35 27  ALT 31 26  ALKPHOS 102 73  BILITOT 1.4* 0.8  PROT 6.0* 4.5*  ALBUMIN 3.0* 2.2*   No results for  input(s): LIPASE, AMYLASE in the last 168 hours. Recent Labs  Lab 05/18/21 1751  AMMONIA 38*   Coagulation Profile: Recent Labs  Lab 05/18/21 2044 05/19/21 0618  INR 1.3* 1.5*   Cardiac Enzymes: No results for input(s): CKTOTAL, CKMB, CKMBINDEX, TROPONINI in the last 168 hours. BNP (last 3 results) No results for input(s): PROBNP in the last 8760 hours. HbA1C: No results for input(s): HGBA1C in the last 72 hours. CBG: Recent Labs  Lab 05/21/21 2108  GLUCAP 135*   Lipid Profile: No results for input(s): CHOL, HDL, LDLCALC, TRIG, CHOLHDL, LDLDIRECT in the last 72 hours. Thyroid Function Tests: No results for input(s): TSH, T4TOTAL, FREET4, T3FREE, THYROIDAB in the last 72 hours. Anemia Panel: No results for input(s): VITAMINB12, FOLATE, FERRITIN, TIBC, IRON, RETICCTPCT in the last 72 hours. Sepsis Labs: Recent Labs  Lab 05/18/21 1903 05/18/21 2039 05/18/21 2044 05/19/21 0007 05/19/21 0251  PROCALCITON  --   --  0.77  --   --   LATICACIDVEN 2.1* 2.8*  --  3.2* 1.9    Recent Results (from the past 240 hour(s))  Resp Panel by RT-PCR (Flu A&B, Covid) Nasopharyngeal Swab     Status: None   Collection Time: 05/18/21  5:50 PM   Specimen: Nasopharyngeal Swab; Nasopharyngeal(NP) swabs in vial transport medium  Result Value Ref Range Status   SARS Coronavirus 2 by RT PCR NEGATIVE NEGATIVE Final    Comment: (NOTE) SARS-CoV-2 target nucleic acids are NOT DETECTED.  The SARS-CoV-2 RNA is generally detectable in upper respiratory specimens during the acute phase of infection. The lowest concentration of SARS-CoV-2 viral copies this assay can detect is 138 copies/mL. A negative result does not preclude SARS-Cov-2 infection and should not be used as the sole basis for treatment or other patient management decisions. A negative result may occur with  improper specimen collection/handling, submission of specimen other than nasopharyngeal swab, presence of viral mutation(s)  within the areas targeted by this assay, and inadequate number of viral copies(<138 copies/mL). A negative result must be combined with clinical observations, patient history, and epidemiological information. The expected result is Negative.  Fact Sheet for Patients:  EntrepreneurPulse.com.au  Fact Sheet for Healthcare Providers:  IncredibleEmployment.be  This test is no t yet approved or cleared by the [place] FDA and  has been authorized for detection and/or diagnosis of  SARS-CoV-2 by FDA under an Emergency Use Authorization (EUA). This EUA will remain  in effect (meaning this test can be used) for the duration of the COVID-19 declaration under Section 564(b)(1) of the Act, 21 U.S.C.section 360bbb-3(b)(1), unless the authorization is terminated  or revoked sooner.       Influenza A by PCR NEGATIVE NEGATIVE Final   Influenza B by PCR NEGATIVE NEGATIVE Final    Comment: (NOTE) The Xpert Xpress SARS-CoV-2/FLU/RSV plus assay is intended as an aid in the diagnosis of influenza from Nasopharyngeal swab specimens and should not be used as a sole basis for treatment. Nasal washings and aspirates are unacceptable for Xpert Xpress SARS-CoV-2/FLU/RSV testing.  Fact Sheet for Patients: EntrepreneurPulse.com.au  Fact Sheet for Healthcare Providers: IncredibleEmployment.be  This test is not yet approved or cleared by the [place] FDA and has been authorized for detection and/or diagnosis of SARS-CoV-2 by FDA under an Emergency Use Authorization (EUA). This EUA will remain in effect (meaning this test can be used) for the duration of the COVID-19 declaration under Section 564(b)(1) of the Act, 21 U.S.C. section 360bbb-3(b)(1), unless the authorization is terminated or revoked.  Performed at [facility], [place] [address] [place], [place] [address]   Blood culture (routine x 2)      Status: None (Preliminary result)   Collection Time: 05/18/21  7:02 PM   Specimen: BLOOD  Result Value Ref Range Status   Specimen Description   Final    BLOOD LEFT ANTECUBITAL Performed at [place] [address], [place], [place] [address]    Special Requests   Final    BOTTLES DRAWN AEROBIC AND ANAEROBIC Blood Culture adequate volume Performed at [place] [address], [place], [place] [address]    Culture   Final    NO GROWTH 3 DAYS Performed at [facility], [place] [address] [place], [place] [address]    Report Status PENDING  Incomplete  Blood culture (routine x 2)     Status: None (Preliminary result)   Collection Time: 05/18/21  7:08 PM   Specimen: BLOOD  Result Value Ref Range Status   Specimen Description   Final    BLOOD RIGHT ANTECUBITAL Performed at [place] [address] [place], [place] [address]    Special Requests   Final    BOTTLES DRAWN AEROBIC AND ANAEROBIC Blood Culture adequate volume Performed at [place] [address], [place], [place] [address]    Culture   Final    NO GROWTH 3 DAYS Performed at [facility], [place] [address] [place], [place] [address]    Report Status PENDING  Incomplete  Urine Culture     Status: None   Collection Time: 05/18/21  9:01 PM   Specimen: Urine, Catheterized  Result Value Ref Range Status   Specimen Description   Final    URINE, CATHETERIZED Performed at [place] [address], [place], [place] [address]    Special Requests   Final    NONE Performed at [facility], [place] [address], [place], [place] [address]    Culture   Final    NO GROWTH Performed at [facility], [place] [address], [place],  [address]    Report Status 05/20/2021 FINAL  Final  Gastrointestinal Panel by PCR , Stool     Status: Abnormal   Collection Time: 05/19/21 10:54 PM  Specimen: Stool  Result Value Ref Range Status   Campylobacter species NOT DETECTED NOT DETECTED Final   Plesimonas shigelloides NOT DETECTED NOT DETECTED Final   Salmonella species NOT DETECTED NOT DETECTED Final   Yersinia enterocolitica NOT DETECTED NOT DETECTED Final   Vibrio species NOT DETECTED NOT DETECTED Final   Vibrio cholerae NOT DETECTED NOT DETECTED Final   Enteroaggregative E coli (EAEC) NOT DETECTED NOT DETECTED Final   Enteropathogenic E coli (EPEC) NOT DETECTED NOT DETECTED Final   Enterotoxigenic E coli (ETEC) NOT DETECTED NOT DETECTED Final   Shiga like toxin producing E coli (STEC) NOT DETECTED NOT DETECTED Final   Shigella/Enteroinvasive E coli (EIEC) NOT DETECTED NOT DETECTED Final   Cryptosporidium NOT DETECTED NOT DETECTED Final   Cyclospora cayetanensis NOT DETECTED NOT DETECTED Final   Entamoeba histolytica NOT DETECTED NOT DETECTED Final   Giardia lamblia NOT DETECTED NOT DETECTED Final   Adenovirus F40/41 NOT DETECTED NOT DETECTED Final   Astrovirus NOT DETECTED NOT DETECTED Final   Norovirus GI/GII DETECTED (A) NOT DETECTED Final    Comment: RESULT CALLED TO, READ BACK BY AND VERIFIED WITH: C/REBECCA PULEO 05/20/21 1052 AMK    Rotavirus A NOT DETECTED NOT DETECTED Final   Sapovirus (I, II, IV, and V) NOT DETECTED NOT DETECTED Final    Comment: Performed at [facility], [place] [place], [place] [address]  C Difficile Quick Screen w PCR reflex     Status: Abnormal   Collection Time: 05/19/21 10:54 PM   Specimen: Stool  Result Value Ref Range Status   C Diff antigen NON REACTIVE (A) NEGATIVE Final   C Diff toxin NON REACTIVE (A) NEGATIVE Final   C Diff interpretation VALID  Final    Comment: Performed at [facility], [place] [address] [place], [place] [address]      Imaging Studies   No results found.   Medications   Scheduled Meds:  apixaban  10 mg Oral BID   Followed by   Derrill Memo ON 05/28/2021] apixaban  5  mg Oral BID   feeding supplement  1 Container Oral QID   multivitamin with minerals  1 tablet Oral Daily   nicotine  21 mg Transdermal Daily   pantoprazole  40 mg Oral Q0600   Continuous Infusions:  ceFEPime (MAXIPIME) IV 2 g (05/22/21 0955)   lactated ringers 100 mL/hr at 05/22/21 1407   metronidazole 500 mg (05/22/21 1054)       LOS: 3 days    Time spent: 30 minutes    Ezekiel Slocumb, DO [facility]  05/22/2021, 2:15 PM      If 7PM-7AM, please contact night-coverage. How to contact the [facility] Attending or Consulting provider [place] or covering provider during after hours [place], for this patient?    Check the care team in [facility] and look for a) attending/consulting [facility] provider listed and b) the [facility] team listed Log into [email or web address] and use [place]'s universal password to access. If you do not have the password, please contact the hospital operator. Locate the [facility] provider you are looking for under [facility] and page to a number that you can be directly reached. If you still have difficulty reaching the provider, please page the [facility] (Director on Call) for the Hospitalists listed on amion for assistance.

## 2021-05-22 NOTE — Progress Notes (Signed)
Called to room by OT while they were getting patient OOB to [facility] and chair, pad from bed has large amount of dark red mushy stool.  Blood in stool reported over night as well but seems to have increased.  MD made aware.

## 2021-05-23 DIAGNOSIS — G9340 Encephalopathy, unspecified: Secondary | ICD-10-CM | POA: Diagnosis not present

## 2021-05-23 MED ORDER — METOPROLOL TARTRATE 25 MG PO TABS
12.5000 mg | ORAL_TABLET | Freq: Two times a day (BID) | ORAL | Status: DC | PRN
  Administered 2021-05-23: 12.5 mg via ORAL
  Filled 2021-05-23: qty 1

## 2021-05-23 NOTE — Progress Notes (Signed)
Physical Therapy Treatment Patient Details Name: Angela Mcpherson MRN: [id number] DOB: 1960/08/30 Today's Date: 05/23/2021   History of Present Illness 60 yo female admitted with AMS, acute encephalopathy, weakness, colitis, acute thrombus abd aorta. Hx of stage IV rectal ca-chemo    PT Comments    Pt assisted with ambulating in hallway (HR elevated to 140s bpm, see mobility section below) and performed LE exercises in sitting.  Pt pleased with progress today.   Recommendations for follow up therapy are one component of a multi-disciplinary discharge planning process, led by the attending physician.  Recommendations may be updated based on patient status, additional functional criteria and insurance authorization.  Follow Up Recommendations  Home health PT;Supervision/Assistance - 24 hour     Equipment Recommendations  Rolling walker with 5" wheels (pt reports she may borrow her brothers?)    Recommendations for Other Services       Precautions / Restrictions Precautions Precautions: Fall Precaution Comments: enteric - utilized adult diaper for ambulating (just in case)     Mobility  Bed Mobility               General bed mobility comments: pt in recliner    Transfers Overall transfer level: Needs assistance Equipment used: None Transfers: Sit to/from Stand Sit to Stand: Min guard         General transfer comment: verbal cues for hand placement for self assist  Ambulation/Gait Ambulation/Gait assistance: Min guard Gait Distance (Feet): 180 Feet Assistive device: Rolling walker (2 wheeled) Gait Pattern/deviations: Step-through pattern;Decreased stride length     General Gait Details: utilized RW for more support/endurance; HR elevated to 140s per telemetry however mostly in 120-130s with ambulating (RN notified); pt also with increased work of breathing however SPO2 98% room air   Stairs             Wheelchair Mobility    Modified Rankin (Stroke  Patients Only)       Balance                                            Cognition Arousal/Alertness: Awake/alert Behavior During Therapy: WFL for tasks assessed/performed Overall Cognitive Status: Within Functional Limits for tasks assessed                                        Exercises General Exercises - Lower Extremity Ankle Circles/Pumps: AROM;Both;10 reps;Seated Heel Slides: AROM;Both;10 reps;Seated Hip ABduction/ADduction: AROM;Both;Seated;10 reps Hip Flexion/Marching: AROM;Both;Seated;10 reps    General Comments        Pertinent Vitals/Pain Pain Assessment: No/denies pain    Home Living                      Prior Function            PT Goals (current goals can now be found in the care plan section) Progress towards PT goals: Progressing toward goals    Frequency    Min 3X/week      PT Plan Current plan remains appropriate    Co-evaluation              AM-PAC PT "6 Clicks" Mobility   Outcome Measure  Help needed turning from your back to your side while in a flat bed without using bedrails?:  A Little Help needed moving from lying on your back to sitting on the side of a flat bed without using bedrails?: A Little Help needed moving to and from a bed to a chair (including a wheelchair)?: A Little Help needed standing up from a chair using your arms (e.g., wheelchair or bedside chair)?: A Little Help needed to walk in hospital room?: A Little Help needed climbing 3-5 steps with a railing? : A Lot 6 Click Score: 17    End of Session Equipment Utilized During Treatment: Gait belt Activity Tolerance: Patient tolerated treatment well Patient left: in chair;with call bell/phone within reach;with chair alarm set Nurse Communication: Mobility status PT Visit Diagnosis: Muscle weakness (generalized) (M62.81);Difficulty in walking, not elsewhere classified (R26.2)     Time: 7121-9758 PT Time  Calculation (min) (ACUTE ONLY): 13 min  Charges:  $Gait Training: 8-22 mins                    Jannette Spanner PT, DPT Acute Rehabilitation Services Pager: [phone] Office: [place] 05/23/2021, 4:07 PM

## 2021-05-23 NOTE — Progress Notes (Signed)
PROGRESS NOTE    Angela Mcpherson   JFH:[id number]  DOB: [birth year]  PCP: Truitt Merle, MD    DOA: 05/18/2021 LOS: 4    Brief Narrative / Hospital Course to Date:   60 year old female with past medical history of stage IV rectal cancer presented to the ED with altered mental status, generalized weakness and watery diarrhea for about a week.  Husband reported with progressively worsening confusion, poor p.o. intake secondary to nausea vomiting at home.  Evaluation in the ED showed leukocytosis and labs consistent with dehydration.  CT abdomen pelvis with contrast showed a noncalcified thrombus in the infrarenal abdominal aorta causing severe stenosis signs of infectious versus inflammatory enterocolitis (possibly ischemic due to thrombus), small volume ascites, decreased size of rectosigmoid mass and previously identified liver lesions.  Stool studies subsequently positive for norovirus.  Assessment & Plan   Principal Problem:   Acute encephalopathy Active Problems:   Malignant neoplasm of rectosigmoid junction (HCC)   Colitis   Dehydration   Aortic mural thrombus (HCC)   Thrombocytopenia (HCC)   Protein-calorie malnutrition, severe   Gastroenteritis due to norovirus   Acute metabolic encephalopathy -due to acute infectious colitis and dehydration.  Ammonia only slightly elevated at 38.  CT head was negative. Clinically improving with supportive care and empiric antibiotics (cefepime and Flagyl). --Blood cultures negative x3 days, stop antibiotics and monitor clinically --Supportive care: Antiemetics, IV fluids, pain control  Infrarenal abdominal aortic mural thrombus -patient has been transitioned off heparin infusion to Eliquis. "Scans reviewed by vascular surgeon, Dr.Brabham who noted that patient does have palpable pedal pulses, no vascular intervention recommended at this time and her current condition with preservation of palpable pedal pulses and no evidence of embolic disease  is felt patient does not require open surgery or percutaneous stenting." --Hematology/oncology following, see their recommendations. --Continue Eliquis  Acute colitis due to norovirus gastroenteritis -CT of abdomen and pelvis showed wall thickening of the ascending colon, cecum, ileum.  Loose watery stools reported at home prior to admission.  C. difficile negative.  Treated with empiric cefepime and Flagyl. --Stop IV fluids, p.o. intake improved --Stop antibiotics and monitor  Melena/lower GI bleeding -reported by bedside RN this morning 10/5.  Most likely due to either the rectosigmoid mass more likely than enterocolitis. 10/6: Bedside RN reports brown stool, bleeding appears from ulcerations. --Transfuse if hemoglobin drops less than 7  Hypokalemia -due to GI losses.  Monitor replace as needed.  Sinus tachycardia -most likely due to dehydration, colitis and just acute illness.   Has been on IV fluids -stop now that oral intake improved.   Metoprolol as needed if HR sustaining over 110.  Dehydration -present on admission, due to GI losses and poor p.o. intake.  Improved with IV hydration and supportive care. --Stop IV fluids and monitor  Thrombocytopenia -felt due to consumption of platelets given large aortic mural thrombus.   Platelet trend: 82>> 43>> 63>> 51.  Metastatic stage IV colon cancer -oncology is following, chemotherapy on hold.  Supportive care monitor.    Patient BMI: Body mass index is 18.85 kg/m.   DVT prophylaxis:  apixaban (ELIQUIS) tablet 10 mg  apixaban (ELIQUIS) tablet 5 mg   Diet:  Diet Orders (From admission, onward)     Start     Ordered   05/21/21 0756  DIET SOFT Room service appropriate? Yes; Fluid consistency: Thin  Diet effective now       Question Answer Comment  Room service appropriate? Yes   Fluid consistency:  Thin      05/21/21 0755              Code Status: Full Code   Subjective 05/23/21    Patient up in recliner when seen on  rounds today.  She reports feeling better, eating well now that she is ordering food she prefers.  Denies any abdominal pain nausea or vomiting today.  Has had a small amount of brown stool.  She denies acute complaints.  Very hopeful to go home tomorrow.   Disposition Plan & Communication   Status is: Inpatient  Remains inpatient appropriate because:IV treatments appropriate due to intensity of illness or inability to take PO  Dispo:  Patient From: Home  Planned Disposition: Home with Health Care Svc  Medically stable for discharge: No     Consults, Procedures, Significant Events   Consultants:  Hematology-oncology  Procedures:  None  Antimicrobials:  Anti-infectives (From admission, onward)    Start     Dose/Rate Route Frequency Ordered Stop   05/20/21 1000  ceFEPIme (MAXIPIME) 2 g in sodium chloride 0.9 % 100 mL IVPB  Status:  Discontinued        2 g 200 mL/hr over 30 Minutes Intravenous Every 8 hours 05/20/21 0919 05/22/21 1428   05/19/21 1000  metroNIDAZOLE (FLAGYL) IVPB 500 mg  Status:  Discontinued        500 mg 100 mL/hr over 60 Minutes Intravenous Every 12 hours 05/18/21 2327 05/22/21 1428   05/18/21 2359  ceFEPIme (MAXIPIME) 2 g in sodium chloride 0.9 % 100 mL IVPB  Status:  Discontinued        2 g 200 mL/hr over 30 Minutes Intravenous Every 12 hours 05/18/21 2332 05/20/21 0919   05/18/21 2015  metroNIDAZOLE (FLAGYL) IVPB 500 mg        500 mg 100 mL/hr over 60 Minutes Intravenous  Once 05/18/21 2013 05/18/21 2234   05/18/21 1815  cefTRIAXone (ROCEPHIN) 1 g in sodium chloride 0.9 % 100 mL IVPB        1 g 200 mL/hr over 30 Minutes Intravenous  Once 05/18/21 1805 05/18/21 2033         Micro    Objective   Vitals:   05/22/21 1147 05/22/21 2003 05/23/21 0456 05/23/21 1210  BP: 106/65 99/78 109/71 102/75  Pulse: (!) 110 (!) 121 (!) 109 (!) 113  Resp: 18 18 16 18   Temp: (!) 97.5 F (36.4 C) 97.8 F (36.6 C) 98.5 F (36.9 C) 97.6 F (36.4 C)  TempSrc:  Tympanic Oral Oral Oral  SpO2: 97% 95% 94% 95%  Weight:      Height:        Intake/Output Summary (Last 24 hours) at 05/23/2021 1603 Last data filed at 05/23/2021 1300 Gross per 24 hour  Intake 2663.86 ml  Output --  Net 2663.86 ml   Filed Weights   05/18/21 2044 05/19/21 1450  Weight: 44.5 kg 49.8 kg    Physical Exam:  General exam: awake, alert, no acute distress, frail, chronically ill-appearing Respiratory system: normal respiratory effort, on room air. Cardiovascular system: normal S1/S2, RRR, no pedal edema.   Gastrointestinal system: soft, nontender, nondistended. Central nervous system: A&O x3. no gross focal neurologic deficits, normal speech Psychiatry: normal mood, congruent affect, judgement and insight appear normal  Labs   Data Reviewed: I have personally reviewed following labs and imaging studies  CBC: Recent Labs  Lab 05/18/21 1750 05/19/21 0618 05/20/21 0400 05/21/21 0358 05/22/21 0354 05/22/21 1451  WBC 22.7*  20.9* 26.9* 21.6* 24.4*  --   NEUTROABS 14.1*  --   --   --  20.5*  --   HGB 17.3* 14.2 15.6* 13.4 11.7* 12.0  HCT 46.9* 39.7 43.5 38.0 32.9* 33.7*  MCV 91.6 94.5 94.6 95.7 96.2  --   PLT 92* 82* 43* 63* 51*  --    Basic Metabolic Panel: Recent Labs  Lab 05/18/21 1750 05/19/21 0618 05/19/21 0921 05/20/21 0400 05/21/21 0358 05/22/21 0354  NA 129* 132*  --  130* 132* 130*  K 5.0 4.5  --  3.5 3.0* 3.6  CL 93* 98  --  102 104 105  CO2 28 27  --  22 22 18*  GLUCOSE 132* 112*  --  141* 168* 146*  BUN 49* 35*  --  34* 27* 24*  CREATININE 0.81 0.67  --  0.61 0.63 0.64  CALCIUM 9.8 8.6*  --  8.0* 8.0* 7.9*  MG 2.3  --  1.9 1.7 2.3 2.0   GFR: Estimated Creatinine Clearance: 59.5 mL/min (by C-G formula based on SCr of 0.64 mg/dL). Liver Function Tests: Recent Labs  Lab 05/18/21 1750 05/19/21 0618  AST 35 27  ALT 31 26  ALKPHOS 102 73  BILITOT 1.4* 0.8  PROT 6.0* 4.5*  ALBUMIN 3.0* 2.2*   No results for input(s): LIPASE, AMYLASE  in the last 168 hours. Recent Labs  Lab 05/18/21 1751  AMMONIA 38*   Coagulation Profile: Recent Labs  Lab 05/18/21 2044 05/19/21 0618  INR 1.3* 1.5*   Cardiac Enzymes: No results for input(s): CKTOTAL, CKMB, CKMBINDEX, TROPONINI in the last 168 hours. BNP (last 3 results) No results for input(s): PROBNP in the last 8760 hours. HbA1C: No results for input(s): HGBA1C in the last 72 hours. CBG: Recent Labs  Lab 05/21/21 2108  GLUCAP 135*   Lipid Profile: No results for input(s): CHOL, HDL, LDLCALC, TRIG, CHOLHDL, LDLDIRECT in the last 72 hours. Thyroid Function Tests: No results for input(s): TSH, T4TOTAL, FREET4, T3FREE, THYROIDAB in the last 72 hours. Anemia Panel: No results for input(s): VITAMINB12, FOLATE, FERRITIN, TIBC, IRON, RETICCTPCT in the last 72 hours. Sepsis Labs: Recent Labs  Lab 05/18/21 1903 05/18/21 2039 05/18/21 2044 05/19/21 0007 05/19/21 0251  PROCALCITON  --   --  0.77  --   --   LATICACIDVEN 2.1* 2.8*  --  3.2* 1.9    Recent Results (from the past 240 hour(s))  Resp Panel by RT-PCR (Flu A&B, Covid) Nasopharyngeal Swab     Status: None   Collection Time: 05/18/21  5:50 PM   Specimen: Nasopharyngeal Swab; Nasopharyngeal(NP) swabs in vial transport medium  Result Value Ref Range Status   SARS Coronavirus 2 by RT PCR NEGATIVE NEGATIVE Final    Comment: (NOTE) SARS-CoV-2 target nucleic acids are NOT DETECTED.  The SARS-CoV-2 RNA is generally detectable in upper respiratory specimens during the acute phase of infection. The lowest concentration of SARS-CoV-2 viral copies this assay can detect is 138 copies/mL. A negative result does not preclude SARS-Cov-2 infection and should not be used as the sole basis for treatment or other patient management decisions. A negative result may occur with  improper specimen collection/handling, submission of specimen other than nasopharyngeal swab, presence of viral mutation(s) within the areas targeted by  this assay, and inadequate number of viral copies(<138 copies/mL). A negative result must be combined with clinical observations, patient history, and epidemiological information. The expected result is Negative.  Fact Sheet for Patients:  EntrepreneurPulse.com.au  Fact Sheet  for Healthcare Providers:  IncredibleEmployment.be  This test is no t yet approved or cleared by the [place] and  has been authorized for detection and/or diagnosis of SARS-CoV-2 by FDA under an Emergency Use Authorization (EUA). This EUA will remain  in effect (meaning this test can be used) for the duration of the COVID-19 declaration under Section 564(b)(1) of the Act, 21 U.S.C.section 360bbb-3(b)(1), unless the authorization is terminated  or revoked sooner.       Influenza A by PCR NEGATIVE NEGATIVE Final   Influenza B by PCR NEGATIVE NEGATIVE Final    Comment: (NOTE) The Xpert Xpress SARS-CoV-2/FLU/RSV plus assay is intended as an aid in the diagnosis of influenza from Nasopharyngeal swab specimens and should not be used as a sole basis for treatment. Nasal washings and aspirates are unacceptable for Xpert Xpress SARS-CoV-2/FLU/RSV testing.  Fact Sheet for Patients: EntrepreneurPulse.com.au  Fact Sheet for Healthcare Providers: IncredibleEmployment.be  This test is not yet approved or cleared by the [place] FDA and has been authorized for detection and/or diagnosis of SARS-CoV-2 by FDA under an Emergency Use Authorization (EUA). This EUA will remain in effect (meaning this test can be used) for the duration of the COVID-19 declaration under Section 564(b)(1) of the Act, 21 U.S.C. section 360bbb-3(b)(1), unless the authorization is terminated or revoked.  Performed at [facility], [place] [address], [place], [place] [address]   Blood culture (routine x 2)     Status: None (Preliminary result)    Collection Time: 05/18/21  7:02 PM   Specimen: BLOOD  Result Value Ref Range Status   Specimen Description   Final    BLOOD LEFT ANTECUBITAL Performed at [facility] [address] [place], [place] [address]    Special Requests   Final    BOTTLES DRAWN AEROBIC AND ANAEROBIC Blood Culture adequate volume Performed at [facility] [address] [place], [place] [address]    Culture   Final    NO GROWTH 4 DAYS Performed at [facility], [place] [address], [place], [place] [address]    Report Status PENDING  Incomplete  Blood culture (routine x 2)     Status: None (Preliminary result)   Collection Time: 05/18/21  7:08 PM   Specimen: BLOOD  Result Value Ref Range Status   Specimen Description   Final    BLOOD RIGHT ANTECUBITAL Performed at [facility] [address] [place], [place] [address]    Special Requests   Final    BOTTLES DRAWN AEROBIC AND ANAEROBIC Blood Culture adequate volume Performed at [facility] [address] [place], [place] [address]    Culture   Final    NO GROWTH 4 DAYS Performed at [facility], [place] [address], [place], [place] [address]    Report Status PENDING  Incomplete  Urine Culture     Status: None   Collection Time: 05/18/21  9:01 PM   Specimen: Urine, Catheterized  Result Value Ref Range Status   Specimen Description   Final    URINE, CATHETERIZED Performed at [facility] [address], [place], [place] [address]    Special Requests   Final    NONE Performed at [facility], [place] [address], [place], [place] [address]    Culture   Final    NO GROWTH Performed at [facility], [place] [address], [place], [place] [address]  Report Status 05/20/2021 FINAL  Final  Gastrointestinal Panel by PCR , Stool     Status: Abnormal   Collection Time: 05/19/21 10:54 PM   Specimen: Stool  Result Value Ref Range  Status   Campylobacter species NOT DETECTED NOT DETECTED Final   Plesimonas shigelloides NOT DETECTED NOT DETECTED Final   Salmonella species NOT DETECTED NOT DETECTED Final   Yersinia enterocolitica NOT DETECTED NOT DETECTED Final   Vibrio species NOT DETECTED NOT DETECTED Final   Vibrio cholerae NOT DETECTED NOT DETECTED Final   Enteroaggregative E coli (EAEC) NOT DETECTED NOT DETECTED Final   Enteropathogenic E coli (EPEC) NOT DETECTED NOT DETECTED Final   Enterotoxigenic E coli (ETEC) NOT DETECTED NOT DETECTED Final   Shiga like toxin producing E coli (STEC) NOT DETECTED NOT DETECTED Final   Shigella/Enteroinvasive E coli (EIEC) NOT DETECTED NOT DETECTED Final   Cryptosporidium NOT DETECTED NOT DETECTED Final   Cyclospora cayetanensis NOT DETECTED NOT DETECTED Final   Entamoeba histolytica NOT DETECTED NOT DETECTED Final   Giardia lamblia NOT DETECTED NOT DETECTED Final   Adenovirus F40/41 NOT DETECTED NOT DETECTED Final   Astrovirus NOT DETECTED NOT DETECTED Final   Norovirus GI/GII DETECTED (A) NOT DETECTED Final    Comment: RESULT CALLED TO, READ BACK BY AND VERIFIED WITH: C/REBECCA PULEO 05/20/21 1052 AMK    Rotavirus A NOT DETECTED NOT DETECTED Final   Sapovirus (I, II, IV, and V) NOT DETECTED NOT DETECTED Final    Comment: Performed at [facility], [place] [place], [place] [address]  C Difficile Quick Screen w PCR reflex     Status: Abnormal   Collection Time: 05/19/21 10:54 PM   Specimen: Stool  Result Value Ref Range Status   C Diff antigen NON REACTIVE (A) NEGATIVE Final   C Diff toxin NON REACTIVE (A) NEGATIVE Final   C Diff interpretation VALID  Final    Comment: Performed at [facility], [place] [address], [place], [place] [address]      Imaging Studies   No results found.   Medications   Scheduled Meds:  apixaban  10 mg Oral BID   Followed by   Derrill Memo ON 05/28/2021] apixaban  5 mg Oral BID   feeding supplement  1  Container Oral QID   multivitamin with minerals  1 tablet Oral Daily   nicotine  21 mg Transdermal Daily   pantoprazole  40 mg Oral Q0600   Continuous Infusions:       LOS: 4 days    Time spent: 30 minutes with > 50% spent at bedside and in coordination of care     Ezekiel Slocumb, DO [facility]  05/23/2021, 4:03 PM      If 7PM-7AM, please contact night-coverage. How to contact the [facility] Attending or Consulting provider [place] or covering provider during after hours [place], for this patient?    Check the care team in [facility] and look for a) attending/consulting [facility] provider listed and b) the [facility] team listed Log into [email or web address] and use [place]'s universal password to access. If you do not have the password, please contact the hospital operator. Locate the [facility] provider you are looking for under [facility] and page to a number that you can be directly reached. If you still have difficulty reaching the provider, please page the [facility] (Director on Call) for the Hospitalists listed on amion for assistance.

## 2021-05-23 NOTE — Progress Notes (Addendum)
Angela Mcpherson   DOB:June 07, 1961   VH#:[id number]   WUX#:[id number]  Hem/Onc follow up note   Subjective: Patient is sitting up in the recliner today.  She is much more alert and oriented.  Able to answer all questions.  Continues to have intermittent diarrhea but overall improved.  GI panel consistent with norovirus.  Objective:  Vitals:   05/22/21 2003 05/23/21 0456  BP: 99/78 109/71  Pulse: (!) 121 (!) 109  Resp: 18 16  Temp: 97.8 F (36.6 C) 98.5 F (36.9 C)  SpO2: 95% 94%    Body mass index is 18.85 kg/m.  Intake/Output Summary (Last 24 hours) at 05/23/2021 1148 Last data filed at 05/23/2021 0847 Gross per 24 hour  Intake 2543.86 ml  Output --  Net 2543.86 ml     Sclerae unicteric  No peripheral adenopathy  Lungs clear -- no rales or rhonchi  Heart regular rate and rhythm  Abdomen benign  MSK no focal spinal tenderness, no peripheral edema  Neuro nonfocal    CBG (last 3)  Recent Labs    05/21/21 2108  GLUCAP 135*     Labs:   Urine Studies No results for input(s): UHGB, CRYS in the last 72 hours.  Invalid input(s): UACOL, UAPR, USPG, UPH, UTP, UGL, UKET, UBIL, UNIT, UROB, [place], UEPI, UWBC, [place], [place], [place], [place], [place]  Basic Metabolic Panel: Recent Labs  Lab 05/18/21 1750 05/19/21 0618 05/19/21 0921 05/20/21 0400 05/21/21 0358 05/22/21 0354  NA 129* 132*  --  130* 132* 130*  K 5.0 4.5  --  3.5 3.0* 3.6  CL 93* 98  --  102 104 105  CO2 28 27  --  22 22 18*  GLUCOSE 132* 112*  --  141* 168* 146*  BUN 49* 35*  --  34* 27* 24*  CREATININE 0.81 0.67  --  0.61 0.63 0.64  CALCIUM 9.8 8.6*  --  8.0* 8.0* 7.9*  MG 2.3  --  1.9 1.7 2.3 2.0   GFR Estimated Creatinine Clearance: 59.5 mL/min (by C-G formula based on SCr of 0.64 mg/dL). Liver Function Tests: Recent Labs  Lab 05/18/21 1750 05/19/21 0618  AST 35 27  ALT 31 26  ALKPHOS 102 73  BILITOT 1.4* 0.8  PROT 6.0* 4.5*  ALBUMIN 3.0* 2.2*   No results for input(s): LIPASE, AMYLASE in the last 168  hours. Recent Labs  Lab 05/18/21 1751  AMMONIA 38*   Coagulation profile Recent Labs  Lab 05/18/21 2044 05/19/21 0618  INR 1.3* 1.5*    CBC: Recent Labs  Lab 05/18/21 1750 05/19/21 0618 05/20/21 0400 05/21/21 0358 05/22/21 0354 05/22/21 1451  WBC 22.7* 20.9* 26.9* 21.6* 24.4*  --   NEUTROABS 14.1*  --   --   --  20.5*  --   HGB 17.3* 14.2 15.6* 13.4 11.7* 12.0  HCT 46.9* 39.7 43.5 38.0 32.9* 33.7*  MCV 91.6 94.5 94.6 95.7 96.2  --   PLT 92* 82* 43* 63* 51*  --    Cardiac Enzymes: No results for input(s): CKTOTAL, CKMB, CKMBINDEX, TROPONINI in the last 168 hours. BNP: Invalid input(s): POCBNP CBG: Recent Labs  Lab 05/21/21 2108  GLUCAP 135*   D-Dimer No results for input(s): DDIMER in the last 72 hours.  Hgb A1c No results for input(s): HGBA1C in the last 72 hours. Lipid Profile No results for input(s): CHOL, HDL, LDLCALC, TRIG, CHOLHDL, LDLDIRECT in the last 72 hours. Thyroid function studies No results for input(s): TSH, T4TOTAL, T3FREE, THYROIDAB in the  last 72 hours.  Invalid input(s): FREET3  Anemia work up No results for input(s): VITAMINB12, FOLATE, FERRITIN, TIBC, IRON, RETICCTPCT in the last 72 hours. Microbiology Recent Results (from the past 240 hour(s))  Resp Panel by RT-PCR (Flu A&B, Covid) Nasopharyngeal Swab     Status: None   Collection Time: 05/18/21  5:50 PM   Specimen: Nasopharyngeal Swab; Nasopharyngeal(NP) swabs in vial transport medium  Result Value Ref Range Status   SARS Coronavirus 2 by RT PCR NEGATIVE NEGATIVE Final    Comment: (NOTE) SARS-CoV-2 target nucleic acids are NOT DETECTED.  The SARS-CoV-2 RNA is generally detectable in upper respiratory specimens during the acute phase of infection. The lowest concentration of SARS-CoV-2 viral copies this assay can detect is 138 copies/mL. A negative result does not preclude SARS-Cov-2 infection and should not be used as the sole basis for treatment or other patient management  decisions. A negative result may occur with  improper specimen collection/handling, submission of specimen other than nasopharyngeal swab, presence of viral mutation(s) within the areas targeted by this assay, and inadequate number of viral copies(<138 copies/mL). A negative result must be combined with clinical observations, patient history, and epidemiological information. The expected result is Negative.  Fact Sheet for Patients:  EntrepreneurPulse.com.au  Fact Sheet for Healthcare Providers:  IncredibleEmployment.be  This test is no t yet approved or cleared by the [place] FDA and  has been authorized for detection and/or diagnosis of SARS-CoV-2 by FDA under an Emergency Use Authorization (EUA). This EUA will remain  in effect (meaning this test can be used) for the duration of the COVID-19 declaration under Section 564(b)(1) of the Act, 21 U.S.C.section 360bbb-3(b)(1), unless the authorization is terminated  or revoked sooner.       Influenza A by PCR NEGATIVE NEGATIVE Final   Influenza B by PCR NEGATIVE NEGATIVE Final    Comment: (NOTE) The Xpert Xpress SARS-CoV-2/FLU/RSV plus assay is intended as an aid in the diagnosis of influenza from Nasopharyngeal swab specimens and should not be used as a sole basis for treatment. Nasal washings and aspirates are unacceptable for Xpert Xpress SARS-CoV-2/FLU/RSV testing.  Fact Sheet for Patients: EntrepreneurPulse.com.au  Fact Sheet for Healthcare Providers: IncredibleEmployment.be  This test is not yet approved or cleared by the [place] FDA and has been authorized for detection and/or diagnosis of SARS-CoV-2 by FDA under an Emergency Use Authorization (EUA). This EUA will remain in effect (meaning this test can be used) for the duration of the COVID-19 declaration under Section 564(b)(1) of the Act, 21 U.S.C. section 360bbb-3(b)(1), unless the  authorization is terminated or revoked.  Performed at [facility], [place] [address] [place], [place] [address]   Blood culture (routine x 2)     Status: None (Preliminary result)   Collection Time: 05/18/21  7:02 PM   Specimen: BLOOD  Result Value Ref Range Status   Specimen Description   Final    BLOOD LEFT ANTECUBITAL Performed at [facility] [address] [place], [place] [address]    Special Requests   Final    BOTTLES DRAWN AEROBIC AND ANAEROBIC Blood Culture adequate volume Performed at [facility] [address] [place], [place] [address]    Culture   Final    NO GROWTH 4 DAYS Performed at [facility], [place] [address], [place], [place] [address]    Report Status PENDING  Incomplete  Blood culture (routine x 2)     Status: None (Preliminary  result)   Collection Time: 05/18/21  7:08 PM   Specimen: BLOOD  Result Value Ref Range Status   Specimen Description   Final    BLOOD RIGHT ANTECUBITAL Performed at [facility] [address] [place], [place] [address]    Special Requests   Final    BOTTLES DRAWN AEROBIC AND ANAEROBIC Blood Culture adequate volume Performed at [facility] [address] [place], [place] [address]    Culture   Final    NO GROWTH 4 DAYS Performed at [facility], [place] [address], [place], [place] [address]    Report Status PENDING  Incomplete  Urine Culture     Status: None   Collection Time: 05/18/21  9:01 PM   Specimen: Urine, Catheterized  Result Value Ref Range Status   Specimen Description   Final    URINE, CATHETERIZED Performed at [facility] [address] [place], [place] [address]    Special Requests   Final    NONE Performed at [facility], [place] [address], [place], [place] [address]    Culture   Final    NO GROWTH Performed at [facility], [place] [address],  [place], [place] [address]    Report Status 05/20/2021 FINAL  Final  Gastrointestinal Panel by PCR , Stool     Status: Abnormal   Collection Time: 05/19/21 10:54 PM   Specimen: Stool  Result Value Ref Range Status   Campylobacter species NOT DETECTED NOT DETECTED Final   Plesimonas shigelloides NOT DETECTED NOT DETECTED Final   Salmonella species NOT DETECTED NOT DETECTED Final   Yersinia enterocolitica NOT DETECTED NOT DETECTED Final   Vibrio species NOT DETECTED NOT DETECTED Final   Vibrio cholerae NOT DETECTED NOT DETECTED Final   Enteroaggregative E coli (EAEC) NOT DETECTED NOT DETECTED Final   Enteropathogenic E coli (EPEC) NOT DETECTED NOT DETECTED Final   Enterotoxigenic E coli (ETEC) NOT DETECTED NOT DETECTED Final   Shiga like toxin producing E coli (STEC) NOT DETECTED NOT DETECTED Final   Shigella/Enteroinvasive E coli (EIEC) NOT DETECTED NOT DETECTED Final   Cryptosporidium NOT DETECTED NOT DETECTED Final   Cyclospora cayetanensis NOT DETECTED NOT DETECTED Final   Entamoeba histolytica NOT DETECTED NOT DETECTED Final   Giardia lamblia NOT DETECTED NOT DETECTED Final   Adenovirus F40/41 NOT DETECTED NOT DETECTED Final   Astrovirus NOT DETECTED NOT DETECTED Final   Norovirus GI/GII DETECTED (A) NOT DETECTED Final    Comment: RESULT CALLED TO, READ BACK BY AND VERIFIED WITH: C/REBECCA PULEO 05/20/21 1052 AMK    Rotavirus A NOT DETECTED NOT DETECTED Final   Sapovirus (I, II, IV, and V) NOT DETECTED NOT DETECTED Final    Comment: Performed at [facility], [address] [place], [place] [address]  C Difficile Quick Screen w PCR reflex     Status: Abnormal   Collection Time: 05/19/21 10:54 PM   Specimen: Stool  Result Value Ref Range Status   C Diff antigen NON REACTIVE (A) NEGATIVE Final   C Diff toxin NON REACTIVE (A) NEGATIVE Final   C Diff interpretation VALID  Final    Comment: Performed at [facility], [place] [address], [place],   [address]      Studies:  No results found.  Assessment: 60 y.o.  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  Now off antibiotics.  Management of diarrhea per hospitalist. -Thrombocytopenia stable as of labs checked 10/5.  She has been transition to Eliquis. -We will continue to hold chemotherapy.  We will get her rescheduled in our office when she recovers from her acute illness.  Mikey Bussing, NP 05/23/2021  11:48 AM  ADDENDUM  I have seen the patient, examined her. I agree with the assessment and and plan and have edited the notes.   Mrs Gentz is doing much better today.  She will discharge home tomorrow.  Diarrhea is much improved, she is eating well.  Lab reviewed.  She is tolerating Eliquis well, will continue after discharge.  I plan to see her back in my clinic on October 17, I will send a scheduled message to set up. Will hold on her chemo for now.   Truitt Merle 05/23/2021

## 2021-05-24 ENCOUNTER — Telehealth: Payer: Self-pay | Admitting: Hematology

## 2021-05-24 DIAGNOSIS — G9340 Encephalopathy, unspecified: Secondary | ICD-10-CM | POA: Diagnosis not present

## 2021-05-24 LAB — BASIC METABOLIC PANEL
Anion gap: 8 (ref 5–15)
Anion gap: 6 (ref 5–15)
BUN: 27 mg/dL — ABNORMAL HIGH (ref 6–20)
BUN: 29 mg/dL — ABNORMAL HIGH (ref 6–20)
CO2: 15 mmol/L — ABNORMAL LOW (ref 22–32)
CO2: 17 mmol/L — ABNORMAL LOW (ref 22–32)
Calcium: 7.6 mg/dL — ABNORMAL LOW (ref 8.9–10.3)
Calcium: 7.6 mg/dL — ABNORMAL LOW (ref 8.9–10.3)
Chloride: 101 mmol/L (ref 98–111)
Chloride: 103 mmol/L (ref 98–111)
Creatinine, Ser: 0.59 mg/dL (ref 0.44–1.00)
Creatinine, Ser: 0.72 mg/dL (ref 0.44–1.00)
GFR, Estimated: >60 mL/min (ref >60)
GFR, Estimated: >60 mL/min (ref >60)
Glucose, Bld: 117 mg/dL — ABNORMAL HIGH (ref 70–99)
Glucose, Bld: 112 mg/dL — ABNORMAL HIGH (ref 70–99)
Potassium: 3.8 mmol/L (ref 3.5–5.1)
Potassium: 3.8 mmol/L (ref 3.5–5.1)
Sodium: 124 mmol/L — ABNORMAL LOW (ref 135–145)
Sodium: 126 mmol/L — ABNORMAL LOW (ref 135–145)

## 2021-05-24 LAB — CULTURE, BLOOD (ROUTINE X 2)
Culture: NO GROWTH
Culture: NO GROWTH
Special Requests: ADEQUATE
Special Requests: ADEQUATE

## 2021-05-24 LAB — BLOOD GAS, VENOUS
Acid-base deficit: 6.6 mmol/L — ABNORMAL HIGH (ref 0.0–2.0)
Bicarbonate: 15.9 mmol/L — ABNORMAL LOW (ref 20.0–28.0)
FIO2: 21
O2 Saturation: 87.2 %
Patient temperature: 98.6
pCO2, Ven: 24.1 mmHg — ABNORMAL LOW (ref 44.0–60.0)
pH, Ven: 7.435 — ABNORMAL HIGH (ref 7.250–7.430)
pO2, Ven: 49.9 mmHg — ABNORMAL HIGH (ref 32.0–45.0)

## 2021-05-24 LAB — CORTISOL: Cortisol, Plasma: 22 ug/dL

## 2021-05-24 LAB — OSMOLALITY: Osmolality: 265 mOsm/kg — ABNORMAL LOW (ref 275–295)

## 2021-05-24 LAB — CBC
HCT: 28.3 % — ABNORMAL LOW (ref 36.0–46.0)
Hemoglobin: 10.3 g/dL — ABNORMAL LOW (ref 12.0–15.0)
MCH: 34.1 pg — ABNORMAL HIGH (ref 26.0–34.0)
MCHC: 36.4 g/dL — ABNORMAL HIGH (ref 30.0–36.0)
MCV: 93.7 fL (ref 80.0–100.0)
Platelets: 81 10*3/uL — ABNORMAL LOW (ref 150–400)
RBC: 3.02 MIL/uL — ABNORMAL LOW (ref 3.87–5.11)
RDW: 20.3 % — ABNORMAL HIGH (ref 11.5–15.5)
WBC: 23 10*3/uL — ABNORMAL HIGH (ref 4.0–10.5)
nRBC: 0 % (ref 0.0–0.2)

## 2021-05-24 LAB — MAGNESIUM: Magnesium: 1.6 mg/dL — ABNORMAL LOW (ref 1.7–2.4)

## 2021-05-24 LAB — SODIUM, URINE, RANDOM: Sodium, Ur: <10 mmol/L

## 2021-05-24 MED ORDER — ALUM & MAG HYDROXIDE-SIMETH 200-200-20 MG/5ML PO SUSP
30.0000 mL | ORAL | Status: DC | PRN
  Administered 2021-05-25: 30 mL via ORAL
  Filled 2021-05-24: qty 30

## 2021-05-24 MED ORDER — SODIUM CHLORIDE 0.9 % IV SOLN: INTRAVENOUS | Status: DC

## 2021-05-24 MED ORDER — MAGNESIUM SULFATE 2 GM/50ML IV SOLN
2.0000 g | Freq: Once | INTRAVENOUS | Status: AC
  Administered 2021-05-24: 2 g via INTRAVENOUS
  Filled 2021-05-24: qty 50

## 2021-05-24 MED ORDER — SODIUM CHLORIDE 0.9 % IV BOLUS
500.0000 mL | Freq: Once | INTRAVENOUS | Status: AC
  Administered 2021-05-24: 500 mL via INTRAVENOUS

## 2021-05-24 NOTE — Telephone Encounter (Signed)
Scheduled per sch msg. Nurse will give patient printout upon discharge

## 2021-05-24 NOTE — Progress Notes (Signed)
PROGRESS NOTE    Angela Mcpherson   JXB:[id number]  DOB: October 26, 1960  PCP: Truitt Merle, MD    DOA: 05/18/2021 LOS: 5    Brief Narrative / Hospital Course to Date:   60 year old female with past medical history of stage IV rectal cancer presented to the ED with altered mental status, generalized weakness and watery diarrhea for about a week.  Husband reported with progressively worsening confusion, poor p.o. intake secondary to nausea vomiting at home.  Evaluation in the ED showed leukocytosis and labs consistent with dehydration.  CT abdomen pelvis with contrast showed a noncalcified thrombus in the infrarenal abdominal aorta causing severe stenosis signs of infectious versus inflammatory enterocolitis (possibly ischemic due to thrombus), small volume ascites, decreased size of rectosigmoid mass and previously identified liver lesions.  Stool studies subsequently positive for norovirus.  Assessment & Plan   Principal Problem:   Acute encephalopathy Active Problems:   Malignant neoplasm of rectosigmoid junction (HCC)   Colitis   Dehydration   Aortic mural thrombus (HCC)   Thrombocytopenia (HCC)   Protein-calorie malnutrition, severe   Gastroenteritis due to norovirus   Acute metabolic encephalopathy -due to acute infectious colitis and dehydration.  Ammonia only slightly elevated at 38.  CT head was negative. Clinically improving with supportive care and empiric antibiotics (cefepime and Flagyl). --Blood cultures negative x3 days, stop antibiotics and monitor clinically --Supportive care: Antiemetics, IV fluids, pain control  Hypotonic hyponatremia -10/7: Sodium dropped from 1 30-1 24 today 10/7.  Likely in the setting of GI losses with recurrent diarrhea.  Also has non-anion gap metabolic acidosis which would also be consistent with this.   --500 cc bolus NS followed by 100 cc/h --Recheck BMP this afternoon --Give Imodium for diarrhea  Infrarenal abdominal aortic mural thrombus  -patient has been transitioned off heparin infusion to Eliquis. "Scans reviewed by vascular surgeon, Dr.Brabham who noted that patient does have palpable pedal pulses, no vascular intervention recommended at this time and her current condition with preservation of palpable pedal pulses and no evidence of embolic disease is felt patient does not require open surgery or percutaneous stenting." --Hematology/oncology following, see their recommendations. --Continue Eliquis  Acute colitis due to norovirus gastroenteritis -CT of abdomen and pelvis showed wall thickening of the ascending colon, cecum, ileum.  Loose watery stools reported at home prior to admission.  C. difficile negative.  Treated with empiric cefepime and Flagyl. --Stop IV fluids, p.o. intake improved --Stop antibiotics and monitor  Melena/lower GI bleeding -reported by bedside RN this morning 10/5.  Most likely due to either the rectosigmoid mass more likely than enterocolitis. 10/6: Bedside RN reports brown stool, bleeding appears from ulcerations. --Transfuse if hemoglobin drops less than 7  Hypokalemia -due to GI losses.  Monitor replace as needed.  Hypomagnesemia -giving 2 g IV mag sulfate for Mg 1.6 today (10/7).  Sinus tachycardia -most likely due to dehydration, colitis and just acute illness.   Has been on IV fluids -stop now that oral intake improved.   Metoprolol as needed if HR sustaining over 110.  Dehydration -present on admission, due to GI losses and poor p.o. intake.  Improved with IV hydration and supportive care. --Stop IV fluids and monitor  Thrombocytopenia -felt due to consumption of platelets given large aortic mural thrombus.   Platelet trend: 82>> 43>> 63>> 51.  Metastatic stage IV colon cancer -oncology is following, chemotherapy on hold.  Supportive care monitor.    Patient BMI: Body mass index is 18.85 kg/m.   DVT  prophylaxis:  apixaban (ELIQUIS) tablet 10 mg  apixaban (ELIQUIS) tablet 5 mg    Diet:  Diet Orders (From admission, onward)     Start     Ordered   05/21/21 0756  DIET SOFT Room service appropriate? Yes; Fluid consistency: Thin  Diet effective now       Question Answer Comment  Room service appropriate? Yes   Fluid consistency: Thin      05/21/21 0755              Code Status: Full Code   Subjective 05/24/21    Patient up in recliner when seen on rounds today.  She reports recurrence of diarrhea yesterday and again early this morning.  Imodium not given.  She does not feel as well as she did yesterday in general.  Reporting some right lower quadrant plain pain only with voiding but no hematuria or dysuria.   Disposition Plan & Communication   Status is: Inpatient  Remains inpatient appropriate because:IV treatments appropriate due to intensity of illness or inability to take PO  Dispo:  Patient From: Home  Planned Disposition: Home with Health Care Svc  Medically stable for discharge: No     Consults, Procedures, Significant Events   Consultants:  Hematology-oncology  Procedures:  None  Antimicrobials:  Anti-infectives (From admission, onward)    Start     Dose/Rate Route Frequency Ordered Stop   05/20/21 1000  ceFEPIme (MAXIPIME) 2 g in sodium chloride 0.9 % 100 mL IVPB  Status:  Discontinued        2 g 200 mL/hr over 30 Minutes Intravenous Every 8 hours 05/20/21 0919 05/22/21 1428   05/19/21 1000  metroNIDAZOLE (FLAGYL) IVPB 500 mg  Status:  Discontinued        500 mg 100 mL/hr over 60 Minutes Intravenous Every 12 hours 05/18/21 2327 05/22/21 1428   05/18/21 2359  ceFEPIme (MAXIPIME) 2 g in sodium chloride 0.9 % 100 mL IVPB  Status:  Discontinued        2 g 200 mL/hr over 30 Minutes Intravenous Every 12 hours 05/18/21 2332 05/20/21 0919   05/18/21 2015  metroNIDAZOLE (FLAGYL) IVPB 500 mg        500 mg 100 mL/hr over 60 Minutes Intravenous  Once 05/18/21 2013 05/18/21 2234   05/18/21 1815  cefTRIAXone (ROCEPHIN) 1 g in sodium  chloride 0.9 % 100 mL IVPB        1 g 200 mL/hr over 30 Minutes Intravenous  Once 05/18/21 1805 05/18/21 2033         Micro    Objective   Vitals:   05/23/21 1716 05/23/21 2059 05/24/21 0400 05/24/21 1230  BP:  109/72 (!) 115/92 105/79  Pulse: (!) 126 (!) 101 98 96  Resp:  16 19 16   Temp:  98.1 F (36.7 C) 98.6 F (37 C) 98.4 F (36.9 C)  TempSrc:  Oral Oral Oral  SpO2:  95% 97% 99%  Weight:      Height:        Intake/Output Summary (Last 24 hours) at 05/24/2021 1556 Last data filed at 05/23/2021 1710 Gross per 24 hour  Intake 120 ml  Output --  Net 120 ml   Filed Weights   05/18/21 2044 05/19/21 1450  Weight: 44.5 kg 49.8 kg    Physical Exam:  General exam: awake, alert, no acute distress, frail, chronically ill-appearing Respiratory system: normal respiratory effort, on room air. Cardiovascular system: normal S1/S2, RRR, no pedal edema.  Gastrointestinal system: soft, nontender, nondistended. Central nervous system: A&O x3. no gross focal neurologic deficits, normal speech Psychiatry: normal mood, congruent affect, judgement and insight appear normal  Labs   Data Reviewed: I have personally reviewed following labs and imaging studies  CBC: Recent Labs  Lab 05/18/21 1750 05/19/21 0618 05/20/21 0400 05/21/21 0358 05/22/21 0354 05/22/21 1451 05/24/21 0406  WBC 22.7* 20.9* 26.9* 21.6* 24.4*  --  23.0*  NEUTROABS 14.1*  --   --   --  20.5*  --   --   HGB 17.3* 14.2 15.6* 13.4 11.7* 12.0 10.3*  HCT 46.9* 39.7 43.5 38.0 32.9* 33.7* 28.3*  MCV 91.6 94.5 94.6 95.7 96.2  --  93.7  PLT 92* 82* 43* 63* 51*  --  81*   Basic Metabolic Panel: Recent Labs  Lab 05/19/21 0921 05/20/21 0400 05/21/21 0358 05/22/21 0354 05/24/21 0406 05/24/21 1400  NA  --  130* 132* 130* 124* 126*  K  --  3.5 3.0* 3.6 3.8 3.8  CL  --  102 104 105 101 103  CO2  --  22 22 18* 15* 17*  GLUCOSE  --  141* 168* 146* 117* 112*  BUN  --  34* 27* 24* 27* 29*  CREATININE  --   0.61 0.63 0.64 0.59 0.72  CALCIUM  --  8.0* 8.0* 7.9* 7.6* 7.6*  MG 1.9 1.7 2.3 2.0  --  1.6*   GFR: Estimated Creatinine Clearance: 59.5 mL/min (by C-G formula based on SCr of 0.72 mg/dL). Liver Function Tests: Recent Labs  Lab 05/18/21 1750 05/19/21 0618  AST 35 27  ALT 31 26  ALKPHOS 102 73  BILITOT 1.4* 0.8  PROT 6.0* 4.5*  ALBUMIN 3.0* 2.2*   No results for input(s): LIPASE, AMYLASE in the last 168 hours. Recent Labs  Lab 05/18/21 1751  AMMONIA 38*   Coagulation Profile: Recent Labs  Lab 05/18/21 2044 05/19/21 0618  INR 1.3* 1.5*   Cardiac Enzymes: No results for input(s): CKTOTAL, CKMB, CKMBINDEX, TROPONINI in the last 168 hours. BNP (last 3 results) No results for input(s): PROBNP in the last 8760 hours. HbA1C: No results for input(s): HGBA1C in the last 72 hours. CBG: Recent Labs  Lab 05/21/21 2108  GLUCAP 135*   Lipid Profile: No results for input(s): CHOL, HDL, LDLCALC, TRIG, CHOLHDL, LDLDIRECT in the last 72 hours. Thyroid Function Tests: No results for input(s): TSH, T4TOTAL, FREET4, T3FREE, THYROIDAB in the last 72 hours. Anemia Panel: No results for input(s): VITAMINB12, FOLATE, FERRITIN, TIBC, IRON, RETICCTPCT in the last 72 hours. Sepsis Labs: Recent Labs  Lab 05/18/21 1903 05/18/21 2039 05/18/21 2044 05/19/21 0007 05/19/21 0251  PROCALCITON  --   --  0.77  --   --   LATICACIDVEN 2.1* 2.8*  --  3.2* 1.9    Recent Results (from the past 240 hour(s))  Resp Panel by RT-PCR (Flu A&B, Covid) Nasopharyngeal Swab     Status: None   Collection Time: 05/18/21  5:50 PM   Specimen: Nasopharyngeal Swab; Nasopharyngeal(NP) swabs in vial transport medium  Result Value Ref Range Status   SARS Coronavirus 2 by RT PCR NEGATIVE NEGATIVE Final    Comment: (NOTE) SARS-CoV-2 target nucleic acids are NOT DETECTED.  The SARS-CoV-2 RNA is generally detectable in upper respiratory specimens during the acute phase of infection. The lowest concentration of  SARS-CoV-2 viral copies this assay can detect is 138 copies/mL. A negative result does not preclude SARS-Cov-2 infection and should not be used as the  sole basis for treatment or other patient management decisions. A negative result may occur with  improper specimen collection/handling, submission of specimen other than nasopharyngeal swab, presence of viral mutation(s) within the areas targeted by this assay, and inadequate number of viral copies(<138 copies/mL). A negative result must be combined with clinical observations, patient history, and epidemiological information. The expected result is Negative.  Fact Sheet for Patients:  EntrepreneurPulse.com.au  Fact Sheet for Healthcare Providers:  IncredibleEmployment.be  This test is no t yet approved or cleared by the [place] FDA and  has been authorized for detection and/or diagnosis of SARS-CoV-2 by FDA under an Emergency Use Authorization (EUA). This EUA will remain  in effect (meaning this test can be used) for the duration of the COVID-19 declaration under Section 564(b)(1) of the Act, 21 U.S.C.section 360bbb-3(b)(1), unless the authorization is terminated  or revoked sooner.       Influenza A by PCR NEGATIVE NEGATIVE Final   Influenza B by PCR NEGATIVE NEGATIVE Final    Comment: (NOTE) The Xpert Xpress SARS-CoV-2/FLU/RSV plus assay is intended as an aid in the diagnosis of influenza from Nasopharyngeal swab specimens and should not be used as a sole basis for treatment. Nasal washings and aspirates are unacceptable for Xpert Xpress SARS-CoV-2/FLU/RSV testing.  Fact Sheet for Patients: EntrepreneurPulse.com.au  Fact Sheet for Healthcare Providers: IncredibleEmployment.be  This test is not yet approved or cleared by the [place] FDA and has been authorized for detection and/or diagnosis of SARS-CoV-2 by FDA under an Emergency Use  Authorization (EUA). This EUA will remain in effect (meaning this test can be used) for the duration of the COVID-19 declaration under Section 564(b)(1) of the Act, 21 U.S.C. section 360bbb-3(b)(1), unless the authorization is terminated or revoked.  Performed at [facility], [place] [address] [place], [place] [address]   Blood culture (routine x 2)     Status: None   Collection Time: 05/18/21  7:02 PM   Specimen: BLOOD  Result Value Ref Range Status   Specimen Description   Final    BLOOD LEFT ANTECUBITAL Performed at [place] [address] [place], [place] [address]    Special Requests   Final    BOTTLES DRAWN AEROBIC AND ANAEROBIC Blood Culture adequate volume Performed at [place] [address], [place], [place] [address]    Culture   Final    NO GROWTH 5 DAYS Performed at [facility], [place] [address] [place], [place] [address]    Report Status 05/24/2021 FINAL  Final  Blood culture (routine x 2)     Status: None   Collection Time: 05/18/21  7:08 PM   Specimen: BLOOD  Result Value Ref Range Status   Specimen Description   Final    BLOOD RIGHT ANTECUBITAL Performed at [place] [address] [place], [place] [address]    Special Requests   Final    BOTTLES DRAWN AEROBIC AND ANAEROBIC Blood Culture adequate volume Performed at [place] [address], [place], [place] [address]    Culture   Final    NO GROWTH 5 DAYS Performed at [facility], [place] [address] [place],  [address]    Report Status 05/24/2021 FINAL  Final  Urine Culture     Status: None   Collection Time: 05/18/21  9:01 PM   Specimen: Urine, Catheterized  Result Value Ref Range Status   Specimen Description  Final    URINE, CATHETERIZED Performed at [facility], [place] [address], [place], [place] [address]    Special Requests   Final     NONE Performed at [facility], [place] [address] [place], [place] [address]    Culture   Final    NO GROWTH Performed at [facility], [place] [address], [place], [place] [address]    Report Status 05/20/2021 FINAL  Final  Gastrointestinal Panel by PCR , Stool     Status: Abnormal   Collection Time: 05/19/21 10:54 PM   Specimen: Stool  Result Value Ref Range Status   Campylobacter species NOT DETECTED NOT DETECTED Final   Plesimonas shigelloides NOT DETECTED NOT DETECTED Final   Salmonella species NOT DETECTED NOT DETECTED Final   Yersinia enterocolitica NOT DETECTED NOT DETECTED Final   Vibrio species NOT DETECTED NOT DETECTED Final   Vibrio cholerae NOT DETECTED NOT DETECTED Final   Enteroaggregative E coli (EAEC) NOT DETECTED NOT DETECTED Final   Enteropathogenic E coli (EPEC) NOT DETECTED NOT DETECTED Final   Enterotoxigenic E coli (ETEC) NOT DETECTED NOT DETECTED Final   Shiga like toxin producing E coli (STEC) NOT DETECTED NOT DETECTED Final   Shigella/Enteroinvasive E coli (EIEC) NOT DETECTED NOT DETECTED Final   Cryptosporidium NOT DETECTED NOT DETECTED Final   Cyclospora cayetanensis NOT DETECTED NOT DETECTED Final   Entamoeba histolytica NOT DETECTED NOT DETECTED Final   Giardia lamblia NOT DETECTED NOT DETECTED Final   Adenovirus F40/41 NOT DETECTED NOT DETECTED Final   Astrovirus NOT DETECTED NOT DETECTED Final   Norovirus GI/GII DETECTED (A) NOT DETECTED Final    Comment: RESULT CALLED TO, READ BACK BY AND VERIFIED WITH: C/REBECCA PULEO 05/20/21 1052 AMK    Rotavirus A NOT DETECTED NOT DETECTED Final   Sapovirus (I, II, IV, and V) NOT DETECTED NOT DETECTED Final    Comment: Performed at [facility], [place] [place], [place] [address]  C Difficile Quick Screen w PCR reflex     Status: Abnormal   Collection Time: 05/19/21 10:54 PM   Specimen: Stool  Result Value Ref Range Status   C Diff antigen NON REACTIVE (A) NEGATIVE Final    C Diff toxin NON REACTIVE (A) NEGATIVE Final   C Diff interpretation VALID  Final    Comment: Performed at [facility], [place] [address], [place], [place] [address]      Imaging Studies   No results found.   Medications   Scheduled Meds:  apixaban  10 mg Oral BID   Followed by   Derrill Memo ON 05/28/2021] apixaban  5 mg Oral BID   feeding supplement  1 Container Oral QID   multivitamin with minerals  1 tablet Oral Daily   nicotine  21 mg Transdermal Daily   pantoprazole  40 mg Oral Q0600   Continuous Infusions:  sodium chloride 100 mL/hr at 05/24/21 1118        LOS: 5 days    Time spent: 30 minutes with > 50% spent at bedside and in coordination of care     Ezekiel Slocumb, DO [facility]  05/24/2021, 3:56 PM      If 7PM-7AM, please contact night-coverage. How to contact the [facility] Attending or Consulting provider [place] or covering provider during after hours [place], for this patient?    Check the care team in [facility] and look for a) attending/consulting TR[facility] provider listed and b) the [place]  team listed Log into [email or web address] and use [place]'s universal password to access. If you do not have the password, please contact the hospital operator. Locate the [facility] provider you are looking for under [facility] and page to a number that you can be directly reached. If you still have difficulty reaching the provider, please page the [facility] (Director on Call) for the Hospitalists listed on amion for assistance.

## 2021-05-24 NOTE — Progress Notes (Signed)
Occupational Therapy Treatment Patient Details Name: Angela Mcpherson MRN: [id number] DOB: [birth year] Today's Date: 05/24/2021   History of present illness 60 yo female admitted with AMS, acute encephalopathy, weakness, colitis, acute thrombus abd aorta. Hx of stage IV rectal ca-chemo   OT comments  Pt presents with progress towards goals this session. Pt performed functional mobility with RW to complete grooming at sink with min guard. Pt with SOB after toileting and standing at sink for 10 minutes, expressed concerns about respiratory rate during activity, VSS throughout. Continuing to recommend [facility] post-discharge, will continue to follow in the acute setting.    Recommendations for follow up therapy are one component of a multi-disciplinary discharge planning process, led by the attending physician.  Recommendations may be updated based on patient status, additional functional criteria and insurance authorization.    Follow Up Recommendations  Home health OT    Equipment Recommendations  Tub/shower seat    Recommendations for Other Services      Precautions / Restrictions Precautions Precautions: Fall Precaution Comments: with bowel incontinence Restrictions Weight Bearing Restrictions: No       Mobility Bed Mobility               General bed mobility comments: pt in recliner    Transfers Overall transfer level: Needs assistance Equipment used: Rolling walker (2 wheeled) Transfers: Sit to/from Stand Sit to Stand: Min guard         General transfer comment: for safety, needing verbal cues for hand placement during sit to stand    Balance Overall balance assessment: Needs assistance         Standing balance support: During functional activity Standing balance-Leahy Scale: Poor Standing balance comment: maintaining atleast one UE support throughout grooming                           ADL either performed or assessed with clinical judgement    ADL Overall ADL's : Needs assistance/impaired     Grooming: Standing;Wash/dry hands;Oral care;Min guard Grooming Details (indicate cue type and reason): for safety                 Toilet Transfer: Min guard;Ambulation;RW Toilet Transfer Details (indicate cue type and reason): for safety, needing min cues to push up from surface rather than using RW to support weight while standing Toileting- Clothing Manipulation and Hygiene: Sit to/from stand;Minimal assistance;Min guard Toileting - Clothing Manipulation Details (indicate cue type and reason): for safety, washed perianal and perineal areas in sitting with min guard, needed assistance to apply desitin on bottom.     Functional mobility during ADLs: Passenger transport manager     Praxis      Cognition Arousal/Alertness: Awake/alert Behavior During Therapy: WFL for tasks assessed/performed Overall Cognitive Status: Within Functional Limits for tasks assessed Area of Impairment: Problem solving                             Problem Solving: Requires verbal cues General Comments: Needing increased time, reported she needed basin to spit during oral care but was over the sink, needing min verbal cues to problem solve.        Exercises     Shoulder Instructions       General Comments slight active bleeding continued from skin tear on bottom and loose bowel incontinence during  session, SN aware    Pertinent Vitals/ Pain       Pain Assessment: 0-10 Pain Score: 7  Faces Pain Scale: Hurts little more Pain Location: groin area; buttocks when wiping Pain Descriptors / Indicators: Discomfort;Sore Pain Intervention(s): Limited activity within patient's tolerance;Monitored during session  Home Living                                          Prior Functioning/Environment              Frequency  Min 2X/week        Progress Toward Goals  OT  Goals(current goals can now be found in the care plan section)  Progress towards OT goals: Progressing toward goals  Acute Rehab OT Goals Patient Stated Goal: to get better/regain PLOF OT Goal Formulation: With patient Time For Goal Achievement: 06/05/21 Potential to Achieve Goals: Good ADL Goals Pt Will Perform Lower Body Dressing: with modified independence;sit to/from stand Pt Will Transfer to Toilet: with modified independence;ambulating;regular height toilet;grab bars Pt Will Perform Toileting - Clothing Manipulation and hygiene: with modified independence;sit to/from stand Additional ADL Goal #1: Patient will stand at sink to perform grooming task as evidence of improving activity tolerance  Plan Discharge plan remains appropriate    Co-evaluation                 AM-PAC OT "6 Clicks" Daily Activity     Outcome Measure   Help from another person eating meals?: None Help from another person taking care of personal grooming?: A Little Help from another person toileting, which includes using toliet, bedpan, or urinal?: A Little Help from another person bathing (including washing, rinsing, drying)?: A Little Help from another person to put on and taking off regular upper body clothing?: A Little Help from another person to put on and taking off regular lower body clothing?: A Little 6 Click Score: 19    End of Session Equipment Utilized During Treatment: Rolling walker  OT Visit Diagnosis: Unsteadiness on feet (R26.81);Muscle weakness (generalized) (M62.81)   Activity Tolerance Patient tolerated treatment well   Patient Left in chair;with call bell/phone within reach;with chair alarm set   Nurse Communication Mobility status        Time: 6384-5364 OT Time Calculation (min): 42 min  Charges: OT General Charges $OT Visit: 1 Visit OT Treatments $Self Care/Home Management : 38-52 mins  Jackquline Denmark, OTS Acute Rehab Office: [phone]   Angela Mcpherson 05/24/2021, 12:32 PM

## 2021-05-25 DIAGNOSIS — G9340 Encephalopathy, unspecified: Secondary | ICD-10-CM | POA: Diagnosis not present

## 2021-05-25 LAB — CBC
HCT: 31.7 % — ABNORMAL LOW (ref 36.0–46.0)
Hemoglobin: 11.1 g/dL — ABNORMAL LOW (ref 12.0–15.0)
MCH: 33.8 pg (ref 26.0–34.0)
MCHC: 35 g/dL (ref 30.0–36.0)
MCV: 96.6 fL (ref 80.0–100.0)
Platelets: 88 10*3/uL — ABNORMAL LOW (ref 150–400)
RBC: 3.28 MIL/uL — ABNORMAL LOW (ref 3.87–5.11)
RDW: 20.7 % — ABNORMAL HIGH (ref 11.5–15.5)
WBC: 20.8 10*3/uL — ABNORMAL HIGH (ref 4.0–10.5)
nRBC: 0 % (ref 0.0–0.2)

## 2021-05-25 LAB — BASIC METABOLIC PANEL
Anion gap: 8 (ref 5–15)
Anion gap: 5 (ref 5–15)
BUN: 23 mg/dL — ABNORMAL HIGH (ref 6–20)
BUN: 22 mg/dL — ABNORMAL HIGH (ref 6–20)
CO2: 15 mmol/L — ABNORMAL LOW (ref 22–32)
CO2: 17 mmol/L — ABNORMAL LOW (ref 22–32)
Calcium: 7.9 mg/dL — ABNORMAL LOW (ref 8.9–10.3)
Calcium: 7.9 mg/dL — ABNORMAL LOW (ref 8.9–10.3)
Chloride: 107 mmol/L (ref 98–111)
Chloride: 104 mmol/L (ref 98–111)
Creatinine, Ser: 0.66 mg/dL (ref 0.44–1.00)
Creatinine, Ser: 0.69 mg/dL (ref 0.44–1.00)
GFR, Estimated: >60 mL/min (ref >60)
GFR, Estimated: >60 mL/min (ref >60)
Glucose, Bld: 152 mg/dL — ABNORMAL HIGH (ref 70–99)
Glucose, Bld: 117 mg/dL — ABNORMAL HIGH (ref 70–99)
Potassium: 3.5 mmol/L (ref 3.5–5.1)
Potassium: 4.2 mmol/L (ref 3.5–5.1)
Sodium: 130 mmol/L — ABNORMAL LOW (ref 135–145)
Sodium: 126 mmol/L — ABNORMAL LOW (ref 135–145)

## 2021-05-25 LAB — MAGNESIUM: Magnesium: 2 mg/dL (ref 1.7–2.4)

## 2021-05-25 LAB — OSMOLALITY, URINE: Osmolality, Ur: 519 mOsm/kg (ref 300–900)

## 2021-05-25 LAB — PHOSPHORUS: Phosphorus: 2.9 mg/dL (ref 2.5–4.6)

## 2021-05-25 LAB — GLUCOSE, CAPILLARY: Glucose-Capillary: 120 mg/dL — ABNORMAL HIGH (ref 70–99)

## 2021-05-25 MED ORDER — POTASSIUM CHLORIDE CRYS ER 20 MEQ PO TBCR
40.0000 meq | EXTENDED_RELEASE_TABLET | Freq: Once | ORAL | Status: AC
  Administered 2021-05-25: 40 meq via ORAL
  Filled 2021-05-25: qty 2

## 2021-05-25 MED ORDER — SODIUM BICARBONATE 650 MG PO TABS
650.0000 mg | ORAL_TABLET | Freq: Two times a day (BID) | ORAL | Status: DC
  Administered 2021-05-25 – 2021-05-27 (×5): 650 mg via ORAL
  Filled 2021-05-25 (×5): qty 1

## 2021-05-25 NOTE — Progress Notes (Signed)
PROGRESS NOTE    Angela Mcpherson   ZHG:[id number]  DOB: [birth year]  PCP: Truitt Merle, MD    DOA: 05/18/2021 LOS: 7    Brief Narrative / Hospital Course to Date:   60 year old female with past medical history of stage IV rectal cancer presented to the ED with altered mental status, generalized weakness and watery diarrhea for about a week.  Husband reported with progressively worsening confusion, poor p.o. intake secondary to nausea vomiting at home.  Evaluation in the ED showed leukocytosis and labs consistent with dehydration.  CT abdomen pelvis with contrast showed a noncalcified thrombus in the infrarenal abdominal aorta causing severe stenosis signs of infectious versus inflammatory enterocolitis (possibly ischemic due to thrombus), small volume ascites, decreased size of rectosigmoid mass and previously identified liver lesions.  Stool studies subsequently positive for norovirus.  Assessment & Plan   Principal Problem:   Acute encephalopathy Active Problems:   Malignant neoplasm of rectosigmoid junction (HCC)   Colitis   Dehydration   Aortic mural thrombus (HCC)   Thrombocytopenia (HCC)   Protein-calorie malnutrition, severe   Gastroenteritis due to norovirus   Acute metabolic encephalopathy -due to acute infectious colitis and dehydration.  Ammonia only slightly elevated at 38.  CT head was negative. Clinically improving with supportive care and empiric antibiotics (cefepime and Flagyl). --Blood cultures negative -- Clinically stable off antibiotics -- Continue to monitor --Supportive care: Antiemetics, IV fluids, pain control  Hypotonic hyponatremia -10/7: Sodium dropped from 1 30-1 24 today 10/7.  Likely in the setting of GI losses with recurrent diarrhea.  Also has non-anion gap metabolic acidosis which would also be consistent with this.   --500 cc bolus NS followed by 100 cc/h --Recheck BMP this afternoon --Give Imodium for diarrhea  Normal anion gap metabolic  acidosis -most likely secondary to diarrhea.  Sodium bicarb tablet twice daily and recheck BMP tomorrow.  Continue IV fluids as well.  Infrarenal abdominal aortic mural thrombus -patient has been transitioned off heparin infusion to Eliquis. "Scans reviewed by vascular surgeon, Dr.Brabham who noted that patient does have palpable pedal pulses, no vascular intervention recommended at this time and her current condition with preservation of palpable pedal pulses and no evidence of embolic disease is felt patient does not require open surgery or percutaneous stenting." --Hematology/oncology following, see their recommendations. --Continue Eliquis  Acute colitis due to norovirus gastroenteritis -CT of abdomen and pelvis showed wall thickening of the ascending colon, cecum, ileum.  Loose watery stools reported at home prior to admission.  C. difficile negative.  Treated with empiric cefepime and Flagyl. --Off IV fluids, p.o. intake improved --Off antibiotics and clinically improved  Melena/lower GI bleeding -reported by bedside RN this morning 10/5.  Most likely due to either the rectosigmoid mass more likely than enterocolitis. 10/6: Bedside RN reports brown stool, bleeding appears from ulcerations. --Transfuse if hemoglobin drops less than 7  Hypokalemia -due to GI losses.  Monitor replace as needed.  Hypomagnesemia -giving 2 g IV mag sulfate for Mg 1.6 (10/7).  Sinus tachycardia -most likely due to dehydration, colitis and just acute illness.   Has been on IV fluids -stop now that oral intake improved.   Metoprolol as needed if HR sustaining over 110.  Dehydration -present on admission, due to GI losses and poor p.o. intake.  Improved with IV hydration and supportive care. --Stop IV fluids and monitor  Thrombocytopenia -felt due to consumption of platelets given large aortic mural thrombus.   Platelet trend: 82>> 43>> 63>> 51.  Metastatic stage IV colon cancer -oncology is following,  chemotherapy on hold.  Supportive care monitor.    Patient BMI: Body mass index is 18.85 kg/m.   DVT prophylaxis:  apixaban (ELIQUIS) tablet 10 mg  apixaban (ELIQUIS) tablet 5 mg   Diet:  Diet Orders (From admission, onward)     Start     Ordered   05/21/21 0756  DIET SOFT Room service appropriate? Yes; Fluid consistency: Thin  Diet effective now       Question Answer Comment  Room service appropriate? Yes   Fluid consistency: Thin      05/21/21 0755              Code Status: Full Code   Subjective 05/26/21    Patient reports improvement in diarrhea.  Was given Imodium twice yesterday.  Reports appetite is okay but food here not appealing so has not been eating very much.  Looks hard to get home and cooking her food.  Denies abdominal pain, nausea vomiting.  Denies bleeding.  Hopes to go home tomorrow.   Disposition Plan & Communication   Status is: Inpatient  Remains inpatient appropriate because:IV treatments appropriate due to intensity of illness or inability to take PO  Dispo:  Patient From: Home  Planned Disposition: Home with Health Care Svc  Medically stable for discharge: No     Consults, Procedures, Significant Events   Consultants:  Hematology-oncology  Procedures:  None  Antimicrobials:  Anti-infectives (From admission, onward)    Start     Dose/Rate Route Frequency Ordered Stop   05/20/21 1000  ceFEPIme (MAXIPIME) 2 g in sodium chloride 0.9 % 100 mL IVPB  Status:  Discontinued        2 g 200 mL/hr over 30 Minutes Intravenous Every 8 hours 05/20/21 0919 05/22/21 1428   05/19/21 1000  metroNIDAZOLE (FLAGYL) IVPB 500 mg  Status:  Discontinued        500 mg 100 mL/hr over 60 Minutes Intravenous Every 12 hours 05/18/21 2327 05/22/21 1428   05/18/21 2359  ceFEPIme (MAXIPIME) 2 g in sodium chloride 0.9 % 100 mL IVPB  Status:  Discontinued        2 g 200 mL/hr over 30 Minutes Intravenous Every 12 hours 05/18/21 2332 05/20/21 0919   05/18/21 2015   metroNIDAZOLE (FLAGYL) IVPB 500 mg        500 mg 100 mL/hr over 60 Minutes Intravenous  Once 05/18/21 2013 05/18/21 2234   05/18/21 1815  cefTRIAXone (ROCEPHIN) 1 g in sodium chloride 0.9 % 100 mL IVPB        1 g 200 mL/hr over 30 Minutes Intravenous  Once 05/18/21 1805 05/18/21 2033         Micro    Objective   Vitals:   05/25/21 0521 05/25/21 1204 05/25/21 2116 05/26/21 0546  BP: 113/77 110/80 114/87 112/81  Pulse: 96 99 (!) 105 (!) 102  Resp: 16 16 16 14   Temp: 97.7 F (36.5 C) 97.9 F (36.6 C) 97.8 F (36.6 C) 97.9 F (36.6 C)  TempSrc: Oral Oral Oral Oral  SpO2: 95% 97% 96% 95%  Weight:      Height:        Intake/Output Summary (Last 24 hours) at 05/26/2021 0725 Last data filed at 05/26/2021 0500 Gross per 24 hour  Intake 2403.26 ml  Output --  Net 2403.26 ml   Filed Weights   05/18/21 2044 05/19/21 1450  Weight: 44.5 kg 49.8 kg    Physical  Exam:  General exam: awake, alert, no acute distress, frail, chronically ill-appearing Respiratory system: normal respiratory effort, on room air. Cardiovascular system: normal S1/S2, RRR, no pedal edema.   Gastrointestinal system: soft, nontender, nondistended. Central nervous system: A&O x3. no gross focal neurologic deficits, normal speech Psychiatry: normal mood, congruent affect, judgement and insight appear normal  Labs   Data Reviewed: I have personally reviewed following labs and imaging studies  CBC: Recent Labs  Lab 05/20/21 0400 05/21/21 0358 05/22/21 0354 05/22/21 1451 05/24/21 0406 05/25/21 0405  WBC 26.9* 21.6* 24.4*  --  23.0* 20.8*  NEUTROABS  --   --  20.5*  --   --   --   HGB 15.6* 13.4 11.7* 12.0 10.3* 11.1*  HCT 43.5 38.0 32.9* 33.7* 28.3* 31.7*  MCV 94.6 95.7 96.2  --  93.7 96.6  PLT 43* 63* 51*  --  81* 88*   Basic Metabolic Panel: Recent Labs  Lab 05/20/21 0400 05/21/21 0358 05/22/21 0354 05/24/21 0406 05/24/21 1400 05/25/21 0405 05/25/21 1646  NA 130* 132* 130* 124* 126*  130* 126*  K 3.5 3.0* 3.6 3.8 3.8 3.5 4.2  CL 102 104 105 101 103 107 104  CO2 22 22 18* 15* 17* 15* 17*  GLUCOSE 141* 168* 146* 117* 112* 152* 117*  BUN 34* 27* 24* 27* 29* 23* 22*  CREATININE 0.61 0.63 0.64 0.59 0.72 0.66 0.69  CALCIUM 8.0* 8.0* 7.9* 7.6* 7.6* 7.9* 7.9*  MG 1.7 2.3 2.0  --  1.6* 2.0  --   PHOS  --   --   --   --   --   --  2.9   GFR: Estimated Creatinine Clearance: 59.5 mL/min (by C-G formula based on SCr of 0.69 mg/dL). Liver Function Tests: No results for input(s): AST, ALT, ALKPHOS, BILITOT, PROT, ALBUMIN in the last 168 hours.  No results for input(s): LIPASE, AMYLASE in the last 168 hours. No results for input(s): AMMONIA in the last 168 hours.  Coagulation Profile: No results for input(s): INR, PROTIME in the last 168 hours.  Cardiac Enzymes: No results for input(s): CKTOTAL, CKMB, CKMBINDEX, TROPONINI in the last 168 hours. BNP (last 3 results) No results for input(s): PROBNP in the last 8760 hours. HbA1C: No results for input(s): HGBA1C in the last 72 hours. CBG: Recent Labs  Lab 05/21/21 2108 05/25/21 0838  GLUCAP 135* 120*   Lipid Profile: No results for input(s): CHOL, HDL, LDLCALC, TRIG, CHOLHDL, LDLDIRECT in the last 72 hours. Thyroid Function Tests: No results for input(s): TSH, T4TOTAL, FREET4, T3FREE, THYROIDAB in the last 72 hours. Anemia Panel: No results for input(s): VITAMINB12, FOLATE, FERRITIN, TIBC, IRON, RETICCTPCT in the last 72 hours. Sepsis Labs: No results for input(s): PROCALCITON, LATICACIDVEN in the last 168 hours.   Recent Results (from the past 240 hour(s))  Resp Panel by RT-PCR (Flu A&B, Covid) Nasopharyngeal Swab     Status: None   Collection Time: 05/18/21  5:50 PM   Specimen: Nasopharyngeal Swab; Nasopharyngeal(NP) swabs in vial transport medium  Result Value Ref Range Status   SARS Coronavirus 2 by RT PCR NEGATIVE NEGATIVE Final    Comment: (NOTE) SARS-CoV-2 target nucleic acids are NOT DETECTED.  The  SARS-CoV-2 RNA is generally detectable in upper respiratory specimens during the acute phase of infection. The lowest concentration of SARS-CoV-2 viral copies this assay can detect is 138 copies/mL. A negative result does not preclude SARS-Cov-2 infection and should not be used as the sole basis for treatment or  other patient management decisions. A negative result may occur with  improper specimen collection/handling, submission of specimen other than nasopharyngeal swab, presence of viral mutation(s) within the areas targeted by this assay, and inadequate number of viral copies(<138 copies/mL). A negative result must be combined with clinical observations, patient history, and epidemiological information. The expected result is Negative.  Fact Sheet for Patients:  EntrepreneurPulse.com.au  Fact Sheet for Healthcare Providers:  IncredibleEmployment.be  This test is no t yet approved or cleared by the [place] FDA and  has been authorized for detection and/or diagnosis of SARS-CoV-2 by FDA under an Emergency Use Authorization (EUA). This EUA will remain  in effect (meaning this test can be used) for the duration of the COVID-19 declaration under Section 564(b)(1) of the Act, 21 U.S.C.section 360bbb-3(b)(1), unless the authorization is terminated  or revoked sooner.       Influenza A by PCR NEGATIVE NEGATIVE Final   Influenza B by PCR NEGATIVE NEGATIVE Final    Comment: (NOTE) The Xpert Xpress SARS-CoV-2/FLU/RSV plus assay is intended as an aid in the diagnosis of influenza from Nasopharyngeal swab specimens and should not be used as a sole basis for treatment. Nasal washings and aspirates are unacceptable for Xpert Xpress SARS-CoV-2/FLU/RSV testing.  Fact Sheet for Patients: EntrepreneurPulse.com.au  Fact Sheet for Healthcare Providers: IncredibleEmployment.be  This test is not yet approved or  cleared by the [place] FDA and has been authorized for detection and/or diagnosis of SARS-CoV-2 by FDA under an Emergency Use Authorization (EUA). This EUA will remain in effect (meaning this test can be used) for the duration of the COVID-19 declaration under Section 564(b)(1) of the Act, 21 U.S.C. section 360bbb-3(b)(1), unless the authorization is terminated or revoked.  Performed at [facility], [place] [address] [place], [place] [address]   Blood culture (routine x 2)     Status: None   Collection Time: 05/18/21  7:02 PM   Specimen: BLOOD  Result Value Ref Range Status   Specimen Description   Final    BLOOD LEFT ANTECUBITAL Performed at [facility] [address], [place], [place] [address]    Special Requests   Final    BOTTLES DRAWN AEROBIC AND ANAEROBIC Blood Culture adequate volume Performed at [place] [address] [place], [place] [address]    Culture   Final    NO GROWTH 5 DAYS Performed at [facility], [place] [address] [place], [place] [address]    Report Status 05/24/2021 FINAL  Final  Blood culture (routine x 2)     Status: None   Collection Time: 05/18/21  7:08 PM   Specimen: BLOOD  Result Value Ref Range Status   Specimen Description   Final    BLOOD RIGHT ANTECUBITAL Performed at [facility] [address] [place], [place] [address]    Special Requests   Final    BOTTLES DRAWN AEROBIC AND ANAEROBIC Blood Culture adequate volume Performed at [place] [address], [place], [place] [address]    Culture   Final    NO GROWTH 5 DAYS Performed at [facility], [place] [address] [place], [place] [address]    Report Status 05/24/2021 FINAL  Final  Urine Culture     Status: None   Collection Time: 05/18/21  9:01 PM   Specimen: Urine, Catheterized  Result Value Ref Range Status   Specimen Description   Final    URINE,  CATHETERIZED Performed at [facility] [address] [place], [place] [address]    Special Requests   Final    NONE Performed at [facility], [place] [address], [place], [place] [address]    Culture   Final    NO GROWTH Performed at [facility], [place] [address] [place], [place] [address]    Report Status 05/20/2021 FINAL  Final  Gastrointestinal Panel by PCR , Stool     Status: Abnormal   Collection Time: 05/19/21 10:54 PM   Specimen: Stool  Result Value Ref Range Status   Campylobacter species NOT DETECTED NOT DETECTED Final   Plesimonas shigelloides NOT DETECTED NOT DETECTED Final   Salmonella species NOT DETECTED NOT DETECTED Final   Yersinia enterocolitica NOT DETECTED NOT DETECTED Final   Vibrio species NOT DETECTED NOT DETECTED Final   Vibrio cholerae NOT DETECTED NOT DETECTED Final   Enteroaggregative E coli (EAEC) NOT DETECTED NOT DETECTED Final   Enteropathogenic E coli (EPEC) NOT DETECTED NOT DETECTED Final   Enterotoxigenic E coli (ETEC) NOT DETECTED NOT DETECTED Final   Shiga like toxin producing E coli (STEC) NOT DETECTED NOT DETECTED Final   Shigella/Enteroinvasive E coli (EIEC) NOT DETECTED NOT DETECTED Final   Cryptosporidium NOT DETECTED NOT DETECTED Final   Cyclospora cayetanensis NOT DETECTED NOT DETECTED Final   Entamoeba histolytica NOT DETECTED NOT DETECTED Final   Giardia lamblia NOT DETECTED NOT DETECTED Final   Adenovirus F40/41 NOT DETECTED NOT DETECTED Final   Astrovirus NOT DETECTED NOT DETECTED Final   Norovirus GI/GII DETECTED (A) NOT DETECTED Final    Comment: RESULT CALLED TO, READ BACK BY AND VERIFIED WITH: C/REBECCA PULEO 05/20/21 1052 AMK    Rotavirus A NOT DETECTED NOT DETECTED Final   Sapovirus (I, II, IV, and V) NOT DETECTED NOT DETECTED Final    Comment: Performed at [facility], [place] [place], [place] [address]  C Difficile Quick Screen w PCR reflex     Status: Abnormal    Collection Time: 05/19/21 10:54 PM   Specimen: Stool  Result Value Ref Range Status   C Diff antigen NON REACTIVE (A) NEGATIVE Final   C Diff toxin NON REACTIVE (A) NEGATIVE Final   C Diff interpretation VALID  Final    Comment: Performed at [facility], [place] [address] [place], [place] [address]      Imaging Studies   No results found.   Medications   Scheduled Meds:  apixaban  10 mg Oral BID   Followed by   Derrill Memo ON 05/28/2021] apixaban  5 mg Oral BID   feeding supplement  1 Container Oral QID   multivitamin with minerals  1 tablet Oral Daily   nicotine  21 mg Transdermal Daily   pantoprazole  40 mg Oral Q0600   sodium bicarbonate  650 mg Oral BID   Continuous Infusions:  sodium chloride 100 mL/hr at 05/26/21 0500        LOS: 7 days    Time spent: 30 minutes with > 50% spent at bedside and in coordination of care     Ezekiel Slocumb, DO [facility]  05/26/2021, 7:25 AM      If 7PM-7AM, please contact night-coverage. How to contact the [facility] Attending or Consulting provider [place] or covering provider during after hours [place], for this patient?    Check the care team in [facility] and look for a) attending/consulting [place] provider listed and  b) the [facility] team listed Log into [email or web address] and use [place]'s universal password to access. If you do not have the password, please contact the hospital operator. Locate the [facility] provider you are looking for under [facility] and page to a number that you can be directly reached. If you still have difficulty reaching the provider, please page the [facility] (Director on Call) for the Hospitalists listed on amion for assistance.

## 2021-05-26 ENCOUNTER — Inpatient Hospital Stay (HOSPITAL_COMMUNITY): Payer: 59

## 2021-05-26 DIAGNOSIS — G9340 Encephalopathy, unspecified: Secondary | ICD-10-CM | POA: Diagnosis not present

## 2021-05-26 LAB — BASIC METABOLIC PANEL
Anion gap: 6 (ref 5–15)
BUN: 24 mg/dL — ABNORMAL HIGH (ref 6–20)
CO2: 16 mmol/L — ABNORMAL LOW (ref 22–32)
Calcium: 7.9 mg/dL — ABNORMAL LOW (ref 8.9–10.3)
Chloride: 105 mmol/L (ref 98–111)
Creatinine, Ser: 0.59 mg/dL (ref 0.44–1.00)
GFR, Estimated: >60 mL/min (ref >60)
Glucose, Bld: 103 mg/dL — ABNORMAL HIGH (ref 70–99)
Potassium: 4.6 mmol/L (ref 3.5–5.1)
Sodium: 127 mmol/L — ABNORMAL LOW (ref 135–145)

## 2021-05-26 LAB — COMPREHENSIVE METABOLIC PANEL
ALT: 28 U/L (ref 0–44)
ALT: 30 U/L (ref 0–44)
AST: 32 U/L (ref 15–41)
AST: 34 U/L (ref 15–41)
Albumin: 1.9 g/dL — ABNORMAL LOW (ref 3.5–5.0)
Albumin: 2.3 g/dL — ABNORMAL LOW (ref 3.5–5.0)
Alkaline Phosphatase: 102 U/L (ref 38–126)
Alkaline Phosphatase: 100 U/L (ref 38–126)
Anion gap: 6 (ref 5–15)
Anion gap: 7 (ref 5–15)
BUN: 23 mg/dL — ABNORMAL HIGH (ref 6–20)
BUN: 22 mg/dL — ABNORMAL HIGH (ref 6–20)
CO2: 16 mmol/L — ABNORMAL LOW (ref 22–32)
CO2: 17 mmol/L — ABNORMAL LOW (ref 22–32)
Calcium: 7.8 mg/dL — ABNORMAL LOW (ref 8.9–10.3)
Calcium: 8.4 mg/dL — ABNORMAL LOW (ref 8.9–10.3)
Chloride: 105 mmol/L (ref 98–111)
Chloride: 110 mmol/L (ref 98–111)
Creatinine, Ser: 0.63 mg/dL (ref 0.44–1.00)
Creatinine, Ser: 0.58 mg/dL (ref 0.44–1.00)
GFR, Estimated: >60 mL/min (ref >60)
GFR, Estimated: >60 mL/min (ref >60)
Glucose, Bld: 98 mg/dL (ref 70–99)
Glucose, Bld: 106 mg/dL — ABNORMAL HIGH (ref 70–99)
Potassium: 4.6 mmol/L (ref 3.5–5.1)
Potassium: 5 mmol/L (ref 3.5–5.1)
Sodium: 127 mmol/L — ABNORMAL LOW (ref 135–145)
Sodium: 134 mmol/L — ABNORMAL LOW (ref 135–145)
Total Bilirubin: 0.9 mg/dL (ref 0.3–1.2)
Total Bilirubin: 0.8 mg/dL (ref 0.3–1.2)
Total Protein: 4.8 g/dL — ABNORMAL LOW (ref 6.5–8.1)
Total Protein: 5.2 g/dL — ABNORMAL LOW (ref 6.5–8.1)

## 2021-05-26 LAB — OSMOLALITY: Osmolality: 268 mOsm/kg — ABNORMAL LOW (ref 275–295)

## 2021-05-26 LAB — CBC
HCT: 31.6 % — ABNORMAL LOW (ref 36.0–46.0)
Hemoglobin: 11.3 g/dL — ABNORMAL LOW (ref 12.0–15.0)
MCH: 34.5 pg — ABNORMAL HIGH (ref 26.0–34.0)
MCHC: 35.8 g/dL (ref 30.0–36.0)
MCV: 96.3 fL (ref 80.0–100.0)
Platelets: 116 10*3/uL — ABNORMAL LOW (ref 150–400)
RBC: 3.28 MIL/uL — ABNORMAL LOW (ref 3.87–5.11)
RDW: 22 % — ABNORMAL HIGH (ref 11.5–15.5)
WBC: 16.7 10*3/uL — ABNORMAL HIGH (ref 4.0–10.5)
nRBC: 0 % (ref 0.0–0.2)

## 2021-05-26 LAB — BRAIN NATRIURETIC PEPTIDE: B Natriuretic Peptide: 38.8 pg/mL (ref 0.0–100.0)

## 2021-05-26 LAB — MAGNESIUM: Magnesium: 2 mg/dL (ref 1.7–2.4)

## 2021-05-26 MED ORDER — ALBUMIN HUMAN 25 % IV SOLN
12.5000 g | Freq: Once | INTRAVENOUS | Status: AC
  Administered 2021-05-26: 12.5 g via INTRAVENOUS
  Filled 2021-05-26: qty 50

## 2021-05-26 MED ORDER — SODIUM CHLORIDE 0.9 % IV BOLUS
250.0000 mL | Freq: Once | INTRAVENOUS | Status: AC
  Administered 2021-05-26: 250 mL via INTRAVENOUS

## 2021-05-26 MED ORDER — ALBUMIN HUMAN 5 % IV SOLN: 12.5000 g | Freq: Once | INTRAVENOUS | Status: DC

## 2021-05-26 MED ORDER — SODIUM CHLORIDE 0.9 % IV SOLN: INTRAVENOUS | Status: DC

## 2021-05-26 MED ORDER — SODIUM CHLORIDE 0.9 % IV BOLUS
500.0000 mL | Freq: Once | INTRAVENOUS | Status: AC
  Administered 2021-05-26: 500 mL via INTRAVENOUS

## 2021-05-26 MED ORDER — FUROSEMIDE 10 MG/ML IJ SOLN: 40.0000 mg | Freq: Once | INTRAMUSCULAR | Status: DC

## 2021-05-26 MED ORDER — FUROSEMIDE 10 MG/ML IJ SOLN
40.0000 mg | Freq: Two times a day (BID) | INTRAMUSCULAR | Status: DC
  Administered 2021-05-26 – 2021-05-27 (×2): 40 mg via INTRAVENOUS
  Filled 2021-05-26 (×2): qty 4

## 2021-05-26 NOTE — Progress Notes (Deleted)
PROGRESS NOTE    Angela Mcpherson   XLK:[id number]  DOB: [birth year]  PCP: Truitt Merle, MD    DOA: 05/18/2021 LOS: 7    Brief Narrative / Hospital Course to Date:   60 year old female with past medical history of stage IV rectal cancer presented to the ED with altered mental status, generalized weakness and watery diarrhea for about a week.  Husband reported with progressively worsening confusion, poor p.o. intake secondary to nausea vomiting at home.  Evaluation in the ED showed leukocytosis and labs consistent with dehydration.  CT abdomen pelvis with contrast showed a noncalcified thrombus in the infrarenal abdominal aorta causing severe stenosis signs of infectious versus inflammatory enterocolitis (possibly ischemic due to thrombus), small volume ascites, decreased size of rectosigmoid mass and previously identified liver lesions.  Stool studies subsequently positive for norovirus.  Assessment & Plan   Principal Problem:   Acute encephalopathy Active Problems:   Malignant neoplasm of rectosigmoid junction (HCC)   Colitis   Dehydration   Aortic mural thrombus (HCC)   Thrombocytopenia (HCC)   Protein-calorie malnutrition, severe   Gastroenteritis due to norovirus   Acute metabolic encephalopathy -due to acute infectious colitis and dehydration.  Ammonia only slightly elevated at 38.  CT head was negative. Clinically improving with supportive care and empiric antibiotics (cefepime and Flagyl). --Blood cultures negative -- Clinically stable off antibiotics -- Continue to monitor --Supportive care: Antiemetics, IV fluids, pain control  Hypotonic hyponatremia - Hypervolemic -10/9 pt now with resolved diarrhea but progressive swelling and dyspnea on exertion.  Today did fluid challenge and sodium unchanged at 127.  Pt with worsening dyspnea, now hypervolemic.  --Stop IV fluids --Start diuresis with IV Lasix --I/O's and daily weights --Serial BMP's to monitor --Give Imodium for  diarrhea  Normal anion gap metabolic acidosis -most likely secondary to diarrhea.  Sodium bicarb tablet twice daily and follow BMP.   Infrarenal abdominal aortic mural thrombus -patient has been transitioned off heparin infusion to Eliquis. "Scans reviewed by vascular surgeon, Dr.Brabham who noted that patient does have palpable pedal pulses, no vascular intervention recommended at this time and her current condition with preservation of palpable pedal pulses and no evidence of embolic disease is felt patient does not require open surgery or percutaneous stenting." --Hematology/oncology following, see their recommendations. --Continue Eliquis  Acute colitis due to norovirus gastroenteritis -CT of abdomen and pelvis showed wall thickening of the ascending colon, cecum, ileum.  Loose watery stools reported at home prior to admission.  C. difficile negative.  Treated with empiric cefepime and Flagyl. --Off IV fluids, p.o. intake improved --Off antibiotics and clinically improved  Melena/lower GI bleeding -resolved.   Reported by bedside RN this morning 10/5.  Most likely due to either the rectosigmoid mass more likely than enterocolitis. 10/6: Bedside RN reports brown stool, bleeding appears from ulcerations. --Transfuse if hemoglobin drops less than 7  Hypokalemia -due to GI losses.  Monitor replace as needed.  Hypomagnesemia - replaced.  Monitor replace as needed.  Sinus tachycardia -most likely due to dehydration, colitis and just acute illness.   Has been on IV fluids -stop now that oral intake improved.   Metoprolol as needed if HR sustaining over 110.  Dehydration -present on admission, due to GI losses and poor p.o. intake.  Improved with IV hydration and supportive care. --Stop IV fluids and monitor  Thrombocytopenia -felt due to consumption of platelets given large aortic mural thrombus.   Platelet trend: 82>> 43>> 63>> 51.  Metastatic stage IV colon cancer -  oncology is  following, chemotherapy on hold.  Supportive care monitor.    Patient BMI: Body mass index is 18.85 kg/m.   DVT prophylaxis:  apixaban (ELIQUIS) tablet 10 mg  apixaban (ELIQUIS) tablet 5 mg   Diet:  Diet Orders (From admission, onward)     Start     Ordered   05/21/21 0756  DIET SOFT Room service appropriate? Yes; Fluid consistency: Thin  Diet effective now       Question Answer Comment  Room service appropriate? Yes   Fluid consistency: Thin      05/21/21 0755              Code Status: Full Code   Subjective 05/26/21    Patient sitting up in bed, feeling well overall.  Reports progressive leg swelling up to the knees.  No abdominal pain but abdomen feels more distended.  Having some shortness of breath with exertion.  No fever/chills, N/V.  Had a BM that was 'mushy' no longer watery.  Diarrhea has improved.   Disposition Plan & Communication   Status is: Inpatient  Remains inpatient appropriate because:IV treatments appropriate due to intensity of illness or inability to take PO  Dispo:  Patient From: Home  Planned Disposition: Home with Health Care Svc  Medically stable for discharge: No     Consults, Procedures, Significant Events   Consultants:  Hematology-oncology  Procedures:  None  Antimicrobials:  Anti-infectives (From admission, onward)    Start     Dose/Rate Route Frequency Ordered Stop   05/20/21 1000  ceFEPIme (MAXIPIME) 2 g in sodium chloride 0.9 % 100 mL IVPB  Status:  Discontinued        2 g 200 mL/hr over 30 Minutes Intravenous Every 8 hours 05/20/21 0919 05/22/21 1428   05/19/21 1000  metroNIDAZOLE (FLAGYL) IVPB 500 mg  Status:  Discontinued        500 mg 100 mL/hr over 60 Minutes Intravenous Every 12 hours 05/18/21 2327 05/22/21 1428   05/18/21 2359  ceFEPIme (MAXIPIME) 2 g in sodium chloride 0.9 % 100 mL IVPB  Status:  Discontinued        2 g 200 mL/hr over 30 Minutes Intravenous Every 12 hours 05/18/21 2332 05/20/21 0919    05/18/21 2015  metroNIDAZOLE (FLAGYL) IVPB 500 mg        500 mg 100 mL/hr over 60 Minutes Intravenous  Once 05/18/21 2013 05/18/21 2234   05/18/21 1815  cefTRIAXone (ROCEPHIN) 1 g in sodium chloride 0.9 % 100 mL IVPB        1 g 200 mL/hr over 30 Minutes Intravenous  Once 05/18/21 1805 05/18/21 2033         Micro    Objective   Vitals:   05/25/21 1204 05/25/21 2116 05/26/21 0546 05/26/21 1112  BP: 110/80 114/87 112/81 105/80  Pulse: 99 (!) 105 (!) 102 99  Resp: 16 16 14 16   Temp: 97.9 F (36.6 C) 97.8 F (36.6 C) 97.9 F (36.6 C) 97.7 F (36.5 C)  TempSrc: Oral Oral Oral Oral  SpO2: 97% 96% 95% 99%  Weight:      Height:        Intake/Output Summary (Last 24 hours) at 05/26/2021 1440 Last data filed at 05/26/2021 0900 Gross per 24 hour  Intake 2523.26 ml  Output --  Net 2523.26 ml   Filed Weights   05/18/21 2044 05/19/21 1450  Weight: 44.5 kg 49.8 kg    Physical Exam:  General exam: awake, alert,  no acute distress, frail, chronically ill-appearing Respiratory system: normal respiratory effort, on room air. Cardiovascular system: normal S1/S2, RRR, no pedal edema.   Gastrointestinal system: mildly distended, nontender Central nervous system: A&O x3. no gross focal neurologic deficits, normal speech Psychiatry: normal mood, congruent affect, judgement and insight appear normal  Labs   Data Reviewed: I have personally reviewed following labs and imaging studies  CBC: Recent Labs  Lab 05/21/21 0358 05/22/21 0354 05/22/21 1451 05/24/21 0406 05/25/21 0405 05/26/21 0759  WBC 21.6* 24.4*  --  23.0* 20.8* 16.7*  NEUTROABS  --  20.5*  --   --   --   --   HGB 13.4 11.7* 12.0 10.3* 11.1* 11.3*  HCT 38.0 32.9* 33.7* 28.3* 31.7* 31.6*  MCV 95.7 96.2  --  93.7 96.6 96.3  PLT 63* 51*  --  81* 88* 831*   Basic Metabolic Panel: Recent Labs  Lab 05/21/21 0358 05/22/21 0354 05/24/21 0406 05/24/21 1400 05/25/21 0405 05/25/21 1646 05/26/21 0759 05/26/21 1206   NA 132* 130*   < > 126* 130* 126* 127* 127*  K 3.0* 3.6   < > 3.8 3.5 4.2 4.6 4.6  CL 104 105   < > 103 107 104 105 105  CO2 22 18*   < > 17* 15* 17* 16* 16*  GLUCOSE 168* 146*   < > 112* 152* 117* 103* 98  BUN 27* 24*   < > 29* 23* 22* 24* 23*  CREATININE 0.63 0.64   < > 0.72 0.66 0.69 0.59 0.63  CALCIUM 8.0* 7.9*   < > 7.6* 7.9* 7.9* 7.9* 7.8*  MG 2.3 2.0  --  1.6* 2.0  --  2.0  --   PHOS  --   --   --   --   --  2.9  --   --    < > = values in this interval not displayed.   GFR: Estimated Creatinine Clearance: 59.5 mL/min (by C-G formula based on SCr of 0.63 mg/dL). Liver Function Tests: Recent Labs  Lab 05/26/21 1206  AST 32  ALT 28  ALKPHOS 102  BILITOT 0.9  PROT 4.8*  ALBUMIN 1.9*    No results for input(s): LIPASE, AMYLASE in the last 168 hours. No results for input(s): AMMONIA in the last 168 hours.  Coagulation Profile: No results for input(s): INR, PROTIME in the last 168 hours.  Cardiac Enzymes: No results for input(s): CKTOTAL, CKMB, CKMBINDEX, TROPONINI in the last 168 hours. BNP (last 3 results) No results for input(s): PROBNP in the last 8760 hours. HbA1C: No results for input(s): HGBA1C in the last 72 hours. CBG: Recent Labs  Lab 05/21/21 2108 05/25/21 0838  GLUCAP 135* 120*   Lipid Profile: No results for input(s): CHOL, HDL, LDLCALC, TRIG, CHOLHDL, LDLDIRECT in the last 72 hours. Thyroid Function Tests: No results for input(s): TSH, T4TOTAL, FREET4, T3FREE, THYROIDAB in the last 72 hours. Anemia Panel: No results for input(s): VITAMINB12, FOLATE, FERRITIN, TIBC, IRON, RETICCTPCT in the last 72 hours. Sepsis Labs: No results for input(s): PROCALCITON, LATICACIDVEN in the last 168 hours.   Recent Results (from the past 240 hour(s))  Resp Panel by RT-PCR (Flu A&B, Covid) Nasopharyngeal Swab     Status: None   Collection Time: 05/18/21  5:50 PM   Specimen: Nasopharyngeal Swab; Nasopharyngeal(NP) swabs in vial transport medium  Result Value  Ref Range Status   SARS Coronavirus 2 by RT PCR NEGATIVE NEGATIVE Final    Comment: (NOTE) SARS-CoV-2 target  nucleic acids are NOT DETECTED.  The SARS-CoV-2 RNA is generally detectable in upper respiratory specimens during the acute phase of infection. The lowest concentration of SARS-CoV-2 viral copies this assay can detect is 138 copies/mL. A negative result does not preclude SARS-Cov-2 infection and should not be used as the sole basis for treatment or other patient management decisions. A negative result may occur with  improper specimen collection/handling, submission of specimen other than nasopharyngeal swab, presence of viral mutation(s) within the areas targeted by this assay, and inadequate number of viral copies(<138 copies/mL). A negative result must be combined with clinical observations, patient history, and epidemiological information. The expected result is Negative.  Fact Sheet for Patients:  EntrepreneurPulse.com.au  Fact Sheet for Healthcare Providers:  IncredibleEmployment.be  This test is no t yet approved or cleared by the [place] FDA and  has been authorized for detection and/or diagnosis of SARS-CoV-2 by FDA under an Emergency Use Authorization (EUA). This EUA will remain  in effect (meaning this test can be used) for the duration of the COVID-19 declaration under Section 564(b)(1) of the Act, 21 U.S.C.section 360bbb-3(b)(1), unless the authorization is terminated  or revoked sooner.       Influenza A by PCR NEGATIVE NEGATIVE Final   Influenza B by PCR NEGATIVE NEGATIVE Final    Comment: (NOTE) The Xpert Xpress SARS-CoV-2/FLU/RSV plus assay is intended as an aid in the diagnosis of influenza from Nasopharyngeal swab specimens and should not be used as a sole basis for treatment. Nasal washings and aspirates are unacceptable for Xpert Xpress SARS-CoV-2/FLU/RSV testing.  Fact Sheet for  Patients: EntrepreneurPulse.com.au  Fact Sheet for Healthcare Providers: IncredibleEmployment.be  This test is not yet approved or cleared by the [place] FDA and has been authorized for detection and/or diagnosis of SARS-CoV-2 by FDA under an Emergency Use Authorization (EUA). This EUA will remain in effect (meaning this test can be used) for the duration of the COVID-19 declaration under Section 564(b)(1) of the Act, 21 U.S.C. section 360bbb-3(b)(1), unless the authorization is terminated or revoked.  Performed at [facility], [place] [address] [place], [place] [address]   Blood culture (routine x 2)     Status: None   Collection Time: 05/18/21  7:02 PM   Specimen: BLOOD  Result Value Ref Range Status   Specimen Description   Final    BLOOD LEFT ANTECUBITAL Performed at [facility] [address], [place], [place] [address]    Special Requests   Final    BOTTLES DRAWN AEROBIC AND ANAEROBIC Blood Culture adequate volume Performed at [facility] [address], [place], [place] [address]    Culture   Final    NO GROWTH 5 DAYS Performed at [facility], [place] [address] [place], [place] [address]    Report Status 05/24/2021 FINAL  Final  Blood culture (routine x 2)     Status: None   Collection Time: 05/18/21  7:08 PM   Specimen: BLOOD  Result Value Ref Range Status   Specimen Description   Final    BLOOD RIGHT ANTECUBITAL Performed at [facility] [address], [place], [place] [address]    Special Requests   Final    BOTTLES DRAWN AEROBIC AND ANAEROBIC Blood Culture adequate volume Performed at [facility] [address] [place], [place] [address]    Culture   Final    NO GROWTH 5 DAYS Performed at [facility]  Lab, [address] [place], [place] [address]    Report Status 05/24/2021 FINAL  Final  Urine Culture      Status: None   Collection Time: 05/18/21  9:01 PM   Specimen: Urine, Catheterized  Result Value Ref Range Status   Specimen Description   Final    URINE, CATHETERIZED Performed at [facility] [address] [place], [place] [address]    Special Requests   Final    NONE Performed at [facility], [place] [address] [place] [address], [place], [place] [address]    Culture   Final    NO GROWTH Performed at [facility], [place] [address] [place], [place] [address]    Report Status 05/20/2021 FINAL  Final  Gastrointestinal Panel by PCR , Stool     Status: Abnormal   Collection Time: 05/19/21 10:54 PM   Specimen: Stool  Result Value Ref Range Status   Campylobacter species NOT DETECTED NOT DETECTED Final   Plesimonas shigelloides NOT DETECTED NOT DETECTED Final   Salmonella species NOT DETECTED NOT DETECTED Final   Yersinia enterocolitica NOT DETECTED NOT DETECTED Final   Vibrio species NOT DETECTED NOT DETECTED Final   Vibrio cholerae NOT DETECTED NOT DETECTED Final   Enteroaggregative E coli (EAEC) NOT DETECTED NOT DETECTED Final   Enteropathogenic E coli (EPEC) NOT DETECTED NOT DETECTED Final   Enterotoxigenic E coli (ETEC) NOT DETECTED NOT DETECTED Final   Shiga like toxin producing E coli (STEC) NOT DETECTED NOT DETECTED Final   Shigella/Enteroinvasive E coli (EIEC) NOT DETECTED NOT DETECTED Final   Cryptosporidium NOT DETECTED NOT DETECTED Final   Cyclospora cayetanensis NOT DETECTED NOT DETECTED Final   Entamoeba histolytica NOT DETECTED NOT DETECTED Final   Giardia lamblia NOT DETECTED NOT DETECTED Final   Adenovirus F40/41 NOT DETECTED NOT DETECTED Final   Astrovirus NOT DETECTED NOT DETECTED Final   Norovirus GI/GII DETECTED (A) NOT DETECTED Final    Comment: RESULT CALLED TO, READ BACK BY AND VERIFIED WITH: C/REBECCA PULEO 05/20/21 1052 AMK    Rotavirus A NOT DETECTED NOT DETECTED Final   Sapovirus (I, II, IV, and V) NOT DETECTED NOT DETECTED  Final    Comment: Performed at [facility], [place] [place], [place] [address]  C Difficile Quick Screen w PCR reflex     Status: Abnormal   Collection Time: 05/19/21 10:54 PM   Specimen: Stool  Result Value Ref Range Status   C Diff antigen NON REACTIVE (A) NEGATIVE Final   C Diff toxin NON REACTIVE (A) NEGATIVE Final   C Diff interpretation VALID  Final    Comment: Performed at [facility], [place] [address] [place], [place] [address]      Imaging Studies   No results found.   Medications   Scheduled Meds:  apixaban  10 mg Oral BID   Followed by   Derrill Memo ON 05/28/2021] apixaban  5 mg Oral BID   feeding supplement  1 Container Oral QID   furosemide  40 mg Intravenous BID   multivitamin with minerals  1 tablet Oral Daily   nicotine  21 mg Transdermal Daily   pantoprazole  40 mg Oral Q0600   sodium bicarbonate  650 mg Oral BID   Continuous Infusions:        LOS: 7 days    Time spent: 30 minutes with > 50% spent at bedside and in coordination of care     Ezekiel Slocumb, DO  [facility]  05/26/2021, 2:40 PM      If 7PM-7AM, please contact night-coverage. How to contact the [facility] Attending or Consulting provider Kath[place] or covering provider during after hours [place], for this patient?    Check the care team in [facility] and look for a) attending/consulting [facility] provider listed and b) the [facility] team listed Log into [email or web address] and use [facility]'s universal password to access. If you do not have the password, please contact the hospital operator. Locate the [facility] provider you are looking for under [facility] and page to a number that you can be directly reached. If you still have difficulty reaching the provider, please page the [facility] (Director on Call) for the Hospitalists listed on amion for assistance.

## 2021-05-26 NOTE — Progress Notes (Signed)
Pt SOB after albumin and bolus completed. VSs Decreased lung bases, SOB noted when talking.  Decreased IVF and elevated HOB, updated MD. Will cont to monitor. SRP, RN

## 2021-05-26 NOTE — Progress Notes (Signed)
PROGRESS NOTE    Angela Mcpherson   JSH:[id number]  DOB: 05/30/1961  PCP: Truitt Merle, MD    DOA: 05/18/2021 LOS: 7    Brief Narrative / Hospital Course to Date:   60 year old female with past medical history of stage IV rectal cancer presented to the ED with altered mental status, generalized weakness and watery diarrhea for about a week.  Husband reported with progressively worsening confusion, poor p.o. intake secondary to nausea vomiting at home.  Evaluation in the ED showed leukocytosis and labs consistent with dehydration.  CT abdomen pelvis with contrast showed a noncalcified thrombus in the infrarenal abdominal aorta causing severe stenosis signs of infectious versus inflammatory enterocolitis (possibly ischemic due to thrombus), small volume ascites, decreased size of rectosigmoid mass and previously identified liver lesions.  Stool studies subsequently positive for norovirus.  Assessment & Plan   Principal Problem:   Acute encephalopathy Active Problems:   Malignant neoplasm of rectosigmoid junction (HCC)   Colitis   Dehydration   Aortic mural thrombus (HCC)   Thrombocytopenia (HCC)   Protein-calorie malnutrition, severe   Gastroenteritis due to norovirus   Acute metabolic encephalopathy -due to acute infectious colitis and dehydration.  Ammonia only slightly elevated at 38.  CT head was negative. Clinically improving with supportive care and empiric antibiotics (cefepime and Flagyl). --Blood cultures negative -- Clinically stable off antibiotics -- Continue to monitor --Supportive care: Antiemetics, IV fluids, pain control  Hypotonic hyponatremia - Hypervolemic -10/9 pt now with resolved diarrhea but progressive swelling and dyspnea on exertion.  Status post fluid challenge and sodium unchanged at 127.   --Stop IV fluids --Start diuresis with IV Lasix --I/O's and daily weights --Serial BMP's to monitor --Give Imodium for diarrhea  Normal anion gap metabolic  acidosis -most likely secondary to diarrhea.  Sodium bicarb tablet twice daily and follow BMP.   Infrarenal abdominal aortic mural thrombus -patient has been transitioned off heparin infusion to Eliquis. "Scans reviewed by vascular surgeon, Dr.Brabham who noted that patient does have palpable pedal pulses, no vascular intervention recommended at this time and her current condition with preservation of palpable pedal pulses and no evidence of embolic disease is felt patient does not require open surgery or percutaneous stenting." --Hematology/oncology following, see their recommendations. --Continue Eliquis  Acute colitis due to norovirus gastroenteritis -CT of abdomen and pelvis showed wall thickening of the ascending colon, cecum, ileum.  Loose watery stools reported at home prior to admission.  C. difficile negative.  Treated with empiric cefepime and Flagyl. --Off IV fluids, p.o. intake improved --Off antibiotics and clinically improved  Melena/lower GI bleeding -resolved.   Reported by bedside RN this morning 10/5.  Most likely due to either the rectosigmoid mass more likely than enterocolitis. 10/6: Bedside RN reports brown stool, bleeding appears from ulcerations. --Transfuse if hemoglobin drops less than 7  Hypokalemia -due to GI losses.  Monitor replace as needed.  Hypomagnesemia - replaced.  Monitor replace as needed.  Sinus tachycardia -most likely due to dehydration, colitis and just acute illness.   Has been on IV fluids -stop now that oral intake improved.   Metoprolol as needed if HR sustaining over 110.  Dehydration -present on admission, due to GI losses and poor p.o. intake.  Improved with IV hydration and supportive care. --Stop IV fluids and monitor  Thrombocytopenia -felt due to consumption of platelets given large aortic mural thrombus.   Platelet trend: 82>> 43>> 63>> 51.  Metastatic stage IV colon cancer -oncology is following, chemotherapy on hold.  Supportive  care monitor.    Patient BMI: Body mass index is 18.85 kg/m.   DVT prophylaxis:  apixaban (ELIQUIS) tablet 10 mg  apixaban (ELIQUIS) tablet 5 mg   Diet:  Diet Orders (From admission, onward)     Start     Ordered   05/21/21 0756  DIET SOFT Room service appropriate? Yes; Fluid consistency: Thin  Diet effective now       Question Answer Comment  Room service appropriate? Yes   Fluid consistency: Thin      05/21/21 0755              Code Status: Full Code   Subjective 05/26/21    Patient sitting up in bed, feeling well overall.  Reports progressive leg swelling up to the knees.  No abdominal pain but abdomen feels more distended.  Having some shortness of breath with exertion.  No fever/chills, N/V.  Had a BM that was 'mushy' no longer watery.  Diarrhea has improved.   Disposition Plan & Communication   Status is: Inpatient  Remains inpatient appropriate because:IV treatments appropriate due to intensity of illness or inability to take PO  Dispo:  Patient From: Home  Planned Disposition: Home with Health Care Svc  Medically stable for discharge: No     Consults, Procedures, Significant Events   Consultants:  Hematology-oncology  Procedures:  None  Antimicrobials:  Anti-infectives (From admission, onward)    Start     Dose/Rate Route Frequency Ordered Stop   05/20/21 1000  ceFEPIme (MAXIPIME) 2 g in sodium chloride 0.9 % 100 mL IVPB  Status:  Discontinued        2 g 200 mL/hr over 30 Minutes Intravenous Every 8 hours 05/20/21 0919 05/22/21 1428   05/19/21 1000  metroNIDAZOLE (FLAGYL) IVPB 500 mg  Status:  Discontinued        500 mg 100 mL/hr over 60 Minutes Intravenous Every 12 hours 05/18/21 2327 05/22/21 1428   05/18/21 2359  ceFEPIme (MAXIPIME) 2 g in sodium chloride 0.9 % 100 mL IVPB  Status:  Discontinued        2 g 200 mL/hr over 30 Minutes Intravenous Every 12 hours 05/18/21 2332 05/20/21 0919   05/18/21 2015  metroNIDAZOLE (FLAGYL) IVPB 500 mg         500 mg 100 mL/hr over 60 Minutes Intravenous  Once 05/18/21 2013 05/18/21 2234   05/18/21 1815  cefTRIAXone (ROCEPHIN) 1 g in sodium chloride 0.9 % 100 mL IVPB        1 g 200 mL/hr over 30 Minutes Intravenous  Once 05/18/21 1805 05/18/21 2033         Micro    Objective   Vitals:   05/25/21 1204 05/25/21 2116 05/26/21 0546 05/26/21 1112  BP: 110/80 114/87 112/81 105/80  Pulse: 99 (!) 105 (!) 102 99  Resp: 16 16 14 16   Temp: 97.9 F (36.6 C) 97.8 F (36.6 C) 97.9 F (36.6 C) 97.7 F (36.5 C)  TempSrc: Oral Oral Oral Oral  SpO2: 97% 96% 95% 99%  Weight:      Height:        Intake/Output Summary (Last 24 hours) at 05/26/2021 1450 Last data filed at 05/26/2021 0900 Gross per 24 hour  Intake 2523.26 ml  Output --  Net 2523.26 ml    Filed Weights   05/18/21 2044 05/19/21 1450  Weight: 44.5 kg 49.8 kg    Physical Exam:  General exam: awake, alert, no acute distress, frail, chronically ill-appearing  Respiratory system: normal respiratory effort, on room air. Cardiovascular system: normal S1/S2, RRR, no pedal edema.   Gastrointestinal system: mildly distended, nontender Central nervous system: A&O x3. no gross focal neurologic deficits, normal speech Psychiatry: normal mood, congruent affect, judgement and insight appear normal  Labs   Data Reviewed: I have personally reviewed following labs and imaging studies  CBC: Recent Labs  Lab 05/21/21 0358 05/22/21 0354 05/22/21 1451 05/24/21 0406 05/25/21 0405 05/26/21 0759  WBC 21.6* 24.4*  --  23.0* 20.8* 16.7*  NEUTROABS  --  20.5*  --   --   --   --   HGB 13.4 11.7* 12.0 10.3* 11.1* 11.3*  HCT 38.0 32.9* 33.7* 28.3* 31.7* 31.6*  MCV 95.7 96.2  --  93.7 96.6 96.3  PLT 63* 51*  --  81* 88* 116*    Basic Metabolic Panel: Recent Labs  Lab 05/21/21 0358 05/22/21 0354 05/24/21 0406 05/24/21 1400 05/25/21 0405 05/25/21 1646 05/26/21 0759 05/26/21 1206  NA 132* 130*   < > 126* 130* 126* 127* 127*  K  3.0* 3.6   < > 3.8 3.5 4.2 4.6 4.6  CL 104 105   < > 103 107 104 105 105  CO2 22 18*   < > 17* 15* 17* 16* 16*  GLUCOSE 168* 146*   < > 112* 152* 117* 103* 98  BUN 27* 24*   < > 29* 23* 22* 24* 23*  CREATININE 0.63 0.64   < > 0.72 0.66 0.69 0.59 0.63  CALCIUM 8.0* 7.9*   < > 7.6* 7.9* 7.9* 7.9* 7.8*  MG 2.3 2.0  --  1.6* 2.0  --  2.0  --   PHOS  --   --   --   --   --  2.9  --   --    < > = values in this interval not displayed.    GFR: Estimated Creatinine Clearance: 59.5 mL/min (by C-G formula based on SCr of 0.63 mg/dL). Liver Function Tests: Recent Labs  Lab 05/26/21 1206  AST 32  ALT 28  ALKPHOS 102  BILITOT 0.9  PROT 4.8*  ALBUMIN 1.9*     No results for input(s): LIPASE, AMYLASE in the last 168 hours. No results for input(s): AMMONIA in the last 168 hours.  Coagulation Profile: No results for input(s): INR, PROTIME in the last 168 hours.  Cardiac Enzymes: No results for input(s): CKTOTAL, CKMB, CKMBINDEX, TROPONINI in the last 168 hours. BNP (last 3 results) No results for input(s): PROBNP in the last 8760 hours. HbA1C: No results for input(s): HGBA1C in the last 72 hours. CBG: Recent Labs  Lab 05/21/21 2108 05/25/21 0838  GLUCAP 135* 120*    Lipid Profile: No results for input(s): CHOL, HDL, LDLCALC, TRIG, CHOLHDL, LDLDIRECT in the last 72 hours. Thyroid Function Tests: No results for input(s): TSH, T4TOTAL, FREET4, T3FREE, THYROIDAB in the last 72 hours. Anemia Panel: No results for input(s): VITAMINB12, FOLATE, FERRITIN, TIBC, IRON, RETICCTPCT in the last 72 hours. Sepsis Labs: No results for input(s): PROCALCITON, LATICACIDVEN in the last 168 hours.   Recent Results (from the past 240 hour(s))  Resp Panel by RT-PCR (Flu A&B, Covid) Nasopharyngeal Swab     Status: None   Collection Time: 05/18/21  5:50 PM   Specimen: Nasopharyngeal Swab; Nasopharyngeal(NP) swabs in vial transport medium  Result Value Ref Range Status   SARS Coronavirus 2 by RT  PCR NEGATIVE NEGATIVE Final    Comment: (NOTE) SARS-CoV-2 target nucleic acids  are NOT DETECTED.  The SARS-CoV-2 RNA is generally detectable in upper respiratory specimens during the acute phase of infection. The lowest concentration of SARS-CoV-2 viral copies this assay can detect is 138 copies/mL. A negative result does not preclude SARS-Cov-2 infection and should not be used as the sole basis for treatment or other patient management decisions. A negative result may occur with  improper specimen collection/handling, submission of specimen other than nasopharyngeal swab, presence of viral mutation(s) within the areas targeted by this assay, and inadequate number of viral copies(<138 copies/mL). A negative result must be combined with clinical observations, patient history, and epidemiological information. The expected result is Negative.  Fact Sheet for Patients:  EntrepreneurPulse.com.au  Fact Sheet for Healthcare Providers:  IncredibleEmployment.be  This test is no t yet approved or cleared by the [place] FDA and  has been authorized for detection and/or diagnosis of SARS-CoV-2 by FDA under an Emergency Use Authorization (EUA). This EUA will remain  in effect (meaning this test can be used) for the duration of the COVID-19 declaration under Section 564(b)(1) of the Act, 21 U.S.C.section 360bbb-3(b)(1), unless the authorization is terminated  or revoked sooner.       Influenza A by PCR NEGATIVE NEGATIVE Final   Influenza B by PCR NEGATIVE NEGATIVE Final    Comment: (NOTE) The Xpert Xpress SARS-CoV-2/FLU/RSV plus assay is intended as an aid in the diagnosis of influenza from Nasopharyngeal swab specimens and should not be used as a sole basis for treatment. Nasal washings and aspirates are unacceptable for Xpert Xpress SARS-CoV-2/FLU/RSV testing.  Fact Sheet for Patients: EntrepreneurPulse.com.au  Fact Sheet for  Healthcare Providers: IncredibleEmployment.be  This test is not yet approved or cleared by the [place] FDA and has been authorized for detection and/or diagnosis of SARS-CoV-2 by FDA under an Emergency Use Authorization (EUA). This EUA will remain in effect (meaning this test can be used) for the duration of the COVID-19 declaration under Section 564(b)(1) of the Act, 21 U.S.C. section 360bbb-3(b)(1), unless the authorization is terminated or revoked.  Performed at [facility], [place] [address], [place], [place] [address]   Blood culture (routine x 2)     Status: None   Collection Time: 05/18/21  7:02 PM   Specimen: BLOOD  Result Value Ref Range Status   Specimen Description   Final    BLOOD LEFT ANTECUBITAL Performed at [place] [address] [place], [place] [address]    Special Requests   Final    BOTTLES DRAWN AEROBIC AND ANAEROBIC Blood Culture adequate volume Performed at [place] [address], [place], [place] [address]    Culture   Final    NO GROWTH 5 DAYS Performed at [facility], [place] [address], [place], [place] [address]    Report Status 05/24/2021 FINAL  Final  Blood culture (routine x 2)     Status: None   Collection Time: 05/18/21  7:08 PM   Specimen: BLOOD  Result Value Ref Range Status   Specimen Description   Final    BLOOD RIGHT ANTECUBITAL Performed at [place] [address] [place], [place] [address]    Special Requests   Final    BOTTLES DRAWN AEROBIC AND ANAEROBIC Blood Culture adequate volume Performed at [place] [address] [place], [place] [address]    Culture   Final    NO GROWTH 5 DAYS Performed at [facility], [address] [place], [place] [address]    Report Status 05/24/2021 FINAL  Final  Urine Culture     Status: None   Collection Time: 05/18/21  9:01 PM   Specimen: Urine,  Catheterized  Result Value Ref Range Status   Specimen Description   Final    URINE, CATHETERIZED Performed at [place] [address], [place], [place] [address]    Special Requests   Final    NONE Performed at [facility], [place] [address] [place], [place] [address]    Culture   Final    NO GROWTH Performed at [facility], [place] [address] [place], [place] [address]    Report Status 05/20/2021 FINAL  Final  Gastrointestinal Panel by PCR , Stool     Status: Abnormal   Collection Time: 05/19/21 10:54 PM   Specimen: Stool  Result Value Ref Range Status   Campylobacter species NOT DETECTED NOT DETECTED Final   Plesimonas shigelloides NOT DETECTED NOT DETECTED Final   Salmonella species NOT DETECTED NOT DETECTED Final   Yersinia enterocolitica NOT DETECTED NOT DETECTED Final   Vibrio species NOT DETECTED NOT DETECTED Final   Vibrio cholerae NOT DETECTED NOT DETECTED Final   Enteroaggregative E coli (EAEC) NOT DETECTED NOT DETECTED Final   Enteropathogenic E coli (EPEC) NOT DETECTED NOT DETECTED Final   Enterotoxigenic E coli (ETEC) NOT DETECTED NOT DETECTED Final   Shiga like toxin producing E coli (STEC) NOT DETECTED NOT DETECTED Final   Shigella/Enteroinvasive E coli (EIEC) NOT DETECTED NOT DETECTED Final   Cryptosporidium NOT DETECTED NOT DETECTED Final   Cyclospora cayetanensis NOT DETECTED NOT DETECTED Final   Entamoeba histolytica NOT DETECTED NOT DETECTED Final   Giardia lamblia NOT DETECTED NOT DETECTED Final   Adenovirus F40/41 NOT DETECTED NOT DETECTED Final   Astrovirus NOT DETECTED NOT DETECTED Final   Norovirus GI/GII DETECTED (A) NOT DETECTED Final    Comment: RESULT CALLED TO, READ BACK BY AND VERIFIED WITH: C/REBECCA PULEO 05/20/21 1052 AMK    Rotavirus A NOT DETECTED NOT DETECTED Final   Sapovirus (I, II, IV, and V) NOT DETECTED NOT DETECTED Final    Comment: Performed at [facility], [place] [place], [place] [address]  C Difficile Quick Screen w PCR reflex     Status: Abnormal   Collection Time: 05/19/21 10:54 PM   Specimen: Stool  Result Value Ref Range Status   C Diff antigen NON REACTIVE (A) NEGATIVE Final   C Diff toxin NON REACTIVE (A) NEGATIVE Final   C Diff interpretation VALID  Final    Comment: Performed at [facility], [place] [address] [place], [place] [address]       Imaging Studies   No results found.   Medications   Scheduled Meds:  apixaban  10 mg Oral BID   Followed by   Derrill Memo ON 05/28/2021] apixaban  5 mg Oral BID   feeding supplement  1 Container Oral QID   furosemide  40 mg Intravenous BID   multivitamin with minerals  1 tablet Oral Daily   nicotine  21 mg Transdermal Daily   pantoprazole  40 mg Oral Q0600   sodium bicarbonate  650 mg Oral BID   Continuous Infusions:        LOS: 7 days    Time spent: 30 minutes with > 50% spent at bedside and in coordination of care     Ezekiel Slocumb, DO [facility]  05/26/2021, 2:50 PM      If 7PM-7AM, please contact night-coverage. How to contact the [facility] Attending or Consulting provider [place] or covering provider during after hours [place], for this patient?    Check the care team in [facility] and look for a) attending/consulting [facility] provider listed and b) the [facility] team listed Log into [email or web address] and use [place]'s universal password to access. If you do not have the password, please contact the hospital operator. Locate the [facility] provider you are looking for under [facility] and page to a number that you can be directly reached. If you still have difficulty reaching the provider, please page the [facility] (Director on Call) for the Hospitalists listed on amion for assistance.

## 2021-05-26 NOTE — Plan of Care (Signed)
°  Problem: Clinical Measurements: °Goal: Ability to maintain clinical measurements within normal limits will improve °Outcome: Progressing °Goal: Will remain free from infection °Outcome: Progressing °Goal: Diagnostic test results will improve °Outcome: Progressing °Goal: Respiratory complications will improve °Outcome: Progressing °  °

## 2021-05-27 LAB — BASIC METABOLIC PANEL
Anion gap: 6 (ref 5–15)
BUN: 22 mg/dL — ABNORMAL HIGH (ref 6–20)
CO2: 18 mmol/L — ABNORMAL LOW (ref 22–32)
Calcium: 8.4 mg/dL — ABNORMAL LOW (ref 8.9–10.3)
Chloride: 110 mmol/L (ref 98–111)
Creatinine, Ser: 0.56 mg/dL (ref 0.44–1.00)
GFR, Estimated: >60 mL/min (ref >60)
Glucose, Bld: 94 mg/dL (ref 70–99)
Potassium: 4.4 mmol/L (ref 3.5–5.1)
Sodium: 134 mmol/L — ABNORMAL LOW (ref 135–145)

## 2021-05-27 LAB — MAGNESIUM: Magnesium: 1.8 mg/dL (ref 1.7–2.4)

## 2021-05-27 MED ORDER — APIXABAN 5 MG PO TABS: ORAL_TABLET | ORAL | 1 refills | Status: DC

## 2021-05-27 MED ORDER — ADULT MULTIVITAMIN W/MINERALS CH: 1.0000 | ORAL_TABLET | Freq: Every day | ORAL | Status: DC

## 2021-05-27 MED ORDER — LOPERAMIDE HCL 2 MG PO CAPS: 2.0000 mg | ORAL_CAPSULE | ORAL | 0 refills | Status: AC | PRN

## 2021-05-27 MED ORDER — PANTOPRAZOLE SODIUM 40 MG PO TBEC: 40.0000 mg | DELAYED_RELEASE_TABLET | Freq: Every day | ORAL | 1 refills | Status: DC

## 2021-05-27 NOTE — Progress Notes (Signed)
AVS reviewed with Angela Mcpherson and her husband. RN educated on medication changes, wound care, home health follow-up to pt and pt husband. Pt and pt husband acknowledged understanding. All questions answered and resources on where to obtain [facility] through outside vendors. IV removed and tele d/c. Pt belongings returned.  Oralia Rud, RN

## 2021-05-27 NOTE — Progress Notes (Signed)
Nutrition Follow-up  DOCUMENTATION CODES:   Severe malnutrition in context of chronic illness  INTERVENTION:  - continue Boost Breeze QID.   NUTRITION DIAGNOSIS:   Severe Malnutrition related to chronic illness, cancer and cancer related treatments as evidenced by percent weight loss, severe fat depletion, severe muscle depletion. -ongoing  GOAL:   Patient will meet greater than or equal to 90% of their needs -unmet on average  MONITOR:   PO intake, Supplement acceptance, Labs, Weight trends, Skin  ASSESSMENT:   60 yo female with a PMH of stage 4 rectal cancer. Pt with generalized weakness, watery diarrhea for past week. Last chemo on 05/14/2021. Admitted with AMS.  Diet advanced from CLD to [place] on 10/3 at 1057 and to Soft on 10/4 at 0756. Meal intakes documented since that time are 20-100%. She has been accepting Boost Breeze 75-90% of the time offered over the past 1 week.   She has not been weighed since 10/2. Mild pitting edema to BLE documented in the edema section of flow sheet; it appears edema is decreased compared to 10/8 and 10/9.   Discharge order for d/c home entered late morning but discharge summary has not yet been entered.     Labs reviewed; Na: 134 mmol/l, BUN: 22 mg/dl, Ca: 8.4 mg/dl.  Medications reviewed; 40 mg IV lasix BID, 1 tablet multivitamin with minerals/day, 40 mg oral protonix/day, 650 mg sodium bicarb BID.   Diet Order:   Diet Order             Diet - low sodium heart healthy           DIET SOFT Room service appropriate? Yes; Fluid consistency: Thin  Diet effective now                   EDUCATION NEEDS:   Not appropriate for education at this time  Skin:  Skin Assessment: Skin Integrity Issues: Skin Integrity Issues:: Other (Comment) Other: MASD - L & R buttocks  Last BM:  10/10 (type 6 x1)  Height:   Ht Readings from Last 1 Encounters:  05/19/21 5\' 4"  (1.626 m)    Weight:   Wt Readings from Last 1 Encounters:   05/19/21 49.8 kg     Estimated Nutritional Needs:  Kcal:  1900-2100 Protein:  75-90 grams Fluid:  >1.9 L      Jarome Matin, MS, RD, LDN, CNSC Inpatient Clinical Dietitian RD pager # available in AMION  After hours/weekend pager # available in [facility]

## 2021-05-27 NOTE — Discharge Summary (Signed)
Physician Discharge Summary  Angela Mcpherson HGD:[id number] DOB: 27-Feb-1961 DOA: 05/18/2021  PCP: Angela Merle, MD  Admit date: 05/18/2021 Discharge date: 05/27/2021  Admitted From: home Disposition:  home  Recommendations for Outpatient Follow-up:  Follow up with PCP in 1-2 weeks Please obtain BMP/CBC in one week Please follow up with oncology  Home Health: PT, OT  Equipment/Devices: none  Discharge Condition: stable CODE STATUS: full  Diet recommendation: Soft (low fiber)  Discharge Diagnoses: Principal Problem:   Acute encephalopathy Active Problems:   Malignant neoplasm of rectosigmoid junction (HCC)   Colitis   Dehydration   Aortic mural thrombus (HCC)   Thrombocytopenia (HCC)   Protein-calorie malnutrition, severe   Gastroenteritis due to norovirus    Summary of HPI and Hospital Course:  60 year old female with past medical history of stage IV rectal cancer presented to the ED with altered mental status, generalized weakness and watery diarrhea for about a week.  Husband reported with progressively worsening confusion, poor p.o. intake secondary to nausea vomiting at home.   Evaluation in the ED showed leukocytosis and labs consistent with dehydration.  CT abdomen pelvis with contrast showed a noncalcified thrombus in the infrarenal abdominal aorta causing severe stenosis signs of infectious versus inflammatory enterocolitis (possibly ischemic due to thrombus), small volume ascites, decreased size of rectosigmoid mass and previously identified liver lesions.   Stool studies subsequently positive for norovirus.   Acute metabolic encephalopathy -due to acute infectious colitis and dehydration.  Ammonia only slightly elevated at 38.  CT head was negative. Clinically improving with supportive care and empiric antibiotics (cefepime and Flagyl). --Blood cultures negative -- Clinically stable off antibiotics -- Continue to monitor --Supportive care: Antiemetics, pain control    Hypotonic hyponatremia - Hypervolemic -10/9 pt now with resolved diarrhea but progressive swelling and dyspnea on exertion.   After fluid challenge sodium unchanged at 127.   Fluids were stopped as pt also developed shortness of breath and lower extremity edema.   --Diuresed with IV Lasix and sodium improved. --Advise daily weights at home --Imodium for diarrhea to prevent hypovolemia   Normal anion gap metabolic acidosis -most likely secondary to diarrhea.  Given sodium bicarb tablets twice daily, improved.  Repeat BMP in follow up.    Infrarenal abdominal aortic mural thrombus -patient has been transitioned off heparin infusion to Eliquis. "Scans reviewed by vascular surgeon, Angela Mcpherson who noted that patient does have palpable pedal pulses, no vascular intervention recommended at this time and her current condition with preservation of palpable pedal pulses and no evidence of embolic disease is felt patient does not require open surgery or percutaneous stenting." --Hematology/oncology consulted, see their recommendations. --Continue Eliquis   Acute colitis due to norovirus gastroenteritis -CT of abdomen and pelvis showed wall thickening of the ascending colon, cecum, ileum.  Loose watery stools reported at home prior to admission.  C. difficile negative.  Treated with empiric cefepime and Flagyl. --Off IV fluids, p.o. intake improved --Off antibiotics and clinically improved   Melena/lower GI bleeding -resolved.   Reported by bedside RN morning of 10/5.   Most likely due to either the rectosigmoid mass more likely than enterocolitis, also pressure ulcer present. Bedside RN next day reported patient having brown stool, bleeding appears from ulcerations. Hbg stable.   Hypokalemia -due to GI losses.  Replaced, resolved. Monitor replace as needed.   Hypomagnesemia - replaced.  Resolved. Monitor replace as needed.  Sinus tachycardia - most likely due to dehydration, colitis and acute  illness.   Has been on  IV fluids -stop now that oral intake improved.   Metoprolol as needed if HR sustaining over 110.   Dehydration - Resolved. Present on admission, due to GI losses and poor p.o. intake.   Improved with IV hydration and supportive care. Stopped IV fluids and hydrating well by mouth.   Thrombocytopenia -felt due to consumption of platelets given large aortic mural thrombus.   Platelet trend: 82>> 43>> 63>> 51.... improved to 116.   Metastatic stage IV colon cancer -oncology is following, chemotherapy on hold.   Supportive care and monitor. --Follow up with oncology outpatient       Discharge Instructions   Discharge Instructions     Call MD for:  extreme fatigue   Complete by: As directed    Call MD for:  persistant dizziness or light-headedness   Complete by: As directed    Call MD for:  persistant nausea and vomiting   Complete by: As directed    Call MD for:  severe uncontrolled pain   Complete by: As directed    Call MD for:  temperature >100.4   Complete by: As directed    Diet - low sodium heart healthy   Complete by: As directed    Discharge instructions   Complete by: As directed    Continue to take Eliquis (apixaban - blood thinner) twice daily as prescribed. You will take 10 mg twice daily for another TWO days, then go down to just 5 mg twice daily.  You can continue to use Imodium as needed for diarrhea.    I am not sending you home on any Lasix (diuretic medicine to get rid of fluid).  We gave another dose of that here this morning.  If you notice increased leg and feet swelling, or if you're getting more short of breath with activity again, please contact your Primary Care Provider to let them know.    Increase your physical activity at home slowly, as tolerated.     --Dr. Nicole Kindred, DO   [facility]   Discharge wound care:   Complete by: As directed    Keep area clean and dry as much as possible. Change positions  frequently when sitting or laying down to off-load pressure to the area.   Increase activity slowly   Complete by: As directed       Allergies as of 05/27/2021       Reactions   Codeine    Epinephrine (anaphylaxis)    Orange Fruit [citrus] Other (See Comments)   Blisters on face.   Streptogramins    Tomato Other (See Comments)   Blisters on face        Medication List     STOP taking these medications    prochlorperazine 10 MG tablet Commonly known as: COMPAZINE       TAKE these medications    acetaminophen 325 MG tablet Commonly known as: TYLENOL Take 650 mg by mouth every 6 (six) hours as needed for moderate pain.   apixaban 5 MG Tabs tablet Commonly known as: ELIQUIS Take 2 tablets (10 mg total) by mouth 2 (two) times daily for 2 days, THEN 1 tablet (5 mg total) 2 (two) times daily for 28 days. Start taking on: May 27, 2021   capecitabine 500 MG tablet Commonly known as: XELODA Take 2 tabs in morning and 3 tabs in evening every 12 hours. Take on days 1-14 of chemotherapy. Repeat every 21 days. What changed:  how much to take how to  take this when to take this   cholecalciferol 25 MCG (1000 UNIT) tablet Commonly known as: VITAMIN D3 Take 1,000 Units by mouth daily.   loperamide 2 MG capsule Commonly known as: IMODIUM Take 1 capsule (2 mg total) by mouth as needed for diarrhea or loose stools.   multivitamin with minerals Tabs tablet Take 1 tablet by mouth daily.   ondansetron 8 MG tablet Commonly known as: Zofran Take 1 tablet (8 mg total) by mouth 2 (two) times daily as needed for refractory nausea / vomiting. Start on day 3 after chemotherapy.   pantoprazole 40 MG tablet Commonly known as: PROTONIX Take 1 tablet (40 mg total) by mouth daily at 6 (six) AM. Start taking on: May 28, 2021   traMADol 50 MG tablet Commonly known as: ULTRAM Take 0.5-1 tablets (25-50 mg total) by mouth every 8 (eight) hours as needed. What changed: reasons  to take this   VALERIAN ROOT PO Take 1 tablet by mouth daily.   vitamin C 500 MG tablet Commonly known as: ASCORBIC ACID Take 500 mg by mouth daily.               Discharge Care Instructions  (From admission, onward)           Start     Ordered   05/27/21 0000  Discharge wound care:       Comments: Keep area clean and dry as much as possible. Change positions frequently when sitting or laying down to off-load pressure to the area.   05/27/21 0959            Allergies  Allergen Reactions   Codeine    Epinephrine (Anaphylaxis)    Orange Fruit [Citrus] Other (See Comments)    Blisters on face.   Streptogramins    Tomato Other (See Comments)    Blisters on face     If you experience worsening of your admission symptoms, develop shortness of breath, life threatening emergency, suicidal or homicidal thoughts you must seek medical attention immediately by calling 911 or calling your MD immediately  if symptoms less severe.    Please note   You were cared for by a hospitalist during your hospital stay. If you have any questions about your discharge medications or the care you received while you were in the hospital after you are discharged, you can call the unit and asked to speak with the hospitalist on call if the hospitalist that took care of you is not available. Once you are discharged, your primary care physician will handle any further medical issues. Please note that NO REFILLS for any discharge medications will be authorized once you are discharged, as it is imperative that you return to your primary care physician (or establish a relationship with a primary care physician if you do not have one) for your aftercare needs so that they can reassess your need for medications and monitor your lab values.   Consultations: Hematology/oncolocy   Procedures/Studies: DG Chest 1 View  Result Date: 05/18/2021 CLINICAL DATA:  Altered mental status.  Lethargic.  EXAM: CHEST  1 VIEW COMPARISON:  None. FINDINGS: The heart size and mediastinal contours are within normal limits. Both lungs are clear. The visualized skeletal structures are unremarkable. IMPRESSION: No active disease. Electronically Signed   By: Nolon Nations M.D.   On: 05/18/2021 17:51   CT HEAD WO CONTRAST  Result Date: 05/18/2021 CLINICAL DATA:  Mental status change. EXAM: CT HEAD WITHOUT CONTRAST TECHNIQUE: Contiguous axial images  were obtained from the base of the skull through the vertex without intravenous contrast. COMPARISON:  None. FINDINGS: Brain: Limited exam. Ventricles and sulci are appropriate for patient's age. No evidence for acute cortically based infarct, intracranial hemorrhage, mass lesion or mass-effect. Vascular: Unremarkable Skull: Intact. Sinuses/Orbits: Paranasal sinuses are well aerated. Mastoid air cells are unremarkable. Other: None. IMPRESSION: No acute intracranial process. Limited exam due to patient positioning. Electronically Signed   By: Lovey Newcomer M.D.   On: 05/18/2021 18:53   CT ABDOMEN PELVIS W CONTRAST  Result Date: 05/18/2021 CLINICAL DATA:  Nausea and vomiting. Colorectal cancer. On chemotherapy. EXAM: CT ABDOMEN AND PELVIS WITH CONTRAST TECHNIQUE: Multidetector CT imaging of the abdomen and pelvis was performed using the standard protocol following bolus administration of intravenous contrast. CONTRAST:  59mL OMNIPAQUE IOHEXOL 350 MG/ML SOLN COMPARISON:  PET-CT 03/04/2021. CT chest abdomen and pelvis 02/08/2021. FINDINGS: Lower chest: No acute abnormality. Hepatobiliary: Limited evaluation secondary to motion artifact. Previously identified small liver lesions are not well seen on the study. Gallbladder is grossly within normal limits. Pancreas: Unremarkable. No pancreatic ductal dilatation or surrounding inflammatory changes. Spleen: Normal in size without focal abnormality. Adrenals/Urinary Tract: Adrenal glands are unremarkable. Kidneys are normal, without  renal calculi, focal lesion, or hydronephrosis. Bladder is unremarkable. Stomach/Bowel: There is wall thickening and edema of the ascending colon. There is no surrounding inflammatory stranding. The appendix is within normal limits. There is also wall thickening and submucosal enhancement of ileal loops diffusely. Jejunal loops and stomach are within normal limits. There is no bowel obstruction, free air or pneumatosis identified. Chest there is wall thickening at the rectosigmoid junction in the region of previously identified mass. This has decreased when compared to the prior study. Vascular/Lymphatic: There are atherosclerotic calcifications throughout the aorta. There is no evidence for aneurysm. 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 Previously identified enlarged left pelvic lymph node is now normal in size. No definite new enlarged lymph nodes are identified. Reproductive: Uterus and bilateral adnexa are unremarkable. Other: There is a small amount of ascites. There is no focal abdominal wall hernia. Musculoskeletal: No fracture is seen. IMPRESSION: 1. 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 2. Wall thickening of the ascending colon, cecum and ileum worrisome for enterocolitis. Findings may be infectious, inflammatory or ischemic given findings in the aorta. No pneumatosis or free air. 3. Small amount of ascites. 4. Rectosigmoid mass has decreased in size. Left pelvic lymph node has decreased in size. 5. Previously identified liver lesions not well characterized on this study secondary to motion artifact. Electronically Signed   By: Ronney Asters M.D.   On: 05/18/2021 19:57   DG CHEST PORT 1 VIEW  Result Date: 05/26/2021 CLINICAL DATA:  Short of breath.  History of stage IV rectal cancer. EXAM: PORTABLE CHEST 1 VIEW COMPARISON:  May 18, 2021 FINDINGS: Cardiomediastinal silhouette is normal.  Mediastinal contours appear intact. Tortuosity of the aorta. Low lung volumes. Peribronchovascular thickening in bilateral lungs more pronounced on the left. Osseous structures are without acute abnormality. Soft tissues are grossly normal. IMPRESSION: 1. Low lung volumes. 2. Peribronchovascular thickening in bilateral lungs more pronounced on the left, which may be seen with asymmetric pulmonary edema, bronchitic changes, or lymphangitic spread of malignancy. Electronically Signed   By: Fidela Salisbury M.D.   On: 05/26/2021 16:53       Subjective: Pt feels much better this AM.  Leg swelling improved and  dyspnea also better.  Pt very happy to be going home.  She denies acute complaints.    Discharge Exam: Vitals:   05/26/21 2021 05/27/21 0402  BP: 105/82 103/75  Pulse: (!) 107 (!) 104  Resp: 18 18  Temp:  97.8 F (36.6 C)  SpO2:  95%   Vitals:   05/26/21 1112 05/26/21 1451 05/26/21 2021 05/27/21 0402  BP: 105/80 112/86 105/82 103/75  Pulse: 99  (!) 107 (!) 104  Resp: 16  18 18   Temp: 97.7 F (36.5 C)   97.8 F (36.6 C)  TempSrc: Oral   Oral  SpO2: 99%   95%  Weight:      Height:        General: Pt is alert, awake, not in acute distress, chronically ill-appearing, frail Cardiovascular: RRR, S1/S2 +, no rubs, no gallops Respiratory: CTA bilaterally, no wheezing, no rhonchi Abdominal: soft with improved mild distention, non-tender Extremities: improved BLE edema, no cyanosis    The results of significant diagnostics from this hospitalization (including imaging, microbiology, ancillary and laboratory) are listed below for reference.     Microbiology: Recent Results (from the past 240 hour(s))  Resp Panel by RT-PCR (Flu A&B, Covid) Nasopharyngeal Swab     Status: None   Collection Time: 05/18/21  5:50 PM   Specimen: Nasopharyngeal Swab; Nasopharyngeal(NP) swabs in vial transport medium  Result Value Ref Range Status   SARS Coronavirus 2 by RT PCR NEGATIVE NEGATIVE  Final    Comment: (NOTE) SARS-CoV-2 target nucleic acids are NOT DETECTED.  The SARS-CoV-2 RNA is generally detectable in upper respiratory specimens during the acute phase of infection. The lowest concentration of SARS-CoV-2 viral copies this assay can detect is 138 copies/mL. A negative result does not preclude SARS-Cov-2 infection and should not be used as the sole basis for treatment or other patient management decisions. A negative result may occur with  improper specimen collection/handling, submission of specimen other than nasopharyngeal swab, presence of viral mutation(s) within the areas targeted by this assay, and inadequate number of viral copies(<138 copies/mL). A negative result must be combined with clinical observations, patient history, and epidemiological information. The expected result is Negative.  Fact Sheet for Patients:  EntrepreneurPulse.com.au  Fact Sheet for Healthcare Providers:  IncredibleEmployment.be  This test is no t yet approved or cleared by the [place] FDA and  has been authorized for detection and/or diagnosis of SARS-CoV-2 by FDA under an Emergency Use Authorization (EUA). This EUA will remain  in effect (meaning this test can be used) for the duration of the COVID-19 declaration under Section 564(b)(1) of the Act, 21 U.S.C.section 360bbb-3(b)(1), unless the authorization is terminated  or revoked sooner.       Influenza A by PCR NEGATIVE NEGATIVE Final   Influenza B by PCR NEGATIVE NEGATIVE Final    Comment: (NOTE) The Xpert Xpress SARS-CoV-2/FLU/RSV plus assay is intended as an aid in the diagnosis of influenza from Nasopharyngeal swab specimens and should not be used as a sole basis for treatment. Nasal washings and aspirates are unacceptable for Xpert Xpress SARS-CoV-2/FLU/RSV testing.  Fact Sheet for Patients: EntrepreneurPulse.com.au  Fact Sheet for Healthcare  Providers: IncredibleEmployment.be  This test is not yet approved or cleared by the [place] FDA and has been authorized for detection and/or diagnosis of SARS-CoV-2 by FDA under an Emergency Use Authorization (EUA). This EUA will remain in effect (meaning this test can be used) for the duration of the COVID-19 declaration under Section 564(b)(1) of the Act,  21 U.S.C. section 360bbb-3(b)(1), unless the authorization is terminated or revoked.  Performed at [facility], [place] [address], [place], [place] [address]   Blood culture (routine x 2)     Status: None   Collection Time: 05/18/21  7:02 PM   Specimen: BLOOD  Result Value Ref Range Status   Specimen Description   Final    BLOOD LEFT ANTECUBITAL Performed at [place] [address], [place], [place] [address]    Special Requests   Final    BOTTLES DRAWN AEROBIC AND ANAEROBIC Blood Culture adequate volume Performed at [place] [address], [place], [place] [address]    Culture   Final    NO GROWTH 5 DAYS Performed at [facility], [place] [address], [place], [place] [address]    Report Status 05/24/2021 FINAL  Final  Blood culture (routine x 2)     Status: None   Collection Time: 05/18/21  7:08 PM   Specimen: BLOOD  Result Value Ref Range Status   Specimen Description   Final    BLOOD RIGHT ANTECUBITAL Performed at [place] [address] [place], [place] [address]    Special Requests   Final    BOTTLES DRAWN AEROBIC AND ANAEROBIC Blood Culture adequate volume Performed at [place] [address], [place], [place] [address]    Culture   Final    NO GROWTH 5 DAYS Performed at [facility], [place] [address], [place], [place] [address]    Report Status 05/24/2021 FINAL  Final  Urine Culture     Status: None   Collection Time: 05/18/21  9:01 PM   Specimen: Urine,  Catheterized  Result Value Ref Range Status   Specimen Description   Final    URINE, CATHETERIZED Performed at [place] [address] [place], [place] [address]    Special Requests   Final    NONE Performed at [facility], [place] [address], [place], [place] [address]    Culture   Final    NO GROWTH Performed at [facility], [place] [address], [place], [place] [address]    Report Status 05/20/2021 FINAL  Final  Gastrointestinal Panel by PCR , Stool     Status: Abnormal   Collection Time: 05/19/21 10:54 PM   Specimen: Stool  Result Value Ref Range Status   Campylobacter species NOT DETECTED NOT DETECTED Final   Plesimonas shigelloides NOT DETECTED NOT DETECTED Final   Salmonella species NOT DETECTED NOT DETECTED Final   Yersinia enterocolitica NOT DETECTED NOT DETECTED Final   Vibrio species NOT DETECTED NOT DETECTED Final   Vibrio cholerae NOT DETECTED NOT DETECTED Final   Enteroaggregative E coli (EAEC) NOT DETECTED NOT DETECTED Final   Enteropathogenic E coli (EPEC) NOT DETECTED NOT DETECTED Final   Enterotoxigenic E coli (ETEC) NOT DETECTED NOT DETECTED Final   Shiga like toxin producing E coli (STEC) NOT DETECTED NOT DETECTED Final   Shigella/Enteroinvasive E coli (EIEC) NOT DETECTED NOT DETECTED Final   Cryptosporidium NOT DETECTED NOT DETECTED Final   Cyclospora cayetanensis NOT DETECTED NOT DETECTED Final   Entamoeba histolytica NOT DETECTED NOT DETECTED Final   Giardia lamblia NOT DETECTED NOT DETECTED Final   Adenovirus F40/41 NOT DETECTED NOT DETECTED Final   Astrovirus NOT DETECTED NOT DETECTED Final   Norovirus GI/GII DETECTED (A) NOT DETECTED Final    Comment: RESULT CALLED TO, READ  BACK BY AND VERIFIED WITH: C/REBECCA PULEO 05/20/21 1052 AMK    Rotavirus A NOT DETECTED NOT DETECTED Final   Sapovirus (I, II, IV, and V) NOT DETECTED NOT DETECTED Final    Comment: Performed at [facility], [place], [place] [address]  C Difficile Quick Screen w PCR reflex     Status: Abnormal   Collection Time: 05/19/21 10:54 PM   Specimen: Stool  Result Value Ref Range Status   C Diff antigen NON REACTIVE (A) NEGATIVE Final   C Diff toxin NON REACTIVE (A) NEGATIVE Final   C Diff interpretation VALID  Final    Comment: Performed at [facility], [place] [address], [place], [place] [address]     Labs: BNP (last 3 results) Recent Labs    05/26/21 1206  BNP 40.3   Basic Metabolic Panel: Recent Labs  Lab 05/22/21 0354 05/24/21 0406 05/24/21 1400 05/25/21 0405 05/25/21 1646 05/26/21 0759 05/26/21 1206 05/26/21 1700 05/27/21 0406  NA 130*   < > 126* 130* 126* 127* 127* 134* 134*  K 3.6   < > 3.8 3.5 4.2 4.6 4.6 5.0 4.4  CL 105   < > 103 107 104 105 105 110 110  CO2 18*   < > 17* 15* 17* 16* 16* 17* 18*  GLUCOSE 146*   < > 112* 152* 117* 103* 98 106* 94  BUN 24*   < > 29* 23* 22* 24* 23* 22* 22*  CREATININE 0.64   < > 0.72 0.66 0.69 0.59 0.63 0.58 0.56  CALCIUM 7.9*   < > 7.6* 7.9* 7.9* 7.9* 7.8* 8.4* 8.4*  MG 2.0  --  1.6* 2.0  --  2.0  --   --  1.8  PHOS  --   --   --   --  2.9  --   --   --   --    < > = values in this interval not displayed.   Liver Function Tests: Recent Labs  Lab 05/26/21 1206 05/26/21 1700  AST 32 34  ALT 28 30  ALKPHOS 102 100  BILITOT 0.9 0.8  PROT 4.8* 5.2*  ALBUMIN 1.9* 2.3*   No results for input(s): LIPASE, AMYLASE in the last 168 hours. No results for input(s): AMMONIA in the last 168 hours. CBC: Recent Labs  Lab 05/21/21 0358 05/22/21 0354 05/22/21 1451 05/24/21 0406 05/25/21 0405 05/26/21 0759  WBC 21.6* 24.4*  --  23.0* 20.8* 16.7*  NEUTROABS  --  20.5*  --   --   --   --   HGB 13.4 11.7* 12.0 10.3* 11.1* 11.3*  HCT 38.0 32.9* 33.7* 28.3* 31.7* 31.6*  MCV 95.7 96.2  --  93.7 96.6 96.3  PLT 63* 51*  --  81* 88* 116*   Cardiac Enzymes: No results for input(s): CKTOTAL, CKMB, CKMBINDEX, TROPONINI in the last  168 hours. BNP: Invalid input(s): POCBNP CBG: Recent Labs  Lab 05/21/21 2108 05/25/21 0838  GLUCAP 135* 120*   D-Dimer No results for input(s): DDIMER in the last 72 hours. Hgb A1c No results for input(s): HGBA1C in the last 72 hours. Lipid Profile No results for input(s): CHOL, HDL, LDLCALC, TRIG, CHOLHDL, LDLDIRECT in the last 72 hours. Thyroid function studies No results for input(s): TSH, T4TOTAL, T3FREE, THYROIDAB in the last 72 hours.  Invalid input(s): FREET3 Anemia work up No results for input(s): VITAMINB12, FOLATE, FERRITIN, TIBC, IRON, RETICCTPCT in the last 72 hours. Urinalysis  Component Value Date/Time   COLORURINE YELLOW 05/18/2021 2101   APPEARANCEUR CLEAR 05/18/2021 2101   LABSPEC >1.046 (H) 05/18/2021 2101   PHURINE 6.0 05/18/2021 2101   GLUCOSEU NEGATIVE 05/18/2021 2101   HGBUR NEGATIVE 05/18/2021 2101   [place] NEGATIVE 05/18/2021 2101   [place] NEGATIVE 05/18/2021 2101   PROTEINUR NEGATIVE 05/18/2021 2101   NITRITE NEGATIVE 05/18/2021 2101   LEUKOCYTESUR NEGATIVE 05/18/2021 2101   Sepsis Labs Invalid input(s): PROCALCITONIN,  WBC,  LACTICIDVEN Microbiology Recent Results (from the past 240 hour(s))  Resp Panel by RT-PCR (Flu A&B, Covid) Nasopharyngeal Swab     Status: None   Collection Time: 05/18/21  5:50 PM   Specimen: Nasopharyngeal Swab; Nasopharyngeal(NP) swabs in vial transport medium  Result Value Ref Range Status   SARS Coronavirus 2 by RT PCR NEGATIVE NEGATIVE Final    Comment: (NOTE) SARS-CoV-2 target nucleic acids are NOT DETECTED.  The SARS-CoV-2 RNA is generally detectable in upper respiratory specimens during the acute phase of infection. The lowest concentration of SARS-CoV-2 viral copies this assay can detect is 138 copies/mL. A negative result does not preclude SARS-Cov-2 infection and should not be used as the sole basis for treatment or other patient management decisions. A negative result may occur with  improper  specimen collection/handling, submission of specimen other than nasopharyngeal swab, presence of viral mutation(s) within the areas targeted by this assay, and inadequate number of viral copies(<138 copies/mL). A negative result must be combined with clinical observations, patient history, and epidemiological information. The expected result is Negative.  Fact Sheet for Patients:  EntrepreneurPulse.com.au  Fact Sheet for Healthcare Providers:  IncredibleEmployment.be  This test is no t yet approved or cleared by the [place] FDA and  has been authorized for detection and/or diagnosis of SARS-CoV-2 by FDA under an Emergency Use Authorization (EUA). This EUA will remain  in effect (meaning this test can be used) for the duration of the COVID-19 declaration under Section 564(b)(1) of the Act, 21 U.S.C.section 360bbb-3(b)(1), unless the authorization is terminated  or revoked sooner.       Influenza A by PCR NEGATIVE NEGATIVE Final   Influenza B by PCR NEGATIVE NEGATIVE Final    Comment: (NOTE) The Xpert Xpress SARS-CoV-2/FLU/RSV plus assay is intended as an aid in the diagnosis of influenza from Nasopharyngeal swab specimens and should not be used as a sole basis for treatment. Nasal washings and aspirates are unacceptable for Xpert Xpress SARS-CoV-2/FLU/RSV testing.  Fact Sheet for Patients: EntrepreneurPulse.com.au  Fact Sheet for Healthcare Providers: IncredibleEmployment.be  This test is not yet approved or cleared by the [place] FDA and has been authorized for detection and/or diagnosis of SARS-CoV-2 by FDA under an Emergency Use Authorization (EUA). This EUA will remain in effect (meaning this test can be used) for the duration of the COVID-19 declaration under Section 564(b)(1) of the Act, 21 U.S.C. section 360bbb-3(b)(1), unless the authorization is terminated or revoked.  Performed at  [facility], [place] [address] [place], [place] [address]   Blood culture (routine x 2)     Status: None   Collection Time: 05/18/21  7:02 PM   Specimen: BLOOD  Result Value Ref Range Status   Specimen Description   Final    BLOOD LEFT ANTECUBITAL Performed at [facility] [address] [place], [place] [address]    Special Requests   Final    BOTTLES DRAWN AEROBIC AND ANAEROBIC Blood Culture adequate volume Performed at [place] [facility]  [address], [place], [place] [address]    Culture   Final    NO GROWTH 5 DAYS Performed at [facility], [place] [address], [place], [place] [address]    Report Status 05/24/2021 FINAL  Final  Blood culture (routine x 2)     Status: None   Collection Time: 05/18/21  7:08 PM   Specimen: BLOOD  Result Value Ref Range Status   Specimen Description   Final    BLOOD RIGHT ANTECUBITAL Performed at [facility] [address] [place], [place] [address]    Special Requests   Final    BOTTLES DRAWN AEROBIC AND ANAEROBIC Blood Culture adequate volume Performed at [facility] [address] [place], [place] [address]    Culture   Final    NO GROWTH 5 DAYS Performed at [facility], [place] [address], [place], [place] [address]    Report Status 05/24/2021 FINAL  Final  Urine Culture     Status: None   Collection Time: 05/18/21  9:01 PM   Specimen: Urine, Catheterized  Result Value Ref Range Status   Specimen Description   Final    URINE, CATHETERIZED Performed at [facility] [address], [place], [place] [address]    Special Requests   Final    NONE Performed at [facility], [place] [address] [place], [place] [address]    Culture   Final    NO GROWTH Performed at [facility], [place] [address] [place], [place] [address]    Report Status 05/20/2021 FINAL  Final  Gastrointestinal Panel by  PCR , Stool     Status: Abnormal   Collection Time: 05/19/21 10:54 PM   Specimen: Stool  Result Value Ref Range Status   Campylobacter species NOT DETECTED NOT DETECTED Final   Plesimonas shigelloides NOT DETECTED NOT DETECTED Final   Salmonella species NOT DETECTED NOT DETECTED Final   Yersinia enterocolitica NOT DETECTED NOT DETECTED Final   Vibrio species NOT DETECTED NOT DETECTED Final   Vibrio cholerae NOT DETECTED NOT DETECTED Final   Enteroaggregative E coli (EAEC) NOT DETECTED NOT DETECTED Final   Enteropathogenic E coli (EPEC) NOT DETECTED NOT DETECTED Final   Enterotoxigenic E coli (ETEC) NOT DETECTED NOT DETECTED Final   Shiga like toxin producing E coli (STEC) NOT DETECTED NOT DETECTED Final   Shigella/Enteroinvasive E coli (EIEC) NOT DETECTED NOT DETECTED Final   Cryptosporidium NOT DETECTED NOT DETECTED Final   Cyclospora cayetanensis NOT DETECTED NOT DETECTED Final   Entamoeba histolytica NOT DETECTED NOT DETECTED Final   Giardia lamblia NOT DETECTED NOT DETECTED Final   Adenovirus F40/41 NOT DETECTED NOT DETECTED Final   Astrovirus NOT DETECTED NOT DETECTED Final   Norovirus GI/GII DETECTED (A) NOT DETECTED Final    Comment: RESULT CALLED TO, READ BACK BY AND VERIFIED WITH: C/REBECCA PULEO 05/20/21 1052 AMK    Rotavirus A NOT DETECTED NOT DETECTED Final   Sapovirus (I, II, IV, and V) NOT DETECTED NOT DETECTED Final    Comment: Performed at [facility], [place] [place], [place] [address]  C Difficile Quick Screen w PCR reflex     Status: Abnormal   Collection Time: 05/19/21 10:54 PM   Specimen: Stool  Result Value Ref Range Status   C Diff antigen NON REACTIVE (A) NEGATIVE Final   C Diff toxin NON REACTIVE (A) NEGATIVE Final   C Diff interpretation VALID  Final  Comment: Performed at [facility], [place] [address], [place], [place] [address]     Time coordinating discharge: Over 30 minutes  SIGNED:   Ezekiel Slocumb,  DO [facility] 05/27/2021, 10:00 AM   If 7PM-7AM, please contact night-coverage [email or web address]

## 2021-05-27 NOTE — TOC Transition Note (Addendum)
Transition of Care [facility]) - CM/SW Discharge Note   Patient Details  Name: Angela Mcpherson MRN: [id number] Date of Birth: [birth year]  Transition of Care [facility]) CM/SW Contact:  Ross Ludwig, LCSW Phone Number: 05/27/2021, 1:55 PM   Clinical Narrative:     CSW unable to find a home health care agency at this time, CSW informed patient that CSW will continue looking for agency that will accept [facility] PT and OT.  Patient will need a bedside commode, CSW advised them that insurance may not pay for it, family will purchase one from a third party.  CSW informed bedside nurse that [employer], [place], [employer], [facility], and sometimes even [employer] has them available for purchase.  CSW signing off and will updated patient if [facility] agency agrees to accept patient.  3:00pm  CSW spoke to [place] and they can accept patient for home health PT and OT.  CSW called patient's cell phone and left information on her voice mail.  CSW also added to AVS paperwork.   Final next level of care: [place] Barriers to Discharge: Barriers Resolved   Patient Goals and CMS Choice Patient states their goals for this hospitalization and ongoing recovery are:: To return back home with home health services. CMS Medicare.gov Compare Post Acute Care list provided to:: Patient Choice offered to / list presented to : Patient  Discharge Placement                       Discharge Plan and Services                DME Arranged: Bedside commode DME Agency: Other - Comment (Family will purchase bedside commode on their own.)       HH Arranged: PT, OT HH Agency: Other - See comment        Social Determinants of Health (SDOH) Interventions     Readmission Risk Interventions No flowsheet data found.

## 2021-05-27 NOTE — Discharge Instr - Supplementary Instructions (Signed)
Bedside commode can be purchased from:   [facility] [place]  [phone]   [facility] [address] [phone]   [facility] [place], [employer], [place], [place], [place], and sometimes [employer] or [employer].

## 2021-05-28 ENCOUNTER — Encounter: Payer: Self-pay | Admitting: Hematology

## 2021-05-28 LAB — CEA: CEA: 7.7 ng/mL — ABNORMAL HIGH (ref 0.0–4.7)

## 2021-06-04 MED FILL — Dexamethasone Sodium Phosphate Inj 100 MG/10ML: INTRAMUSCULAR | Qty: 1 | Status: AC

## 2021-06-05 ENCOUNTER — Inpatient Hospital Stay: Payer: 59

## 2021-06-05 ENCOUNTER — Telehealth: Payer: Self-pay

## 2021-06-05 ENCOUNTER — Other Ambulatory Visit: Payer: Self-pay

## 2021-06-05 ENCOUNTER — Inpatient Hospital Stay: Payer: 59 | Admitting: Hematology

## 2021-06-05 MED ORDER — FUROSEMIDE 20 MG PO TABS: 40.0000 mg | ORAL_TABLET | Freq: Every day | ORAL | 0 refills | Status: DC

## 2021-06-05 MED ORDER — POTASSIUM CHLORIDE CRYS ER 20 MEQ PO TBCR: 20.0000 meq | EXTENDED_RELEASE_TABLET | Freq: Every day | ORAL | 0 refills | Status: DC

## 2021-06-05 NOTE — Progress Notes (Signed)
Verbal order given for Potassium and Furosemide.  Followed up with pt via telephone.

## 2021-06-05 NOTE — Telephone Encounter (Signed)
This nurse returned call to [facility] from [facility] PT who was requesting to extend physical therapy orders.  This nurse spoke with MD who is in agreement  with extending orders.  No further questions or concerns at this time.

## 2021-06-05 NOTE — Telephone Encounter (Signed)
Open for chart review no changes made. 

## 2021-06-13 ENCOUNTER — Other Ambulatory Visit: Payer: Self-pay | Admitting: Hematology

## 2021-06-13 NOTE — Telephone Encounter (Signed)
Spoke w/pt's husband via telephone refill request Furosemide 40mg  daily.  Prescription was initially sent to pt's preferred pharmacy on 05/31/2021.  Pt's husband verbally confirmed that the Lasix prescription and [place] prescriptions were both picked up and that the pt is taking them both.  Denied refill request for Lasix at this time.

## 2021-06-21 ENCOUNTER — Encounter: Payer: Self-pay | Admitting: Hematology

## 2021-06-21 ENCOUNTER — Inpatient Hospital Stay: Payer: 59 | Attending: Hematology

## 2021-06-21 ENCOUNTER — Other Ambulatory Visit: Payer: Self-pay

## 2021-06-21 ENCOUNTER — Inpatient Hospital Stay: Payer: 59 | Admitting: Hematology

## 2021-06-21 VITALS — BP 106/94 | HR 98 | Temp 98.0°F | Resp 18 | Ht 64.0 in | Wt 109.0 lb

## 2021-06-21 DIAGNOSIS — C19 Malignant neoplasm of rectosigmoid junction: Secondary | ICD-10-CM | POA: Diagnosis not present

## 2021-06-21 DIAGNOSIS — I219 Acute myocardial infarction, unspecified: Secondary | ICD-10-CM | POA: Insufficient documentation

## 2021-06-21 DIAGNOSIS — Z7901 Long term (current) use of anticoagulants: Secondary | ICD-10-CM | POA: Diagnosis not present

## 2021-06-21 DIAGNOSIS — K529 Noninfective gastroenteritis and colitis, unspecified: Secondary | ICD-10-CM | POA: Diagnosis not present

## 2021-06-21 DIAGNOSIS — R188 Other ascites: Secondary | ICD-10-CM | POA: Insufficient documentation

## 2021-06-21 DIAGNOSIS — G9340 Encephalopathy, unspecified: Secondary | ICD-10-CM | POA: Insufficient documentation

## 2021-06-21 DIAGNOSIS — D696 Thrombocytopenia, unspecified: Secondary | ICD-10-CM | POA: Insufficient documentation

## 2021-06-21 LAB — CMP (CANCER CENTER ONLY)
ALT: 34 U/L (ref 0–44)
AST: 38 U/L (ref 15–41)
Albumin: 2.4 g/dL — ABNORMAL LOW (ref 3.5–5.0)
Alkaline Phosphatase: 159 U/L — ABNORMAL HIGH (ref 38–126)
Anion gap: 10 (ref 5–15)
BUN: 5 mg/dL — ABNORMAL LOW (ref 6–20)
CO2: 26 mmol/L (ref 22–32)
Calcium: 8.1 mg/dL — ABNORMAL LOW (ref 8.9–10.3)
Chloride: 101 mmol/L (ref 98–111)
Creatinine: 0.55 mg/dL (ref 0.44–1.00)
GFR, Estimated: >60 mL/min (ref >60)
Glucose, Bld: 107 mg/dL — ABNORMAL HIGH (ref 70–99)
Potassium: 3.2 mmol/L — ABNORMAL LOW (ref 3.5–5.1)
Sodium: 137 mmol/L (ref 135–145)
Total Bilirubin: 0.8 mg/dL (ref 0.3–1.2)
Total Protein: 6.2 g/dL — ABNORMAL LOW (ref 6.5–8.1)

## 2021-06-21 LAB — CBC WITH DIFFERENTIAL (CANCER CENTER ONLY)
Abs Immature Granulocytes: 0.02 10*3/uL (ref 0.00–0.07)
Basophils Absolute: 0 10*3/uL (ref 0.0–0.1)
Basophils Relative: 1 %
Eosinophils Absolute: 0 10*3/uL (ref 0.0–0.5)
Eosinophils Relative: 1 %
HCT: 31.9 % — ABNORMAL LOW (ref 36.0–46.0)
Hemoglobin: 10.6 g/dL — ABNORMAL LOW (ref 12.0–15.0)
Immature Granulocytes: 0 %
Lymphocytes Relative: 18 %
Lymphs Abs: 1.2 10*3/uL (ref 0.7–4.0)
MCH: 31.5 pg (ref 26.0–34.0)
MCHC: 33.2 g/dL (ref 30.0–36.0)
MCV: 94.9 fL (ref 80.0–100.0)
Monocytes Absolute: 0.6 10*3/uL (ref 0.1–1.0)
Monocytes Relative: 9 %
Neutro Abs: 4.7 10*3/uL (ref 1.7–7.7)
Neutrophils Relative %: 71 %
Platelet Count: 153 10*3/uL (ref 150–400)
RBC: 3.36 MIL/uL — ABNORMAL LOW (ref 3.87–5.11)
RDW: 16.5 % — ABNORMAL HIGH (ref 11.5–15.5)
WBC Count: 6.6 10*3/uL (ref 4.0–10.5)
nRBC: 0 % (ref 0.0–0.2)

## 2021-06-21 LAB — TOTAL PROTEIN, URINE DIPSTICK: Protein, ur: NEGATIVE mg/dL

## 2021-06-21 LAB — CEA (IN HOUSE-CHCC): CEA (CHCC-In House): 12.34 ng/mL — ABNORMAL HIGH (ref 0.00–5.00)

## 2021-06-21 MED ORDER — PANTOPRAZOLE SODIUM 40 MG PO TBEC
40.0000 mg | DELAYED_RELEASE_TABLET | Freq: Every day | ORAL | 1 refills | Status: DC
Start: 2021-06-21 — End: 2021-07-03

## 2021-06-21 MED ORDER — APIXABAN 5 MG PO TABS: ORAL_TABLET | ORAL | 1 refills | Status: DC

## 2021-06-21 NOTE — Progress Notes (Signed)
[place]   Telephone:([phone] Fax:([phone]) [phone]   Clinic Follow up Note   Patient Care Team: Truitt Merle, MD as PCP - General (Hematology) Truitt Merle, MD as Consulting Physician (Oncology) Milus Banister, MD as Attending Physician (Gastroenterology) Kyung Rudd, MD as Consulting Physician (Radiation Oncology)  Date of Service:  06/21/2021  CHIEF COMPLAINT: f/u of metastatic colon cancer  CURRENT THERAPY:  CAPOX and bevacizumab, starting 03/13/21  ASSESSMENT & PLAN:  Angela Mcpherson is a 60 y.o. female with   1. Acute thrombosis and new ascites  -admitted 05/18/21 with acute encephalopathy, colitis, abdominal aorta mural thrombosis, and thrombocytopenia. -she is on Eliquis and will continue  -she has abdominal swelling. I will order paracentesis.  2. 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 -she was clinically doing well, tumor marker CEA has dropped significantly since she started chemo, indicating good response to treatment.  Her rectal symptom also improved. -she was recenetly hospitalized. we will hold chemo until she has improved well   3. Goal of care discussion -We previously discussed the incurable nature of her cancer, and the overall poor prognosis, especially if she does not have good response to chemotherapy or progress on chemo -The patient understands the goal of care is palliative. -she previously agreed with DNR, but there wishes to revers to full code now, since she is doing better with chemotherapy.  I think that is reasonable.      Plan -continue to hold chemo for now -I ordered paracentesis to be done next week, will send cytology  -lab and f/u on 11/14    No problem-specific Assessment & Plan notes found for this encounter.   SUMMARY OF ONCOLOGIC HISTORY: Oncology History Overview Note  Cancer Staging Malignant neoplasm of rectosigmoid junction [facility]) Staging form: Colon and Rectum, AJCC 8th Edition - Clinical stage from 02/04/2021: Stage IVB (cTX, cN2, cM1b) - Signed by Truitt Merle, MD on 02/12/2021 Stage prefix: Initial diagnosis    Malignant neoplasm of rectosigmoid junction ([place])  02/04/2021 Procedure   Colonoscopy by Dr Ardis Hughs 02/04/21 IMPRESSION - Two 2 to 6 mm polyps in the sigmoid colon and in the cecum, removed with a cold snare. Resected and retrieved. - A fungating, infiltrative and ulcerated partially obstructing mass was found in the rectosigmoid colon (distal edge about 7cm from the anus). The mass was partially circumferential (involving one-half of the lumen circumference). The mass measured eight cm in length. This was biopsied with a cold forceps for histology. The distal edge of the mass was tattooed with two submucosal injections of Spot (carbon black). Sigmoid colon felt somewhat fixed in place (large CRC or perhaps invasion into colon from other primary site?) - Internal hemorrhoids. - The examination was otherwise normal on direct and retroflexion views.   02/04/2021 Pathology Results   Diagnosis 1. 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 - NO HIGH-GRADE DYSPLASIA OR CARCINOMA. 2. Colon, biopsy, rectosigmoid - INVASIVE ADENOCARCINOMA. - SEE NOTE.   02/04/2021 Cancer Staging   Staging form: Colon and Rectum, AJCC 8th Edition - Clinical stage from 02/04/2021: Stage IVB (cTX, cN2, cM1b) -  Signed by Truitt Merle, MD on 02/12/2021 Stage prefix: Initial diagnosis    02/08/2021 Imaging   CT CHEST, ABDOMEN AND PELVIS W CONTRAST  IMPRESSION: 1. There is a large,  concentric mass of the rectosigmoid junction measuring 7.8 x 5.9 x 5.2 cm. Findings are consistent with primary colon malignancy identified by colonoscopy. 2. Multiple enlarged perirectal, bilateral pelvic sidewall, and retroperitoneal lymph nodes, consistent with nodal metastatic disease. 3. There are multiple small low-attenuation lesions of the liver parenchyma, incompletely characterized although suspicious for hepatic metastatic disease. PET-CT or multiphasic contrast enhanced MRI could be used to more clearly characterize. 4. There are multiple small bilateral pulmonary nodules, measuring 3 mm and smaller, nonspecific although highly suspicious for early pulmonary metastatic disease given overall constellation of findings.   02/12/2021 Initial Diagnosis   Malignant neoplasm of rectosigmoid junction ([place])   03/04/2021 PET scan   IMPRESSION: 1. Intense hypermetabolic activity associated with rectal mass consistent with primary colorectal carcinoma. 2. Local extension of primary tumor into the LEFT perirectal fat. 3. Hypermetabolic LEFT operator node consistent with local nodal metastasis. 4. Hypermetabolic metastasis to the LEFT and RIGHT hepatic lobe (two lesions). 5. Hypermetabolic periportal lymph node. 6. Small pulmonary nodules without radiotracer activity. Recommend attention on follow-up.   03/13/2021 -  Chemotherapy    Patient is on Treatment Plan: COLORECTAL XELOX (CAPEOX) Q21D        Genetic Testing   Negative genetic testing. No pathogenic variants identified on the [facility] Multi-Cancer+RNA Panel. 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 The report date is 04/01/2021.  The Multi-Cancer Panel + RNA offered by [facility] includes sequencing and/or deletion duplication testing of the following 84 genes: AIP, ALK, APC, ATM, AXIN2,BAP1,  BARD1, BLM, BMPR1A, BRCA1, BRCA2, BRIP1, CASR, CDC73, CDH1, CDK4, CDKN1B, CDKN1C, CDKN2A (p14ARF), CDKN2A (p16INK4a), CEBPA, CHEK2,  CTNNA1, DICER1, DIS3L2, EGFR (c.2369C>T, p.Thr790Met variant only), EPCAM (Deletion/duplication testing only), FH, FLCN, GATA2, GPC3, GREM1 (Promoter region deletion/duplication testing only), HOXB13 (c.251G>A, p.Gly84Glu), HRAS, KIT, MAX, MEN1, MET, MITF (c.952G>A, p.Glu318Lys variant only), MLH1, MSH2, MSH3, MSH6, MUTYH, NBN, NF1, NF2, NTHL1, PALB2, PDGFRA, PHOX2B, PMS2, POLD1, POLE, POT1, PRKAR1A, PTCH1, PTEN, RAD50, RAD51C, RAD51D, RB1, RECQL4, RET, RUNX1, SDHAF2, SDHA (sequence changes only), SDHB, SDHC, SDHD, SMAD4, SMARCA4, SMARCB1, SMARCE1, STK11, SUFU, TERC, TERT, TMEM127, TP53, TSC1, TSC2, VHL, WRN and WT1.   05/18/2021 Imaging   CT AP  IMPRESSION: 1. 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 2. Wall thickening of the ascending colon, cecum and ileum worrisome for enterocolitis. Findings may be infectious, inflammatory or ischemic given findings in the aorta. No pneumatosis or free air. 3. Small amount of ascites. 4. Rectosigmoid mass has decreased in size. Left pelvic lymph node has decreased in size. 5. Previously identified liver lesions not well characterized on this study secondary to motion artifact.      INTERVAL HISTORY:  Angela Mcpherson is here for a follow up of metastatic colon cancer. She was last seen by me on 05/23/21 while she was in the hospital. She presents to the clinic accompanied by her husband. She reports her leg swelling has resolved, but she continues to have abdominal swelling. She also reports her bowel movements are formed now. She does, however, report that she still has sores on her bottom.   All other systems were reviewed with the patient and are negative.  MEDICAL HISTORY:  Past Medical History:  Diagnosis Date   Colorectal cancer [facility])    Family history of brain cancer  Family history of prostate cancer    Family history of stomach cancer    Heart murmur    dx at 26   MVP is stable    Hemorrhoids     SURGICAL HISTORY: Past Surgical History:  Procedure Laterality Date   NO PAST SURGERIES      I have reviewed the social history and family history with the patient and they are unchanged from previous note.  ALLERGIES:  is allergic to codeine, epinephrine (anaphylaxis), orange fruit [citrus], streptogramins, and tomato.  MEDICATIONS:  Current Outpatient Medications  Medication Sig Dispense Refill   acetaminophen (TYLENOL) 325 MG tablet Take 650 mg by mouth every 6 (six) hours as needed for moderate pain.     apixaban (ELIQUIS) 5 MG TABS tablet Take 2 tablets (10 mg total) by mouth 2 (two) times daily for 2 days, THEN 1 tablet (5 mg total) 2 (two) times daily for 28 days. 64 tablet 1   capecitabine (XELODA) 500 MG tablet Take 2 tabs in morning and 3 tabs in evening every 12 hours. Take on days 1-14 of chemotherapy. Repeat every 21 days. (Patient taking differently: Take 1,000 mg/m2 by mouth See admin instructions. Take 2 tabs in morning and 3 tabs in evening every 12 hours. Take on days 1-14 of chemotherapy. Repeat every 21 days.) 70 tablet 1   cholecalciferol (VITAMIN D3) 25 MCG (1000 UNIT) tablet Take 1,000 Units by mouth daily.     furosemide (LASIX) 20 MG tablet Take 2 tablets (40 mg total) by mouth daily. 30 tablet 0   loperamide (IMODIUM) 2 MG capsule Take 1 capsule (2 mg total) by mouth as needed for diarrhea or loose stools. 30 capsule 0   Multiple Vitamin (MULTIVITAMIN WITH MINERALS) TABS tablet Take 1 tablet by mouth daily.     ondansetron (ZOFRAN) 8 MG tablet Take 1 tablet (8 mg total) by mouth 2 (two) times daily as needed for refractory nausea / vomiting. Start on day 3 after chemotherapy. 30 tablet 1   pantoprazole (PROTONIX) 40 MG tablet Take 1 tablet (40 mg total) by mouth daily at 6 (six) AM. 30 tablet 1   potassium chloride SA (KLOR-CON) 20 MEQ tablet Take 1 tablet (20 mEq total) by mouth daily. 60 tablet 0   traMADol (ULTRAM) 50 MG tablet Take 0.5-1 tablets  (25-50 mg total) by mouth every 8 (eight) hours as needed. (Patient taking differently: Take 25-50 mg by mouth every 8 (eight) hours as needed for moderate pain.) 30 tablet 0   VALERIAN ROOT PO Take 1 tablet by mouth daily.     vitamin C (ASCORBIC ACID) 500 MG tablet Take 500 mg by mouth daily.     No current facility-administered medications for this visit.    PHYSICAL EXAMINATION: ECOG PERFORMANCE STATUS: 3 - Symptomatic, >50% confined to bed  Vitals:   06/21/21 0823  BP: (!) 106/94  Pulse: 98  Resp: 18  Temp: 98 F (36.7 C)  SpO2: 99%   Wt Readings from Last 3 Encounters:  06/21/21 109 lb (49.4 kg)  05/19/21 109 lb 12.6 oz (49.8 kg)  04/24/21 112 lb 9.6 oz (51.1 kg)     GENERAL:alert, no distress and comfortable SKIN: skin color, texture, turgor are normal, no rashes or significant lesions, (+) superficial sores on buttock EYES: normal, Conjunctiva are pink and non-injected, sclera clear  HEART: no lower extremity edema ABDOMEN:abdomen soft, non-tender and normal bowel sounds, (+) distended and moderate ascites  Musculoskeletal:no cyanosis of digits and no clubbing, (+)  abdominal swelling NEURO: alert & oriented x 3 with fluent speech, no focal motor/sensory deficits  LABORATORY DATA:  I have reviewed the data as listed CBC Latest Ref Rng & Units 06/21/2021 05/26/2021 05/25/2021  WBC 4.0 - 10.5 K/uL 6.6 16.7(H) 20.8(H)  Hemoglobin 12.0 - 15.0 g/dL 10.6(L) 11.3(L) 11.1(L)  Hematocrit 36.0 - 46.0 % 31.9(L) 31.6(L) 31.7(L)  Platelets 150 - 400 K/uL 153 116(L) 88(L)     CMP Latest Ref Rng & Units 06/21/2021 05/27/2021 05/26/2021  Glucose 70 - 99 mg/dL 107(H) 94 106(H)  BUN 6 - 20 mg/dL 5(L) 22(H) 22(H)  Creatinine 0.44 - 1.00 mg/dL 0.55 0.56 0.58  Sodium 135 - 145 mmol/L 137 134(L) 134(L)  Potassium 3.5 - 5.1 mmol/L 3.2(L) 4.4 5.0  Chloride 98 - 111 mmol/L 101 110 110  CO2 22 - 32 mmol/L 26 18(L) 17(L)  Calcium 8.9 - 10.3 mg/dL 8.1(L) 8.4(L) 8.4(L)  Total Protein 6.5 -  8.1 g/dL 6.2(L) - 5.2(L)  Total Bilirubin 0.3 - 1.2 mg/dL 0.8 - 0.8  Alkaline Phos 38 - 126 U/L 159(H) - 100  AST 15 - 41 U/L 38 - 34  ALT 0 - 44 U/L 34 - 30      RADIOGRAPHIC STUDIES: I have personally reviewed the radiological images as listed and agreed with the findings in the report. No results found.    No orders of the defined types were placed in this encounter.  All questions were answered. The patient knows to call the clinic with any problems, questions or concerns. No barriers to learning was detected. The total time spent in the appointment was 30 minutes.     Truitt Merle, MD 06/21/2021   I, Wilburn Mylar, am acting as scribe for Truitt Merle, MD.   I have reviewed the above documentation for accuracy and completeness, and I agree with the above.

## 2021-06-22 ENCOUNTER — Encounter: Payer: Self-pay | Admitting: Hematology

## 2021-06-24 ENCOUNTER — Telehealth: Payer: Self-pay | Admitting: Hematology

## 2021-06-24 NOTE — Telephone Encounter (Signed)
Scheduled follow-up appointment per 11/4 los. Patient is aware.

## 2021-06-27 ENCOUNTER — Ambulatory Visit (HOSPITAL_COMMUNITY)
Admission: RE | Admit: 2021-06-27 | Discharge: 2021-06-27 | Disposition: A | Discharge status: Home / Self Care | Payer: 59 | Source: Ambulatory Visit | Attending: Hematology | Admitting: Hematology

## 2021-06-27 ENCOUNTER — Other Ambulatory Visit: Payer: Self-pay

## 2021-06-27 DIAGNOSIS — R188 Other ascites: Secondary | ICD-10-CM | POA: Insufficient documentation

## 2021-06-27 DIAGNOSIS — C19 Malignant neoplasm of rectosigmoid junction: Secondary | ICD-10-CM | POA: Diagnosis not present

## 2021-06-27 LAB — BODY FLUID CELL COUNT WITH DIFFERENTIAL
Eos, Fluid: 0 %
Lymphs, Fluid: 64 %
Monocyte-Macrophage-Serous Fluid: 35 % — ABNORMAL LOW (ref 50–90)
Neutrophil Count, Fluid: 1 % (ref 0–25)
Total Nucleated Cell Count, Fluid: 256 cu mm (ref 0–1000)

## 2021-06-27 LAB — ALBUMIN, PLEURAL OR PERITONEAL FLUID: Albumin, Fluid: <1.5 g/dL

## 2021-06-27 MED ORDER — LIDOCAINE HCL 1 % IJ SOLN
INTRAMUSCULAR | Status: AC
  Administered 2021-06-27: 10 mL
  Filled 2021-06-27: qty 20

## 2021-06-27 NOTE — Procedures (Signed)
PROCEDURE SUMMARY:  Successful US guided paracentesis from RLQ.  Yielded 1.5 L of clear, yellow fluid.  No immediate complications.  Pt tolerated well.   Specimen was sent for labs.  EBL < 1 mL  Tyson Alias, AGNP 06/27/2021 4:23 PM

## 2021-06-28 ENCOUNTER — Other Ambulatory Visit: Payer: Self-pay | Admitting: Hematology

## 2021-06-28 ENCOUNTER — Encounter: Payer: Self-pay | Admitting: Hematology

## 2021-07-01 ENCOUNTER — Other Ambulatory Visit: Payer: Self-pay

## 2021-07-01 ENCOUNTER — Inpatient Hospital Stay: Payer: 59 | Admitting: Hematology

## 2021-07-01 ENCOUNTER — Inpatient Hospital Stay: Payer: 59

## 2021-07-01 ENCOUNTER — Encounter: Payer: Self-pay | Admitting: Hematology

## 2021-07-01 VITALS — BP 107/77 | HR 83 | Temp 97.8°F | Resp 16 | Ht 64.0 in | Wt 108.7 lb

## 2021-07-01 DIAGNOSIS — C19 Malignant neoplasm of rectosigmoid junction: Secondary | ICD-10-CM

## 2021-07-01 LAB — CBC WITH DIFFERENTIAL (CANCER CENTER ONLY)
Abs Immature Granulocytes: 0.01 10*3/uL (ref 0.00–0.07)
Basophils Absolute: 0 10*3/uL (ref 0.0–0.1)
Basophils Relative: 1 %
Eosinophils Absolute: 0.1 10*3/uL (ref 0.0–0.5)
Eosinophils Relative: 2 %
HCT: 31.6 % — ABNORMAL LOW (ref 36.0–46.0)
Hemoglobin: 10 g/dL — ABNORMAL LOW (ref 12.0–15.0)
Immature Granulocytes: 0 %
Lymphocytes Relative: 22 %
Lymphs Abs: 1.2 10*3/uL (ref 0.7–4.0)
MCH: 30.7 pg (ref 26.0–34.0)
MCHC: 31.6 g/dL (ref 30.0–36.0)
MCV: 96.9 fL (ref 80.0–100.0)
Monocytes Absolute: 0.5 10*3/uL (ref 0.1–1.0)
Monocytes Relative: 8 %
Neutro Abs: 3.7 10*3/uL (ref 1.7–7.7)
Neutrophils Relative %: 67 %
Platelet Count: 134 10*3/uL — ABNORMAL LOW (ref 150–400)
RBC: 3.26 MIL/uL — ABNORMAL LOW (ref 3.87–5.11)
RDW: 16.5 % — ABNORMAL HIGH (ref 11.5–15.5)
WBC Count: 5.5 10*3/uL (ref 4.0–10.5)
nRBC: 0 % (ref 0.0–0.2)

## 2021-07-01 LAB — CMP (CANCER CENTER ONLY)
ALT: 41 U/L (ref 0–44)
AST: 61 U/L — ABNORMAL HIGH (ref 15–41)
Albumin: 2.7 g/dL — ABNORMAL LOW (ref 3.5–5.0)
Alkaline Phosphatase: 158 U/L — ABNORMAL HIGH (ref 38–126)
Anion gap: 7 (ref 5–15)
BUN: 6 mg/dL (ref 6–20)
CO2: 27 mmol/L (ref 22–32)
Calcium: 8.3 mg/dL — ABNORMAL LOW (ref 8.9–10.3)
Chloride: 104 mmol/L (ref 98–111)
Creatinine: 0.59 mg/dL (ref 0.44–1.00)
GFR, Estimated: >60 mL/min (ref >60)
Glucose, Bld: 96 mg/dL (ref 70–99)
Potassium: 3.3 mmol/L — ABNORMAL LOW (ref 3.5–5.1)
Sodium: 138 mmol/L (ref 135–145)
Total Bilirubin: 0.6 mg/dL (ref 0.3–1.2)
Total Protein: 6.2 g/dL — ABNORMAL LOW (ref 6.5–8.1)

## 2021-07-01 LAB — CEA (IN HOUSE-CHCC): CEA (CHCC-In House): 23.62 ng/mL — ABNORMAL HIGH (ref 0.00–5.00)

## 2021-07-01 LAB — CYTOLOGY - NON PAP

## 2021-07-01 NOTE — Progress Notes (Signed)
[facility]   [phone] [phone]   Clinic Follow up Note   Patient Care Team: Truitt Merle, MD as PCP - General (Hematology) Truitt Merle, MD as Consulting Physician (Oncology) Milus Banister, MD as Attending Physician (Gastroenterology) Kyung Rudd, MD as Consulting Physician (Radiation Oncology)  Date of Service:  07/01/2021  CHIEF COMPLAINT: f/u of metastatic colon cancer  CURRENT THERAPY:  CAPOX and bevacizumab, starting 03/13/21  ASSESSMENT & PLAN:  Angela Mcpherson is a 60 y.o. female with   1. Acute thrombosis and new ascites  -admitted 05/18/21 with acute encephalopathy, colitis, abdominal aorta mural thrombosis, and thrombocytopenia. -she is on Eliquis and will continue  -she had paracentesis done on 06/27/21 for abdominal swelling/bloating; cytology is still pending (due to short staffing in pathology). -she has fluid and gas in the abdomen on exam today. I recommend she try Gas-X to relieve some of the buildup.   2. 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 She developed hematochezia and went to urgent care 05/2020. She met Dr. Ardis Hughs and underwent colonoscopy 02/04/21 showing rectosigmoid mass, with path confirming adenocarcinoma. -staging CT CAP 02/08/21 showed metastasis to lymph nodes and liver. PET on 03/04/21 confirmed hypermetabolism to rectal mass with extension into left perirectal fat, operator and periportal nodes, and two liver lesions. -FO showed MSS, low tumor mutation burden, and KRAS G12D mutation (+); baseline CEA 1,177.27 -she began Capox and bevacizumab on 03/13/21, is tolerating chemo well  -slightly worse cold sensitivity after cycle 2, recovered well  -restaging CT AP 05/18/21 showed decrease in size of rectosigmoid mass and left pelvic lymph node. -she was clinically doing well, tumor marker  CEA has dropped significantly since she started chemo, indicating good response to treatment, most recently 12.34 on 06/21/21.  Her rectal symptom also improved. -she was recenetly hospitalized and chemo was held -She has recovered moderately well from her recent hospital stay, I recommended her to restart capecitabine at a reduced dose and frequency, 1000 mg every 12 hours, 7 days on and 7 days off.  We will hold on oxaliplatin and bevacizumab for now.  She agrees. Will start tomorrow    3. Goal of care discussion -We previously discussed the incurable nature of her cancer, and the overall poor prognosis, especially if she does not have good response to chemotherapy or progress on chemo -The patient understands the goal of care is palliative. -she previously agreed with DNR, but changed to full code later on.      Plan -will restart Xeloda 1051m q12h day 1-7 every 14 days tomorrow, continue to hold on oxaliplatin and bevacizumab for now -lab and f/u in 2 weeks   No problem-specific Assessment & Plan notes found for this encounter.   SUMMARY OF ONCOLOGIC HISTORY: Oncology History Overview Note  Cancer Staging Malignant neoplasm of rectosigmoid junction ([facility] Staging form: Colon and Rectum, AJCC 8th Edition - Clinical stage from 02/04/2021: Stage IVB (cTX, cN2, cM1b) - Signed by FTruitt Merle MD on 02/12/2021 Stage prefix: Initial diagnosis    Malignant neoplasm of rectosigmoid junction ([facility]  02/04/2021 Procedure   Colonoscopy by Dr JArdis Hughs6/20/22 IMPRESSION - Two 2 to 6 mm polyps in the sigmoid colon and in the cecum, removed with a cold snare. Resected and retrieved. - A fungating, infiltrative and ulcerated partially obstructing mass was found in the rectosigmoid colon (distal edge about 7cm from the anus). The  mass was partially circumferential (involving one-half of the lumen circumference). The mass measured eight cm in length. This was biopsied with a cold forceps for histology.  The distal edge of the mass was tattooed with two submucosal injections of Spot (carbon black). Sigmoid colon felt somewhat fixed in place (large CRC or perhaps invasion into colon from other primary site?) - Internal hemorrhoids. - The examination was otherwise normal on direct and retroflexion views.   02/04/2021 Pathology Results   Diagnosis 1. 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 - NO HIGH-GRADE DYSPLASIA OR CARCINOMA. 2. Colon, biopsy, rectosigmoid - INVASIVE ADENOCARCINOMA. - SEE NOTE.   02/04/2021 Cancer Staging   Staging form: Colon and Rectum, AJCC 8th Edition - Clinical stage from 02/04/2021: Stage IVB (cTX, cN2, cM1b) - Signed by Truitt Merle, MD on 02/12/2021 Stage prefix: Initial diagnosis    02/08/2021 Imaging   CT CHEST, ABDOMEN AND PELVIS W CONTRAST  IMPRESSION: 1. There is a large, concentric mass of the rectosigmoid junction measuring 7.8 x 5.9 x 5.2 cm. Findings are consistent with primary colon malignancy identified by colonoscopy. 2. Multiple enlarged perirectal, bilateral pelvic sidewall, and retroperitoneal lymph nodes, consistent with nodal metastatic disease. 3. There are multiple small low-attenuation lesions of the liver parenchyma, incompletely characterized although suspicious for hepatic metastatic disease. PET-CT or multiphasic contrast enhanced MRI could be used to more clearly characterize. 4. There are multiple small bilateral pulmonary nodules, measuring 3 mm and smaller, nonspecific although highly suspicious for early pulmonary metastatic disease given overall constellation of findings.   02/12/2021 Initial Diagnosis   Malignant neoplasm of rectosigmoid junction ([place])   03/04/2021 PET scan   IMPRESSION: 1. Intense hypermetabolic activity associated with rectal mass consistent with primary colorectal carcinoma. 2. Local extension of primary tumor into the LEFT perirectal fat. 3. Hypermetabolic LEFT operator node  consistent with local nodal metastasis. 4. Hypermetabolic metastasis to the LEFT and RIGHT hepatic lobe (two lesions). 5. Hypermetabolic periportal lymph node. 6. Small pulmonary nodules without radiotracer activity. Recommend attention on follow-up.   03/13/2021 -  Chemotherapy    Patient is on Treatment Plan: COLORECTAL XELOX (CAPEOX) Q21D        Genetic Testing   Negative genetic testing. No pathogenic variants identified on the [facility] Multi-Cancer+RNA Panel. 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 The report date is 04/01/2021.  The Multi-Cancer Panel + RNA offered by [facility] includes sequencing and/or deletion duplication testing of the following 84 genes: AIP, ALK, APC, ATM, AXIN2,BAP1,  BARD1, BLM, BMPR1A, BRCA1, BRCA2, BRIP1, CASR, CDC73, CDH1, CDK4, CDKN1B, CDKN1C, CDKN2A (p14ARF), CDKN2A (p16INK4a), CEBPA, CHEK2, CTNNA1, DICER1, DIS3L2, EGFR (c.2369C>T, p.Thr790Met variant only), EPCAM (Deletion/duplication testing only), FH, FLCN, GATA2, GPC3, GREM1 (Promoter region deletion/duplication testing only), HOXB13 (c.251G>A, p.Gly84Glu), HRAS, KIT, MAX, MEN1, MET, MITF (c.952G>A, p.Glu318Lys variant only), MLH1, MSH2, MSH3, MSH6, MUTYH, NBN, NF1, NF2, NTHL1, PALB2, PDGFRA, PHOX2B, PMS2, POLD1, POLE, POT1, PRKAR1A, PTCH1, PTEN, RAD50, RAD51C, RAD51D, RB1, RECQL4, RET, RUNX1, SDHAF2, SDHA (sequence changes only), SDHB, SDHC, SDHD, SMAD4, SMARCA4, SMARCB1, SMARCE1, STK11, SUFU, TERC, TERT, TMEM127, TP53, TSC1, TSC2, VHL, WRN and WT1.   05/18/2021 Imaging   CT AP  IMPRESSION: 1. 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 2. Wall thickening of the ascending colon, cecum and ileum worrisome for enterocolitis. Findings may be infectious, inflammatory or ischemic given findings in the aorta. No pneumatosis or free air. 3. Small amount of ascites. 4. Rectosigmoid mass has decreased in size. Left pelvic  lymph node has decreased in size. 5.  Previously identified liver lesions not well characterized on this study secondary to motion artifact.      INTERVAL HISTORY:  Angela Mcpherson is here for a follow up of metastatic colon cancer. She was last seen by me on 06/21/21. She presents to the clinic accompanied by her husband. She reports she is improving. She notes she is exercising. She reports she still has abdominal bloating.   All other systems were reviewed with the patient and are negative.  MEDICAL HISTORY:  Past Medical History:  Diagnosis Date   Colorectal cancer ([place])    Family history of brain cancer    Family history of prostate cancer    Family history of stomach cancer    Heart murmur    dx at 26   MVP is stable   Hemorrhoids     SURGICAL HISTORY: Past Surgical History:  Procedure Laterality Date   NO PAST SURGERIES      I have reviewed the social history and family history with the patient and they are unchanged from previous note.  ALLERGIES:  is allergic to codeine, epinephrine (anaphylaxis), orange fruit [citrus], streptogramins, and tomato.  MEDICATIONS:  Current Outpatient Medications  Medication Sig Dispense Refill   acetaminophen (TYLENOL) 325 MG tablet Take 650 mg by mouth every 6 (six) hours as needed for moderate pain.     apixaban (ELIQUIS) 5 MG TABS tablet Take 2 tablets (10 mg total) by mouth 2 (two) times daily for 2 days, THEN 1 tablet (5 mg total) 2 (two) times daily for 28 days. 64 tablet 1   cholecalciferol (VITAMIN D3) 25 MCG (1000 UNIT) tablet Take 1,000 Units by mouth daily.     loperamide (IMODIUM) 2 MG capsule Take 1 capsule (2 mg total) by mouth as needed for diarrhea or loose stools. 30 capsule 0   ondansetron (ZOFRAN) 8 MG tablet Take 1 tablet (8 mg total) by mouth 2 (two) times daily as needed for refractory nausea / vomiting. Start on day 3 after chemotherapy. 30 tablet 1   pantoprazole (PROTONIX) 40 MG tablet Take 1 tablet (40 mg total) by mouth daily at 6 (six) AM. 30  tablet 1   traMADol (ULTRAM) 50 MG tablet Take 0.5-1 tablets (25-50 mg total) by mouth every 8 (eight) hours as needed. (Patient taking differently: Take 25-50 mg by mouth every 8 (eight) hours as needed for moderate pain.) 30 tablet 0   VALERIAN ROOT PO Take 1 tablet by mouth daily.     No current facility-administered medications for this visit.    PHYSICAL EXAMINATION: ECOG PERFORMANCE STATUS: 2 - Symptomatic, <50% confined to bed  Vitals:   07/01/21 1502  BP: 107/77  Pulse: 83  Resp: 16  Temp: 97.8 F (36.6 C)  SpO2: 100%   Wt Readings from Last 3 Encounters:  07/01/21 108 lb 11.2 oz (49.3 kg)  06/21/21 109 lb (49.4 kg)  05/19/21 109 lb 12.6 oz (49.8 kg)     GENERAL:alert, no distress and comfortable SKIN: skin color, texture, turgor are normal, no rashes or significant lesions EYES: normal, Conjunctiva are pink and non-injected, sclera clear ABDOMEN:abdomen soft, non-tender, (+) fluid and gas present NEURO: alert & oriented x 3 with fluent speech, no focal motor/sensory deficits  LABORATORY DATA:  I have reviewed the data as listed CBC Latest Ref Rng & Units 07/01/2021 06/21/2021 05/26/2021  WBC 4.0 - 10.5 K/uL 5.5 6.6 16.7(H)  Hemoglobin 12.0 - 15.0 g/dL 10.0(L) 10.6(L) 11.3(L)  Hematocrit 36.0 - 46.0 % 31.6(L) 31.9(L) 31.6(L)  Platelets 150 - 400 K/uL 134(L) 153 116(L)     CMP Latest Ref Rng & Units 07/01/2021 06/21/2021 05/27/2021  Glucose 70 - 99 mg/dL 96 107(H) 94  BUN 6 - 20 mg/dL 6 5(L) 22(H)  Creatinine 0.44 - 1.00 mg/dL 0.59 0.55 0.56  Sodium 135 - 145 mmol/L 138 137 134(L)  Potassium 3.5 - 5.1 mmol/L 3.3(L) 3.2(L) 4.4  Chloride 98 - 111 mmol/L 104 101 110  CO2 22 - 32 mmol/L 27 26 18(L)  Calcium 8.9 - 10.3 mg/dL 8.3(L) 8.1(L) 8.4(L)  Total Protein 6.5 - 8.1 g/dL 6.2(L) 6.2(L) -  Total Bilirubin 0.3 - 1.2 mg/dL 0.6 0.8 -  Alkaline Phos 38 - 126 U/L 158(H) 159(H) -  AST 15 - 41 U/L 61(H) 38 -  ALT 0 - 44 U/L 41 34 -      RADIOGRAPHIC STUDIES: I  have personally reviewed the radiological images as listed and agreed with the findings in the report. No results found.    No orders of the defined types were placed in this encounter.  All questions were answered. The patient knows to call the clinic with any problems, questions or concerns. No barriers to learning was detected. The total time spent in the appointment was 30 minutes.     Truitt Merle, MD 07/01/2021   I, Wilburn Mylar, am acting as scribe for Truitt Merle, MD.   I have reviewed the above documentation for accuracy and completeness, and I agree with the above.

## 2021-07-02 ENCOUNTER — Encounter: Payer: Self-pay | Admitting: Licensed Clinical Social Worker

## 2021-07-02 NOTE — Progress Notes (Signed)
UPDATE: VUS in RECQL4 called c.3120G>A has been reclassified to "Likely Benign." The report date is 07/02/2021.

## 2021-07-03 ENCOUNTER — Telehealth: Payer: Self-pay

## 2021-07-03 NOTE — Telephone Encounter (Signed)
Spoke with pt's husband Angela Mcpherson in regards to pt Protonix.  Dr. Burr Medico wants the pt to stop taking the Protonix for 3 days (72hrs) and start taking Famotidine (Pepcid) OTC d/t the Protonix is contraindicated to Xeloda.  Pt's husband verbalized understanding of instructions.

## 2021-07-04 ENCOUNTER — Telehealth (HOSPITAL_COMMUNITY): Payer: Self-pay | Admitting: Dietician

## 2021-07-04 ENCOUNTER — Encounter (HOSPITAL_COMMUNITY): Payer: 59 | Admitting: Dietician

## 2021-07-04 ENCOUNTER — Encounter: Payer: Self-pay | Admitting: Hematology

## 2021-07-04 NOTE — Telephone Encounter (Signed)
Nutrition Follow-up:  Patient receiving Capeox for metastatic colon cancer. Oxaliplatin and bevacizumab currently on hold. Patient currently receiving reduce dose/frequency Xeloda.  -Recent hospital admission 10/1-10/10 with sepsis, acute colitis d/t norovirus gastroenteritis  Spoke with patient via telephone. She reports having a great appetite, tolerating regular diet and eating "whatever she wants." Patient continues drinking one Boost or Ensure everyday. She had a big bowl of crunch berries before taking Xeloda this morning. Reports feeling sleepy after oral chemo, she is just waking up and plans to have spaghetti for lunch. Patient is taking Miralax for constipation, reports having a bowel movement this morning. Patient started taking Gas-x for abdominal bloating and gas, reports drinking milk and ginger ale work well to alleviate gas. She is going to weekly outpatient PT/OT, reports enjoys her sessions and is getting stronger.   Medications: reviewed   Labs: 11/14 - K 3.3  Anthropometrics: Last weight 108 lb 11.2 oz on 11/14 stable. Patient weighed 109 lb on 11/4 and 109 lb 12.6 oz on 10/2. (Noted paracentesis on 11/10 yielding 1.5 L) Weights have decreased ~4 lbs (3.6%) from 112 lb 9.6 oz on 9/7. This is insignificant for time frame.    NUTRITION DIAGNOSIS: Unintentional weight loss stable   MALNUTRITION DIAGNOSIS: Noted patient diagnosed with severe malnutrition on 10/3 by inpatient RD - suspect this is ongoing   INTERVENTION:  Continue strategies for increasing calories and protein with small frequent meals and snacks Continue drinking Ensure Plus/equivalent daily for added calories and protein Take Miralax and Gas-x as prescribed per MD Encouraged activity as able Patient has contact information     MONITORING, EVALUATION, GOAL: weight trends, intake    NEXT VISIT: To be scheduled as needed

## 2021-07-04 NOTE — Telephone Encounter (Signed)
Recalled pt's husband to remind him she is to start taking Famotidine OTC and stop taking the Protonix d/t it's contraindicated to Xeloda.  Pt stated that she had stopped taking Protonix 2 days prior to this nurses' call to pt because her pharmacist had informed she and her husband of the protonix interaction.

## 2021-07-15 ENCOUNTER — Inpatient Hospital Stay: Payer: 59 | Admitting: Hematology

## 2021-07-15 ENCOUNTER — Encounter: Payer: Self-pay | Admitting: Hematology

## 2021-07-15 ENCOUNTER — Other Ambulatory Visit: Payer: Self-pay

## 2021-07-15 ENCOUNTER — Inpatient Hospital Stay: Payer: 59

## 2021-07-15 VITALS — BP 110/75 | HR 82 | Temp 97.6°F | Resp 17 | Ht 64.0 in | Wt 107.7 lb

## 2021-07-15 DIAGNOSIS — C19 Malignant neoplasm of rectosigmoid junction: Secondary | ICD-10-CM

## 2021-07-15 LAB — CBC WITH DIFFERENTIAL (CANCER CENTER ONLY)
Abs Immature Granulocytes: 0.01 10*3/uL (ref 0.00–0.07)
Basophils Absolute: 0 10*3/uL (ref 0.0–0.1)
Basophils Relative: 1 %
Eosinophils Absolute: 0.2 10*3/uL (ref 0.0–0.5)
Eosinophils Relative: 3 %
HCT: 33.3 % — ABNORMAL LOW (ref 36.0–46.0)
Hemoglobin: 10.6 g/dL — ABNORMAL LOW (ref 12.0–15.0)
Immature Granulocytes: 0 %
Lymphocytes Relative: 27 %
Lymphs Abs: 1.4 10*3/uL (ref 0.7–4.0)
MCH: 30.4 pg (ref 26.0–34.0)
MCHC: 31.8 g/dL (ref 30.0–36.0)
MCV: 95.4 fL (ref 80.0–100.0)
Monocytes Absolute: 0.5 10*3/uL (ref 0.1–1.0)
Monocytes Relative: 9 %
Neutro Abs: 3.1 10*3/uL (ref 1.7–7.7)
Neutrophils Relative %: 60 %
Platelet Count: 134 10*3/uL — ABNORMAL LOW (ref 150–400)
RBC: 3.49 MIL/uL — ABNORMAL LOW (ref 3.87–5.11)
RDW: 17.9 % — ABNORMAL HIGH (ref 11.5–15.5)
WBC Count: 5.1 10*3/uL (ref 4.0–10.5)
nRBC: 0 % (ref 0.0–0.2)

## 2021-07-15 LAB — CMP (CANCER CENTER ONLY)
ALT: 45 U/L — ABNORMAL HIGH (ref 0–44)
AST: 50 U/L — ABNORMAL HIGH (ref 15–41)
Albumin: 3.1 g/dL — ABNORMAL LOW (ref 3.5–5.0)
Alkaline Phosphatase: 140 U/L — ABNORMAL HIGH (ref 38–126)
Anion gap: 8 (ref 5–15)
BUN: 8 mg/dL (ref 6–20)
CO2: 25 mmol/L (ref 22–32)
Calcium: 8.9 mg/dL (ref 8.9–10.3)
Chloride: 106 mmol/L (ref 98–111)
Creatinine: 0.68 mg/dL (ref 0.44–1.00)
GFR, Estimated: >60 mL/min (ref >60)
Glucose, Bld: 88 mg/dL (ref 70–99)
Potassium: 4.1 mmol/L (ref 3.5–5.1)
Sodium: 139 mmol/L (ref 135–145)
Total Bilirubin: 0.6 mg/dL (ref 0.3–1.2)
Total Protein: 6.8 g/dL (ref 6.5–8.1)

## 2021-07-15 LAB — TOTAL PROTEIN, URINE DIPSTICK: Protein, ur: NEGATIVE mg/dL

## 2021-07-15 MED ORDER — CAPECITABINE 500 MG PO TABS: ORAL_TABLET | ORAL | 0 refills | Status: DC

## 2021-07-15 NOTE — Progress Notes (Signed)
[place]   [phone] [phone]   Clinic Follow up Note   Patient Care Team: Truitt Merle, MD as PCP - General (Hematology) Truitt Merle, MD as Consulting Physician (Oncology) Milus Banister, MD as Attending Physician (Gastroenterology) Kyung Rudd, MD as Consulting Physician (Radiation Oncology)  Date of Service:  07/15/2021  CHIEF COMPLAINT: f/u of metastatic colon cancer  CURRENT THERAPY:  CAPOX and bevacizumab, starting 03/13/21  ASSESSMENT & PLAN:  Angela Mcpherson is a 60 y.o. female with   1. Acute thrombosis and new ascites  -admitted 05/18/21 with acute encephalopathy, colitis, abdominal aorta mural thrombosis, and thrombocytopenia. -she is on Eliquis and will continue  -she had paracentesis done on 06/27/21 for abdominal swelling/bloating; cytology was negative -she reports stable bloating today. Exam today shows mostly gas in the lower abdomen, with small to moderate ascites. She endorses taking Gas-X and Pepcid. -we will continue to monitor. I advised her to contact us if the bloating increases and I will order paracentesis    2. 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 She developed hematochezia and went to urgent care 05/2020. She met Dr. Ardis Hughs and underwent colonoscopy 02/04/21 showing rectosigmoid mass, with path confirming adenocarcinoma. -staging CT CAP 02/08/21 showed metastasis to lymph nodes and liver. PET on 03/04/21 confirmed hypermetabolism to rectal mass with extension into left perirectal fat, operator and periportal nodes, and two liver lesions. -FO showed MSS, low tumor mutation burden, and KRAS G12D mutation (+); baseline CEA 1,177.27 -she began Capox and bevacizumab on 03/13/21, is tolerating chemo well  -slightly worse cold sensitivity after cycle 2, recovered well  -restaging CT AP 05/18/21  showed decrease in size of rectosigmoid mass and left pelvic lymph node. -tumor marker CEA dropped significantly after starting chemo, indicating good response to treatment.  However, most recent CEA 11/14 showed slight rise to 23.62. -she was recently hospitalized and chemo has been held. She restarted Xeloda at 1000 mg BID 7 days on/7 days off on 11/15. She tolerated very well. We will increase to 1000 mg in AM and 1500 mg in PM 14 days on/7 days off this next cycle. We will continue to hold oxaliplatin and bevacizumab for now.    3. Goal of care discussion -We previously discussed the incurable nature of her cancer, and the overall poor prognosis, especially if she does not have good response to chemotherapy or progress on chemo -The patient understands the goal of care is palliative. -she previously agreed with DNR, but changed to full code later on.      Plan -will start cycle 2 Xeloda 1041m AM, 15070mPM, day 1-14 every 21 days tomorrow, continue to hold on oxaliplatin and bevacizumab for now -lab and f/u in 3 weeks   No problem-specific Assessment & Plan notes found for this encounter.   SUMMARY OF ONCOLOGIC HISTORY: Oncology History Overview Note  Cancer Staging Malignant ne[facility]osigmoid junction [facility] form: Colon and Rectum, AJCC 8th Edition - Clinical stage from 02/04/2021: Stage IVB (cTX, cN2, cM1b) - Signed by FeTruitt MerleMD on 02/12/2021 Stage prefix: Initial diagnosis    Malignant neoplasm of rectosigmoid junction ([facility] 02/04/2021 Procedure   Colonoscopy by Dr JaArdis Hughs/20/22 IMPRESSION - Two 2 to 6 mm polyps in the sigmoid colon and in the cecum, removed with a cold snare. Resected and retrieved. - A fungating, infiltrative and ulcerated partially obstructing mass  was found in the rectosigmoid colon (distal edge about 7cm from the anus). The mass was partially circumferential (involving one-half of the lumen circumference). The mass measured eight cm in  length. This was biopsied with a cold forceps for histology. The distal edge of the mass was tattooed with two submucosal injections of Spot (carbon black). Sigmoid colon felt somewhat fixed in place (large CRC or perhaps invasion into colon from other primary site?) - Internal hemorrhoids. - The examination was otherwise normal on direct and retroflexion views.   02/04/2021 Pathology Results   Diagnosis 1. 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 - NO HIGH-GRADE DYSPLASIA OR CARCINOMA. 2. Colon, biopsy, rectosigmoid - INVASIVE ADENOCARCINOMA. - SEE NOTE.   02/04/2021 Cancer Staging   Staging form: Colon and Rectum, AJCC 8th Edition - Clinical stage from 02/04/2021: Stage IVB (cTX, cN2, cM1b) - Signed by Truitt Merle, MD on 02/12/2021 Stage prefix: Initial diagnosis    02/08/2021 Imaging   CT CHEST, ABDOMEN AND PELVIS W CONTRAST  IMPRESSION: 1. There is a large, concentric mass of the rectosigmoid junction measuring 7.8 x 5.9 x 5.2 cm. Findings are consistent with primary colon malignancy identified by colonoscopy. 2. Multiple enlarged perirectal, bilateral pelvic sidewall, and retroperitoneal lymph nodes, consistent with nodal metastatic disease. 3. There are multiple small low-attenuation lesions of the liver parenchyma, incompletely characterized although suspicious for hepatic metastatic disease. PET-CT or multiphasic contrast enhanced MRI could be used to more clearly characterize. 4. There are multiple small bilateral pulmonary nodules, measuring 3 mm and smaller, nonspecific although highly suspicious for early pulmonary metastatic disease given overall constellation of findings.   02/12/2021 Initial Diagnosis   Malignant neoplasm of rectosigmoid junction ([place])   03/04/2021 PET scan   IMPRESSION: 1. Intense hypermetabolic activity associated with rectal mass consistent with primary colorectal carcinoma. 2. Local extension of primary tumor into the LEFT  perirectal fat. 3. Hypermetabolic LEFT operator node consistent with local nodal metastasis. 4. Hypermetabolic metastasis to the LEFT and RIGHT hepatic lobe (two lesions). 5. Hypermetabolic periportal lymph node. 6. Small pulmonary nodules without radiotracer activity. Recommend attention on follow-up.   03/13/2021 -  Chemotherapy    Patient is on Treatment Plan: COLORECTAL XELOX (CAPEOX) Q21D        Genetic Testing   Negative genetic testing. No pathogenic variants identified on the [facility] Multi-Cancer+RNA Panel. 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 The report date is 04/01/2021.  The Multi-Cancer Panel + RNA offered by [facility] includes sequencing and/or deletion duplication testing of the following 84 genes: AIP, ALK, APC, ATM, AXIN2,BAP1,  BARD1, BLM, BMPR1A, BRCA1, BRCA2, BRIP1, CASR, CDC73, CDH1, CDK4, CDKN1B, CDKN1C, CDKN2A (p14ARF), CDKN2A (p16INK4a), CEBPA, CHEK2, CTNNA1, DICER1, DIS3L2, EGFR (c.2369C>T, p.Thr790Met variant only), EPCAM (Deletion/duplication testing only), FH, FLCN, GATA2, GPC3, GREM1 (Promoter region deletion/duplication testing only), HOXB13 (c.251G>A, p.Gly84Glu), HRAS, KIT, MAX, MEN1, MET, MITF (c.952G>A, p.Glu318Lys variant only), MLH1, MSH2, MSH3, MSH6, MUTYH, NBN, NF1, NF2, NTHL1, PALB2, PDGFRA, PHOX2B, PMS2, POLD1, POLE, POT1, PRKAR1A, PTCH1, PTEN, RAD50, RAD51C, RAD51D, RB1, RECQL4, RET, RUNX1, SDHAF2, SDHA (sequence changes only), SDHB, SDHC, SDHD, SMAD4, SMARCA4, SMARCB1, SMARCE1, STK11, SUFU, TERC, TERT, TMEM127, TP53, TSC1, TSC2, VHL, WRN and WT1.   05/18/2021 Imaging   CT AP  IMPRESSION: 1. 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 2. Wall thickening of the ascending colon, cecum and ileum worrisome for enterocolitis. Findings may be infectious, inflammatory or ischemic given findings in the aorta. No pneumatosis or free air.  3. Small amount of ascites. 4. Rectosigmoid mass has decreased in size.  Left pelvic lymph node has decreased in size. 5. Previously identified liver lesions not well characterized on this study secondary to motion artifact.      INTERVAL HISTORY:  Angela Mcpherson is here for a follow up of metastatic colon cancer. She was last seen by me on 07/01/21. She presents to the clinic accompanied by her husband. She reports she continues to have abdominal bloating, which she describes as about the same as her last visit. She also reports continued lethargy, but she notes she is continuing to do more little by little. For example, she is able to pull herself up from sitting on the toilet now. She expressed concern that she is still not gaining weight despite having a good appetite and eating well.   All other systems were reviewed with the patient and are negative.  MEDICAL HISTORY:  Past Medical History:  Diagnosis Date   Colorectal cancer ([place])    Family history of brain cancer    Family history of prostate cancer    Family history of stomach cancer    Heart murmur    dx at 26   MVP is stable   Hemorrhoids     SURGICAL HISTORY: Past Surgical History:  Procedure Laterality Date   NO PAST SURGERIES      I have reviewed the social history and family history with the patient and they are unchanged from previous note.  ALLERGIES:  is allergic to codeine, epinephrine (anaphylaxis), orange fruit [citrus], streptogramins, and tomato.  MEDICATIONS:  Current Outpatient Medications  Medication Sig Dispense Refill   capecitabine (XELODA) 500 MG tablet 2 tabs in morning and 3 tabs in evening every 12 hours, for 14 days then off 7 days. Patient needs 28 tabs to complete this cycle. Starting next cycle, take 3 tabs every 12 hours for 14 days then off 7 days 112 tablet 0   acetaminophen (TYLENOL) 325 MG tablet Take 650 mg by mouth every 6 (six) hours as needed for moderate pain.     apixaban (ELIQUIS) 5 MG TABS tablet Take 2 tablets (10 mg total) by mouth 2 (two) times  daily for 2 days, THEN 1 tablet (5 mg total) 2 (two) times daily for 28 days. 64 tablet 1   cholecalciferol (VITAMIN D3) 25 MCG (1000 UNIT) tablet Take 1,000 Units by mouth daily.     loperamide (IMODIUM) 2 MG capsule Take 1 capsule (2 mg total) by mouth as needed for diarrhea or loose stools. 30 capsule 0   ondansetron (ZOFRAN) 8 MG tablet Take 1 tablet (8 mg total) by mouth 2 (two) times daily as needed for refractory nausea / vomiting. Start on day 3 after chemotherapy. 30 tablet 1   traMADol (ULTRAM) 50 MG tablet Take 0.5-1 tablets (25-50 mg total) by mouth every 8 (eight) hours as needed. (Patient taking differently: Take 25-50 mg by mouth every 8 (eight) hours as needed for moderate pain.) 30 tablet 0   VALERIAN ROOT PO Take 1 tablet by mouth daily.     No current facility-administered medications for this visit.    PHYSICAL EXAMINATION: ECOG PERFORMANCE STATUS: 2 - Symptomatic, <50% confined to bed  Vitals:   07/15/21 0927  BP: 110/75  Pulse: 82  Resp: 17  Temp: 97.6 F (36.4 C)  SpO2: 100%   Wt Readings from Last 3 Encounters:  07/15/21 107 lb 11.2 oz (48.9 kg)  07/01/21 108 lb 11.2 oz (49.3  kg)  06/21/21 109 lb (49.4 kg)     GENERAL:alert, no distress and comfortable SKIN: skin color, texture, turgor are normal, no rashes or significant lesions EYES: normal, Conjunctiva are pink and non-injected, sclera clear  NECK: supple, thyroid normal size, non-tender, without nodularity LYMPH:  no palpable lymphadenopathy in the cervical, axillary  LUNGS: clear to auscultation and percussion with normal breathing effort HEART: regular rate & rhythm and no murmurs and no lower extremity edema ABDOMEN:abdomen soft, non-tender and normal bowel sounds, (+) fluid and gas present Musculoskeletal:no cyanosis of digits and no clubbing  NEURO: alert & oriented x 3 with fluent speech, no focal motor/sensory deficits  LABORATORY DATA:  I have reviewed the data as listed CBC Latest Ref Rng  & Units 07/15/2021 07/01/2021 06/21/2021  WBC 4.0 - 10.5 K/uL 5.1 5.5 6.6  Hemoglobin 12.0 - 15.0 g/dL 10.6(L) 10.0(L) 10.6(L)  Hematocrit 36.0 - 46.0 % 33.3(L) 31.6(L) 31.9(L)  Platelets 150 - 400 K/uL 134(L) 134(L) 153     CMP Latest Ref Rng & Units 07/15/2021 07/01/2021 06/21/2021  Glucose 70 - 99 mg/dL 88 96 107(H)  BUN 6 - 20 mg/dL 8 6 5(L)  Creatinine 0.44 - 1.00 mg/dL 0.68 0.59 0.55  Sodium 135 - 145 mmol/L 139 138 137  Potassium 3.5 - 5.1 mmol/L 4.1 3.3(L) 3.2(L)  Chloride 98 - 111 mmol/L 106 104 101  CO2 22 - 32 mmol/L 25 27 26   Calcium 8.9 - 10.3 mg/dL 8.9 8.3(L) 8.1(L)  Total Protein 6.5 - 8.1 g/dL 6.8 6.2(L) 6.2(L)  Total Bilirubin 0.3 - 1.2 mg/dL 0.6 0.6 0.8  Alkaline Phos 38 - 126 U/L 140(H) 158(H) 159(H)  AST 15 - 41 U/L 50(H) 61(H) 38  ALT 0 - 44 U/L 45(H) 41 34      RADIOGRAPHIC STUDIES: I have personally reviewed the radiological images as listed and agreed with the findings in the report. No results found.    No orders of the defined types were placed in this encounter.  All questions were answered. The patient knows to call the clinic with any problems, questions or concerns. No barriers to learning was detected. The total time spent in the appointment was 30 minutes.     Truitt Merle, MD 07/15/2021   I, Wilburn Mylar, am acting as scribe for Truitt Merle, MD.   I have reviewed the above documentation for accuracy and completeness, and I agree with the above.

## 2021-08-05 ENCOUNTER — Encounter: Payer: Self-pay | Admitting: Hematology

## 2021-08-05 ENCOUNTER — Inpatient Hospital Stay: Payer: 59

## 2021-08-05 ENCOUNTER — Other Ambulatory Visit: Payer: Self-pay

## 2021-08-05 ENCOUNTER — Inpatient Hospital Stay: Payer: 59 | Attending: Hematology | Admitting: Hematology

## 2021-08-05 VITALS — BP 108/78 | HR 85 | Temp 97.9°F | Resp 18 | Ht 64.0 in | Wt 110.7 lb

## 2021-08-05 DIAGNOSIS — C787 Secondary malignant neoplasm of liver and intrahepatic bile duct: Secondary | ICD-10-CM | POA: Insufficient documentation

## 2021-08-05 DIAGNOSIS — D696 Thrombocytopenia, unspecified: Secondary | ICD-10-CM | POA: Insufficient documentation

## 2021-08-05 DIAGNOSIS — R188 Other ascites: Secondary | ICD-10-CM | POA: Insufficient documentation

## 2021-08-05 DIAGNOSIS — Z7901 Long term (current) use of anticoagulants: Secondary | ICD-10-CM | POA: Insufficient documentation

## 2021-08-05 DIAGNOSIS — C19 Malignant neoplasm of rectosigmoid junction: Secondary | ICD-10-CM | POA: Insufficient documentation

## 2021-08-05 DIAGNOSIS — C7800 Secondary malignant neoplasm of unspecified lung: Secondary | ICD-10-CM | POA: Insufficient documentation

## 2021-08-05 LAB — CBC WITH DIFFERENTIAL (CANCER CENTER ONLY)
Abs Immature Granulocytes: 0.01 10*3/uL (ref 0.00–0.07)
Basophils Absolute: 0 10*3/uL (ref 0.0–0.1)
Basophils Relative: 1 %
Eosinophils Absolute: 0.1 10*3/uL (ref 0.0–0.5)
Eosinophils Relative: 2 %
HCT: 29.7 % — ABNORMAL LOW (ref 36.0–46.0)
Hemoglobin: 9.6 g/dL — ABNORMAL LOW (ref 12.0–15.0)
Immature Granulocytes: 0 %
Lymphocytes Relative: 29 %
Lymphs Abs: 1.3 10*3/uL (ref 0.7–4.0)
MCH: 30.7 pg (ref 26.0–34.0)
MCHC: 32.3 g/dL (ref 30.0–36.0)
MCV: 94.9 fL (ref 80.0–100.0)
Monocytes Absolute: 0.6 10*3/uL (ref 0.1–1.0)
Monocytes Relative: 14 %
Neutro Abs: 2.4 10*3/uL (ref 1.7–7.7)
Neutrophils Relative %: 54 %
Platelet Count: 109 10*3/uL — ABNORMAL LOW (ref 150–400)
RBC: 3.13 MIL/uL — ABNORMAL LOW (ref 3.87–5.11)
RDW: 20.8 % — ABNORMAL HIGH (ref 11.5–15.5)
WBC Count: 4.3 10*3/uL (ref 4.0–10.5)
nRBC: 0 % (ref 0.0–0.2)

## 2021-08-05 LAB — CMP (CANCER CENTER ONLY)
ALT: 29 U/L (ref 0–44)
AST: 40 U/L (ref 15–41)
Albumin: 3.4 g/dL — ABNORMAL LOW (ref 3.5–5.0)
Alkaline Phosphatase: 113 U/L (ref 38–126)
Anion gap: 7 (ref 5–15)
BUN: 10 mg/dL (ref 6–20)
CO2: 26 mmol/L (ref 22–32)
Calcium: 8.9 mg/dL (ref 8.9–10.3)
Chloride: 106 mmol/L (ref 98–111)
Creatinine: 0.7 mg/dL (ref 0.44–1.00)
GFR, Estimated: >60 mL/min (ref >60)
Glucose, Bld: 98 mg/dL (ref 70–99)
Potassium: 3.8 mmol/L (ref 3.5–5.1)
Sodium: 139 mmol/L (ref 135–145)
Total Bilirubin: 0.7 mg/dL (ref 0.3–1.2)
Total Protein: 6.8 g/dL (ref 6.5–8.1)

## 2021-08-05 LAB — CEA (IN HOUSE-CHCC): CEA (CHCC-In House): 22.1 ng/mL — ABNORMAL HIGH (ref 0.00–5.00)

## 2021-08-05 MED ORDER — CAPECITABINE 500 MG PO TABS: 1000.0000 mg/m2 | ORAL_TABLET | Freq: Two times a day (BID) | ORAL | 1 refills | Status: DC

## 2021-08-05 NOTE — Progress Notes (Signed)
[place]   [phone] [phone]   Clinic Follow up Note   Patient Care Team: Truitt Merle, MD as PCP - General (Hematology) Truitt Merle, MD as Consulting Physician (Oncology) Milus Banister, MD as Attending Physician (Gastroenterology) Kyung Rudd, MD as Consulting Physician (Radiation Oncology)  Date of Service:  08/05/2021  CHIEF COMPLAINT: f/u of metastatic colon cancer  CURRENT THERAPY:  CAPOX and bevacizumab, starting 03/13/21. Oxali and Beva on hold.  ASSESSMENT & PLAN:  Angela Mcpherson is a 60 y.o. female with   1. 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 She developed hematochezia and went to urgent care 05/2020. She met Dr. Ardis Hughs and underwent colonoscopy 02/04/21 showing rectosigmoid mass, with path confirming adenocarcinoma. -staging CT CAP 02/08/21 showed metastasis to lymph nodes and liver. PET on 03/04/21 confirmed hypermetabolism to rectal mass with extension into left perirectal fat, operator and periportal nodes, and two liver lesions. -FO showed MSS, low tumor mutation burden, and KRAS G12D mutation (+); baseline CEA 1,177.27 -she began Capox and bevacizumab on 03/13/21, is tolerating chemo well  -slightly worse cold sensitivity after cycle 2, recovered well  -restaging CT AP 05/18/21 showed decrease in size of rectosigmoid mass and left pelvic lymph node. -tumor marker CEA dropped significantly after starting chemo, indicating good response to treatment.  However, most recent CEA 11/14 showed slight rise to 23.62. Today's result is pending. -she was recently hospitalized and chemo has been held. She restarted Xeloda on 11/15. We have slowly increased her dose, and she will start 1500 mg BID tomorrow. We will continue to hold oxaliplatin and bevacizumab for now.   2. Acute thrombosis and new ascites   -admitted 05/18/21 with acute encephalopathy, colitis, abdominal aorta mural thrombosis, and thrombocytopenia. -she is on Eliquis and will continue  -she had paracentesis done on 06/27/21 for abdominal swelling/bloating; cytology was negative -she reports her bloating is much less now. She endorses taking Gas-X and Pepcid. -she also reports she has been able to gain weight.   3. Goal of care discussion -We previously discussed the incurable nature of her cancer, and the overall poor prognosis, especially if she does not have good response to chemotherapy or progress on chemo -The patient understands the goal of care is palliative. -she previously agreed with DNR, but changed to full code later on.      Plan -will start Xeloda 1574m q12hr, day 1-14 every 21 days tomorrow, continue to hold on oxaliplatin and bevacizumab for now -lab and f/u in 3 weeks   No problem-specific Assessment & Plan notes found for this encounter.   SUMMARY OF ONCOLOGIC HISTORY: Oncology History Overview Note  Cancer Staging Malignant neoplasm of rectosigmoid junction ([facility] Staging form: Colon and Rectum, AJCC 8th Edition - Clinical stage from 02/04/2021: Stage IVB (cTX, cN2, cM1b) - Signed by FTruitt Merle MD on 02/12/2021 Stage prefix: Initial diagnosis    [place]  02/04/2021 Procedure   Colonoscopy by Dr JArdis Hughs6/20/22 IMPRESSION - Two 2 to 6 mm polyps in the sigmoid colon and in the cecum, removed with a cold snare. Resected and retrieved. - A fungating, infiltrative and ulcerated partially obstructing mass was found in the rectosigmoid colon (distal edge about 7cm from the anus). The mass was partially circumferential (involving one-half of the lumen circumference). The mass measured eight cm in length. This was biopsied with a cold  forceps for histology. The distal edge of the mass was tattooed with two submucosal injections of Spot (carbon black). Sigmoid colon  felt somewhat fixed in place (large CRC or perhaps invasion into colon from other primary site?) - Internal hemorrhoids. - The examination was otherwise normal on direct and retroflexion views.   02/04/2021 Pathology Results   Diagnosis 1. 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 - NO HIGH-GRADE DYSPLASIA OR CARCINOMA. 2. Colon, biopsy, rectosigmoid - INVASIVE ADENOCARCINOMA. - SEE NOTE.   02/04/2021 Cancer Staging   Staging form: Colon and Rectum, AJCC 8th Edition - Clinical stage from 02/04/2021: Stage IVB (cTX, cN2, cM1b) - Signed by Truitt Merle, MD on 02/12/2021 Stage prefix: Initial diagnosis    02/08/2021 Imaging   CT CHEST, ABDOMEN AND PELVIS W CONTRAST  IMPRESSION: 1. There is a large, concentric mass of the rectosigmoid junction measuring 7.8 x 5.9 x 5.2 cm. Findings are consistent with primary colon malignancy identified by colonoscopy. 2. Multiple enlarged perirectal, bilateral pelvic sidewall, and retroperitoneal lymph nodes, consistent with nodal metastatic disease. 3. There are multiple small low-attenuation lesions of the liver parenchyma, incompletely characterized although suspicious for hepatic metastatic disease. PET-CT or multiphasic contrast enhanced MRI could be used to more clearly characterize. 4. There are multiple small bilateral pulmonary nodules, measuring 3 mm and smaller, nonspecific although highly suspicious for early pulmonary metastatic disease given overall constellation of findings.   02/12/2021 Initial Diagnosis   Malignant neoplasm of rectosigmoid junction ([place])   03/04/2021 PET scan   IMPRESSION: 1. Intense hypermetabolic activity associated with rectal mass consistent with primary colorectal carcinoma. 2. Local extension of primary tumor into the LEFT perirectal fat. 3. Hypermetabolic LEFT operator node consistent with local nodal metastasis. 4. Hypermetabolic metastasis to the LEFT and RIGHT hepatic lobe (two  lesions). 5. Hypermetabolic periportal lymph node. 6. Small pulmonary nodules without radiotracer activity. Recommend attention on follow-up.   03/13/2021 -  Chemotherapy    Patient is on Treatment Plan: COLORECTAL XELOX (CAPEOX) Q21D        Genetic Testing   Negative genetic testing. No pathogenic variants identified on the [facility] Multi-Cancer+RNA Panel. 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 The report date is 04/01/2021.  The Multi-Cancer Panel + RNA offered by [facility] includes sequencing and/or deletion duplication testing of the following 84 genes: AIP, ALK, APC, ATM, AXIN2,BAP1,  BARD1, BLM, BMPR1A, BRCA1, BRCA2, BRIP1, CASR, CDC73, CDH1, CDK4, CDKN1B, CDKN1C, CDKN2A (p14ARF), CDKN2A (p16INK4a), CEBPA, CHEK2, CTNNA1, DICER1, DIS3L2, EGFR (c.2369C>T, p.Thr790Met variant only), EPCAM (Deletion/duplication testing only), FH, FLCN, GATA2, GPC3, GREM1 (Promoter region deletion/duplication testing only), HOXB13 (c.251G>A, p.Gly84Glu), HRAS, KIT, MAX, MEN1, MET, MITF (c.952G>A, p.Glu318Lys variant only), MLH1, MSH2, MSH3, MSH6, MUTYH, NBN, NF1, NF2, NTHL1, PALB2, PDGFRA, PHOX2B, PMS2, POLD1, POLE, POT1, PRKAR1A, PTCH1, PTEN, RAD50, RAD51C, RAD51D, RB1, RECQL4, RET, RUNX1, SDHAF2, SDHA (sequence changes only), SDHB, SDHC, SDHD, SMAD4, SMARCA4, SMARCB1, SMARCE1, STK11, SUFU, TERC, TERT, TMEM127, TP53, TSC1, TSC2, VHL, WRN and WT1.   05/18/2021 Imaging   CT AP  IMPRESSION: 1. 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 2. Wall thickening of the ascending colon, cecum and ileum worrisome for enterocolitis. Findings may be infectious, inflammatory or ischemic given findings in the aorta. No pneumatosis or free air. 3. Small amount of ascites. 4. Rectosigmoid mass has decreased in size. Left pelvic lymph node has decreased in size. 5. Previously identified liver lesions not well characterized on this study secondary to motion artifact.  INTERVAL HISTORY:  Angela Mcpherson is here for a follow up of metastatic colon cancer. She was last seen by me on 07/15/21. She presents to the clinic accompanied by her husband. She reports she is doing much better-- she does not need shower stool, she is ambulating on her own, and she is up >50% of the day.   All other systems were reviewed with the patient and are negative.  MEDICAL HISTORY:  Past Medical History:  Diagnosis Date   Colorectal cancer ([place])    Family history of brain cancer    Family history of prostate cancer    Family history of stomach cancer    Heart murmur    dx at 26   MVP is stable   Hemorrhoids     SURGICAL HISTORY: Past Surgical History:  Procedure Laterality Date   NO PAST SURGERIES      I have reviewed the social history and family history with the patient and they are unchanged from previous note.  ALLERGIES:  is allergic to codeine, epinephrine (anaphylaxis), orange fruit [citrus], streptogramins, and tomato.  MEDICATIONS:  Current Outpatient Medications  Medication Sig Dispense Refill   acetaminophen (TYLENOL) 325 MG tablet Take 650 mg by mouth every 6 (six) hours as needed for moderate pain.     apixaban (ELIQUIS) 5 MG TABS tablet Take 2 tablets (10 mg total) by mouth 2 (two) times daily for 2 days, THEN 1 tablet (5 mg total) 2 (two) times daily for 28 days. 64 tablet 1   capecitabine (XELODA) 500 MG tablet 2 tabs in morning and 3 tabs in evening every 12 hours, for 14 days then off 7 days. Patient needs 28 tabs to complete this cycle. Starting next cycle, take 3 tabs every 12 hours for 14 days then off 7 days 112 tablet 0   cholecalciferol (VITAMIN D3) 25 MCG (1000 UNIT) tablet Take 1,000 Units by mouth daily.     loperamide (IMODIUM) 2 MG capsule Take 1 capsule (2 mg total) by mouth as needed for diarrhea or loose stools. 30 capsule 0   ondansetron (ZOFRAN) 8 MG tablet Take 1 tablet (8 mg total) by mouth 2 (two) times daily as needed for  refractory nausea / vomiting. Start on day 3 after chemotherapy. 30 tablet 1   traMADol (ULTRAM) 50 MG tablet Take 0.5-1 tablets (25-50 mg total) by mouth every 8 (eight) hours as needed. (Patient taking differently: Take 25-50 mg by mouth every 8 (eight) hours as needed for moderate pain.) 30 tablet 0   VALERIAN ROOT PO Take 1 tablet by mouth daily.     No current facility-administered medications for this visit.    PHYSICAL EXAMINATION: ECOG PERFORMANCE STATUS: 2 - Symptomatic, <50% confined to bed  Vitals:   08/05/21 1324  BP: 108/78  Pulse: 85  Resp: 18  Temp: 97.9 F (36.6 C)  SpO2: 100%   Wt Readings from Last 3 Encounters:  08/05/21 110 lb 11.2 oz (50.2 kg)  07/15/21 107 lb 11.2 oz (48.9 kg)  07/01/21 108 lb 11.2 oz (49.3 kg)     GENERAL:alert, no distress and comfortable SKIN: skin color normal, no rashes or significant lesions EYES: normal, Conjunctiva are pink and non-injected, sclera clear  NEURO: alert & oriented x 3 with fluent speech  LABORATORY DATA:  I have reviewed the data as listed CBC Latest Ref Rng & Units 08/05/2021 07/15/2021 07/01/2021  WBC 4.0 - 10.5 K/uL 4.3 5.1 5.5  Hemoglobin 12.0 - 15.0 g/dL 9.6(L) 10.6(L)  10.0(L)  Hematocrit 36.0 - 46.0 % 29.7(L) 33.3(L) 31.6(L)  Platelets 150 - 400 K/uL 109(L) 134(L) 134(L)     CMP Latest Ref Rng & Units 08/05/2021 07/15/2021 07/01/2021  Glucose 70 - 99 mg/dL 98 88 96  BUN 6 - 20 mg/dL _0 Creatinine 0.44 - 1.00 mg/dL 0.70 0.68 0.59  Sodium 135 - 145 mmol/L 139 139 138  Potassium 3.5 - 5.1 mmol/L 3.8 4.1 3.3(L)  Chloride 98 - 111 mmol/L 106 106 104  CO2 22 - 32 mmol/L _1 Calcium 8.9 - 10.3 mg/dL 8.9 8.9 8.3(L)  Total Protein 6.5 - 8.1 g/dL 6.8 6.8 6.2(L)  Total Bilirubin 0.3 - 1.2 mg/dL 0.7 0.6 0.6  Alkaline Phos 38 - 126 U/L 113 140(H) 158(H)  AST 15 - 41 U/L 40 50(H) 61(H)  ALT 0 - 44 U/L 29 45(H) 41      RADIOGRAPHIC STUDIES: I have personally reviewed the radiological images as  listed and agreed with the findings in the report. No results found.    No orders of the defined types were placed in this encounter.  All questions were answered. The patient knows to call the clinic with any problems, questions or concerns. No barriers to learning was detected. The total time spent in the appointment was 30 minutes.     Truitt Merle, MD 08/05/2021   I, Wilburn Mylar, am acting as scribe for Truitt Merle, MD.   I have reviewed the above documentation for accuracy and completeness, and I agree with the above.

## 2021-08-12 ENCOUNTER — Encounter: Payer: Self-pay | Admitting: Hematology

## 2021-08-14 ENCOUNTER — Other Ambulatory Visit: Payer: Self-pay | Admitting: Hematology

## 2021-08-14 ENCOUNTER — Other Ambulatory Visit: Payer: Self-pay

## 2021-08-26 ENCOUNTER — Other Ambulatory Visit: Payer: Self-pay

## 2021-08-26 ENCOUNTER — Other Ambulatory Visit: Payer: 59

## 2021-08-26 ENCOUNTER — Inpatient Hospital Stay: Payer: 59

## 2021-08-26 ENCOUNTER — Inpatient Hospital Stay: Payer: 59 | Attending: Hematology | Admitting: Hematology

## 2021-08-26 VITALS — BP 110/75 | HR 86 | Temp 97.9°F | Resp 18 | Ht 64.0 in | Wt 114.2 lb

## 2021-08-26 DIAGNOSIS — C787 Secondary malignant neoplasm of liver and intrahepatic bile duct: Secondary | ICD-10-CM | POA: Diagnosis not present

## 2021-08-26 DIAGNOSIS — Z7901 Long term (current) use of anticoagulants: Secondary | ICD-10-CM | POA: Insufficient documentation

## 2021-08-26 DIAGNOSIS — C19 Malignant neoplasm of rectosigmoid junction: Secondary | ICD-10-CM

## 2021-08-26 DIAGNOSIS — R188 Other ascites: Secondary | ICD-10-CM | POA: Insufficient documentation

## 2021-08-26 DIAGNOSIS — K7460 Unspecified cirrhosis of liver: Secondary | ICD-10-CM | POA: Diagnosis not present

## 2021-08-26 DIAGNOSIS — R161 Splenomegaly, not elsewhere classified: Secondary | ICD-10-CM | POA: Insufficient documentation

## 2021-08-26 DIAGNOSIS — Z5112 Encounter for antineoplastic immunotherapy: Secondary | ICD-10-CM | POA: Diagnosis present

## 2021-08-26 DIAGNOSIS — Z86718 Personal history of other venous thrombosis and embolism: Secondary | ICD-10-CM | POA: Diagnosis not present

## 2021-08-26 LAB — CMP (CANCER CENTER ONLY)
ALT: 29 U/L (ref 0–44)
AST: 39 U/L (ref 15–41)
Albumin: 3.7 g/dL (ref 3.5–5.0)
Alkaline Phosphatase: 117 U/L (ref 38–126)
Anion gap: 5 (ref 5–15)
BUN: 9 mg/dL (ref 6–20)
CO2: 27 mmol/L (ref 22–32)
Calcium: 9.7 mg/dL (ref 8.9–10.3)
Chloride: 107 mmol/L (ref 98–111)
Creatinine: 0.65 mg/dL (ref 0.44–1.00)
GFR, Estimated: >60 mL/min (ref >60)
Glucose, Bld: 98 mg/dL (ref 70–99)
Potassium: 4.7 mmol/L (ref 3.5–5.1)
Sodium: 139 mmol/L (ref 135–145)
Total Bilirubin: 0.9 mg/dL (ref 0.3–1.2)
Total Protein: 6.7 g/dL (ref 6.5–8.1)

## 2021-08-26 LAB — CBC WITH DIFFERENTIAL (CANCER CENTER ONLY)
Abs Immature Granulocytes: 0 10*3/uL (ref 0.00–0.07)
Basophils Absolute: 0 10*3/uL (ref 0.0–0.1)
Basophils Relative: 0 %
Eosinophils Absolute: 0.1 10*3/uL (ref 0.0–0.5)
Eosinophils Relative: 2 %
HCT: 29.3 % — ABNORMAL LOW (ref 36.0–46.0)
Hemoglobin: 9.7 g/dL — ABNORMAL LOW (ref 12.0–15.0)
Immature Granulocytes: 0 %
Lymphocytes Relative: 27 %
Lymphs Abs: 1.3 10*3/uL (ref 0.7–4.0)
MCH: 31.1 pg (ref 26.0–34.0)
MCHC: 33.1 g/dL (ref 30.0–36.0)
MCV: 93.9 fL (ref 80.0–100.0)
Monocytes Absolute: 0.8 10*3/uL (ref 0.1–1.0)
Monocytes Relative: 18 %
Neutro Abs: 2.5 10*3/uL (ref 1.7–7.7)
Neutrophils Relative %: 53 %
Platelet Count: 103 10*3/uL — ABNORMAL LOW (ref 150–400)
RBC: 3.12 MIL/uL — ABNORMAL LOW (ref 3.87–5.11)
RDW: 23.9 % — ABNORMAL HIGH (ref 11.5–15.5)
WBC Count: 4.7 10*3/uL (ref 4.0–10.5)
nRBC: 0 % (ref 0.0–0.2)

## 2021-08-26 MED ORDER — APIXABAN 5 MG PO TABS: 5.0000 mg | ORAL_TABLET | Freq: Two times a day (BID) | ORAL | 2 refills | Status: DC

## 2021-08-26 NOTE — Progress Notes (Signed)
[place]   Telephone:([phone]) [phone] Fax:([phone]) [phone]   Clinic Follow up Note   Patient Care Team: Truitt Merle, MD as PCP - General (Hematology) Truitt Merle, MD as Consulting Physician (Oncology) Milus Banister, MD as Attending Physician (Gastroenterology) Kyung Rudd, MD as Consulting Physician (Radiation Oncology)  Date of Service:  08/26/2021  CHIEF COMPLAINT: f/u of metastatic colon cancer  CURRENT THERAPY:  Xeloda and bevacizumab, started 03/13/21.  -Xeloda held 9/26-11/14/22, Beva held 04/25/21-09/15/21 due to hospitalization.  ASSESSMENT & PLAN:  Angela Mcpherson is a 61 y.o. female with   1. 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 She developed hematochezia and went to urgent care 05/2020. She met Dr. Ardis Hughs and underwent colonoscopy 02/04/21 showing rectosigmoid mass, with path confirming adenocarcinoma. -staging CT CAP 02/08/21 showed metastasis to lymph nodes and liver. PET on 03/04/21 confirmed hypermetabolism to rectal mass with extension into left perirectal fat, operator and periportal nodes, and two liver lesions. -FO showed MSS, low tumor mutation burden, and KRAS G12D mutation (+); baseline CEA 1,177.27 -she began Capox and bevacizumab on 03/13/21, is tolerating chemo well  -restaging CT AP 05/18/21 showed decrease in size of rectosigmoid mass and left pelvic lymph node. -tumor marker CEA dropped significantly after starting chemo, indicating good response to treatment.  -she was hospitalized 05/18/21 and chemo was held. She restarted Xeloda on 11/15. We have slowly increased her dose, currently 1500 mg BID.  -we discussed restarting bevacizumab. I do not plan to restart the oxaliplatin. We will also obtain a scan prior to restarting the beva in 3 weeks    2. Acute thrombosis and new ascites  -admitted 05/18/21 with acute  encephalopathy, colitis, abdominal aorta mural thrombosis, and thrombocytopenia. -she is on Eliquis and will continue  -she had paracentesis done on 06/27/21 for abdominal swelling/bloating; cytology was negative -she reports her bloating is much less now. She endorses taking Gas-X and Pepcid. -she continues to gain weight back.   3. Goal of care discussion -We previously discussed the incurable nature of her cancer, and the overall poor prognosis, especially if she does not have good response to chemotherapy or progress on chemo -The patient understands the goal of care is palliative. -she previously agreed with DNR, but changed to full code later on.      Plan -continue Xeloda 151m q12hr, day 1-14 every 21 days, she will start next cycle tomorrow  -I refilled her elliquis -f/u and beva in 3 weeks, with lab and restaging scan several days before.   No problem-specific Assessment & Plan notes found for this encounter.   SUMMARY OF ONCOLOGIC HISTORY: Oncology History Overview Note  Cancer Staging Malignant neoplasm of rectosigmoid junction ([facility] Staging form: Colon and Rectum, AJCC 8th Edition - Clinical stage from 02/04/2021: Stage IVB (cTX, cN2, cM1b) - Signed by FTruitt Merle MD on 02/12/2021 Stage prefix: Initial diagnosis    Malignant neoplasm of rectosigmoid junction ([place]  02/04/2021 Procedure   Colonoscopy by Dr JArdis Hughs6/20/22 IMPRESSION - Two 2 to 6 mm polyps in the sigmoid colon and in the cecum, removed with a cold snare. Resected and retrieved. - A fungating, infiltrative and ulcerated partially obstructing mass was found in the rectosigmoid colon (distal edge about 7cm from the anus). The mass was partially circumferential (involving one-half of the lumen circumference). The mass measured eight cm in length. This was biopsied with a cold forceps for histology.  The distal edge of the mass was tattooed with two submucosal injections of Spot (carbon black). Sigmoid colon  felt somewhat fixed in place (large CRC or perhaps invasion into colon from other primary site?) - Internal hemorrhoids. - The examination was otherwise normal on direct and retroflexion views.   02/04/2021 Pathology Results   Diagnosis 1. 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 - NO HIGH-GRADE DYSPLASIA OR CARCINOMA. 2. Colon, biopsy, rectosigmoid - INVASIVE ADENOCARCINOMA. - SEE NOTE.   02/04/2021 Cancer Staging   Staging form: Colon and Rectum, AJCC 8th Edition - Clinical stage from 02/04/2021: Stage IVB (cTX, cN2, cM1b) - Signed by Truitt Merle, MD on 02/12/2021 Stage prefix: Initial diagnosis    02/08/2021 Imaging   CT CHEST, ABDOMEN AND PELVIS W CONTRAST  IMPRESSION: 1. There is a large, concentric mass of the rectosigmoid junction measuring 7.8 x 5.9 x 5.2 cm. Findings are consistent with primary colon malignancy identified by colonoscopy. 2. Multiple enlarged perirectal, bilateral pelvic sidewall, and retroperitoneal lymph nodes, consistent with nodal metastatic disease. 3. There are multiple small low-attenuation lesions of the liver parenchyma, incompletely characterized although suspicious for hepatic metastatic disease. PET-CT or multiphasic contrast enhanced MRI could be used to more clearly characterize. 4. There are multiple small bilateral pulmonary nodules, measuring 3 mm and smaller, nonspecific although highly suspicious for early pulmonary metastatic disease given overall constellation of findings.   02/12/2021 Initial Diagnosis   Malignant neoplasm of rectosigmoid junction ([place])   03/04/2021 PET scan   IMPRESSION: 1. Intense hypermetabolic activity associated with rectal mass consistent with primary colorectal carcinoma. 2. Local extension of primary tumor into the LEFT perirectal fat. 3. Hypermetabolic LEFT operator node consistent with local nodal metastasis. 4. Hypermetabolic metastasis to the LEFT and RIGHT hepatic lobe (two  lesions). 5. Hypermetabolic periportal lymph node. 6. Small pulmonary nodules without radiotracer activity. Recommend attention on follow-up.   03/13/2021 -  Chemotherapy    Patient is on Treatment Plan: COLORECTAL XELOX (CAPEOX) Q21D        Genetic Testing   Negative genetic testing. No pathogenic variants identified on the [facility] Multi-Cancer+RNA Panel. 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 The report date is 04/01/2021.  The Multi-Cancer Panel + RNA offered by [facility] includes sequencing and/or deletion duplication testing of the following 84 genes: AIP, ALK, APC, ATM, AXIN2,BAP1,  BARD1, BLM, BMPR1A, BRCA1, BRCA2, BRIP1, CASR, CDC73, CDH1, CDK4, CDKN1B, CDKN1C, CDKN2A (p14ARF), CDKN2A (p16INK4a), CEBPA, CHEK2, CTNNA1, DICER1, DIS3L2, EGFR (c.2369C>T, p.Thr790Met variant only), EPCAM (Deletion/duplication testing only), FH, FLCN, GATA2, GPC3, GREM1 (Promoter region deletion/duplication testing only), HOXB13 (c.251G>A, p.Gly84Glu), HRAS, KIT, MAX, MEN1, MET, MITF (c.952G>A, p.Glu318Lys variant only), MLH1, MSH2, MSH3, MSH6, MUTYH, NBN, NF1, NF2, NTHL1, PALB2, PDGFRA, PHOX2B, PMS2, POLD1, POLE, POT1, PRKAR1A, PTCH1, PTEN, RAD50, RAD51C, RAD51D, RB1, RECQL4, RET, RUNX1, SDHAF2, SDHA (sequence changes only), SDHB, SDHC, SDHD, SMAD4, SMARCA4, SMARCB1, SMARCE1, STK11, SUFU, TERC, TERT, TMEM127, TP53, TSC1, TSC2, VHL, WRN and WT1.   05/18/2021 Imaging   CT AP  IMPRESSION: 1. 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 2. Wall thickening of the ascending colon, cecum and ileum worrisome for enterocolitis. Findings may be infectious, inflammatory or ischemic given findings in the aorta. No pneumatosis or free air. 3. Small amount of ascites. 4. Rectosigmoid mass has decreased in size. Left pelvic lymph node has decreased in size. 5. Previously identified liver lesions not well characterized on this study secondary to motion artifact.  INTERVAL HISTORY:  Angela Mcpherson is here for a follow up of metastatic colon cancer. She was last seen by me on 08/05/21. She presents to the clinic accompanied by her husband. She reports she experienced some mouth sores from the Xeloda, as well as some skin toxicity. She reports she is continuing to improve since discharge. Her husband feels she is back to normal. She has not needed pain medication lately, and only uses a half of the tramadol if necessary. She also reports she has gained weight, is eating well, and feels her mood is also better. She also reports her bowel movements have returned to normal.   All other systems were reviewed with the patient and are negative.  MEDICAL HISTORY:  Past Medical History:  Diagnosis Date   Colorectal cancer ([place])    Family history of brain cancer    Family history of prostate cancer    Family history of stomach cancer    Heart murmur    dx at 26   MVP is stable   Hemorrhoids     SURGICAL HISTORY: Past Surgical History:  Procedure Laterality Date   NO PAST SURGERIES      I have reviewed the social history and family history with the patient and they are unchanged from previous note.  ALLERGIES:  is allergic to codeine, epinephrine (anaphylaxis), orange fruit [citrus], streptogramins, and tomato.  MEDICATIONS:  Current Outpatient Medications  Medication Sig Dispense Refill   apixaban (ELIQUIS) 5 MG TABS tablet Take 1 tablet (5 mg total) by mouth 2 (two) times daily. 60 tablet 2   acetaminophen (TYLENOL) 325 MG tablet Take 650 mg by mouth every 6 (six) hours as needed for moderate pain.     capecitabine (XELODA) 500 MG tablet TAKE 3 TABLETS BY MOUTH TWICE  DAILY (12 HOURS APART) FOR 14  DAYS ON THEN 7 DAYS OFF 84 tablet 1   cholecalciferol (VITAMIN D3) 25 MCG (1000 UNIT) tablet Take 1,000 Units by mouth daily.     loperamide (IMODIUM) 2 MG capsule Take 1 capsule (2 mg total) by mouth as needed for diarrhea or loose stools. 30  capsule 0   ondansetron (ZOFRAN) 8 MG tablet Take 1 tablet (8 mg total) by mouth 2 (two) times daily as needed for refractory nausea / vomiting. Start on day 3 after chemotherapy. 30 tablet 1   VALERIAN ROOT PO Take 1 tablet by mouth daily.     No current facility-administered medications for this visit.    PHYSICAL EXAMINATION: ECOG PERFORMANCE STATUS: 1 - Symptomatic but completely ambulatory  Vitals:   08/26/21 1020  BP: 110/75  Pulse: 86  Resp: 18  Temp: 97.9 F (36.6 C)  SpO2: 100%   Wt Readings from Last 3 Encounters:  08/26/21 114 lb 3.2 oz (51.8 kg)  08/05/21 110 lb 11.2 oz (50.2 kg)  07/15/21 107 lb 11.2 oz (48.9 kg)     GENERAL:alert, no distress and comfortable SKIN: skin color, texture, turgor are normal, no rashes or significant lesions EYES: normal, Conjunctiva are pink and non-injected, sclera clear  NECK: supple, thyroid normal size, non-tender, without nodularity LYMPH:  no palpable lymphadenopathy in the cervical, axillary  LUNGS: clear to auscultation and percussion with normal breathing effort HEART: regular rate & rhythm and no murmurs and no lower extremity edema ABDOMEN:abdomen soft, non-tender and normal bowel sounds Musculoskeletal:no cyanosis of digits and no clubbing  NEURO: alert & oriented x 3 with fluent speech, no focal motor/sensory deficits  LABORATORY DATA:  I  have reviewed the data as listed CBC Latest Ref Rng & Units 08/26/2021 08/05/2021 07/15/2021  WBC 4.0 - 10.5 K/uL 4.7 4.3 5.1  Hemoglobin 12.0 - 15.0 g/dL 9.7(L) 9.6(L) 10.6(L)  Hematocrit 36.0 - 46.0 % 29.3(L) 29.7(L) 33.3(L)  Platelets 150 - 400 K/uL 103(L) 109(L) 134(L)     CMP Latest Ref Rng & Units 08/26/2021 08/05/2021 07/15/2021  Glucose 70 - 99 mg/dL 98 98 88  BUN 6 - 20 mg/dL 9 10 8   Creatinine 0.44 - 1.00 mg/dL 0.65 0.70 0.68  Sodium 135 - 145 mmol/L 139 139 139  Potassium 3.5 - 5.1 mmol/L 4.7 3.8 4.1  Chloride 98 - 111 mmol/L 107 106 106  CO2 22 - 32 mmol/L 27 26 25    Calcium 8.9 - 10.3 mg/dL 9.7 8.9 8.9  Total Protein 6.5 - 8.1 g/dL 6.7 6.8 6.8  Total Bilirubin 0.3 - 1.2 mg/dL 0.9 0.7 0.6  Alkaline Phos 38 - 126 U/L 117 113 140(H)  AST 15 - 41 U/L 39 40 50(H)  ALT 0 - 44 U/L 29 29 45(H)      RADIOGRAPHIC STUDIES: I have personally reviewed the radiological images as listed and agreed with the findings in the report. No results found.    Orders Placed This Encounter  Procedures   CT CHEST ABDOMEN PELVIS W CONTRAST    Standing Status:   Future    Standing Expiration Date:   08/26/2022    Order Specific Question:   Is patient pregnant?    Answer:   No    Order Specific Question:   Preferred imaging location?    Answer:   [facility]    Order Specific Question:   Is Oral Contrast requested for this exam?    Answer:   Yes, Per Radiology protocol   All questions were answered. The patient knows to call the clinic with any problems, questions or concerns. No barriers to learning was detected. The total time spent in the appointment was 30 minutes.     Truitt Merle, MD 08/26/2021   I, Wilburn Mylar, am acting as scribe for Truitt Merle, MD.   I have reviewed the above documentation for accuracy and completeness, and I agree with the above.

## 2021-08-29 ENCOUNTER — Encounter: Payer: Self-pay | Admitting: Hematology

## 2021-09-12 ENCOUNTER — Ambulatory Visit (HOSPITAL_COMMUNITY)
Admission: RE | Admit: 2021-09-12 | Discharge: 2021-09-12 | Disposition: A | Discharge status: Home / Self Care | Payer: 59 | Source: Ambulatory Visit | Attending: Hematology | Admitting: Hematology

## 2021-09-12 ENCOUNTER — Other Ambulatory Visit: Payer: Self-pay

## 2021-09-12 DIAGNOSIS — C19 Malignant neoplasm of rectosigmoid junction: Secondary | ICD-10-CM | POA: Insufficient documentation

## 2021-09-12 MED ORDER — IOHEXOL 300 MG/ML  SOLN
80.0000 mL | Freq: Once | INTRAMUSCULAR | Status: AC | PRN
  Administered 2021-09-12: 80 mL via INTRAVENOUS

## 2021-09-16 ENCOUNTER — Inpatient Hospital Stay: Payer: 59 | Admitting: Hematology

## 2021-09-16 ENCOUNTER — Other Ambulatory Visit: Payer: Self-pay

## 2021-09-16 ENCOUNTER — Inpatient Hospital Stay: Payer: 59

## 2021-09-16 VITALS — BP 101/70 | HR 87 | Temp 97.8°F | Resp 18 | Ht 64.0 in | Wt 117.1 lb

## 2021-09-16 DIAGNOSIS — C19 Malignant neoplasm of rectosigmoid junction: Secondary | ICD-10-CM

## 2021-09-16 DIAGNOSIS — Z5112 Encounter for antineoplastic immunotherapy: Secondary | ICD-10-CM | POA: Diagnosis not present

## 2021-09-16 DIAGNOSIS — K7460 Unspecified cirrhosis of liver: Secondary | ICD-10-CM | POA: Diagnosis not present

## 2021-09-16 LAB — CBC WITH DIFFERENTIAL (CANCER CENTER ONLY)
Abs Immature Granulocytes: 0.01 10*3/uL (ref 0.00–0.07)
Basophils Absolute: 0 10*3/uL (ref 0.0–0.1)
Basophils Relative: 0 %
Eosinophils Absolute: 0.1 10*3/uL (ref 0.0–0.5)
Eosinophils Relative: 3 %
HCT: 27.7 % — ABNORMAL LOW (ref 36.0–46.0)
Hemoglobin: 9.4 g/dL — ABNORMAL LOW (ref 12.0–15.0)
Immature Granulocytes: 0 %
Lymphocytes Relative: 27 %
Lymphs Abs: 1.3 10*3/uL (ref 0.7–4.0)
MCH: 31.6 pg (ref 26.0–34.0)
MCHC: 33.9 g/dL (ref 30.0–36.0)
MCV: 93.3 fL (ref 80.0–100.0)
Monocytes Absolute: 0.8 10*3/uL (ref 0.1–1.0)
Monocytes Relative: 17 %
Neutro Abs: 2.5 10*3/uL (ref 1.7–7.7)
Neutrophils Relative %: 53 %
Platelet Count: 113 10*3/uL — ABNORMAL LOW (ref 150–400)
RBC: 2.97 MIL/uL — ABNORMAL LOW (ref 3.87–5.11)
RDW: 25.4 % — ABNORMAL HIGH (ref 11.5–15.5)
WBC Count: 4.7 10*3/uL (ref 4.0–10.5)
nRBC: 0 % (ref 0.0–0.2)

## 2021-09-16 LAB — CMP (CANCER CENTER ONLY)
ALT: 25 U/L (ref 0–44)
AST: 34 U/L (ref 15–41)
Albumin: 3.9 g/dL (ref 3.5–5.0)
Alkaline Phosphatase: 105 U/L (ref 38–126)
Anion gap: 7 (ref 5–15)
BUN: 11 mg/dL (ref 6–20)
CO2: 25 mmol/L (ref 22–32)
Calcium: 9.5 mg/dL (ref 8.9–10.3)
Chloride: 107 mmol/L (ref 98–111)
Creatinine: 0.66 mg/dL (ref 0.44–1.00)
GFR, Estimated: >60 mL/min (ref >60)
Glucose, Bld: 93 mg/dL (ref 70–99)
Potassium: 3.9 mmol/L (ref 3.5–5.1)
Sodium: 139 mmol/L (ref 135–145)
Total Bilirubin: 1.4 mg/dL — ABNORMAL HIGH (ref 0.3–1.2)
Total Protein: 6.5 g/dL (ref 6.5–8.1)

## 2021-09-16 LAB — TOTAL PROTEIN, URINE DIPSTICK: Protein, ur: NEGATIVE mg/dL

## 2021-09-16 LAB — CEA (IN HOUSE-CHCC): CEA (CHCC-In House): 22.86 ng/mL — ABNORMAL HIGH (ref 0.00–5.00)

## 2021-09-16 MED ORDER — CAPECITABINE 500 MG PO TABS: ORAL_TABLET | ORAL | 1 refills | Status: DC

## 2021-09-16 MED ORDER — SODIUM CHLORIDE 0.9 % IV SOLN: Freq: Once | INTRAVENOUS | Status: AC

## 2021-09-16 MED ORDER — SODIUM CHLORIDE 0.9 % IV SOLN
7.5000 mg/kg | Freq: Once | INTRAVENOUS | Status: AC
  Administered 2021-09-16: 400 mg via INTRAVENOUS
  Filled 2021-09-16: qty 16

## 2021-09-16 NOTE — Progress Notes (Signed)
[facility]   [phone] Fax:([phone]) [phone]   Clinic Follow up Note   Patient Care Team: Truitt Merle, MD as PCP - General (Hematology) Truitt Merle, MD as Consulting Physician (Oncology) Milus Banister, MD as Attending Physician (Gastroenterology) Kyung Rudd, MD as Consulting Physician (Radiation Oncology)  Date of Service:  09/16/2021  CHIEF COMPLAINT: f/u of metastatic colon cancer  CURRENT THERAPY:  Xeloda and bevacizumab, started 03/13/21.  -Xeloda held 9/26-11/14/22, Beva held 04/25/21-09/15/21 due to hospitalization.  ASSESSMENT & PLAN:  Angela Mcpherson is a 61 y.o. female with   1. 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 She developed hematochezia and went to urgent care 05/2020. She met Dr. Ardis Hughs and underwent colonoscopy 02/04/21 showing rectosigmoid mass, with path confirming adenocarcinoma. -staging CT CAP 02/08/21 showed metastasis to lymph nodes and liver. PET on 03/04/21 confirmed hypermetabolism to rectal mass with extension into left perirectal fat, operator and periportal nodes, and two liver lesions. -FO showed MSS, low tumor mutation burden, and KRAS G12D mutation (+); baseline CEA 1,177.27 -she began Capox and bevacizumab on 03/13/21, is tolerating chemo well  -tumor marker CEA dropped significantly after starting chemo, indicating good response to treatment.  -she was hospitalized 05/18/21 and chemo was held. She restarted Xeloda on 11/15. We have slowly increased her dose, currently 1500 mg BID.  -restaging CT CAP 09/12/21 showed treatment response to tiny b/l pulmonary nodules and hepatic metastases, but increased size of a pelvic lymph node. I reviewed the results with her today. This is mixed response, but overall stable disease.  Her pelvic adenopathy could also be reactive to her previous colitis and ascites. -I  discussed the option of continuing Xeloda and restating Bevacizumab, vs adding low dose oxaliplatin to Xeloda and Beva.  However due to her recent new mild liver cirrhosis on scan, I am concerned about hepatic toxicity from oxaliplatin.  Patient is also concerned if she will get sick again from oxaliplatin.  We will hold oxaliplatin for now. -Lab reviewed, adequate for treatment, will proceed bevacizumab infusion today.  She will continue Xeloda 1555m twice daily for 14 days, then off 7 days.   2. history of thrombosis and new ascites  -admitted 05/18/21 with acute encephalopathy, colitis, abdominal aorta mural thrombosis, and thrombocytopenia. -she is on Eliquis and will continue  -she had paracentesis done on 06/27/21 for abdominal swelling/bloating; cytology was negative -she reports her bloating is much less now. She endorses taking Gas-X and Pepcid.  3.  Mild liver cirrhosis and splenomegaly -Her recent CT scan showed moderately coarse nodular contour of the liver and splenomegaly, concerning for early liver cirrhosis.  She also had new onset ascites in October 2022, which has resolved now  -Her husband had a hepatitis C and was treated.  Patient has never been tested, I will order for next lab draw   4. Goal of care discussion -We previously discussed the incurable nature of her cancer, and the overall poor prognosis, especially if she does not have good response to chemotherapy or progress on chemo -The patient understands the goal of care is palliative. -she previously agreed with DNR, but changed to full code later on.      Plan -scan and lab reviewed, will proceed with Beva today and continue every 3 weeks -continue Xeloda [id number], day 1-14 every 21 days -lab, f/u, and beva in 3 weeks   No problem-specific  Assessment & Plan notes found for this encounter.   SUMMARY OF ONCOLOGIC HISTORY: Oncology History Overview Note  Cancer Staging Malignant neoplasm of rectosigmoid  junction [facility]) Staging form: Colon and Rectum, AJCC 8th Edition - Clinical stage from 02/04/2021: Stage IVB (cTX, cN2, cM1b) - Signed by Truitt Merle, MD on 02/12/2021 Stage prefix: Initial diagnosis    Malignant neoplasm of rectosigmoid junction ([place])  02/04/2021 Procedure   Colonoscopy by Dr Ardis Hughs 02/04/21 IMPRESSION - Two 2 to 6 mm polyps in the sigmoid colon and in the cecum, removed with a cold snare. Resected and retrieved. - A fungating, infiltrative and ulcerated partially obstructing mass was found in the rectosigmoid colon (distal edge about 7cm from the anus). The mass was partially circumferential (involving one-half of the lumen circumference). The mass measured eight cm in length. This was biopsied with a cold forceps for histology. The distal edge of the mass was tattooed with two submucosal injections of Spot (carbon black). Sigmoid colon felt somewhat fixed in place (large CRC or perhaps invasion into colon from other primary site?) - Internal hemorrhoids. - The examination was otherwise normal on direct and retroflexion views.   02/04/2021 Pathology Results   Diagnosis 1. 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 - NO HIGH-GRADE DYSPLASIA OR CARCINOMA. 2. Colon, biopsy, rectosigmoid - INVASIVE ADENOCARCINOMA. - SEE NOTE.   02/04/2021 Cancer Staging   Staging form: Colon and Rectum, AJCC 8th Edition - Clinical stage from 02/04/2021: Stage IVB (cTX, cN2, cM1b) - Signed by Truitt Merle, MD on 02/12/2021 Stage prefix: Initial diagnosis    02/08/2021 Imaging   CT CHEST, ABDOMEN AND PELVIS W CONTRAST  IMPRESSION: 1. There is a large, concentric mass of the rectosigmoid junction measuring 7.8 x 5.9 x 5.2 cm. Findings are consistent with primary colon malignancy identified by colonoscopy. 2. Multiple enlarged perirectal, bilateral pelvic sidewall, and retroperitoneal lymph nodes, consistent with nodal metastatic disease. 3. There are multiple small  low-attenuation lesions of the liver parenchyma, incompletely characterized although suspicious for hepatic metastatic disease. PET-CT or multiphasic contrast enhanced MRI could be used to more clearly characterize. 4. There are multiple small bilateral pulmonary nodules, measuring 3 mm and smaller, nonspecific although highly suspicious for early pulmonary metastatic disease given overall constellation of findings.   02/12/2021 Initial Diagnosis   Malignant neoplasm of rectosigmoid junction ([place])   03/04/2021 PET scan   IMPRESSION: 1. Intense hypermetabolic activity associated with rectal mass consistent with primary colorectal carcinoma. 2. Local extension of primary tumor into the LEFT perirectal fat. 3. Hypermetabolic LEFT operator node consistent with local nodal metastasis. 4. Hypermetabolic metastasis to the LEFT and RIGHT hepatic lobe (two lesions). 5. Hypermetabolic periportal lymph node. 6. Small pulmonary nodules without radiotracer activity. Recommend attention on follow-up.   03/13/2021 -  Chemotherapy   Patient is on Treatment Plan : COLORECTAL Xelox (Capeox) q21d      Genetic Testing   Negative genetic testing. No pathogenic variants identified on the [facility] Multi-Cancer+RNA Panel. 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 The report date is 04/01/2021.  The Multi-Cancer Panel + RNA offered by [facility] includes sequencing and/or deletion duplication testing of the following 84 genes: AIP, ALK, APC, ATM, AXIN2,BAP1,  BARD1, BLM, BMPR1A, BRCA1, BRCA2, BRIP1, CASR, CDC73, CDH1, CDK4, CDKN1B, CDKN1C, CDKN2A (p14ARF), CDKN2A (p16INK4a), CEBPA, CHEK2, CTNNA1, DICER1, DIS3L2, EGFR (c.2369C>T, p.Thr790Met variant only), EPCAM (Deletion/duplication testing only), FH, FLCN, GATA2, GPC3, GREM1 (Promoter region deletion/duplication testing only), HOXB13 (c.251G>A, p.Gly84Glu), HRAS, KIT, MAX, MEN1, MET, MITF (c.952G>A,  p.Glu318Lys variant only), MLH1, MSH2, MSH3, MSH6, MUTYH, NBN, NF1,  NF2, NTHL1, PALB2, PDGFRA, PHOX2B, PMS2, POLD1, POLE, POT1, PRKAR1A, PTCH1, PTEN, RAD50, RAD51C, RAD51D, RB1, RECQL4, RET, RUNX1, SDHAF2, SDHA (sequence changes only), SDHB, SDHC, SDHD, SMAD4, SMARCA4, SMARCB1, SMARCE1, STK11, SUFU, TERC, TERT, TMEM127, TP53, TSC1, TSC2, VHL, WRN and WT1.   05/18/2021 Imaging   CT AP  IMPRESSION: 1. 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 2. Wall thickening of the ascending colon, cecum and ileum worrisome for enterocolitis. Findings may be infectious, inflammatory or ischemic given findings in the aorta. No pneumatosis or free air. 3. Small amount of ascites. 4. Rectosigmoid mass has decreased in size. Left pelvic lymph node has decreased in size. 5. Previously identified liver lesions not well characterized on this study secondary to motion artifact.   09/12/2021 Imaging   EXAM: CT CHEST, ABDOMEN, AND PELVIS WITH CONTRAST  IMPRESSION: 1. Some previously seen tiny bilateral pulmonary nodules are resolved and not apparent on this examination, most consistent with treatment response of small pulmonary metastases. These were too small to demonstrate PET avidity at the time of initial staging. 2. Small hypodense liver lesions are significantly diminished in size compared to prior examination dated 02/08/2021, comparison to examination dated 05/18/2021 very limited by breath motion artifact. Several of these lesions were previously PET avid. Findings are consistent with treatment response of hepatic metastatic disease. 3. Interval increase in size of a posterior pelvic sidewall or perirectal lymph node when compared to prior dated 05/18/2021, previously PET avid and consistent with worsened nodal metastatic disease. 4. Additional prominent retroperitoneal lymph nodes are unchanged, these nodes not previously PET avid. Attention on follow-up. 5. Unchanged residual concentric wall thickening of the rectosigmoid  colon, consistent with treated primary malignancy. 6. There is a somewhat coarse, nodular contour of the liver, this appearance increased compared to prior examinations and perhaps reflecting pseudocirrhosis in the setting of treated hepatic metastases. 7. New splenomegaly, of uncertain significance. The portal and splenic veins are patent. 8. Coronary artery disease.      INTERVAL HISTORY:  Angela Mcpherson is here for a follow up of metastatic colon cancer. She was last seen by me on 08/26/21. She presents to the clinic accompanied by her husband.   She is doing well overall  Has mild rectal discomfort with BM, which resolves after BM She is doing more at home, is able to tolerates more activities  Eats well, gained more weight  No abdominal pain or bloating    All other systems were reviewed with the patient and are negative.  MEDICAL HISTORY:  Past Medical History:  Diagnosis Date   Colorectal cancer ([place])    Family history of brain cancer    Family history of prostate cancer    Family history of stomach cancer    Heart murmur    dx at 26   MVP is stable   Hemorrhoids     SURGICAL HISTORY: Past Surgical History:  Procedure Laterality Date   NO PAST SURGERIES      I have reviewed the social history and family history with the patient and they are unchanged from previous note.  ALLERGIES:  is allergic to codeine, epinephrine (anaphylaxis), orange fruit [citrus], streptogramins, and tomato.  MEDICATIONS:  Current Outpatient Medications  Medication Sig Dispense Refill   acetaminophen (TYLENOL) 325 MG tablet Take 650 mg by mouth every 6 (six) hours as needed for moderate pain.     apixaban (ELIQUIS) 5 MG  TABS tablet Take 1 tablet (5 mg total) by mouth 2 (two) times daily. 60 tablet 2   capecitabine (XELODA) 500 MG tablet TAKE 3 TABLETS BY MOUTH TWICE  DAILY (12 HOURS APART) FOR 14  DAYS ON THEN 7 DAYS OFF 84 tablet 1   cholecalciferol (VITAMIN D3) 25 MCG (1000 UNIT) tablet  Take 1,000 Units by mouth daily.     loperamide (IMODIUM) 2 MG capsule Take 1 capsule (2 mg total) by mouth as needed for diarrhea or loose stools. 30 capsule 0   ondansetron (ZOFRAN) 8 MG tablet Take 1 tablet (8 mg total) by mouth 2 (two) times daily as needed for refractory nausea / vomiting. Start on day 3 after chemotherapy. 30 tablet 1   VALERIAN ROOT PO Take 1 tablet by mouth daily.     No current facility-administered medications for this visit.    PHYSICAL EXAMINATION: ECOG PERFORMANCE STATUS: 1 - Symptomatic but completely ambulatory  Vitals:   09/16/21 1309  BP: 101/70  Pulse: 87  Resp: 18  Temp: 97.8 F (36.6 C)  SpO2: 100%   Wt Readings from Last 3 Encounters:  09/16/21 117 lb 1.6 oz (53.1 kg)  08/26/21 114 lb 3.2 oz (51.8 kg)  08/05/21 110 lb 11.2 oz (50.2 kg)     GENERAL:alert, no distress and comfortable SKIN: skin color, texture, turgor are normal, no rashes or significant lesions EYES: normal, Conjunctiva are pink and non-injected, sclera clear NECK: supple, thyroid normal size, non-tender, without nodularity LYMPH:  no palpable lymphadenopathy in the cervical, axillary  LUNGS: clear to auscultation and percussion with normal breathing effort HEART: regular rate & rhythm and no murmurs and no lower extremity edema ABDOMEN:abdomen soft, non-tender and normal bowel sounds Musculoskeletal:no cyanosis of digits and no clubbing  NEURO: alert & oriented x 3 with fluent speech, no focal motor/sensory deficits  LABORATORY DATA:  I have reviewed the data as listed CBC Latest Ref Rng & Units 09/16/2021 08/26/2021 08/05/2021  WBC 4.0 - 10.5 K/uL 4.7 4.7 4.3  Hemoglobin 12.0 - 15.0 g/dL 9.4(L) 9.7(L) 9.6(L)  Hematocrit 36.0 - 46.0 % 27.7(L) 29.3(L) 29.7(L)  Platelets 150 - 400 K/uL 113(L) 103(L) 109(L)     CMP Latest Ref Rng & Units 09/16/2021 08/26/2021 08/05/2021  Glucose 70 - 99 mg/dL 93 98 98  BUN 6 - 20 mg/dL _0 Creatinine 0.44 - 1.00 mg/dL 0.66 0.65 0.70   Sodium 135 - 145 mmol/L 139 139 139  Potassium 3.5 - 5.1 mmol/L 3.9 4.7 3.8  Chloride 98 - 111 mmol/L 107 107 106  CO2 22 - 32 mmol/L _1 Calcium 8.9 - 10.3 mg/dL 9.5 9.7 8.9  Total Protein 6.5 - 8.1 g/dL 6.5 6.7 6.8  Total Bilirubin 0.3 - 1.2 mg/dL 1.4(H) 0.9 0.7  Alkaline Phos 38 - 126 U/L 105 117 113  AST 15 - 41 U/L 34 39 40  ALT 0 - 44 U/L _2 RADIOGRAPHIC STUDIES: I have personally reviewed the radiological images as listed and agreed with the findings in the report. No results found.    No orders of the defined types were placed in this encounter.  All questions were answered. The patient knows to call the clinic with any problems, questions or concerns. No barriers to learning was detected. The total time spent in the appointment was 30 minutes.     Truitt Merle, MD 09/16/2021   I, Wilburn Mylar, am acting as scribe for  Truitt Merle, MD.   I have reviewed the above documentation for accuracy and completeness, and I agree with the above.

## 2021-09-16 NOTE — Patient Instructions (Signed)
[facility]ICAL [facility]COLOGY  ° Discharge Instructions: °Thank you for choosing [facility] to provide your oncology and hematology care.  ° °If you have a lab appointment with the Cancer Center, please go directly to the Cancer Center and check in at the registration area. °  °Wear comfortable clothing and clothing appropriate for easy access to any Portacath or PICC line.  ° °We strive to give you quality time with your provider. You may need to reschedule your appointment if you arrive late (15 or more minutes).  Arriving late affects you and other patients whose appointments are after yours.  Also, if you miss three or more appointments without notifying the office, you may be dismissed from the clinic at the provider’s discretion.    °  °For prescription refill requests, have your pharmacy contact our office and allow 72 hours for refills to be completed.   ° °Today you received the following chemotherapy and/or immunotherapy agents: Bevacizumab (Avastin)    °  °To help prevent nausea and vomiting after your treatment, we encourage you to take your nausea medication as directed. ° °BELOW ARE SYMPTOMS THAT SHOULD BE REPORTED IMMEDIATELY: °*FEVER GREATER THAN 100.4 F (38 °C) OR HIGHER °*CHILLS OR SWEATING °*NAUSEA AND VOMITING THAT IS NOT CONTROLLED WITH YOUR NAUSEA MEDICATION °*UNUSUAL SHORTNESS OF BREATH °*UNUSUAL BRUISING OR BLEEDING °*URINARY PROBLEMS (pain or burning when urinating, or frequent urination) °*BOWEL PROBLEMS (unusual diarrhea, constipation, pain near the anus) °TENDERNESS IN MOUTH AND THROAT WITH OR WITHOUT PRESENCE OF ULCERS (sore throat, sores in mouth, or a toothache) °UNUSUAL RASH, SWELLING OR PAIN  °UNUSUAL VAGINAL DISCHARGE OR ITCHING  ° °Items with * indicate a potential emergency and should be followed up as soon as possible or go to the Emergency Department if any problems should occur. ° °Please show the CHEMOTHERAPY ALERT CARD or IMMUNOTHERAPY ALERT CARD  at check-in to the Emergency Department and triage nurse. ° °Should you have questions after your visit or need to cancel or reschedule your appointment, please contact [facility]COL[facility]  Dept: [phone]  and follow the prompts.  Office hours are 8:00 a.m. to 4:30 p.m. Monday - Friday. Please note that voicemails left after 4:00 p.m. may not be returned until the following business day.  We are closed weekends and major holidays. You have access to a nurse at all times for urgent questions. Please call the main number to the clinic Dept: [phone] and follow the prompts. ° ° °For any non-urgent questions, you may also contact your provider using MyChart. We now offer e-Visits for anyone 18 and older to request care online for non-urgent symptoms. For details visit [email or web address]. °  °Also download the MyChart app! Go to the app store, search "MyChart", open the app, select [facility], and log in with your MyChart username and password. ° °Due to Covid, a mask is required upon entering the hospital/clinic. If you do not have a mask, one will be given to you upon arrival. For doctor visits, patients may have 1 support person aged 18 or older with them. For treatment visits, patients cannot have anyone with them due to current Covid guidelines and our immunocompromised population.  ° °

## 2021-09-17 ENCOUNTER — Encounter: Payer: Self-pay | Admitting: Hematology

## 2021-09-18 ENCOUNTER — Telehealth: Payer: Self-pay | Admitting: Hematology

## 2021-09-18 NOTE — Telephone Encounter (Signed)
Scheduled follow-up appointments per 1/30 los. Patient's husband is aware.

## 2021-10-04 ENCOUNTER — Inpatient Hospital Stay: Payer: 59 | Attending: Hematology

## 2021-10-04 ENCOUNTER — Other Ambulatory Visit: Payer: Self-pay

## 2021-10-04 DIAGNOSIS — K7460 Unspecified cirrhosis of liver: Secondary | ICD-10-CM

## 2021-10-04 DIAGNOSIS — C787 Secondary malignant neoplasm of liver and intrahepatic bile duct: Secondary | ICD-10-CM | POA: Diagnosis not present

## 2021-10-04 DIAGNOSIS — B1920 Unspecified viral hepatitis C without hepatic coma: Secondary | ICD-10-CM | POA: Insufficient documentation

## 2021-10-04 DIAGNOSIS — Z5112 Encounter for antineoplastic immunotherapy: Secondary | ICD-10-CM | POA: Diagnosis not present

## 2021-10-04 DIAGNOSIS — R188 Other ascites: Secondary | ICD-10-CM | POA: Diagnosis not present

## 2021-10-04 DIAGNOSIS — C19 Malignant neoplasm of rectosigmoid junction: Secondary | ICD-10-CM | POA: Diagnosis present

## 2021-10-04 DIAGNOSIS — Z86718 Personal history of other venous thrombosis and embolism: Secondary | ICD-10-CM | POA: Insufficient documentation

## 2021-10-04 DIAGNOSIS — C7800 Secondary malignant neoplasm of unspecified lung: Secondary | ICD-10-CM | POA: Insufficient documentation

## 2021-10-04 LAB — CBC WITH DIFFERENTIAL (CANCER CENTER ONLY)
Abs Immature Granulocytes: 0.01 10*3/uL (ref 0.00–0.07)
Basophils Absolute: 0 10*3/uL (ref 0.0–0.1)
Basophils Relative: 1 %
Eosinophils Absolute: 0.2 10*3/uL (ref 0.0–0.5)
Eosinophils Relative: 3 %
HCT: 27.4 % — ABNORMAL LOW (ref 36.0–46.0)
Hemoglobin: 9.1 g/dL — ABNORMAL LOW (ref 12.0–15.0)
Immature Granulocytes: 0 %
Lymphocytes Relative: 34 %
Lymphs Abs: 1.5 10*3/uL (ref 0.7–4.0)
MCH: 31.6 pg (ref 26.0–34.0)
MCHC: 33.2 g/dL (ref 30.0–36.0)
MCV: 95.1 fL (ref 80.0–100.0)
Monocytes Absolute: 0.5 10*3/uL (ref 0.1–1.0)
Monocytes Relative: 12 %
Neutro Abs: 2.2 10*3/uL (ref 1.7–7.7)
Neutrophils Relative %: 50 %
Platelet Count: 113 10*3/uL — ABNORMAL LOW (ref 150–400)
RBC: 2.88 MIL/uL — ABNORMAL LOW (ref 3.87–5.11)
RDW: 25.4 % — ABNORMAL HIGH (ref 11.5–15.5)
WBC Count: 4.4 10*3/uL (ref 4.0–10.5)
nRBC: 0 % (ref 0.0–0.2)

## 2021-10-04 LAB — CEA (IN HOUSE-CHCC): CEA (CHCC-In House): 14.48 ng/mL — ABNORMAL HIGH (ref 0.00–5.00)

## 2021-10-04 LAB — CMP (CANCER CENTER ONLY)
ALT: 25 U/L (ref 0–44)
AST: 30 U/L (ref 15–41)
Albumin: 3.9 g/dL (ref 3.5–5.0)
Alkaline Phosphatase: 104 U/L (ref 38–126)
Anion gap: 6 (ref 5–15)
BUN: 11 mg/dL (ref 6–20)
CO2: 27 mmol/L (ref 22–32)
Calcium: 9.3 mg/dL (ref 8.9–10.3)
Chloride: 106 mmol/L (ref 98–111)
Creatinine: 0.59 mg/dL (ref 0.44–1.00)
GFR, Estimated: >60 mL/min (ref >60)
Glucose, Bld: 94 mg/dL (ref 70–99)
Potassium: 3.4 mmol/L — ABNORMAL LOW (ref 3.5–5.1)
Sodium: 139 mmol/L (ref 135–145)
Total Bilirubin: 1.3 mg/dL — ABNORMAL HIGH (ref 0.3–1.2)
Total Protein: 6.5 g/dL (ref 6.5–8.1)

## 2021-10-04 LAB — HEPATITIS C ANTIBODY: HCV Ab: REACTIVE — AB

## 2021-10-04 LAB — TOTAL PROTEIN, URINE DIPSTICK: Protein, ur: NEGATIVE mg/dL

## 2021-10-04 MED FILL — Dexamethasone Sodium Phosphate Inj 100 MG/10ML: INTRAMUSCULAR | Qty: 1 | Status: AC

## 2021-10-07 ENCOUNTER — Other Ambulatory Visit: Payer: Self-pay | Admitting: Hematology

## 2021-10-07 ENCOUNTER — Encounter: Payer: Self-pay | Admitting: Hematology

## 2021-10-07 ENCOUNTER — Inpatient Hospital Stay (HOSPITAL_BASED_OUTPATIENT_CLINIC_OR_DEPARTMENT_OTHER): Payer: 59 | Admitting: Hematology

## 2021-10-07 ENCOUNTER — Other Ambulatory Visit: Payer: Self-pay

## 2021-10-07 ENCOUNTER — Inpatient Hospital Stay: Payer: 59

## 2021-10-07 VITALS — BP 125/74 | HR 80 | Temp 98.1°F | Resp 16 | Wt 113.2 lb

## 2021-10-07 DIAGNOSIS — Z5112 Encounter for antineoplastic immunotherapy: Secondary | ICD-10-CM | POA: Diagnosis not present

## 2021-10-07 DIAGNOSIS — B182 Chronic viral hepatitis C: Secondary | ICD-10-CM | POA: Diagnosis not present

## 2021-10-07 DIAGNOSIS — C19 Malignant neoplasm of rectosigmoid junction: Secondary | ICD-10-CM | POA: Diagnosis not present

## 2021-10-07 DIAGNOSIS — B1920 Unspecified viral hepatitis C without hepatic coma: Secondary | ICD-10-CM | POA: Insufficient documentation

## 2021-10-07 MED ORDER — SODIUM CHLORIDE 0.9 % IV SOLN
7.5000 mg/kg | Freq: Once | INTRAVENOUS | Status: AC
  Administered 2021-10-07: 400 mg via INTRAVENOUS
  Filled 2021-10-07: qty 16

## 2021-10-07 MED ORDER — SODIUM CHLORIDE 0.9 % IV SOLN: Freq: Once | INTRAVENOUS | Status: AC

## 2021-10-07 NOTE — Patient Instructions (Signed)
[facility]COL[facility]  ° Discharge Instructions: °Thank you for choosing [facility] to provide your oncology and hematology care.  ° °If you have a lab appointment with the Cancer Center, please go directly to the Cancer Center and check in at the registration area. °  °Wear comfortable clothing and clothing appropriate for easy access to any Portacath or PICC line.  ° °We strive to give you quality time with your provider. You may need to reschedule your appointment if you arrive late (15 or more minutes).  Arriving late affects you and other patients whose appointments are after yours.  Also, if you miss three or more appointments without notifying the office, you may be dismissed from the clinic at the provider’s discretion.    °  °For prescription refill requests, have your pharmacy contact our office and allow 72 hours for refills to be completed.   ° °Today you received the following chemotherapy and/or immunotherapy agents: Bevacizumab (Avastin)    °  °To help prevent nausea and vomiting after your treatment, we encourage you to take your nausea medication as directed. ° °BELOW ARE SYMPTOMS THAT SHOULD BE REPORTED IMMEDIATELY: °*FEVER GREATER THAN 100.4 F (38 °C) OR HIGHER °*CHILLS OR SWEATING °*NAUSEA AND VOMITING THAT IS NOT CONTROLLED WITH YOUR NAUSEA MEDICATION °*UNUSUAL SHORTNESS OF BREATH °*UNUSUAL BRUISING OR BLEEDING °*URINARY PROBLEMS (pain or burning when urinating, or frequent urination) °*BOWEL PROBLEMS (unusual diarrhea, constipation, pain near the anus) °TENDERNESS IN MOUTH AND THROAT WITH OR WITHOUT PRESENCE OF ULCERS (sore throat, sores in mouth, or a toothache) °UNUSUAL RASH, SWELLING OR PAIN  °UNUSUAL VAGINAL DISCHARGE OR ITCHING  ° °Items with * indicate a potential emergency and should be followed up as soon as possible or go to the Emergency Department if any problems should occur. ° °Please show the CHEMOTHERAPY ALERT CARD or IMMUNOTHERAPY ALERT CARD  at check-in to the Emergency Department and triage nurse. ° °Should you have questions after your visit or need to cancel or reschedule your appointment, please contact [facility]  Dept: [phone]  and follow the prompts.  Office hours are 8:00 a.m. to 4:30 p.m. Monday - Friday. Please note that voicemails left after 4:00 p.m. may not be returned until the following business day.  We are closed weekends and major holidays. You have access to a nurse at all times for urgent questions. Please call the main number to the clinic Dept: [phone] and follow the prompts. ° ° °For any non-urgent questions, you may also contact your provider using MyChart. We now offer e-Visits for anyone 18 and older to request care online for non-urgent symptoms. For details visit [email or web address]. °  °Also download the MyChart app! Go to the app store, search "MyChart", open the app, select , and log in with your MyChart username and password. ° °Due to Covid, a mask is required upon entering the hospital/clinic. If you do not have a mask, one will be given to you upon arrival. For doctor visits, patients may have 1 support person aged 18 or older with them. For treatment visits, patients cannot have anyone with them due to current Covid guidelines and our immunocompromised population.  ° °

## 2021-10-07 NOTE — Progress Notes (Addendum)
[place]   Telephone:([phone]) [phone] [phone]   Clinic Follow up Note   Patient Care Team: Truitt Merle, MD as PCP - General (Hematology) Truitt Merle, MD as Consulting Physician (Oncology) Milus Banister, MD as Attending Physician (Gastroenterology) Kyung Rudd, MD as Consulting Physician (Radiation Oncology)  Date of Service:  10/07/2021  CHIEF COMPLAINT: f/u of metastatic colon cancer  CURRENT THERAPY:  Xeloda and bevacizumab, started 03/13/21.  -Xeloda held 9/26-11/14/22, Beva held 04/25/21-09/15/21 due to hospitalization.  ASSESSMENT & PLAN:  Angela Mcpherson is a 61 y.o. female with   1. 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 She developed hematochezia and went to urgent care 05/2020. She met Dr. Ardis Hughs and underwent colonoscopy 02/04/21 showing rectosigmoid mass, with path confirming adenocarcinoma. -staging CT CAP 02/08/21 showed metastasis to lymph nodes and liver. PET on 03/04/21 confirmed hypermetabolism to rectal mass with extension into left perirectal fat, operator and periportal nodes, and two liver lesions. -FO showed MSS, low tumor mutation burden, and KRAS G12D mutation (+); baseline CEA 1,177.27 -she began Capox and bevacizumab on 03/13/21, is tolerating chemo well  -tumor marker CEA dropped significantly after starting chemo, indicating good response to treatment.  -she was hospitalized 05/18/21 and chemo was held. She restarted Xeloda on 07/02/21. We have slowly increased her dose, currently 1500 mg BID.  -restaging CT CAP 09/12/21 showed mixed response, but overall stable disease.  -we restarted bevacizumab on 09/16/21 -Lab reviewed, adequate for treatment, will proceed bevacizumab infusion today.  She will continue Xeloda 1563m twice daily for 14 days, then off 7 days. -will repeat CT scan in early April for close f/u     2. Hepatitis C; Mild liver cirrhosis -CT CAP on 09/12/21 showed increase in moderately coarse nodular contour of the liver, concerning for early liver cirrhosis,  and splenomegaly.  -Her husband had a hepatitis C and was treated 20 years ago. Due to no prior testing, patient was tested 10/04/21 and was positive. -I reviewed the hep C diagnosis with them today; her husband is familiar with it and [facility]  [facility]  3. history of thrombosis and new ascites  -admitted 05/18/21 with acute encephalopathy, colitis, abdominal aorta mural thrombosis, and thrombocytopenia. -she is on Eliquis and will continue  -she had paracentesis done on 06/27/21 for abdominal swelling/bloating; cytology was negative -she reports her bloating is much less now. She endorses taking Gas-X and Pepcid.   4. Goal of care discussion -We previously discussed the incurable nature of her cancer, and the overall poor prognosis, especially if she does not have good response to chemotherapy or progress on chemo -The patient understands the goal of care is palliative. -she previously agreed with DNR, but changed to full code later on.      Plan -proceed with Beva today and continue every 3 weeks -continue Xeloda 15053mq12hr, day 1-14 every 21 days -lab, f/u, and beva in 3 weeks -[facility]per her [facility])    No problem-specific Assessment & Plan notes found for this encounter.   SUMMARY OF ONCOLOGIC HISTORY: Oncology History Overview Note  Cancer Staging Malignant ne[facility] of [facility]osigmoid junction [facility]: Colon and Rectum, AJCC 8th Edition - Clinical stage from 02/04/2021: Stage IVB (cTX, cN2, cM1b) - Signed by FeTruitt MerleMD on 02/12/2021 Stage prefix: Initial diagnosis    Malignant [place]igmoid [place] 02/04/20[place] Procedure  Colonoscopy by Dr Ardis Hughs 02/04/21 IMPRESSION - Two 2 to 6 mm polyps in the sigmoid colon and in the cecum, removed with a cold  snare. Resected and retrieved. - A fungating, infiltrative and ulcerated partially obstructing mass was found in the rectosigmoid colon (distal edge about 7cm from the anus). The mass was partially circumferential (involving one-half of the lumen circumference). The mass measured eight cm in length. This was biopsied with a cold forceps for histology. The distal edge of the mass was tattooed with two submucosal injections of Spot (carbon black). Sigmoid colon felt somewhat fixed in place (large CRC or perhaps invasion into colon from other primary site?) - Internal hemorrhoids. - The examination was otherwise normal on direct and retroflexion views.   02/04/2021 Pathology Results   Diagnosis 1. 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 - NO HIGH-GRADE DYSPLASIA OR CARCINOMA. 2. Colon, biopsy, rectosigmoid - INVASIVE ADENOCARCINOMA. - SEE NOTE.   02/04/2021 Cancer Staging   Staging form: Colon and Rectum, AJCC 8th Edition - Clinical stage from 02/04/2021: Stage IVB (cTX, cN2, cM1b) - Signed by Truitt Merle, MD on 02/12/2021 Stage prefix: Initial diagnosis    02/08/2021 Imaging   CT CHEST, ABDOMEN AND PELVIS W CONTRAST  IMPRESSION: 1. There is a large, concentric mass of the rectosigmoid junction measuring 7.8 x 5.9 x 5.2 cm. Findings are consistent with primary colon malignancy identified by colonoscopy. 2. Multiple enlarged perirectal, bilateral pelvic sidewall, and retroperitoneal lymph nodes, consistent with nodal metastatic disease. 3. There are multiple small low-attenuation lesions of the liver parenchyma, incompletely characterized although suspicious for hepatic metastatic disease. PET-CT or multiphasic contrast enhanced MRI could be used to more clearly characterize. 4. There are multiple small bilateral pulmonary nodules, measuring 3 mm and smaller, nonspecific although highly suspicious for early pulmonary metastatic disease given overall constellation  of findings.   02/12/2021 Initial Diagnosis   Malignant neoplasm of rectosigmoid junction ([place])   03/04/2021 PET scan   IMPRESSION: 1. Intense hypermetabolic activity associated with rectal mass consistent with primary colorectal carcinoma. 2. Local extension of primary tumor into the LEFT perirectal fat. 3. Hypermetabolic LEFT operator node consistent with local nodal metastasis. 4. Hypermetabolic metastasis to the LEFT and RIGHT hepatic lobe (two lesions). 5. Hypermetabolic periportal lymph node. 6. Small pulmonary nodules without radiotracer activity. Recommend attention on follow-up.   03/13/2021 -  Chemotherapy   Patient is on Treatment Plan : COLORECTAL Xelox (Capeox) q21d      Genetic Testing   Negative genetic testing. No pathogenic variants identified on the [facility] Multi-Cancer+RNA Panel. 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 The report date is 04/01/2021.  The Multi-Cancer Panel + RNA offered by [facility] includes sequencing and/or deletion duplication testing of the following 84 genes: AIP, ALK, APC, ATM, AXIN2,BAP1,  BARD1, BLM, BMPR1A, BRCA1, BRCA2, BRIP1, CASR, CDC73, CDH1, CDK4, CDKN1B, CDKN1C, CDKN2A (p14ARF), CDKN2A (p16INK4a), CEBPA, CHEK2, CTNNA1, DICER1, DIS3L2, EGFR (c.2369C>T, p.Thr790Met variant only), EPCAM (Deletion/duplication testing only), FH, FLCN, GATA2, GPC3, GREM1 (Promoter region deletion/duplication testing only), HOXB13 (c.251G>A, p.Gly84Glu), HRAS, KIT, MAX, MEN1, MET, MITF (c.952G>A, p.Glu318Lys variant only), MLH1, MSH2, MSH3, MSH6, MUTYH, NBN, NF1, NF2, NTHL1, PALB2, PDGFRA, PHOX2B, PMS2, POLD1, POLE, POT1, PRKAR1A, PTCH1, PTEN, RAD50, RAD51C, RAD51D, RB1, RECQL4, RET, RUNX1, SDHAF2, SDHA (sequence changes only), SDHB, SDHC, SDHD, SMAD4, SMARCA4, SMARCB1, SMARCE1, STK11, SUFU, TERC, TERT, TMEM127, TP53, TSC1, TSC2, VHL, WRN and WT1.   05/18/2021 Imaging   CT AP  IMPRESSION: 1. There is new noncalcified thrombus in the  infrarenal abdominal aorta  causing severe stenosis. 2. Wall thickening of the ascending colon, cecum and ileum worrisome for enterocolitis. Findings may be infectious, inflammatory or ischemic given findings in the aorta. No pneumatosis or free air. 3. Small amount of ascites. 4. Rectosigmoid mass has decreased in size. Left pelvic lymph node has decreased in size. 5. Previously identified liver lesions not well characterized on this study secondary to motion artifact.   09/12/2021 Imaging   EXAM: CT CHEST, ABDOMEN, AND PELVIS WITH CONTRAST  IMPRESSION: 1. Some previously seen tiny bilateral pulmonary nodules are resolved and not apparent on this examination, most consistent with treatment response of small pulmonary metastases. These were too small to demonstrate PET avidity at the time of initial staging. 2. Small hypodense liver lesions are significantly diminished in size compared to prior examination dated 02/08/2021, comparison to examination dated 05/18/2021 very limited by breath motion artifact. Several of these lesions were previously PET avid. Findings are consistent with treatment response of hepatic metastatic disease. 3. Interval increase in size of a posterior pelvic sidewall or perirectal lymph node when compared to prior dated 05/18/2021, previously PET avid and consistent with worsened nodal metastatic disease. 4. Additional prominent retroperitoneal lymph nodes are unchanged, these nodes not previously PET avid. Attention on follow-up. 5. Unchanged residual concentric wall thickening of the rectosigmoid colon, consistent with treated primary malignancy. 6. There is a somewhat coarse, nodular contour of the liver, this appearance increased compared to prior examinations and perhaps reflecting pseudocirrhosis in the setting of treated hepatic metastases. 7. New splenomegaly, of uncertain significance. The portal and splenic veins are patent. 8. Coronary artery disease.      INTERVAL HISTORY:  Angela Mcpherson is here for a follow up of metastatic colon cancer. She was last seen by me on 09/16/21. She presents to the clinic accompanied by her husband. She reports she is doing well overall, improving from prior chemo. She reports she tolerated restarting Beva well.   All other systems were reviewed with the patient and are negative.  MEDICAL HISTORY:  Past Medical History:  Diagnosis Date   Colorectal cancer ([place])    Family history of brain cancer    Family history of prostate cancer    Family history of stomach cancer    Heart murmur    dx at 26   MVP is stable   Hemorrhoids     SURGICAL HISTORY: Past Surgical History:  Procedure Laterality Date   NO PAST SURGERIES      I have reviewed the social history and family history with the patient and they are unchanged from previous note.  ALLERGIES:  is allergic to codeine, epinephrine (anaphylaxis), orange fruit [citrus], streptogramins, and tomato.  MEDICATIONS:  Current Outpatient Medications  Medication Sig Dispense Refill   acetaminophen (TYLENOL) 325 MG tablet Take 650 mg by mouth every 6 (six) hours as needed for moderate pain.     apixaban (ELIQUIS) 5 MG TABS tablet Take 1 tablet (5 mg total) by mouth 2 (two) times daily. 60 tablet 2   capecitabine (XELODA) 500 MG tablet TAKE 3 TABLETS BY MOUTH TWICE  DAILY (12 HOURS APART) FOR 14  DAYS ON THEN 7 DAYS OFF 84 tablet 1   cholecalciferol (VITAMIN D3) 25 MCG (1000 UNIT) tablet Take 1,000 Units by mouth daily.     loperamide (IMODIUM) 2 MG capsule Take 1 capsule (2 mg total) by mouth as needed for diarrhea or loose stools. 30 capsule 0   ondansetron (ZOFRAN) 8 MG  tablet Take 1 tablet (8 mg total) by mouth 2 (two) times daily as needed for refractory nausea / vomiting. Start on day 3 after chemotherapy. 30 tablet 1   VALERIAN ROOT PO Take 1 tablet by mouth daily.     No current facility-administered medications for this visit.    PHYSICAL EXAMINATION: ECOG PERFORMANCE STATUS: 1 -  Symptomatic but completely ambulatory  There were no vitals filed for this visit. Wt Readings from Last 3 Encounters:  10/07/21 113 lb 4 oz (51.4 kg)  09/16/21 117 lb 1.6 oz (53.1 kg)  08/26/21 114 lb 3.2 oz (51.8 kg)     GENERAL:alert, no distress and comfortable SKIN: skin color normal, no rashes or significant lesions EYES: normal, Conjunctiva are pink and non-injected, sclera clear  NEURO: alert & oriented x 3 with fluent speech  LABORATORY DATA:  I have reviewed the data as listed CBC Latest Ref Rng & Units 10/04/2021 09/16/2021 08/26/2021  WBC 4.0 - 10.5 K/uL 4.4 4.7 4.7  Hemoglobin 12.0 - 15.0 g/dL 9.1(L) 9.4(L) 9.7(L)  Hematocrit 36.0 - 46.0 % 27.4(L) 27.7(L) 29.3(L)  Platelets 150 - 400 K/uL 113(L) 113(L) 103(L)     CMP Latest Ref Rng & Units 10/04/2021 09/16/2021 08/26/2021  Glucose 70 - 99 mg/dL 94 93 98  BUN 6 - 20 mg/dL 11 11 9   Creatinine 0.44 - 1.00 mg/dL 0.59 0.66 0.65  Sodium 135 - 145 mmol/L 139 139 139  Potassium 3.5 - 5.1 mmol/L 3.4(L) 3.9 4.7  Chloride 98 - 111 mmol/L 106 107 107  CO2 22 - 32 mmol/L 27 25 27   Calcium 8.9 - 10.3 mg/dL 9.3 9.5 9.7  Total Protein 6.5 - 8.1 g/dL 6.5 6.5 6.7  Total Bilirubin 0.3 - 1.2 mg/dL 1.3(H) 1.4(H) 0.9  Alkaline Phos 38 - 126 U/L 104 105 117  AST 15 - 41 U/L 30 34 39  ALT 0 - 44 U/L 25 25 29       RADIOGRAPHIC STUDIES: I have personally reviewed the radiological images as listed and agreed with the findings in the report. No results found.    Orders Placed This Encounter  Procedures   Ambulatory referral to Gastroenterology    Referral Priority:   Routine    Referral Type:   Consultation    Referral Reason:   Specialty Services Required    Number of Visits Requested:   1   All questions were answered. The patient knows to call the clinic with any problems, questions or concerns. No barriers to learning was detected. The total time spent in the appointment was 30 minutes.     Truitt Merle, MD 10/07/2021   I, Wilburn Mylar, am acting as scribe for Truitt Merle, MD.   I have reviewed the above documentation for accuracy and completeness, and I agree with the above.

## 2021-10-10 ENCOUNTER — Telehealth: Payer: Self-pay | Admitting: Hematology

## 2021-10-10 NOTE — Telephone Encounter (Signed)
Left message with follow-up appointment per 2/20 los.

## 2021-10-13 ENCOUNTER — Other Ambulatory Visit: Payer: Self-pay | Admitting: Hematology

## 2021-10-15 ENCOUNTER — Other Ambulatory Visit (HOSPITAL_COMMUNITY): Payer: Self-pay

## 2021-10-28 ENCOUNTER — Inpatient Hospital Stay: Payer: 59 | Attending: Hematology

## 2021-10-28 ENCOUNTER — Inpatient Hospital Stay (HOSPITAL_BASED_OUTPATIENT_CLINIC_OR_DEPARTMENT_OTHER): Payer: 59 | Admitting: Hematology

## 2021-10-28 ENCOUNTER — Other Ambulatory Visit: Payer: Self-pay

## 2021-10-28 ENCOUNTER — Encounter: Payer: Self-pay | Admitting: Hematology

## 2021-10-28 ENCOUNTER — Inpatient Hospital Stay: Payer: 59

## 2021-10-28 VITALS — BP 128/79 | HR 96 | Temp 98.2°F | Resp 18 | Ht 64.0 in | Wt 115.2 lb

## 2021-10-28 DIAGNOSIS — C19 Malignant neoplasm of rectosigmoid junction: Secondary | ICD-10-CM

## 2021-10-28 DIAGNOSIS — C787 Secondary malignant neoplasm of liver and intrahepatic bile duct: Secondary | ICD-10-CM | POA: Insufficient documentation

## 2021-10-28 DIAGNOSIS — Z7901 Long term (current) use of anticoagulants: Secondary | ICD-10-CM | POA: Diagnosis not present

## 2021-10-28 DIAGNOSIS — Z86718 Personal history of other venous thrombosis and embolism: Secondary | ICD-10-CM | POA: Insufficient documentation

## 2021-10-28 DIAGNOSIS — Z5112 Encounter for antineoplastic immunotherapy: Secondary | ICD-10-CM | POA: Insufficient documentation

## 2021-10-28 DIAGNOSIS — R188 Other ascites: Secondary | ICD-10-CM | POA: Insufficient documentation

## 2021-10-28 LAB — CMP (CANCER CENTER ONLY)
ALT: 21 U/L (ref 0–44)
AST: 27 U/L (ref 15–41)
Albumin: 3.7 g/dL (ref 3.5–5.0)
Alkaline Phosphatase: 101 U/L (ref 38–126)
Anion gap: 6 (ref 5–15)
BUN: 10 mg/dL (ref 6–20)
CO2: 26 mmol/L (ref 22–32)
Calcium: 9.8 mg/dL (ref 8.9–10.3)
Chloride: 106 mmol/L (ref 98–111)
Creatinine: 0.59 mg/dL (ref 0.44–1.00)
GFR, Estimated: >60 mL/min (ref >60)
Glucose, Bld: 112 mg/dL — ABNORMAL HIGH (ref 70–99)
Potassium: 4.2 mmol/L (ref 3.5–5.1)
Sodium: 138 mmol/L (ref 135–145)
Total Bilirubin: 1.2 mg/dL (ref 0.3–1.2)
Total Protein: 6.4 g/dL — ABNORMAL LOW (ref 6.5–8.1)

## 2021-10-28 LAB — CBC WITH DIFFERENTIAL (CANCER CENTER ONLY)
Abs Immature Granulocytes: 0.01 10*3/uL (ref 0.00–0.07)
Basophils Absolute: 0 10*3/uL (ref 0.0–0.1)
Basophils Relative: 0 %
Eosinophils Absolute: 0.2 10*3/uL (ref 0.0–0.5)
Eosinophils Relative: 3 %
HCT: 32.2 % — ABNORMAL LOW (ref 36.0–46.0)
Hemoglobin: 10.6 g/dL — ABNORMAL LOW (ref 12.0–15.0)
Immature Granulocytes: 0 %
Lymphocytes Relative: 26 %
Lymphs Abs: 1.5 10*3/uL (ref 0.7–4.0)
MCH: 32.2 pg (ref 26.0–34.0)
MCHC: 32.9 g/dL (ref 30.0–36.0)
MCV: 97.9 fL (ref 80.0–100.0)
Monocytes Absolute: 0.8 10*3/uL (ref 0.1–1.0)
Monocytes Relative: 15 %
Neutro Abs: 3.2 10*3/uL (ref 1.7–7.7)
Neutrophils Relative %: 56 %
Platelet Count: 127 10*3/uL — ABNORMAL LOW (ref 150–400)
RBC: 3.29 MIL/uL — ABNORMAL LOW (ref 3.87–5.11)
RDW: 25.6 % — ABNORMAL HIGH (ref 11.5–15.5)
WBC Count: 5.7 10*3/uL (ref 4.0–10.5)
nRBC: 0 % (ref 0.0–0.2)

## 2021-10-28 LAB — CEA (IN HOUSE-CHCC): CEA (CHCC-In House): 23.39 ng/mL — ABNORMAL HIGH (ref 0.00–5.00)

## 2021-10-28 LAB — TOTAL PROTEIN, URINE DIPSTICK: Protein, ur: NEGATIVE mg/dL

## 2021-10-28 MED ORDER — SODIUM CHLORIDE 0.9 % IV SOLN
7.5000 mg/kg | Freq: Once | INTRAVENOUS | Status: AC
  Administered 2021-10-28: 400 mg via INTRAVENOUS
  Filled 2021-10-28: qty 16

## 2021-10-28 MED ORDER — DEXTROSE 5 % IV SOLN: Freq: Once | INTRAVENOUS | Status: AC

## 2021-10-28 MED ORDER — CAPECITABINE 500 MG PO TABS: ORAL_TABLET | ORAL | 1 refills | Status: DC

## 2021-10-28 NOTE — Patient Instructions (Signed)
[facility]COL[facility]  Discharge Instructions: °Thank you for choosing [facility] to provide your oncology and hematology care.  ° °If you have a lab appointment with the Cancer Center, please go directly to the Cancer Center and check in at the registration area. °  °Wear comfortable clothing and clothing appropriate for easy access to any Portacath or PICC line.  ° °We strive to give you quality time with your provider. You may need to reschedule your appointment if you arrive late (15 or more minutes).  Arriving late affects you and other patients whose appointments are after yours.  Also, if you miss three or more appointments without notifying the office, you may be dismissed from the clinic at the provider’s discretion.    °  °For prescription refill requests, have your pharmacy contact our office and allow 72 hours for refills to be completed.   ° °Today you received the following chemotherapy and/or immunotherapy agents: Bevacizumab.     °  °To help prevent nausea and vomiting after your treatment, we encourage you to take your nausea medication as directed. ° °BELOW ARE SYMPTOMS THAT SHOULD BE REPORTED IMMEDIATELY: °*FEVER GREATER THAN 100.4 F (38 °C) OR HIGHER °*CHILLS OR SWEATING °*NAUSEA AND VOMITING THAT IS NOT CONTROLLED WITH YOUR NAUSEA MEDICATION °*UNUSUAL SHORTNESS OF BREATH °*UNUSUAL BRUISING OR BLEEDING °*URINARY PROBLEMS (pain or burning when urinating, or frequent urination) °*BOWEL PROBLEMS (unusual diarrhea, constipation, pain near the anus) °TENDERNESS IN MOUTH AND THROAT WITH OR WITHOUT PRESENCE OF ULCERS (sore throat, sores in mouth, or a toothache) °UNUSUAL RASH, SWELLING OR PAIN  °UNUSUAL VAGINAL DISCHARGE OR ITCHING  ° °Items with * indicate a potential emergency and should be followed up as soon as possible or go to the Emergency Department if any problems should occur. ° °Please show the CHEMOTHERAPY ALERT CARD or IMMUNOTHERAPY ALERT CARD at check-in  to the Emergency Department and triage nurse. ° °Should you have questions after your visit or need to cancel or reschedule your appointment, please contact [facility]  Dept: [phone]  and follow the prompts.  Office hours are 8:00 a.m. to 4:30 p.m. Monday - Friday. Please note that voicemails left after 4:00 p.m. may not be returned until the following business day.  We are closed weekends and major holidays. You have access to a nurse at all times for urgent questions. Please call the main number to the clinic Dept: [phone] and follow the prompts. ° ° °For any non-urgent questions, you may also contact your provider using MyChart. We now offer e-Visits for anyone 18 and older to request care online for non-urgent symptoms. For details visit [email or web address]. °  °Also download the MyChart app! Go to the app store, search "MyChart", open the app, select [place], and log in with your MyChart username and password. ° °Due to Covid, a mask is required upon entering the hospital/clinic. If you do not have a mask, one will be given to you upon arrival. For doctor visits, patients may have 1 support person aged 18 or older with them. For treatment visits, patients cannot have anyone with them due to current Covid guidelines and our immunocompromised population.  ° °

## 2021-10-28 NOTE — Progress Notes (Signed)
[place]   Telephone:(336) [phone] Fax:([phone]   Clinic Follow up Note   Patient Care Team: Truitt Merle, MD as PCP - General (Hematology) Truitt Merle, MD as Consulting Physician (Oncology) Milus Banister, MD as Attending Physician (Gastroenterology) Kyung Rudd, MD as Consulting Physician (Radiation Oncology)  Date of Service:  10/28/2021  CHIEF COMPLAINT: f/u of metastatic colon cancer  CURRENT THERAPY:  Xeloda and bevacizumab, started 03/13/21.  -Xeloda held 9/26-11/14/22, Beva held 04/25/21-09/15/21 due to hospitalization.  ASSESSMENT & PLAN:  Angela Mcpherson is a 61 y.o. female with   1. 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 She developed hematochezia and went to urgent care 05/2020. She met Dr. Ardis Hughs and underwent colonoscopy 02/04/21 showing rectosigmoid mass, with path confirming adenocarcinoma. -staging CT CAP 02/08/21 showed metastasis to lymph nodes and liver. PET on 03/04/21 confirmed hypermetabolism to rectal mass with extension into left perirectal fat, operator and periportal nodes, and two liver lesions. -FO showed MSS, low tumor mutation burden, and KRAS G12D mutation (+); baseline CEA 1,177.27 -she began Capox and bevacizumab on 03/13/21, is tolerating chemo well  -tumor marker CEA dropped significantly after starting chemo, indicating good response to treatment.  -she was hospitalized 05/18/21 and chemo was held. She restarted Xeloda on 07/02/21. We have slowly increased her dose, currently 1500 mg BID.  -restaging CT CAP 09/12/21 showed mixed response, but overall stable disease.  -we restarted bevacizumab on 09/16/21. She is tolerating treatment well overall. -Lab reviewed, adequate for treatment, will proceed bevacizumab infusion today.  She will continue Xeloda 1580m twice daily for 14 days, then off 7 days. She has  mild skin peeling, overall tolerating well  -will repeat CT scan in early April for close f/u    2. Hepatitis C; Mild liver cirrhosis -CT CAP on 09/12/21 showed increase in moderately coarse nodular contour of the liver, concerning for early liver cirrhosis,  and splenomegaly.  -Her husband had a hepatitis C and was treated 20 years ago. Due to no prior testing, patient was tested 10/04/21 and was positive.   3. history of thrombosis and new ascites  -admitted 05/18/21 with acute encephalopathy, colitis, abdominal aorta mural thrombosis, and thrombocytopenia. -she is on Eliquis and will continue  -she had paracentesis done on 06/27/21 for abdominal swelling/bloating; cytology was negative -she reports her bloating is much less now. She endorses taking Gas-X and Pepcid.   4. Goal of care discussion -We previously discussed the incurable nature of her cancer, and the overall poor prognosis, especially if she does not have good response to chemotherapy or progress on chemo -The patient understands the goal of care is palliative. -she previously agreed with DNR, but changed to full code later on.      Plan -proceed with Beva today and continue every 3 weeks -continue Xeloda 15058mq12hr, day 1-14 every 21 days -f/u and beva in 3 weeks, with lab and CT a few days before   No problem-specific Assessment & Plan notes found for this encounter.   SUMMARY OF ONCOLOGIC HISTORY: Oncology History Overview Note  Cancer Staging [facility]ignant [facility]igmoid [facility]: Colon and Rectum, AJCC 8th Edition - Clinical stage from 02/04/2021: Stage IVB (cTX, cN2, cM1b) - Signed by FeTruitt MerleMD on 02/12/2021 Stage prefix: Initial diagnosis    [place] 02/[place]/20[place] Procedure   Colonoscopy by Dr JaArdis Hughs/20/22  IMPRESSION - Two 2 to 6 mm polyps in the sigmoid colon and in the cecum, removed with a cold snare. Resected and retrieved. - A  fungating, infiltrative and ulcerated partially obstructing mass was found in the rectosigmoid colon (distal edge about 7cm from the anus). The mass was partially circumferential (involving one-half of the lumen circumference). The mass measured eight cm in length. This was biopsied with a cold forceps for histology. The distal edge of the mass was tattooed with two submucosal injections of Spot (carbon black). Sigmoid colon felt somewhat fixed in place (large CRC or perhaps invasion into colon from other primary site?) - Internal hemorrhoids. - The examination was otherwise normal on direct and retroflexion views.   02/04/2021 Pathology Results   Diagnosis 1. 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 - NO HIGH-GRADE DYSPLASIA OR CARCINOMA. 2. Colon, biopsy, rectosigmoid - INVASIVE ADENOCARCINOMA. - SEE NOTE.   02/04/2021 Cancer Staging   Staging form: Colon and Rectum, AJCC 8th Edition - Clinical stage from 02/04/2021: Stage IVB (cTX, cN2, cM1b) - Signed by Truitt Merle, MD on 02/12/2021 Stage prefix: Initial diagnosis    02/08/2021 Imaging   CT CHEST, ABDOMEN AND PELVIS W CONTRAST  IMPRESSION: 1. There is a large, concentric mass of the rectosigmoid junction measuring 7.8 x 5.9 x 5.2 cm. Findings are consistent with primary colon malignancy identified by colonoscopy. 2. Multiple enlarged perirectal, bilateral pelvic sidewall, and retroperitoneal lymph nodes, consistent with nodal metastatic disease. 3. There are multiple small low-attenuation lesions of the liver parenchyma, incompletely characterized although suspicious for hepatic metastatic disease. PET-CT or multiphasic contrast enhanced MRI could be used to more clearly characterize. 4. There are multiple small bilateral pulmonary nodules, measuring 3 mm and smaller, nonspecific although highly suspicious for early pulmonary metastatic disease given overall constellation of findings.   02/12/2021 Initial  Diagnosis   Malignant neoplasm of rectosigmoid junction ([place])   03/04/2021 PET scan   IMPRESSION: 1. Intense hypermetabolic activity associated with rectal mass consistent with primary colorectal carcinoma. 2. Local extension of primary tumor into the LEFT perirectal fat. 3. Hypermetabolic LEFT operator node consistent with local nodal metastasis. 4. Hypermetabolic metastasis to the LEFT and RIGHT hepatic lobe (two lesions). 5. Hypermetabolic periportal lymph node. 6. Small pulmonary nodules without radiotracer activity. Recommend attention on follow-up.   03/13/2021 -  Chemotherapy   Patient is on Treatment Plan : COLORECTAL Xelox (Capeox) q21d      Genetic Testing   Negative genetic testing. No pathogenic variants identified on the [facility] Multi-Cancer+RNA Panel. 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 The report date is 04/01/2021.  The Multi-Cancer Panel + RNA offered by [facility] includes sequencing and/or deletion duplication testing of the following 84 genes: AIP, ALK, APC, ATM, AXIN2,BAP1,  BARD1, BLM, BMPR1A, BRCA1, BRCA2, BRIP1, CASR, CDC73, CDH1, CDK4, CDKN1B, CDKN1C, CDKN2A (p14ARF), CDKN2A (p16INK4a), CEBPA, CHEK2, CTNNA1, DICER1, DIS3L2, EGFR (c.2369C>T, p.Thr790Met variant only), EPCAM (Deletion/duplication testing only), FH, FLCN, GATA2, GPC3, GREM1 (Promoter region deletion/duplication testing only), HOXB13 (c.251G>A, p.Gly84Glu), HRAS, KIT, MAX, MEN1, MET, MITF (c.952G>A, p.Glu318Lys variant only), MLH1, MSH2, MSH3, MSH6, MUTYH, NBN, NF1, NF2, NTHL1, PALB2, PDGFRA, PHOX2B, PMS2, POLD1, POLE, POT1, PRKAR1A, PTCH1, PTEN, RAD50, RAD51C, RAD51D, RB1, RECQL4, RET, RUNX1, SDHAF2, SDHA (sequence changes only), SDHB, SDHC, SDHD, SMAD4, SMARCA4, SMARCB1, SMARCE1, STK11, SUFU, TERC, TERT, TMEM127, TP53, TSC1, TSC2, VHL, WRN and WT1.   05/18/2021 Imaging   CT AP  IMPRESSION: 1. There is new noncalcified thrombus in the infrarenal abdominal aorta causing severe  stenosis. 2. Wall  thickening of the ascending colon, cecum and ileum worrisome for enterocolitis. Findings may be infectious, inflammatory or ischemic given findings in the aorta. No pneumatosis or free air. 3. Small amount of ascites. 4. Rectosigmoid mass has decreased in size. Left pelvic lymph node has decreased in size. 5. Previously identified liver lesions not well characterized on this study secondary to motion artifact.   09/12/2021 Imaging   EXAM: CT CHEST, ABDOMEN, AND PELVIS WITH CONTRAST  IMPRESSION: 1. Some previously seen tiny bilateral pulmonary nodules are resolved and not apparent on this examination, most consistent with treatment response of small pulmonary metastases. These were too small to demonstrate PET avidity at the time of initial staging. 2. Small hypodense liver lesions are significantly diminished in size compared to prior examination dated 02/08/2021, comparison to examination dated 05/18/2021 very limited by breath motion artifact. Several of these lesions were previously PET avid. Findings are consistent with treatment response of hepatic metastatic disease. 3. Interval increase in size of a posterior pelvic sidewall or perirectal lymph node when compared to prior dated 05/18/2021, previously PET avid and consistent with worsened nodal metastatic disease. 4. Additional prominent retroperitoneal lymph nodes are unchanged, these nodes not previously PET avid. Attention on follow-up. 5. Unchanged residual concentric wall thickening of the rectosigmoid colon, consistent with treated primary malignancy. 6. There is a somewhat coarse, nodular contour of the liver, this appearance increased compared to prior examinations and perhaps reflecting pseudocirrhosis in the setting of treated hepatic metastases. 7. New splenomegaly, of uncertain significance. The portal and splenic veins are patent. 8. Coronary artery disease.      INTERVAL HISTORY:  Angela Mcpherson is here for a follow up of  metastatic colon cancer. She was last seen by me on 10/07/21. She presents to the clinic accompanied by her husband. She reports has some bloating feeling but no fluid build up. She also reports skin peeling, some skin cracking. She notes her allergies are starting and asked about taking Claritin.   All other systems were reviewed with the patient and are negative.  MEDICAL HISTORY:  Past Medical History:  Diagnosis Date   Colorectal cancer ([place])    Family history of brain cancer    Family history of prostate cancer    Family history of stomach cancer    Heart murmur    dx at 26   MVP is stable   Hemorrhoids     SURGICAL HISTORY: Past Surgical History:  Procedure Laterality Date   NO PAST SURGERIES      I have reviewed the social history and family history with the patient and they are unchanged from previous note.  ALLERGIES:  is allergic to codeine, epinephrine (anaphylaxis), orange fruit [citrus], streptogramins, and tomato.  MEDICATIONS:  Current Outpatient Medications  Medication Sig Dispense Refill   acetaminophen (TYLENOL) 325 MG tablet Take 650 mg by mouth every 6 (six) hours as needed for moderate pain.     apixaban (ELIQUIS) 5 MG TABS tablet Take 1 tablet (5 mg total) by mouth 2 (two) times daily. 60 tablet 2   capecitabine (XELODA) 500 MG tablet TAKE 3 TABLETS BY MOUTH TWICE  DAILY (12 HOURS APART) FOR 14  DAYS ON THEN 7 DAYS OFF 84 tablet 1   cholecalciferol (VITAMIN D3) 25 MCG (1000 UNIT) tablet Take 1,000 Units by mouth daily.     loperamide (IMODIUM) 2 MG capsule Take 1 capsule (2 mg total) by mouth as needed for diarrhea or loose stools. 30 capsule  0   ondansetron (ZOFRAN) 8 MG tablet Take 1 tablet (8 mg total) by mouth 2 (two) times daily as needed for refractory nausea / vomiting. Start on day 3 after chemotherapy. 30 tablet 1   VALERIAN ROOT PO Take 1 tablet by mouth daily.     No current facility-administered medications for this visit.    PHYSICAL  EXAMINATION: ECOG PERFORMANCE STATUS: 1 - Symptomatic but completely ambulatory  Vitals:   10/28/21 1414  BP: 128/79  Pulse: 96  Resp: 18  Temp: 98.2 F (36.8 C)  SpO2: 100%   Wt Readings from Last 3 Encounters:  10/28/21 115 lb 3.2 oz (52.3 kg)  10/07/21 113 lb 4 oz (51.4 kg)  09/16/21 117 lb 1.6 oz (53.1 kg)     GENERAL:alert, no distress and comfortable SKIN: skin color normal, no rashes or significant lesions EYES: normal, Conjunctiva are pink and non-injected, sclera clear  NEURO: alert & oriented x 3 with fluent speech  LABORATORY DATA:  I have reviewed the data as listed CBC Latest Ref Rng & Units 10/28/2021 10/04/2021 09/16/2021  WBC 4.0 - 10.5 K/uL 5.7 4.4 4.7  Hemoglobin 12.0 - 15.0 g/dL 10.6(L) 9.1(L) 9.4(L)  Hematocrit 36.0 - 46.0 % 32.2(L) 27.4(L) 27.7(L)  Platelets 150 - 400 K/uL 127(L) 113(L) 113(L)     CMP Latest Ref Rng & Units 10/28/2021 10/04/2021 09/16/2021  Glucose 70 - 99 mg/dL 112(H) 94 93  BUN 6 - 20 mg/dL 10 11 11   Creatinine 0.44 - 1.00 mg/dL 0.59 0.59 0.66  Sodium 135 - 145 mmol/L 138 139 139  Potassium 3.5 - 5.1 mmol/L 4.2 3.4(L) 3.9  Chloride 98 - 111 mmol/L 106 106 107  CO2 22 - 32 mmol/L 26 27 25   Calcium 8.9 - 10.3 mg/dL 9.8 9.3 9.5  Total Protein 6.5 - 8.1 g/dL 6.4(L) 6.5 6.5  Total Bilirubin 0.3 - 1.2 mg/dL 1.2 1.3(H) 1.4(H)  Alkaline Phos 38 - 126 U/L 101 104 105  AST 15 - 41 U/L 27 30 34  ALT 0 - 44 U/L 21 25 25       RADIOGRAPHIC STUDIES: I have personally reviewed the radiological images as listed and agreed with the findings in the report. No results found.    Orders Placed This Encounter  Procedures   CT CHEST ABDOMEN PELVIS W CONTRAST    Standing Status:   Future    Standing Expiration Date:   10/29/2022    Order Specific Question:   Is patient pregnant?    Answer:   No    Order Specific Question:   Preferred imaging location?    Answer:   [facility]    Order Specific Question:   Release to patient    Answer:    Immediate    Order Specific Question:   Is Oral Contrast requested for this exam?    Answer:   Yes, Per Radiology protocol   All questions were answered. The patient knows to call the clinic with any problems, questions or concerns. No barriers to learning was detected. The total time spent in the appointment was 30 minutes.     Truitt Merle, MD 10/28/2021   I, Wilburn Mylar, am acting as scribe for Truitt Merle, MD.   I have reviewed the above documentation for accuracy and completeness, and I agree with the above.

## 2021-10-29 ENCOUNTER — Encounter: Payer: Self-pay | Admitting: Hematology

## 2021-10-29 ENCOUNTER — Telehealth: Payer: Self-pay | Admitting: Hematology

## 2021-10-29 NOTE — Telephone Encounter (Signed)
Scheduled follow-up appointments per 3/13 los. Patient is aware. ?

## 2021-10-29 NOTE — Addendum Note (Signed)
Addended by: Truitt Merle on: 10/29/2021 11:34 AM ? ? Modules accepted: Orders ? ?

## 2021-11-04 ENCOUNTER — Encounter: Payer: Self-pay | Admitting: Hematology

## 2021-11-16 NOTE — Progress Notes (Signed)
?[place]pher[place]   ?Telephone:(336) [phone] [phone]   ?Clinic Follow up Note  ? ?Patient Care Team: ?Truitt Merle, MD as PCP - General (Hematology) ?Truitt Merle, MD as Consulting Physician (Oncology) ?Milus Banister, MD as Attending Physician (Gastroenterology) ?Kyung Rudd, MD as Consulting Physician (Radiation Oncology) ?11/18/2021 ? ?CHIEF COMPLAINT: Follow-up metastatic colon cancer ? ?SUMMARY OF ONCOLOGIC HISTORY: ?Oncology History Overview Note  ?Cancer Staging ?Malignant neoplasm of rectosigmoid junction ([facility]) ?Staging form: Colon and Rectum, AJCC 8th Edition ?- Clinical stage from 02/04/2021: Stage IVB (cTX, cN2, cM1b) - Signed by Truitt Merle, MD on 02/12/2021 ?Stage prefix: Initial diagnosis ? ?  ?Malignant neoplasm of rectosigmoid junction ([place])  ?02/04/2021 Procedure  ? Colonoscopy by Dr Ardis Hughs 02/04/21 ?IMPRESSION ?- Two 2 to 6 mm polyps in the sigmoid colon and in the cecum, removed with a cold snare. ?Resected and retrieved. ?- A fungating, infiltrative and ulcerated partially obstructing mass was found in the rectosigmoid ?colon (distal edge about 7cm from the anus). The mass was partially circumferential ?(involving one-half of the lumen circumference). The mass measured eight cm in length. This ?was biopsied with a cold forceps for histology. The distal edge of the mass was tattooed with ?two submucosal injections of Spot (carbon black). Sigmoid colon felt somewhat fixed in place ?(large CRC or perhaps invasion into colon from other primary site?) ?- Internal hemorrhoids. ?- The examination was otherwise normal on direct and retroflexion views. ?  ?02/04/2021 Pathology Results  ? Diagnosis ?1. Cecum Biopsy, and sigmoid, polyp (s) 2 ?- TUBULAR ADENOMA (2). ?- NO HIGH-GRADE DYSPLASIA OR CARCINOMA. ?2. Colon, biopsy, rectosigmoid ?- INVASIVE ADENOCARCINOMA. ?- SEE NOTE. ?  ?02/04/2021 Cancer Staging  ? Staging form: Colon and Rectum, AJCC 8th Edition ?- Clinical stage from 02/04/2021: Stage  IVB (cTX, cN2, cM1b) - Signed by Truitt Merle, MD on 02/12/2021 ?Stage prefix: Initial diagnosis ? ?  ?02/08/2021 Imaging  ? CT CHEST, ABDOMEN AND PELVIS W CONTRAST  ?IMPRESSION: ?1. There is a large, concentric mass of the rectosigmoid junction ?measuring 7.8 x 5.9 x 5.2 cm. Findings are consistent with primary ?colon malignancy identified by colonoscopy. ?2. Multiple enlarged perirectal, bilateral pelvic sidewall, and ?retroperitoneal lymph nodes, consistent with nodal metastatic ?disease. ?3. There are multiple small low-attenuation lesions of the liver ?parenchyma, incompletely characterized although suspicious for ?hepatic metastatic disease. PET-CT or multiphasic contrast enhanced ?MRI could be used to more clearly characterize. ?4. There are multiple small bilateral pulmonary nodules, measuring 3 ?mm and smaller, nonspecific although highly suspicious for early ?pulmonary metastatic disease given overall constellation of ?findings. ?  ?02/12/2021 Initial Diagnosis  ? Malignant neoplasm of rectosigmoid junction (H[facility]) ?  ?03/04/2021 PET scan  ? IMPRESSION: ?1. Intense hypermetabolic activity associated with rectal mass ?consistent with primary colorectal carcinoma. ?2. Local extension of primary tumor into the LEFT perirectal fat. ?3. Hypermetabolic LEFT operator node consistent with local nodal ?metastasis. ?4. Hypermetabolic metastasis to the LEFT and RIGHT hepatic lobe (two lesions). ?5. Hypermetabolic periportal lymph node. ?6. Small pulmonary nodules without radiotracer activity. Recommend attention on follow-up. ?  ?03/13/2021 -  Chemotherapy  ? Patient is on Treatment Plan : COLORECTAL Bevacizumab q21d  ?   ? Genetic Testing  ? Negative genetic testing. No pathogenic variants identified on the [facility] Multi-Cancer+RNA Panel. 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 The report date is 04/01/2021. ? ?The Multi-Cancer Panel + RNA offered by [facility] includes sequencing and/or deletion duplication testing of the  following 84 genes: AIP, ALK, APC, ATM, AXIN2,BAP1,  BARD1, BLM, BMPR1A, BRCA1, BRCA2, BRIP1, CASR, CDC73, CDH1, CDK4, CDKN1B, CDKN1C, CDKN2A (p14ARF), CDKN2A (p16INK4a), CEBPA, CHEK2, CTNNA1, DICER1, DIS3L2, EGFR (c.2369C>T, p.Thr790Met variant only), EPCAM (Deletion/duplication testing only), FH, FLCN, GATA2, GPC3, GREM1 (Promoter region deletion/duplication testing only), HOXB13 (c.251G>A, p.Gly84Glu), HRAS, KIT, MAX, MEN1, MET, MITF (c.952G>A, p.Glu318Lys variant only), MLH1, MSH2, MSH3, MSH6, MUTYH, NBN, NF1, NF2, NTHL1, PALB2, PDGFRA, PHOX2B, PMS2, POLD1, POLE, POT1, PRKAR1A, PTCH1, PTEN, RAD50, RAD51C, RAD51D, RB1, RECQL4, RET, RUNX1, SDHAF2, SDHA (sequence changes only), SDHB, SDHC, SDHD, SMAD4, SMARCA4, SMARCB1, SMARCE1, STK11, SUFU, TERC, TERT, TMEM127, TP53, TSC1, TSC2, VHL, WRN and WT1. ?  ?05/18/2021 Imaging  ? CT AP ? ?IMPRESSION: ?1. There is new noncalcified thrombus in the infrarenal abdominal ?aorta causing severe stenosis. ?2. Wall thickening of the ascending colon, cecum and ileum worrisome for enterocolitis. Findings may be infectious, inflammatory or ischemic given findings in the aorta. No pneumatosis or free air. ?3. Small amount of ascites. ?4. Rectosigmoid mass has decreased in size. Left pelvic lymph node has decreased in size. ?5. Previously identified liver lesions not well characterized on ?this study secondary to motion artifact. ?  ?09/12/2021 Imaging  ? EXAM: ?CT CHEST, ABDOMEN, AND PELVIS WITH CONTRAST ? ?IMPRESSION: ?1. Some previously seen tiny bilateral pulmonary nodules are resolved and not apparent on this examination, most consistent with treatment response of small pulmonary metastases. These were too small to demonstrate PET avidity at the time of initial staging. ?2. Small hypodense liver lesions are significantly diminished in size compared to prior examination dated 02/08/2021, comparison to examination dated 05/18/2021 very limited by breath motion artifact. Several of  these lesions were previously PET avid. Findings are consistent with treatment response of hepatic metastatic disease. ?3. Interval increase in size of a posterior pelvic sidewall or perirectal lymph node when compared to prior dated 05/18/2021, previously PET avid and consistent with worsened nodal metastatic disease. ?4. Additional prominent retroperitoneal lymph nodes are unchanged, these nodes not previously PET avid. Attention on follow-up. ?5. Unchanged residual concentric wall thickening of the rectosigmoid colon, consistent with treated primary malignancy. ?6. There is a somewhat coarse, nodular contour of the liver, this appearance increased compared to prior examinations and perhaps reflecting pseudocirrhosis in the setting of treated hepatic metastases. ?7. New splenomegaly, of uncertain significance. The portal and splenic veins are patent. ?8. Coronary artery disease. ?  ? ? ?CURRENT THERAPY:  ?Xeloda and bevacizumab, started 03/13/21.  ?-Xeloda held 9/26-11/14/22, Beva held 04/25/21-09/15/21 due to hospitalization. ? ?INTERVAL HISTORY: Angela Mcpherson returns for follow-up and treatment as scheduled, last seen by Dr. Burr Medico 10/28/2021.  She took Xeloda 1500 mg twice daily for the first week then at 1000 mg twice daily for the second week, continues to have hand peeling and discomfort despite dose reduction.  However her feet are less painful.  She applies creams.  She can still function well, avoids hot water, wears gloves etc.  She has mostly soft stool on average twice daily with periodic diarrhea.  She manages well with Imodium.  For the past 2 weeks she has have a wet sounding cough, her husband was diagnosed with bronchitis and is on a Z-Pak.  She has been taking Robitussin and Mucinex without much relief.  She was more tired this past week, spent more time on the couch.  This is causing her to have a depressed mood. She is able to eat and drink.  Denies fever, chills, chest pain, dyspnea.  She has been off  Xeloda almost 1 week, due to  start next cycle 4/4. ? ? ?MEDICAL HISTORY:  ?Past Medical History:  ?Diagnosis Date  ? Colorectal cancer ([place])   ? Family history of brain cancer   ? Family history of prostate can

## 2021-11-18 ENCOUNTER — Inpatient Hospital Stay: Payer: 59 | Attending: Hematology | Admitting: Nurse Practitioner

## 2021-11-18 ENCOUNTER — Other Ambulatory Visit: Payer: Self-pay

## 2021-11-18 ENCOUNTER — Ambulatory Visit (HOSPITAL_COMMUNITY)
Admission: RE | Admit: 2021-11-18 | Discharge: 2021-11-18 | Disposition: A | Discharge status: Home / Self Care | Payer: 59 | Source: Ambulatory Visit | Attending: Nurse Practitioner | Admitting: Nurse Practitioner

## 2021-11-18 ENCOUNTER — Inpatient Hospital Stay: Payer: 59

## 2021-11-18 ENCOUNTER — Encounter: Payer: Self-pay | Admitting: Nurse Practitioner

## 2021-11-18 VITALS — BP 129/90 | HR 108 | Temp 98.7°F | Resp 16 | Wt 113.2 lb

## 2021-11-18 DIAGNOSIS — C19 Malignant neoplasm of rectosigmoid junction: Secondary | ICD-10-CM | POA: Insufficient documentation

## 2021-11-18 DIAGNOSIS — J069 Acute upper respiratory infection, unspecified: Secondary | ICD-10-CM | POA: Insufficient documentation

## 2021-11-18 DIAGNOSIS — B1920 Unspecified viral hepatitis C without hepatic coma: Secondary | ICD-10-CM | POA: Insufficient documentation

## 2021-11-18 DIAGNOSIS — Z5112 Encounter for antineoplastic immunotherapy: Secondary | ICD-10-CM | POA: Insufficient documentation

## 2021-11-18 DIAGNOSIS — C787 Secondary malignant neoplasm of liver and intrahepatic bile duct: Secondary | ICD-10-CM | POA: Insufficient documentation

## 2021-11-18 LAB — CBC WITH DIFFERENTIAL (CANCER CENTER ONLY)
Abs Immature Granulocytes: 0.01 10*3/uL (ref 0.00–0.07)
Basophils Absolute: 0 10*3/uL (ref 0.0–0.1)
Basophils Relative: 0 %
Eosinophils Absolute: 0.1 10*3/uL (ref 0.0–0.5)
Eosinophils Relative: 2 %
HCT: 33.5 % — ABNORMAL LOW (ref 36.0–46.0)
Hemoglobin: 10.9 g/dL — ABNORMAL LOW (ref 12.0–15.0)
Immature Granulocytes: 0 %
Lymphocytes Relative: 18 %
Lymphs Abs: 1.3 10*3/uL (ref 0.7–4.0)
MCH: 32.6 pg (ref 26.0–34.0)
MCHC: 32.5 g/dL (ref 30.0–36.0)
MCV: 100.3 fL — ABNORMAL HIGH (ref 80.0–100.0)
Monocytes Absolute: 0.9 10*3/uL (ref 0.1–1.0)
Monocytes Relative: 13 %
Neutro Abs: 4.9 10*3/uL (ref 1.7–7.7)
Neutrophils Relative %: 67 %
Platelet Count: 86 10*3/uL — ABNORMAL LOW (ref 150–400)
RBC: 3.34 MIL/uL — ABNORMAL LOW (ref 3.87–5.11)
RDW: 25.2 % — ABNORMAL HIGH (ref 11.5–15.5)
WBC Count: 7.3 10*3/uL (ref 4.0–10.5)
nRBC: 0 % (ref 0.0–0.2)

## 2021-11-18 LAB — CEA (IN HOUSE-CHCC): CEA (CHCC-In House): 18.17 ng/mL — ABNORMAL HIGH (ref 0.00–5.00)

## 2021-11-18 LAB — CMP (CANCER CENTER ONLY)
ALT: 16 U/L (ref 0–44)
AST: 21 U/L (ref 15–41)
Albumin: 3.5 g/dL (ref 3.5–5.0)
Alkaline Phosphatase: 91 U/L (ref 38–126)
Anion gap: 8 (ref 5–15)
BUN: 8 mg/dL (ref 6–20)
CO2: 24 mmol/L (ref 22–32)
Calcium: 9.1 mg/dL (ref 8.9–10.3)
Chloride: 107 mmol/L (ref 98–111)
Creatinine: 0.49 mg/dL (ref 0.44–1.00)
GFR, Estimated: >60 mL/min (ref >60)
Glucose, Bld: 98 mg/dL (ref 70–99)
Potassium: 3.3 mmol/L — ABNORMAL LOW (ref 3.5–5.1)
Sodium: 139 mmol/L (ref 135–145)
Total Bilirubin: 1.7 mg/dL — ABNORMAL HIGH (ref 0.3–1.2)
Total Protein: 6.4 g/dL — ABNORMAL LOW (ref 6.5–8.1)

## 2021-11-18 LAB — TOTAL PROTEIN, URINE DIPSTICK: Protein, ur: NEGATIVE mg/dL

## 2021-11-18 MED ORDER — AMOXICILLIN-POT CLAVULANATE 875-125 MG PO TABS: 1.0000 | ORAL_TABLET | Freq: Two times a day (BID) | ORAL | 0 refills | Status: DC

## 2021-11-18 MED ORDER — POTASSIUM CHLORIDE CRYS ER 20 MEQ PO TBCR: 20.0000 meq | EXTENDED_RELEASE_TABLET | Freq: Two times a day (BID) | ORAL | 0 refills | Status: DC

## 2021-11-19 ENCOUNTER — Encounter: Payer: Self-pay | Admitting: Nurse Practitioner

## 2021-11-19 ENCOUNTER — Telehealth: Payer: Self-pay | Admitting: Hematology

## 2021-11-19 NOTE — Telephone Encounter (Signed)
Scheduled follow-up appointments per 4/3 los. Patient is aware. ?

## 2021-11-22 NOTE — Progress Notes (Signed)
[facility] ?OFFICE PROGRESS NOTE ? ?Truitt Merle, MD ?[address] ?[place] [address] ? ?DIAGNOSIS: Follow-up metastatic colon cancer ?  ? ?Oncology History Overview Note  ?Cancer Staging ?Malignant neoplasm of rectosigmoid junction (HCC) ?Staging form: Colon and Rectum, AJCC 8th Edition ?- Clinical stage from 02/04/2021: Stage IVB (cTX, cN2, cM1b) - Signed by Truitt Merle, MD on 02/12/2021 ?Stage prefix: Initial diagnosis ? ?  ?Malignant neoplasm of rectosigmoid junction ([place])  ?02/04/2021 Procedure  ? Colonoscopy by Dr Ardis Hughs 02/04/21 ?IMPRESSION ?- Two 2 to 6 mm polyps in the sigmoid colon and in the cecum, removed with a cold snare. ?Resected and retrieved. ?- A fungating, infiltrative and ulcerated partially obstructing mass was found in the rectosigmoid ?colon (distal edge about 7cm from the anus). The mass was partially circumferential ?(involving one-half of the lumen circumference). The mass measured eight cm in length. This ?was biopsied with a cold forceps for histology. The distal edge of the mass was tattooed with ?two submucosal injections of Spot (carbon black). Sigmoid colon felt somewhat fixed in place ?(large CRC or perhaps invasion into colon from other primary site?) ?- Internal hemorrhoids. ?- The examination was otherwise normal on direct and retroflexion views. ?  ?02/04/2021 Pathology Results  ? Diagnosis ?1. Cecum Biopsy, and sigmoid, polyp (s) 2 ?- TUBULAR ADENOMA (2). ?- NO HIGH-GRADE DYSPLASIA OR CARCINOMA. ?2. Colon, biopsy, rectosigmoid ?- INVASIVE ADENOCARCINOMA. ?- SEE NOTE. ?  ?02/04/2021 Cancer Staging  ? Staging form: Colon and Rectum, AJCC 8th Edition ?- Clinical stage from 02/04/2021: Stage IVB (cTX, cN2, cM1b) - Signed by Truitt Merle, MD on 02/12/2021 ?Stage prefix: Initial diagnosis ? ?  ?02/08/2021 Imaging  ? CT CHEST, ABDOMEN AND PELVIS W CONTRAST  ?IMPRESSION: ?1. There is a large, concentric mass of the rectosigmoid junction ?measuring 7.8 x 5.9 x 5.2 cm.  Findings are consistent with primary ?colon malignancy identified by colonoscopy. ?2. Multiple enlarged perirectal, bilateral pelvic sidewall, and ?retroperitoneal lymph nodes, consistent with nodal metastatic ?disease. ?3. There are multiple small low-attenuation lesions of the liver ?parenchyma, incompletely characterized although suspicious for ?hepatic metastatic disease. PET-CT or multiphasic contrast enhanced ?MRI could be used to more clearly characterize. ?4. There are multiple small bilateral pulmonary nodules, measuring 3 ?mm and smaller, nonspecific although highly suspicious for early ?pulmonary metastatic disease given overall constellation of ?findings. ?  ?02/12/2021 Initial Diagnosis  ? Malignant neoplasm of rectosigmoid junction (HCC) ?  ?03/04/2021 PET scan  ? IMPRESSION: ?1. Intense hypermetabolic activity associated with rectal mass ?consistent with primary colorectal carcinoma. ?2. Local extension of primary tumor into the LEFT perirectal fat. ?3. Hypermetabolic LEFT operator node consistent with local nodal ?metastasis. ?4. Hypermetabolic metastasis to the LEFT and RIGHT hepatic lobe (two lesions). ?5. Hypermetabolic periportal lymph node. ?6. Small pulmonary nodules without radiotracer activity. Recommend attention on follow-up. ?  ?03/13/2021 -  Chemotherapy  ? Patient is on Treatment Plan : COLORECTAL Bevacizumab q21d  ?   ? Genetic Testing  ? Negative genetic testing. No pathogenic variants identified on the [facility] Multi-Cancer+RNA Panel. 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 The report date is 04/01/2021. ? ?The Multi-Cancer Panel + RNA offered by [facility] includes sequencing and/or deletion duplication testing of the following 84 genes: AIP, ALK, APC, ATM, AXIN2,BAP1,  BARD1, BLM, BMPR1A, BRCA1, BRCA2, BRIP1, CASR, CDC73, CDH1, CDK4, CDKN1B, CDKN1C, CDKN2A (p14ARF), CDKN2A (p16INK4a), CEBPA, CHEK2, CTNNA1, DICER1, DIS3L2, EGFR (c.2369C>T, p.Thr790Met variant only), EPCAM  (Deletion/duplication testing only), FH, FLCN, GATA2, GPC3, GREM1 (Promoter region deletion/duplication  testing only), HOXB13 (c.251G>A, p.Gly84Glu), HRAS, KIT, MAX, MEN1, MET, MITF (c.952G>A, p.Glu318Lys variant only), MLH1, MSH2, MSH3, MSH6, MUTYH, NBN, NF1, NF2, NTHL1, PALB2, PDGFRA, PHOX2B, PMS2, POLD1, POLE, POT1, PRKAR1A, PTCH1, PTEN, RAD50, RAD51C, RAD51D, RB1, RECQL4, RET, RUNX1, SDHAF2, SDHA (sequence changes only), SDHB, SDHC, SDHD, SMAD4, SMARCA4, SMARCB1, SMARCE1, STK11, SUFU, TERC, TERT, TMEM127, TP53, TSC1, TSC2, VHL, WRN and WT1. ?  ?05/18/2021 Imaging  ? CT AP ? ?IMPRESSION: ?1. There is new noncalcified thrombus in the infrarenal abdominal ?aorta causing severe stenosis. ?2. Wall thickening of the ascending colon, cecum and ileum worrisome for enterocolitis. Findings may be infectious, inflammatory or ischemic given findings in the aorta. No pneumatosis or free air. ?3. Small amount of ascites. ?4. Rectosigmoid mass has decreased in size. Left pelvic lymph node has decreased in size. ?5. Previously identified liver lesions not well characterized on ?this study secondary to motion artifact. ?  ?09/12/2021 Imaging  ? EXAM: ?CT CHEST, ABDOMEN, AND PELVIS WITH CONTRAST ? ?IMPRESSION: ?1. Some previously seen tiny bilateral pulmonary nodules are resolved and not apparent on this examination, most consistent with treatment response of small pulmonary metastases. These were too small to demonstrate PET avidity at the time of initial staging. ?2. Small hypodense liver lesions are significantly diminished in size compared to prior examination dated 02/08/2021, comparison to examination dated 05/18/2021 very limited by breath motion artifact. Several of these lesions were previously PET avid. Findings are consistent with treatment response of hepatic metastatic disease. ?3. Interval increase in size of a posterior pelvic sidewall or perirectal lymph node when compared to prior dated 05/18/2021, previously PET  avid and consistent with worsened nodal metastatic disease. ?4. Additional prominent retroperitoneal lymph nodes are unchanged, these nodes not previously PET avid. Attention on follow-up. ?5. Unchanged residual concentric wall thickening of the rectosigmoid colon, consistent with treated primary malignancy. ?6. There is a somewhat coarse, nodular contour of the liver, this appearance increased compared to prior examinations and perhaps reflecting pseudocirrhosis in the setting of treated hepatic metastases. ?7. New splenomegaly, of uncertain significance. The portal and splenic veins are patent. ?8. Coronary artery disease. ?  ? ? ?CURRENT THERAPY: Xeloda and bevacizumab, started 03/13/21.  ?-Xeloda held 9/26-11/14/22, Beva held 04/25/21-09/15/21 due to hospitalization ? ?INTERVAL HISTORY: ?Angela Mcpherson 61 y.o. female returns to the clinic today for a follow-up visit accompanied by her husband..  She was last seen in clinic on 11/18/2021 by Regan Rakers, nurse practitioner.  She was supposed to undergo her next cycle of treatment but her treatment was delayed by 1 week due to having a URI and thrombocytopenia.  She had a chest x-ray performed which was negative for any acute infection.  She was given a prescription for Augmentin which improved her symptoms the following day.  She feels ready to resume today.  Because the extra week off improved her hand-foot syndrome symptoms, she has restarted her Xeloda at the normal dose of 1500 milligrams twice daily on days 1 through 14 every 3 weeks.  This cycle prior, the patient was taking 1500 mg in the morning and 1000 mg in the evening.  She reports she has improvement in her feet and she has some peeling still but she is able to walk barefoot without stinging and burning.  She also uses Epsom salt and cold water.  She also has some peeling in her hands but is able to function. She denies any fever, chills, or night sweats.  She reports a strong appetite and her weight is stable.  She denies any shortness of breath.  Her cough has resolved.  Denies any chest pain.  Denies any nausea, vomiting, or constipation.  She is here today for evaluation and repeat blood work before starting her next cycle of t

## 2021-11-25 ENCOUNTER — Other Ambulatory Visit: Payer: Self-pay | Admitting: Nurse Practitioner

## 2021-11-26 ENCOUNTER — Other Ambulatory Visit: Payer: Self-pay

## 2021-11-26 ENCOUNTER — Inpatient Hospital Stay: Payer: 59

## 2021-11-26 ENCOUNTER — Inpatient Hospital Stay: Payer: 59 | Admitting: Physician Assistant

## 2021-11-26 VITALS — BP 109/73 | HR 112 | Temp 97.5°F | Resp 18 | Ht 64.0 in | Wt 116.3 lb

## 2021-11-26 VITALS — HR 83

## 2021-11-26 DIAGNOSIS — C787 Secondary malignant neoplasm of liver and intrahepatic bile duct: Secondary | ICD-10-CM | POA: Insufficient documentation

## 2021-11-26 DIAGNOSIS — J069 Acute upper respiratory infection, unspecified: Secondary | ICD-10-CM | POA: Diagnosis not present

## 2021-11-26 DIAGNOSIS — Z5112 Encounter for antineoplastic immunotherapy: Secondary | ICD-10-CM | POA: Insufficient documentation

## 2021-11-26 DIAGNOSIS — D696 Thrombocytopenia, unspecified: Secondary | ICD-10-CM | POA: Diagnosis not present

## 2021-11-26 DIAGNOSIS — B1920 Unspecified viral hepatitis C without hepatic coma: Secondary | ICD-10-CM | POA: Insufficient documentation

## 2021-11-26 DIAGNOSIS — C19 Malignant neoplasm of rectosigmoid junction: Secondary | ICD-10-CM

## 2021-11-26 LAB — CMP (CANCER CENTER ONLY)
ALT: 17 U/L (ref 0–44)
AST: 26 U/L (ref 15–41)
Albumin: 3.3 g/dL — ABNORMAL LOW (ref 3.5–5.0)
Alkaline Phosphatase: 99 U/L (ref 38–126)
Anion gap: 6 (ref 5–15)
BUN: 8 mg/dL (ref 6–20)
CO2: 24 mmol/L (ref 22–32)
Calcium: 9.2 mg/dL (ref 8.9–10.3)
Chloride: 107 mmol/L (ref 98–111)
Creatinine: 0.51 mg/dL (ref 0.44–1.00)
GFR, Estimated: >60 mL/min (ref >60)
Glucose, Bld: 146 mg/dL — ABNORMAL HIGH (ref 70–99)
Potassium: 3.9 mmol/L (ref 3.5–5.1)
Sodium: 137 mmol/L (ref 135–145)
Total Bilirubin: 0.6 mg/dL (ref 0.3–1.2)
Total Protein: 6.4 g/dL — ABNORMAL LOW (ref 6.5–8.1)

## 2021-11-26 LAB — CBC WITH DIFFERENTIAL (CANCER CENTER ONLY)
Abs Immature Granulocytes: 0 10*3/uL (ref 0.00–0.07)
Basophils Absolute: 0 10*3/uL (ref 0.0–0.1)
Basophils Relative: 1 %
Eosinophils Absolute: 0.2 10*3/uL (ref 0.0–0.5)
Eosinophils Relative: 6 %
HCT: 30.7 % — ABNORMAL LOW (ref 36.0–46.0)
Hemoglobin: 10 g/dL — ABNORMAL LOW (ref 12.0–15.0)
Immature Granulocytes: 0 %
Lymphocytes Relative: 28 %
Lymphs Abs: 1.1 10*3/uL (ref 0.7–4.0)
MCH: 31.9 pg (ref 26.0–34.0)
MCHC: 32.6 g/dL (ref 30.0–36.0)
MCV: 98.1 fL (ref 80.0–100.0)
Monocytes Absolute: 0.6 10*3/uL (ref 0.1–1.0)
Monocytes Relative: 16 %
Neutro Abs: 1.9 10*3/uL (ref 1.7–7.7)
Neutrophils Relative %: 49 %
Platelet Count: 86 10*3/uL — ABNORMAL LOW (ref 150–400)
RBC: 3.13 MIL/uL — ABNORMAL LOW (ref 3.87–5.11)
RDW: 22.7 % — ABNORMAL HIGH (ref 11.5–15.5)
WBC Count: 3.7 10*3/uL — ABNORMAL LOW (ref 4.0–10.5)
nRBC: 0 % (ref 0.0–0.2)

## 2021-11-26 MED ORDER — DEXTROSE 5 % IV SOLN: Freq: Once | INTRAVENOUS | Status: AC

## 2021-11-26 MED ORDER — SODIUM CHLORIDE 0.9 % IV SOLN
7.5000 mg/kg | Freq: Once | INTRAVENOUS | Status: AC
  Administered 2021-11-26: 400 mg via INTRAVENOUS
  Filled 2021-11-26: qty 16

## 2021-11-26 NOTE — Patient Instructions (Signed)
[facility]  Discharge Instructions: Thank you for choosing [facility] to provide your oncology and hematology care.   If you have a lab appointment with the Cancer Center, please go directly to the Cancer Center and check in at the registration area.   Wear comfortable clothing and clothing appropriate for easy access to any Portacath or PICC line.   We strive to give you quality time with your provider. You may need to reschedule your appointment if you arrive late (15 or more minutes).  Arriving late affects you and other patients whose appointments are after yours.  Also, if you miss three or more appointments without notifying the office, you may be dismissed from the clinic at the provider's discretion.      For prescription refill requests, have your pharmacy contact our office and allow 72 hours for refills to be completed.    Today you received the following chemotherapy and/or immunotherapy agents bevacizumab-awwb   To help prevent nausea and vomiting after your treatment, we encourage you to take your nausea medication as directed.  BELOW ARE SYMPTOMS THAT SHOULD BE REPORTED IMMEDIATELY: . *FEVER GREATER THAN 100.4 F (38 C) OR HIGHER . *CHILLS OR SWEATING . *NAUSEA AND VOMITING THAT IS NOT CONTROLLED WITH YOUR NAUSEA MEDICATION . *UNUSUAL SHORTNESS OF BREATH . *UNUSUAL BRUISING OR BLEEDING . *URINARY PROBLEMS (pain or burning when urinating, or frequent urination) . *BOWEL PROBLEMS (unusual diarrhea, constipation, pain near the anus) . TENDERNESS IN MOUTH AND THROAT WITH OR WITHOUT PRESENCE OF ULCERS (sore throat, sores in mouth, or a toothache) . UNUSUAL RASH, SWELLING OR PAIN  . UNUSUAL VAGINAL DISCHARGE OR ITCHING   Items with * indicate a potential emergency and should be followed up as soon as possible or go to the Emergency Department if any problems should occur.  Please show the CHEMOTHERAPY ALERT CARD or IMMUNOTHERAPY  ALERT CARD at check-in to the Emergency Department and triage nurse.  Should you have questions after your visit or need to cancel or reschedule your appointment, please contact [facility]  Dept: [phone]  and follow the prompts.  Office hours are 8:00 a.m. to 4:30 p.m. Monday - Friday. Please note that voicemails left after 4:00 p.m. may not be returned until the following business day.  We are closed weekends and major holidays. You have access to a nurse at all times for urgent questions. Please call the main number to the clinic Dept: [phone] and follow the prompts.   For any non-urgent questions, you may also contact your provider using MyChart. We now offer e-Visits for anyone 18 and older to request care online for non-urgent symptoms. For details visit [email or web address].   Also download the MyChart app! Go to the app store, search "MyChart", open the app, select [facility], and log in with your MyChart username and password.  Due to Covid, a mask is required upon entering the hospital/clinic. If you do not have a mask, one will be given to you upon arrival. For doctor visits, patients may have 1 support person aged 18 or older with them. For treatment visits, patients cannot have anyone with them due to current Covid guidelines and our immunocompromised population.   

## 2021-11-26 NOTE — Progress Notes (Signed)
Ok to treat today with platelet count of 86 per Cassie H, PA-C. ?

## 2021-11-29 ENCOUNTER — Telehealth: Payer: Self-pay | Admitting: Hematology

## 2021-11-29 NOTE — Telephone Encounter (Signed)
Scheduled per 04/12 los, patient has been called and notified. ?

## 2021-12-09 ENCOUNTER — Inpatient Hospital Stay: Payer: 59

## 2021-12-09 ENCOUNTER — Ambulatory Visit (HOSPITAL_COMMUNITY)
Admission: RE | Admit: 2021-12-09 | Discharge: 2021-12-09 | Disposition: A | Discharge status: Home / Self Care | Payer: 59 | Source: Ambulatory Visit | Attending: Hematology | Admitting: Hematology

## 2021-12-09 DIAGNOSIS — Z5112 Encounter for antineoplastic immunotherapy: Secondary | ICD-10-CM | POA: Diagnosis not present

## 2021-12-09 DIAGNOSIS — C19 Malignant neoplasm of rectosigmoid junction: Secondary | ICD-10-CM | POA: Insufficient documentation

## 2021-12-09 LAB — CBC WITH DIFFERENTIAL (CANCER CENTER ONLY)
Abs Immature Granulocytes: 0 10*3/uL (ref 0.00–0.07)
Basophils Absolute: 0 10*3/uL (ref 0.0–0.1)
Basophils Relative: 1 %
Eosinophils Absolute: 0.1 10*3/uL (ref 0.0–0.5)
Eosinophils Relative: 2 %
HCT: 33.4 % — ABNORMAL LOW (ref 36.0–46.0)
Hemoglobin: 11.3 g/dL — ABNORMAL LOW (ref 12.0–15.0)
Immature Granulocytes: 0 %
Lymphocytes Relative: 28 %
Lymphs Abs: 1.4 10*3/uL (ref 0.7–4.0)
MCH: 32.8 pg (ref 26.0–34.0)
MCHC: 33.8 g/dL (ref 30.0–36.0)
MCV: 96.8 fL (ref 80.0–100.0)
Monocytes Absolute: 0.4 10*3/uL (ref 0.1–1.0)
Monocytes Relative: 8 %
Neutro Abs: 2.9 10*3/uL (ref 1.7–7.7)
Neutrophils Relative %: 61 %
Platelet Count: 103 10*3/uL — ABNORMAL LOW (ref 150–400)
RBC: 3.45 MIL/uL — ABNORMAL LOW (ref 3.87–5.11)
RDW: 23.3 % — ABNORMAL HIGH (ref 11.5–15.5)
WBC Count: 4.8 10*3/uL (ref 4.0–10.5)
nRBC: 0 % (ref 0.0–0.2)

## 2021-12-09 LAB — CMP (CANCER CENTER ONLY)
ALT: 31 U/L (ref 0–44)
AST: 39 U/L (ref 15–41)
Albumin: 3.7 g/dL (ref 3.5–5.0)
Alkaline Phosphatase: 108 U/L (ref 38–126)
Anion gap: 7 (ref 5–15)
BUN: 14 mg/dL (ref 6–20)
CO2: 25 mmol/L (ref 22–32)
Calcium: 9.5 mg/dL (ref 8.9–10.3)
Chloride: 104 mmol/L (ref 98–111)
Creatinine: 0.56 mg/dL (ref 0.44–1.00)
GFR, Estimated: >60 mL/min (ref >60)
Glucose, Bld: 108 mg/dL — ABNORMAL HIGH (ref 70–99)
Potassium: 3.9 mmol/L (ref 3.5–5.1)
Sodium: 136 mmol/L (ref 135–145)
Total Bilirubin: 1.4 mg/dL — ABNORMAL HIGH (ref 0.3–1.2)
Total Protein: 7.1 g/dL (ref 6.5–8.1)

## 2021-12-09 LAB — CEA (IN HOUSE-CHCC): CEA (CHCC-In House): 18.58 ng/mL — ABNORMAL HIGH (ref 0.00–5.00)

## 2021-12-09 MED ORDER — SODIUM CHLORIDE (PF) 0.9 % IJ SOLN
INTRAMUSCULAR | Status: AC
  Filled 2021-12-09: qty 50

## 2021-12-09 MED ORDER — IOHEXOL 300 MG/ML  SOLN
100.0000 mL | Freq: Once | INTRAMUSCULAR | Status: AC | PRN
  Administered 2021-12-09: 80 mL via INTRAVENOUS

## 2021-12-10 ENCOUNTER — Ambulatory Visit: Payer: 59 | Admitting: Hematology

## 2021-12-10 ENCOUNTER — Other Ambulatory Visit: Payer: 59

## 2021-12-10 ENCOUNTER — Ambulatory Visit: Payer: 59

## 2021-12-13 ENCOUNTER — Other Ambulatory Visit: Payer: Self-pay | Admitting: Nurse Practitioner

## 2021-12-14 ENCOUNTER — Other Ambulatory Visit: Payer: Self-pay | Admitting: Hematology

## 2021-12-16 ENCOUNTER — Inpatient Hospital Stay: Payer: 59

## 2021-12-16 ENCOUNTER — Inpatient Hospital Stay: Payer: 59 | Attending: Hematology | Admitting: Hematology

## 2021-12-16 ENCOUNTER — Encounter: Payer: Self-pay | Admitting: Hematology

## 2021-12-16 ENCOUNTER — Other Ambulatory Visit: Payer: Self-pay

## 2021-12-16 VITALS — BP 116/84 | HR 80 | Temp 97.6°F | Resp 18 | Ht 64.0 in | Wt 120.1 lb

## 2021-12-16 DIAGNOSIS — Z7901 Long term (current) use of anticoagulants: Secondary | ICD-10-CM | POA: Diagnosis not present

## 2021-12-16 DIAGNOSIS — Z86718 Personal history of other venous thrombosis and embolism: Secondary | ICD-10-CM | POA: Insufficient documentation

## 2021-12-16 DIAGNOSIS — C19 Malignant neoplasm of rectosigmoid junction: Secondary | ICD-10-CM | POA: Insufficient documentation

## 2021-12-16 DIAGNOSIS — C7800 Secondary malignant neoplasm of unspecified lung: Secondary | ICD-10-CM | POA: Insufficient documentation

## 2021-12-16 DIAGNOSIS — C787 Secondary malignant neoplasm of liver and intrahepatic bile duct: Secondary | ICD-10-CM | POA: Diagnosis not present

## 2021-12-16 DIAGNOSIS — Z5112 Encounter for antineoplastic immunotherapy: Secondary | ICD-10-CM | POA: Diagnosis not present

## 2021-12-16 LAB — TOTAL PROTEIN, URINE DIPSTICK: Protein, ur: NEGATIVE mg/dL

## 2021-12-16 MED ORDER — SODIUM CHLORIDE 0.9 % IV SOLN: Freq: Once | INTRAVENOUS | Status: AC

## 2021-12-16 MED ORDER — SODIUM CHLORIDE 0.9 % IV SOLN
7.5000 mg/kg | Freq: Once | INTRAVENOUS | Status: AC
  Administered 2021-12-16: 400 mg via INTRAVENOUS
  Filled 2021-12-16: qty 16

## 2021-12-16 MED ORDER — DEXTROSE 5 % IV SOLN: Freq: Once | INTRAVENOUS | Status: AC

## 2021-12-16 NOTE — Progress Notes (Signed)
Per Dr. Burr Medico ok to treat without lab results today. Ok to use lab results from 12/09/21. ?

## 2021-12-16 NOTE — Patient Instructions (Signed)
[facility]   °Discharge Instructions: °Thank you for choosing [facility] to provide your oncology and hematology care.  ° °If you have a lab appointment with the Cancer Center, please go directly to the Cancer Center and check in at the registration area. °  °Wear comfortable clothing and clothing appropriate for easy access to any Portacath or PICC line.  ° °We strive to give you quality time with your provider. You may need to reschedule your appointment if you arrive late (15 or more minutes).  Arriving late affects you and other patients whose appointments are after yours.  Also, if you miss three or more appointments without notifying the office, you may be dismissed from the clinic at the provider’s discretion.    °  °For prescription refill requests, have your pharmacy contact our office and allow 72 hours for refills to be completed.   ° °Today you received the following chemotherapy and/or immunotherapy agents: avastin  °  °To help prevent nausea and vomiting after your treatment, we encourage you to take your nausea medication as directed. ° °BELOW ARE SYMPTOMS THAT SHOULD BE REPORTED IMMEDIATELY: °*FEVER GREATER THAN 100.4 F (38 °C) OR HIGHER °*CHILLS OR SWEATING °*NAUSEA AND VOMITING THAT IS NOT CONTROLLED WITH YOUR NAUSEA MEDICATION °*UNUSUAL SHORTNESS OF BREATH °*UNUSUAL BRUISING OR BLEEDING °*URINARY PROBLEMS (pain or burning when urinating, or frequent urination) °*BOWEL PROBLEMS (unusual diarrhea, constipation, pain near the anus) °TENDERNESS IN MOUTH AND THROAT WITH OR WITHOUT PRESENCE OF ULCERS (sore throat, sores in mouth, or a toothache) °UNUSUAL RASH, SWELLING OR PAIN  °UNUSUAL VAGINAL DISCHARGE OR ITCHING  ° °Items with * indicate a potential emergency and should be followed up as soon as possible or go to the Emergency Department if any problems should occur. ° °Please show the CHEMOTHERAPY ALERT CARD or IMMUNOTHERAPY ALERT CARD at check-in to  the Emergency Department and triage nurse. ° °Should you have questions after your visit or need to cancel or reschedule your appointment, please contact [facility]  Dept: [phone]  and follow the prompts.  Office hours are 8:00 a.m. to 4:30 p.m. Monday - Friday. Please note that voicemails left after 4:00 p.m. may not be returned until the following business day.  We are closed weekends and major holidays. You have access to a nurse at all times for urgent questions. Please call the main number to the clinic Dept: [phone] and follow the prompts. ° ° °For any non-urgent questions, you may also contact your provider using MyChart. We now offer e-Visits for anyone 18 and older to request care online for non-urgent symptoms. For details visit [email or web address]. °  °Also download the MyChart app! Go to the app store, search "MyChart", open the app, select [facility], and log in with your MyChart username and password. ° °Due to Covid, a mask is required upon entering the hospital/clinic. If you do not have a mask, one will be given to you upon arrival. For doctor visits, patients may have 1 support person aged 18 or older with them. For treatment visits, patients cannot have anyone with them due to current Covid guidelines and our immunocompromised population.  ° °

## 2021-12-16 NOTE — Progress Notes (Signed)
?[facility]   ?[phone] [phone]   ?Clinic Follow up Note  ? ?Patient Care Team: ?Truitt Merle, MD as PCP - General (Hematology) ?Truitt Merle, MD as Consulting Physician (Oncology) ?Milus Banister, MD as Attending Physician (Gastroenterology) ?Kyung Rudd, MD as Consulting Physician (Radiation Oncology) ? ?Date of Service:  12/16/2021 ? ?CHIEF COMPLAINT: f/u of metastatic colon cancer ? ?CURRENT THERAPY:  ?Xeloda and bevacizumab, q21d, started 03/13/21.  ?-Xeloda held 9/26-11/14/22, Beva held 04/25/21-09/15/21 due to hospitalization. ? -current Xeloda dose: $RemoveBefo'1000mg'pugRFvNaFlD$  AM, $Re'1500mg'hLc$  PM days 1-14 ? ?ASSESSMENT & PLAN:  ?Angela Mcpherson is a 61 y.o. female with  ? ?1. 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 She developed hematochezia and went to urgent care 05/2020. She met Dr. Ardis Hughs and underwent colonoscopy 02/04/21 showing rectosigmoid mass, with path confirming adenocarcinoma. ?-staging CT CAP 02/08/21 showed metastasis to lymph nodes and liver. PET on 03/04/21 confirmed hypermetabolism to rectal mass with extension into left perirectal fat, operator and periportal nodes, and two liver lesions. ?-FO showed MSS, low tumor mutation burden, and KRAS G12D mutation (+); baseline CEA 1,177.27 ?-she began Capox and bevacizumab on 03/13/21 ?-she was hospitalized 05/18/21 and chemo was held. She restarted Xeloda on 07/02/21. ?-restaging CT CAP 09/12/21 showed mixed response, but overall stable disease.  ?-we restarted bevacizumab on 09/16/21. She is tolerating treatment well overall. ?-restaging CT CAP on 12/09/21 showed: overall stable rectosigmoid mass; no findings for recurrent hepatic metastatic disease; stable lymph nodes. I reviewed the results and images with them today. Will continue current therapy. We discussed that surgery is not an option at this stage  ?-we  reduced her Xeloda dose to $Remov'1000mg'myzMCO$  AM and $Remo'1500mg'UlCdW$  PM at her last visit on 11/26/21 due to skin toxicity. ?-labs from 12/09/21 reviewed, her CEA is overall stable. CBC improved with dose reduction. We will proceed with beva as scheduled.  ?  ?2. Hepatitis C; Mild liver cirrhosis ?-CT CAP on 09/12/21 showed increase in moderately coarse nodular contour of the liver, concerning for early liver cirrhosis, and splenomegaly.  ?-Her husband had a hepatitis C and was treated 20 years ago. Due to no prior testing, patient was tested 10/04/21 and was positive. ?  ?3. H/o thrombosis and new ascites  ?-admitted 05/18/21 with acute encephalopathy, colitis, abdominal aorta mural thrombosis, and thrombocytopenia. ?-she is on Eliquis and will continue  ?-she had paracentesis done on 06/27/21 for abdominal swelling/bloating; cytology was negative ?-ascites resolved now  ? ? ?PLAN: ?-proceed with bevacizumab today ?-continue Xeloda at same reduced dose $RemoveBefo'1000mg'mHhICmDpNFv$  am and $Remo'1500mg'iRegD$  pm for 14 days, then off 7 days  ?-lab, f/u, and beva every 3 weeks ? ? ?No problem-specific Assessment & Plan notes found for this encounter. ? ? ?SUMMARY OF ONCOLOGIC HISTORY: ?Oncology History Overview Note  ?Cancer Staging ?Malignant neoplasm of rectosigmoid junction (HCC) ?Staging form: Colon and Rectum, AJCC 8th Edition ?- Clinical stage from 02/04/2021: Stage IVB (cTX, cN2, cM1b) - Signed by Truitt Merle, MD on 02/12/2021 ?Stage prefix: Initial diagnosis ? ?  ?Malignant neoplasm of rectosigmoid junction ([facility])  ?02/04/2021 Procedure  ? Colonoscopy by Dr Ardis Hughs 02/04/21 ?IMPRESSION ?- Two 2 to 6 mm polyps in the sigmoid colon and in the cecum, removed with a cold snare. ?Resected and retrieved. ?- A fungating, infiltrative and ulcerated partially obstructing mass was found in the rectosigmoid ?colon (distal edge about 7cm from the anus). The mass was  partially circumferential ?(involving one-half of the lumen circumference). The mass measured eight cm in length. This ?was  biopsied with a cold forceps for histology. The distal edge of the mass was tattooed with ?two submucosal injections of Spot (carbon black). Sigmoid colon felt somewhat fixed in place ?(large CRC or perhaps invasion into colon from other primary site?) ?- Internal hemorrhoids. ?- The examination was otherwise normal on direct and retroflexion views. ?  ?02/04/2021 Pathology Results  ? Diagnosis ?1. Cecum Biopsy, and sigmoid, polyp (s) 2 ?- TUBULAR ADENOMA (2). ?- NO HIGH-GRADE DYSPLASIA OR CARCINOMA. ?2. Colon, biopsy, rectosigmoid ?- INVASIVE ADENOCARCINOMA. ?- SEE NOTE. ?  ?02/04/2021 Cancer Staging  ? Staging form: Colon and Rectum, AJCC 8th Edition ?- Clinical stage from 02/04/2021: Stage IVB (cTX, cN2, cM1b) - Signed by Truitt Merle, MD on 02/12/2021 ?Stage prefix: Initial diagnosis ? ?  ?02/08/2021 Imaging  ? CT CHEST, ABDOMEN AND PELVIS W CONTRAST  ?IMPRESSION: ?1. There is a large, concentric mass of the rectosigmoid junction ?measuring 7.8 x 5.9 x 5.2 cm. Findings are consistent with primary ?colon malignancy identified by colonoscopy. ?2. Multiple enlarged perirectal, bilateral pelvic sidewall, and ?retroperitoneal lymph nodes, consistent with nodal metastatic ?disease. ?3. There are multiple small low-attenuation lesions of the liver ?parenchyma, incompletely characterized although suspicious for ?hepatic metastatic disease. PET-CT or multiphasic contrast enhanced ?MRI could be used to more clearly characterize. ?4. There are multiple small bilateral pulmonary nodules, measuring 3 ?mm and smaller, nonspecific although highly suspicious for early ?pulmonary metastatic disease given overall constellation of ?findings. ?  ?02/12/2021 Initial Diagnosis  ? Malignant neoplasm of rectosigmoid junction (HCC) ?  ?03/04/2021 PET scan  ? IMPRESSION: ?1. Intense hypermetabolic activity associated with rectal mass ?consistent with primary colorectal carcinoma. ?2. Local extension of primary tumor into the LEFT perirectal  fat. ?3. Hypermetabolic LEFT operator node consistent with local nodal ?metastasis. ?4. Hypermetabolic metastasis to the LEFT and RIGHT hepatic lobe (two lesions). ?5. Hypermetabolic periportal lymph node. ?6. Small pulmonary nodules without radiotracer activity. Recommend attention on follow-up. ?  ?03/13/2021 -  Chemotherapy  ? Patient is on Treatment Plan : COLORECTAL Bevacizumab q21d  ? ?  ?  ? Genetic Testing  ? Negative genetic testing. No pathogenic variants identified on the [facility] Multi-Cancer+RNA Panel. 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 The report date is 04/01/2021. ? ?The Multi-Cancer Panel + RNA offered by [facility] includes sequencing and/or deletion duplication testing of the following 84 genes: AIP, ALK, APC, ATM, AXIN2,BAP1,  BARD1, BLM, BMPR1A, BRCA1, BRCA2, BRIP1, CASR, CDC73, CDH1, CDK4, CDKN1B, CDKN1C, CDKN2A (p14ARF), CDKN2A (p16INK4a), CEBPA, CHEK2, CTNNA1, DICER1, DIS3L2, EGFR (c.2369C>T, p.Thr790Met variant only), EPCAM (Deletion/duplication testing only), FH, FLCN, GATA2, GPC3, GREM1 (Promoter region deletion/duplication testing only), HOXB13 (c.251G>A, p.Gly84Glu), HRAS, KIT, MAX, MEN1, MET, MITF (c.952G>A, p.Glu318Lys variant only), MLH1, MSH2, MSH3, MSH6, MUTYH, NBN, NF1, NF2, NTHL1, PALB2, PDGFRA, PHOX2B, PMS2, POLD1, POLE, POT1, PRKAR1A, PTCH1, PTEN, RAD50, RAD51C, RAD51D, RB1, RECQL4, RET, RUNX1, SDHAF2, SDHA (sequence changes only), SDHB, SDHC, SDHD, SMAD4, SMARCA4, SMARCB1, SMARCE1, STK11, SUFU, TERC, TERT, TMEM127, TP53, TSC1, TSC2, VHL, WRN and WT1. ?  ?05/18/2021 Imaging  ? CT AP ? ?IMPRESSION: ?1. There is new noncalcified thrombus in the infrarenal abdominal ?aorta causing severe stenosis. ?2. Wall thickening of the ascending colon, cecum and ileum worrisome for enterocolitis. Findings may be infectious, inflammatory or ischemic given findings in the aorta. No pneumatosis or free air. ?3. Small amount of ascites. ?4. Rectosigmoid mass has decreased in size. Left pelvic  lymph node  has decreased in size. ?5. Previously identified liver lesions not well characterized on ?this study secondary to motion artifact. ?  ?09/12/2021 Imaging  ? EXAM: ?CT CHEST, ABDOMEN, AND PELVIS WITH CONTR

## 2021-12-30 ENCOUNTER — Encounter: Payer: Self-pay | Admitting: Hematology

## 2021-12-31 ENCOUNTER — Other Ambulatory Visit: Payer: Self-pay

## 2021-12-31 MED ORDER — CAPECITABINE 500 MG PO TABS: ORAL_TABLET | ORAL | 0 refills | Status: DC

## 2022-01-02 ENCOUNTER — Encounter: Payer: Self-pay | Admitting: Gastroenterology

## 2022-01-06 ENCOUNTER — Inpatient Hospital Stay: Payer: 59

## 2022-01-06 ENCOUNTER — Other Ambulatory Visit: Payer: Self-pay

## 2022-01-06 ENCOUNTER — Encounter: Payer: Self-pay | Admitting: Hematology

## 2022-01-06 ENCOUNTER — Inpatient Hospital Stay (HOSPITAL_BASED_OUTPATIENT_CLINIC_OR_DEPARTMENT_OTHER): Payer: 59 | Admitting: Hematology

## 2022-01-06 VITALS — BP 125/77 | HR 96 | Temp 97.9°F | Resp 18 | Ht 64.0 in | Wt 118.8 lb

## 2022-01-06 DIAGNOSIS — C19 Malignant neoplasm of rectosigmoid junction: Secondary | ICD-10-CM

## 2022-01-06 DIAGNOSIS — Z5112 Encounter for antineoplastic immunotherapy: Secondary | ICD-10-CM | POA: Diagnosis not present

## 2022-01-06 DIAGNOSIS — R197 Diarrhea, unspecified: Secondary | ICD-10-CM | POA: Diagnosis not present

## 2022-01-06 LAB — CBC WITH DIFFERENTIAL (CANCER CENTER ONLY)
Abs Immature Granulocytes: 0.01 10*3/uL (ref 0.00–0.07)
Basophils Absolute: 0 10*3/uL (ref 0.0–0.1)
Basophils Relative: 0 %
Eosinophils Absolute: 0.1 10*3/uL (ref 0.0–0.5)
Eosinophils Relative: 2 %
HCT: 34.1 % — ABNORMAL LOW (ref 36.0–46.0)
Hemoglobin: 11.6 g/dL — ABNORMAL LOW (ref 12.0–15.0)
Immature Granulocytes: 0 %
Lymphocytes Relative: 18 %
Lymphs Abs: 1 10*3/uL (ref 0.7–4.0)
MCH: 33.8 pg (ref 26.0–34.0)
MCHC: 34 g/dL (ref 30.0–36.0)
MCV: 99.4 fL (ref 80.0–100.0)
Monocytes Absolute: 0.8 10*3/uL (ref 0.1–1.0)
Monocytes Relative: 14 %
Neutro Abs: 3.8 10*3/uL (ref 1.7–7.7)
Neutrophils Relative %: 66 %
Platelet Count: 91 10*3/uL — ABNORMAL LOW (ref 150–400)
RBC: 3.43 MIL/uL — ABNORMAL LOW (ref 3.87–5.11)
RDW: 23.8 % — ABNORMAL HIGH (ref 11.5–15.5)
WBC Count: 5.7 10*3/uL (ref 4.0–10.5)
nRBC: 0 % (ref 0.0–0.2)

## 2022-01-06 LAB — C DIFFICILE QUICK SCREEN W PCR REFLEX
C Diff antigen: NEGATIVE
C Diff interpretation: NOT DETECTED
C Diff toxin: NEGATIVE

## 2022-01-06 LAB — CMP (CANCER CENTER ONLY)
ALT: 26 U/L (ref 0–44)
AST: 34 U/L (ref 15–41)
Albumin: 3.6 g/dL (ref 3.5–5.0)
Alkaline Phosphatase: 116 U/L (ref 38–126)
Anion gap: 6 (ref 5–15)
BUN: 10 mg/dL (ref 6–20)
CO2: 25 mmol/L (ref 22–32)
Calcium: 9.2 mg/dL (ref 8.9–10.3)
Chloride: 105 mmol/L (ref 98–111)
Creatinine: 0.58 mg/dL (ref 0.44–1.00)
GFR, Estimated: >60 mL/min (ref >60)
Glucose, Bld: 110 mg/dL — ABNORMAL HIGH (ref 70–99)
Potassium: 4.1 mmol/L (ref 3.5–5.1)
Sodium: 136 mmol/L (ref 135–145)
Total Bilirubin: 1.2 mg/dL (ref 0.3–1.2)
Total Protein: 6.7 g/dL (ref 6.5–8.1)

## 2022-01-06 LAB — CEA (IN HOUSE-CHCC): CEA (CHCC-In House): 41.87 ng/mL — ABNORMAL HIGH (ref 0.00–5.00)

## 2022-01-06 LAB — TOTAL PROTEIN, URINE DIPSTICK: Protein, ur: NEGATIVE mg/dL

## 2022-01-06 MED ORDER — DIPHENOXYLATE-ATROPINE 2.5-0.025 MG PO TABS: 2.0000 | ORAL_TABLET | Freq: Four times a day (QID) | ORAL | 0 refills | Status: DC | PRN

## 2022-01-06 NOTE — Progress Notes (Signed)
[place]   [phone] [phone]   Clinic Follow up Note   Patient Care Team: Truitt Merle, MD as PCP - General (Hematology) Truitt Merle, MD as Consulting Physician (Oncology) Milus Banister, MD as Attending Physician (Gastroenterology) Kyung Rudd, MD as Consulting Physician (Radiation Oncology)  Date of Service:  01/06/2022  CHIEF COMPLAINT: f/u of metastatic colon cancer  CURRENT THERAPY:  Xeloda and bevacizumab, q21d, started 03/13/21.  -Xeloda held 9/26-11/14/22, Beva held 04/25/21-09/15/21 due to hospitalization.             -current Xeloda dose: 103m AM, 15026mPM days 1-14  ASSESSMENT & PLAN:  Angela Mcpherson a [age].o. female with   1. 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 She developed hematochezia and went to urgent care 05/2020. She met Dr. JaArdis Hughsnd underwent colonoscopy 02/04/21 showing rectosigmoid mass, with path confirming adenocarcinoma. -staging CT CAP 02/08/21 showed metastasis to lymph nodes and liver. PET on 03/04/21 confirmed hypermetabolism to rectal mass with extension into left perirectal fat, operator and periportal nodes, and two liver lesions. -FO showed MSS, low tumor mutation burden, and KRAS G12D mutation (+); baseline CEA 1,177.27 -she began Capox and bevacizumab on 03/13/21 -she was hospitalized 05/18/21 and chemo was held. She restarted Xeloda on 07/02/21. -we restarted bevacizumab on 09/16/21. She is tolerating well overall. -restaging CT CAP on 12/09/21 showed: overall stable rectosigmoid mass; no findings for recurrent hepatic metastatic disease; stable lymph nodes. Will continue current therapy. We previously discussed that surgery is not an option at this stage  -we reduced her Xeloda dose to 100060mM and 1500m72m on 11/26/21 due to skin toxicity. -given her diarrhea, she would like  to hold treatment today. We will move her next scheduled treatment up a week, to give her a two week break.  2. Symptom Management: Diarrhea -she has been having intermittent diarrhea for about the last 2 months. She notes imodium only works sometimes. She developed severe diarrhea since last night with urgency and stool incontinent sometime, with abdominal cramps, no fever or chills  -I will call in lomotil for her to try. We will also check for c.diff today. -we will hold treatment today. She doe snot need ivf   2. Hepatitis C; Mild liver cirrhosis -CT CAP on 09/12/21 showed increase in moderately coarse nodular contour of the liver, concerning for early liver cirrhosis, and splenomegaly.  -Her husband had a hepatitis C and was treated 20 years ago. Due to no prior testing, patient was tested 10/04/21 and was positive.   3. H/o thrombosis and new ascites  -admitted 05/18/21 with acute encephalopathy, colitis, abdominal aorta mural thrombosis, and thrombocytopenia. -she is on Eliquis and will continue  -she had paracentesis done on 06/27/21 for abdominal swelling/bloating; cytology was negative -ascites resolved now      PLAN: -hold bevacizumab and Xeloda today due to diarrhea  -stool c-diff today, if negative, OK to use lomotil which I called in  -will move scheduled appointments from 6/12 to 6/5 or 6/6   No problem-specific Assessment & Plan notes found for this encounter.   SUMMARY OF ONCOLOGIC HISTORY: Oncology History Overview Note  Cancer Staging Malignant ne[facility] of rectosigmoid junction ([facility] form: Colon and Rectum, AJCC 8th Edition - Clinical stage from 02/04/2021: Stage IVB (cTX, cN2, cM1b) - Signed by FengTruitt Merle on 02/12/2021 Stage prefix: Initial diagnosis  Malignant neoplasm of rectosigmoid junction (Angela Mcpherson)  02/04/2021 Procedure   Colonoscopy by Dr Ardis Hughs 02/04/21 IMPRESSION - Two 2 to 6 mm polyps in the sigmoid colon and in the cecum, removed with a cold  snare. Resected and retrieved. - A fungating, infiltrative and ulcerated partially obstructing mass was found in the rectosigmoid colon (distal edge about 7cm from the anus). The mass was partially circumferential (involving one-half of the lumen circumference). The mass measured eight cm in length. This was biopsied with a cold forceps for histology. The distal edge of the mass was tattooed with two submucosal injections of Spot (carbon black). Sigmoid colon felt somewhat fixed in place (large CRC or perhaps invasion into colon from other primary site?) - Internal hemorrhoids. - The examination was otherwise normal on direct and retroflexion views.   02/04/2021 Pathology Results   Diagnosis 1. 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 - NO HIGH-GRADE DYSPLASIA OR CARCINOMA. 2. Colon, biopsy, rectosigmoid - INVASIVE ADENOCARCINOMA. - SEE NOTE.   02/04/2021 Cancer Staging   Staging form: Colon and Rectum, AJCC 8th Edition - Clinical stage from 02/04/2021: Stage IVB (cTX, cN2, cM1b) - Signed by Truitt Merle, MD on 02/12/2021 Stage prefix: Initial diagnosis    02/08/2021 Imaging   CT CHEST, ABDOMEN AND PELVIS W CONTRAST  IMPRESSION: 1. There is a large, concentric mass of the rectosigmoid junction measuring 7.8 x 5.9 x 5.2 cm. Findings are consistent with primary colon malignancy identified by colonoscopy. 2. Multiple enlarged perirectal, bilateral pelvic sidewall, and retroperitoneal lymph nodes, consistent with nodal metastatic disease. 3. There are multiple small low-attenuation lesions of the liver parenchyma, incompletely characterized although suspicious for hepatic metastatic disease. PET-CT or multiphasic contrast enhanced MRI could be used to more clearly characterize. 4. There are multiple small bilateral pulmonary nodules, measuring 3 mm and smaller, nonspecific although highly suspicious for early pulmonary metastatic disease given overall constellation  of findings.   02/12/2021 Initial Diagnosis   Malignant neoplasm of rectosigmoid junction ([place])   03/04/2021 PET scan   IMPRESSION: 1. Intense hypermetabolic activity associated with rectal mass consistent with primary colorectal carcinoma. 2. Local extension of primary tumor into the LEFT perirectal fat. 3. Hypermetabolic LEFT operator node consistent with local nodal metastasis. 4. Hypermetabolic metastasis to the LEFT and RIGHT hepatic lobe (two lesions). 5. Hypermetabolic periportal lymph node. 6. Small pulmonary nodules without radiotracer activity. Recommend attention on follow-up.   03/13/2021 -  Chemotherapy   Patient is on Treatment Plan : COLORECTAL Bevacizumab q21d       Genetic Testing   Negative genetic testing. No pathogenic variants identified on the [facility] Multi-Cancer+RNA Panel. 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 The report date is 04/01/2021.  The Multi-Cancer Panel + RNA offered by [facility] includes sequencing and/or deletion duplication testing of the following 84 genes: AIP, ALK, APC, ATM, AXIN2,BAP1,  BARD1, BLM, BMPR1A, BRCA1, BRCA2, BRIP1, CASR, CDC73, CDH1, CDK4, CDKN1B, CDKN1C, CDKN2A (p14ARF), CDKN2A (p16INK4a), CEBPA, CHEK2, CTNNA1, DICER1, DIS3L2, EGFR (c.2369C>T, p.Thr790Met variant only), EPCAM (Deletion/duplication testing only), FH, FLCN, GATA2, GPC3, GREM1 (Promoter region deletion/duplication testing only), HOXB13 (c.251G>A, p.Gly84Glu), HRAS, KIT, MAX, MEN1, MET, MITF (c.952G>A, p.Glu318Lys variant only), MLH1, MSH2, MSH3, MSH6, MUTYH, NBN, NF1, NF2, NTHL1, PALB2, PDGFRA, PHOX2B, PMS2, POLD1, POLE, POT1, PRKAR1A, PTCH1, PTEN, RAD50, RAD51C, RAD51D, RB1, RECQL4, RET, RUNX1, SDHAF2, SDHA (sequence changes only), SDHB, SDHC, SDHD, SMAD4, SMARCA4, SMARCB1, SMARCE1, STK11, SUFU, TERC, TERT, TMEM127, TP53, TSC1, TSC2, VHL, WRN and WT1.   05/18/2021 Imaging   CT  AP  IMPRESSION: 1. There is new noncalcified thrombus in the infrarenal abdominal aorta  causing severe stenosis. 2. Wall thickening of the ascending colon, cecum and ileum worrisome for enterocolitis. Findings may be infectious, inflammatory or ischemic given findings in the aorta. No pneumatosis or free air. 3. Small amount of ascites. 4. Rectosigmoid mass has decreased in size. Left pelvic lymph node has decreased in size. 5. Previously identified liver lesions not well characterized on this study secondary to motion artifact.   09/12/2021 Imaging   EXAM: CT CHEST, ABDOMEN, AND PELVIS WITH CONTRAST  IMPRESSION: 1. Some previously seen tiny bilateral pulmonary nodules are resolved and not apparent on this examination, most consistent with treatment response of small pulmonary metastases. These were too small to demonstrate PET avidity at the time of initial staging. 2. Small hypodense liver lesions are significantly diminished in size compared to prior examination dated 02/08/2021, comparison to examination dated 05/18/2021 very limited by breath motion artifact. Several of these lesions were previously PET avid. Findings are consistent with treatment response of hepatic metastatic disease. 3. Interval increase in size of a posterior pelvic sidewall or perirectal lymph node when compared to prior dated 05/18/2021, previously PET avid and consistent with worsened nodal metastatic disease. 4. Additional prominent retroperitoneal lymph nodes are unchanged, these nodes not previously PET avid. Attention on follow-up. 5. Unchanged residual concentric wall thickening of the rectosigmoid colon, consistent with treated primary malignancy. 6. There is a somewhat coarse, nodular contour of the liver, this appearance increased compared to prior examinations and perhaps reflecting pseudocirrhosis in the setting of treated hepatic metastases. 7. New splenomegaly, of uncertain significance. The portal and splenic veins are patent. 8. Coronary artery disease.      INTERVAL HISTORY:  Angela Mcpherson is here for a follow up of metastatic colon cancer. She was last seen by me on 12/16/21. She presents to the clinic accompanied by her husband. She reports she is having diarrhea today. She notes she has already gone to the restroom 5 times today. She ran to the restroom during our visit today. She explains she took imodium last night to try to prevent the diarrhea, but this did not work. She reports the diarrhea has been ongoing for about the past 2 months. She also reports a new pain to the left side of her coccyx that sometimes runs down her leg.   All other systems were reviewed with the patient and are negative.  MEDICAL HISTORY:  Past Medical History:  Diagnosis Date   Colorectal cancer ([place])    Family history of brain cancer    Family history of prostate cancer    Family history of stomach cancer    Heart murmur    dx at 26   MVP is stable   Hemorrhoids     SURGICAL HISTORY: Past Surgical History:  Procedure Laterality Date   NO PAST SURGERIES      I have reviewed the social history and family history with the patient and they are unchanged from previous note.  ALLERGIES:  is allergic to codeine, epinephrine (anaphylaxis), orange fruit [citrus], streptogramins, and tomato.  MEDICATIONS:  Current Outpatient Medications  Medication Sig Dispense Refill   diphenoxylate-atropine (LOMOTIL) 2.5-0.025 MG tablet Take 2 tablets by mouth 4 (four) times daily as needed for diarrhea or loose stools. 60 tablet 0   acetaminophen (TYLENOL) 325 MG tablet Take 650 mg by mouth every 6 (six) hours as needed for moderate pain.     capecitabine (XELODA) 500  MG tablet TAKE 2 TABLETS BY MOUTH IN THE AM AND 3 TAB BY MOUTH IN PM (12 HOURS APART) FOR 14  DAYS ON THEN 7 DAYS OFF 140 tablet 0   cholecalciferol (VITAMIN D3) 25 MCG (1000 UNIT) tablet Take 1,000 Units by mouth daily.     ELIQUIS 5 MG TABS tablet TAKE 1 TABLET BY MOUTH TWICE A DAY 60 tablet 2   KLOR-CON M20 20 MEQ tablet TAKE 1 TABLET  BY MOUTH TWICE A DAY 180 tablet 1   loperamide (IMODIUM) 2 MG capsule Take 1 capsule (2 mg total) by mouth as needed for diarrhea or loose stools. 30 capsule 0   ondansetron (ZOFRAN) 8 MG tablet Take 1 tablet (8 mg total) by mouth 2 (two) times daily as needed for refractory nausea / vomiting. Start on day 3 after chemotherapy. 30 tablet 1   VALERIAN ROOT PO Take 1 tablet by mouth daily.     No current facility-administered medications for this visit.    PHYSICAL EXAMINATION: ECOG PERFORMANCE STATUS: 2 - Symptomatic, <50% confined to bed  Vitals:   01/06/22 0903  BP: 125/77  Pulse: 96  Resp: 18  Temp: 97.9 F (36.6 C)  SpO2: 100%   Wt Readings from Last 3 Encounters:  01/06/22 118 lb 12.8 oz (53.9 kg)  12/16/21 120 lb 1.6 oz (54.5 kg)  11/26/21 116 lb 4.8 oz (52.8 kg)     GENERAL:alert, no distress and comfortable SKIN: skin color normal, no rashes or significant lesions EYES: normal, Conjunctiva are pink and non-injected, sclera clear  NEURO: alert & oriented x 3 with fluent speech  LABORATORY DATA:  I have reviewed the data as listed    Latest Ref Rng & Units 01/06/2022    8:19 AM 12/09/2021    1:56 PM 11/26/2021   10:07 AM  CBC  WBC 4.0 - 10.5 K/uL 5.7   4.8   3.7    Hemoglobin 12.0 - 15.0 g/dL 11.6   11.3   10.0    Hematocrit 36.0 - 46.0 % 34.1   33.4   30.7    Platelets 150 - 400 K/uL 91   103   86          Latest Ref Rng & Units 01/06/2022    8:19 AM 12/09/2021    1:56 PM 11/26/2021   10:07 AM  CMP  Glucose 70 - 99 mg/dL 110   108   146    BUN 6 - 20 mg/dL _0 Creatinine 0.44 - 1.00 mg/dL 0.58   0.56   0.51    Sodium 135 - 145 mmol/L 136   136   137    Potassium 3.5 - 5.1 mmol/L 4.1   3.9   3.9    Chloride 98 - 111 mmol/L 105   104   107    CO2 22 - 32 mmol/L _1 Calcium 8.9 - 10.3 mg/dL 9.2   9.5   9.2    Total Protein 6.5 - 8.1 g/dL 6.7   7.1   6.4    Total Bilirubin 0.3 - 1.2 mg/dL 1.2   1.4   0.6    Alkaline Phos 38 - 126 U/L  116   108   99    AST 15 - 41 U/L 34   39   26    ALT 0 - 44 U/L 26   31  17        RADIOGRAPHIC STUDIES: I have personally reviewed the radiological images as listed and agreed with the findings in the report. No results found.    Orders Placed This Encounter  Procedures   C difficile quick screen w PCR reflex    Standing Status:   Future    Standing Expiration Date:   01/07/2023   All questions were answered. The patient knows to call the clinic with any problems, questions or concerns. No barriers to learning was detected. The total time spent in the appointment was 30 minutes.     Truitt Merle, MD 01/06/2022   I, Wilburn Mylar, am acting as scribe for Truitt Merle, MD.   I have reviewed the above documentation for accuracy and completeness, and I agree with the above.

## 2022-01-18 ENCOUNTER — Other Ambulatory Visit: Payer: Self-pay | Admitting: Hematology

## 2022-01-19 NOTE — Progress Notes (Unsigned)
[place]   Telephone:([phone]) [phone] Fax:(336) [phone]   Clinic Follow up Note   Patient Care Team: Truitt Merle, MD as PCP - General (Hematology) Truitt Merle, MD as Consulting Physician (Oncology) Milus Banister, MD as Attending Physician (Gastroenterology) Kyung Rudd, MD as Consulting Physician (Radiation Oncology) 01/20/2022  CHIEF COMPLAINT: Follow up metastatic colon cancer   SUMMARY OF ONCOLOGIC HISTORY: Oncology History Overview Note  Cancer Staging Malignant neoplasm of rectosigmoid junction [facility]) Staging form: Colon and Rectum, AJCC 8th Edition - Clinical stage from 02/04/2021: Stage IVB (cTX, cN2, cM1b) - Signed by Truitt Merle, MD on 02/12/2021 Stage prefix: Initial diagnosis    Malignant neoplasm of rectosigmoid junction ([place])  02/04/2021 Procedure   Colonoscopy by Dr Ardis Hughs 02/04/21 IMPRESSION - Two 2 to 6 mm polyps in the sigmoid colon and in the cecum, removed with a cold snare. Resected and retrieved. - A fungating, infiltrative and ulcerated partially obstructing mass was found in the rectosigmoid colon (distal edge about 7cm from the anus). The mass was partially circumferential (involving one-half of the lumen circumference). The mass measured eight cm in length. This was biopsied with a cold forceps for histology. The distal edge of the mass was tattooed with two submucosal injections of Spot (carbon black). Sigmoid colon felt somewhat fixed in place (large CRC or perhaps invasion into colon from other primary site?) - Internal hemorrhoids. - The examination was otherwise normal on direct and retroflexion views.   02/04/2021 Pathology Results   Diagnosis 1. 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 - NO HIGH-GRADE DYSPLASIA OR CARCINOMA. 2. Colon, biopsy, rectosigmoid - INVASIVE ADENOCARCINOMA. - SEE NOTE.   02/04/2021 Cancer Staging   Staging form: Colon and Rectum, AJCC 8th Edition - Clinical stage from 02/04/2021: Stage  IVB (cTX, cN2, cM1b) - Signed by Truitt Merle, MD on 02/12/2021 Stage prefix: Initial diagnosis    02/08/2021 Imaging   CT CHEST, ABDOMEN AND PELVIS W CONTRAST  IMPRESSION: 1. There is a large, concentric mass of the rectosigmoid junction measuring 7.8 x 5.9 x 5.2 cm. Findings are consistent with primary colon malignancy identified by colonoscopy. 2. Multiple enlarged perirectal, bilateral pelvic sidewall, and retroperitoneal lymph nodes, consistent with nodal metastatic disease. 3. There are multiple small low-attenuation lesions of the liver parenchyma, incompletely characterized although suspicious for hepatic metastatic disease. PET-CT or multiphasic contrast enhanced MRI could be used to more clearly characterize. 4. There are multiple small bilateral pulmonary nodules, measuring 3 mm and smaller, nonspecific although highly suspicious for early pulmonary metastatic disease given overall constellation of findings.   02/12/2021 Initial Diagnosis   Malignant neoplasm of rectosigmoid junction ([place])   03/04/2021 PET scan   IMPRESSION: 1. Intense hypermetabolic activity associated with rectal mass consistent with primary colorectal carcinoma. 2. Local extension of primary tumor into the LEFT perirectal fat. 3. Hypermetabolic LEFT operator node consistent with local nodal metastasis. 4. Hypermetabolic metastasis to the LEFT and RIGHT hepatic lobe (two lesions). 5. Hypermetabolic periportal lymph node. 6. Small pulmonary nodules without radiotracer activity. Recommend attention on follow-up.   03/13/2021 -  Chemotherapy   Patient is on Treatment Plan : COLORECTAL Bevacizumab q21d       Genetic Testing   Negative genetic testing. No pathogenic variants identified on the [facility] Multi-Cancer+RNA Panel. 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 The report date is 04/01/2021.  The Multi-Cancer Panel + RNA offered by [facility] includes sequencing and/or deletion duplication testing of the  following 84 genes: AIP, ALK, APC,  ATM, AXIN2,BAP1,  BARD1, BLM, BMPR1A, BRCA1, BRCA2, BRIP1, CASR, CDC73, CDH1, CDK4, CDKN1B, CDKN1C, CDKN2A (p14ARF), CDKN2A (p16INK4a), CEBPA, CHEK2, CTNNA1, DICER1, DIS3L2, EGFR (c.2369C>T, p.Thr790Met variant only), EPCAM (Deletion/duplication testing only), FH, FLCN, GATA2, GPC3, GREM1 (Promoter region deletion/duplication testing only), HOXB13 (c.251G>A, p.Gly84Glu), HRAS, KIT, MAX, MEN1, MET, MITF (c.952G>A, p.Glu318Lys variant only), MLH1, MSH2, MSH3, MSH6, MUTYH, NBN, NF1, NF2, NTHL1, PALB2, PDGFRA, PHOX2B, PMS2, POLD1, POLE, POT1, PRKAR1A, PTCH1, PTEN, RAD50, RAD51C, RAD51D, RB1, RECQL4, RET, RUNX1, SDHAF2, SDHA (sequence changes only), SDHB, SDHC, SDHD, SMAD4, SMARCA4, SMARCB1, SMARCE1, STK11, SUFU, TERC, TERT, TMEM127, TP53, TSC1, TSC2, VHL, WRN and WT1.   05/18/2021 Imaging   CT AP  IMPRESSION: 1. 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 2. Wall thickening of the ascending colon, cecum and ileum worrisome for enterocolitis. Findings may be infectious, inflammatory or ischemic given findings in the aorta. No pneumatosis or free air. 3. Small amount of ascites. 4. Rectosigmoid mass has decreased in size. Left pelvic lymph node has decreased in size. 5. Previously identified liver lesions not well characterized on this study secondary to motion artifact.   09/12/2021 Imaging   EXAM: CT CHEST, ABDOMEN, AND PELVIS WITH CONTRAST  IMPRESSION: 1. Some previously seen tiny bilateral pulmonary nodules are resolved and not apparent on this examination, most consistent with treatment response of small pulmonary metastases. These were too small to demonstrate PET avidity at the time of initial staging. 2. Small hypodense liver lesions are significantly diminished in size compared to prior examination dated 02/08/2021, comparison to examination dated 05/18/2021 very limited by breath motion artifact. Several of  these lesions were previously PET avid. Findings are consistent with treatment response of hepatic metastatic disease. 3. Interval increase in size of a posterior pelvic sidewall or perirectal lymph node when compared to prior dated 05/18/2021, previously PET avid and consistent with worsened nodal metastatic disease. 4. Additional prominent retroperitoneal lymph nodes are unchanged, these nodes not previously PET avid. Attention on follow-up. 5. Unchanged residual concentric wall thickening of the rectosigmoid colon, consistent with treated primary malignancy. 6. There is a somewhat coarse, nodular contour of the liver, this appearance increased compared to prior examinations and perhaps reflecting pseudocirrhosis in the setting of treated hepatic metastases. 7. New splenomegaly, of uncertain significance. The portal and splenic veins are patent. 8. Coronary artery disease.     CURRENT THERAPY: Xeloda and bevacizumab q21 days, starting 03/13/21 (Xeloda held 05/13/21 - 07/01/21, beva held 04/25/21 - 09/15/21 for hospitalization). Currently Xeloda dose 1000 mg AM/1500 mg PM days 1-14  INTERVAL HISTORY: Ms. Wojdyla returns for follow up as scheduled. Last seen by Dr. Burr Medico 01/06/22. This cycle was postponed 1 week due to diarrhea. C.dif was negative. She continues to have flares of loose stool every few days, alternates Imodium and Lomotil and this is very helpful.  She is able to remain hydrated.  She has pain in her tailbone if she is not able to pass gas, this has been ongoing since before diagnosis.  Gas-X is effective but she does not want to become reliant on it.  She had a heart pain episode last week (week off Xeloda) that was deep and sharp and occurred at rest.  She went to the bathroom and passed gas, pain went away.  She denies chest pain on exertion or with activity.  She does exercises.  Neuropathy is stable with B complex vitamin.  Skin toxicity is slightly worse, skin is dry with peeling and some  cracks but not painful unless she is gripping something.  She wears gloves to wash dishes.  She does not want to reduce the dose of chemo.  All other systems were reviewed with the patient and are negative.  MEDICAL HISTORY:  Past Medical History:  Diagnosis Date   Colorectal cancer ([place])    Family history of brain cancer    Family history of prostate cancer    Family history of stomach cancer    Heart murmur    dx at 26   MVP is stable   Hemorrhoids     SURGICAL HISTORY: Past Surgical History:  Procedure Laterality Date   NO PAST SURGERIES      I have reviewed the social history and family history with the patient and they are unchanged from previous note.  ALLERGIES:  is allergic to codeine, epinephrine (anaphylaxis), orange fruit [citrus], streptogramins, and tomato.  MEDICATIONS:  Current Outpatient Medications  Medication Sig Dispense Refill   acetaminophen (TYLENOL) 325 MG tablet Take 650 mg by mouth every 6 (six) hours as needed for moderate pain.     capecitabine (XELODA) 500 MG tablet TAKE 2 TABLETS BY MOUTH IN THE AM AND 3 TAB BY MOUTH IN PM (12 HOURS APART) FOR 14  DAYS ON THEN 7 DAYS OFF 140 tablet 0   cholecalciferol (VITAMIN D3) 25 MCG (1000 UNIT) tablet Take 1,000 Units by mouth daily.     diphenoxylate-atropine (LOMOTIL) 2.5-0.025 MG tablet Take 2 tablets by mouth 4 (four) times daily as needed for diarrhea or loose stools. 60 tablet 0   ELIQUIS 5 MG TABS tablet TAKE 1 TABLET BY MOUTH TWICE A DAY 60 tablet 2   KLOR-CON M20 20 MEQ tablet TAKE 1 TABLET BY MOUTH TWICE A DAY 180 tablet 1   loperamide (IMODIUM) 2 MG capsule Take 1 capsule (2 mg total) by mouth as needed for diarrhea or loose stools. 30 capsule 0   ondansetron (ZOFRAN) 8 MG tablet Take 1 tablet (8 mg total) by mouth 2 (two) times daily as needed for refractory nausea / vomiting. Start on day 3 after chemotherapy. 30 tablet 1   VALERIAN ROOT PO Take 1 tablet by mouth daily.     No current  facility-administered medications for this visit.    PHYSICAL EXAMINATION: ECOG PERFORMANCE STATUS: 1 - Symptomatic but completely ambulatory  Vitals:   01/20/22 1004  BP: 116/77  Pulse: 81  Resp: 16  Temp: 97.6 F (36.4 C)  SpO2: 100%   Filed Weights   01/20/22 1004  Weight: 121 lb (54.9 kg)    GENERAL:alert, no distress and comfortable SKIN: palms swollen with moderate erythema and mild peeling. No significant cracks  EYES:  sclera clear LUNGS: clear with normal breathing effort HEART: regular rate & rhythm, no lower extremity edema NEURO: alert & oriented x 3 with fluent speech   LABORATORY DATA:  I have reviewed the data as listed    Latest Ref Rng & Units 01/20/2022    9:31 AM 01/06/2022    8:19 AM 12/09/2021    1:56 PM  CBC  WBC 4.0 - 10.5 K/uL 3.9   5.7   4.8    Hemoglobin 12.0 - 15.0 g/dL 11.4   11.6   11.3    Hematocrit 36.0 - 46.0 % 34.3   34.1   33.4    Platelets 150 - 400 K/uL 88   91   103          Latest Ref Rng &  Units 01/20/2022    9:31 AM 01/06/2022    8:19 AM 12/09/2021    1:56 PM  CMP  Glucose 70 - 99 mg/dL 142   110   108    BUN 6 - 20 mg/dL 12   10   14     Creatinine 0.44 - 1.00 mg/dL 0.49   0.58   0.56    Sodium 135 - 145 mmol/L 138   136   136    Potassium 3.5 - 5.1 mmol/L 3.8   4.1   3.9    Chloride 98 - 111 mmol/L 108   105   104    CO2 22 - 32 mmol/L 24   25   25     Calcium 8.9 - 10.3 mg/dL 9.6   9.2   9.5    Total Protein 6.5 - 8.1 g/dL 6.2   6.7   7.1    Total Bilirubin 0.3 - 1.2 mg/dL 0.7   1.2   1.4    Alkaline Phos 38 - 126 U/L 116   116   108    AST 15 - 41 U/L 37   34   39    ALT 0 - 44 U/L 27   26   31         RADIOGRAPHIC STUDIES: I have personally reviewed the radiological images as listed and agreed with the findings in the report. No results found.   ASSESSMENT & PLAN: Angela Mcpherson is a 61 y.o. female with    1. 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 She developed hematochezia and went to urgent care 05/2020. She met Dr. Ardis Hughs and underwent colonoscopy 02/04/21 showing rectosigmoid mass, with path confirming adenocarcinoma. -staging CT CAP 02/08/21 showed metastasis to lymph nodes and liver. PET on 03/04/21 confirmed hypermetabolism to rectal mass with extension into left perirectal fat, operator and periportal nodes, and two liver lesions. -FO showed MSS, low tumor mutation burden, and KRAS G12D mutation (+); baseline CEA 1,177.27 -she began Capox and bevacizumab on 03/13/21 -tumor marker CEA dropped significantly after starting chemo, indicating good response to treatment.  -she was hospitalized 05/18/21 and chemo was held. She restarted Xeloda on 07/02/21. We have slowly increased her dose, currently 1500 mg BID.  -restaging CT CAP 09/12/21 showed mixed response, but overall stable disease.   -we restarted bevacizumab on 09/16/21. She is tolerating treatment well overall. Restaging CT CAP 12/09/2021 showed stable rectosigmoid mass and lymphadenopathy; no findings for recurrent hepatic metastatic disease -Ms. Goodgame appears stable. Diarrhea recovered, now periodic and well managed with alternating imodium and lomotil. She has pain at the coccyx when gas does not pass, which is stable since diagnosis. She tried to avoid medication if possible. Her main SE is hand-foot syndrome which appears slightly worse and does not recover with an additional week off.  -side effects are partially managed with supportive care at home. She is able to recover and function well for the most part, except hand/foot syndrome. -labs reviewed, stable except plt 88k. Given worsening hand-foot syndrome and thrombocytopenia, I recommend to reduce Xeloda to 1000 mg twice daily for 2 weeks on/1 week off.  She is worried about the reduced efficacy and prefers to continue at the current dose for now.  She will  continue symptom management -Continue Xeloda and proceed with bevacizumab today as planned -Follow-up in 3 weeks with next cycle.  2.  Atypical chest pain -She describes sharp left chest pain intermittently, at rest -the most recent episode occurred while on the couch, after eating fish, hush puppies, and macaroni and cheese. The pain resolved after passing gas -chest pain does not occur with exercise/exertion -this is likely not cardiac related, more likely GERD -I reviewed symptom management and to monitor    3. Hepatitis C; Mild liver cirrhosis -CT CAP on 09/12/21 showed increase in moderately coarse nodular contour of the liver, concerning for early liver cirrhosis,  and splenomegaly.  -Her husband had a hepatitis C and was treated 20 years ago. Due to no prior testing, patient was tested 10/04/21 and was positive. -thrombocytopenia    4. history of thrombosis and new ascites  -admitted 05/18/21 with acute encephalopathy, colitis, abdominal aorta mural thrombosis, and thrombocytopenia. -she is on Eliquis and will continue  -she had paracentesis done on 06/27/21 for abdominal swelling/bloating; cytology was negative -resolved   5. Goal of care discussion -We previously discussed the incurable nature of her cancer, and the overall poor prognosis, especially if she does not have good response to chemotherapy or progress on chemo -The patient understands the goal of care is palliative.   PLAN: -Labs reviewed -Continue Xeloda at the current dose 1000 mg a.m./1500 mg p.m. for 2 weeks on/1 week off, she started today -Proceed with bevacizumab today as planned -CEA today -continue symptom management for SE's -Follow-up in 3 weeks with next cycle   All questions were answered. The patient knows to call the clinic with any problems, questions or concerns. No barriers to learning was detected. I spent 20 minutes counseling the patient face to face. The total time spent in the appointment  was 30 minutes and more than 50% was on counseling and review of test results     Alla Feeling, NP 01/20/22

## 2022-01-20 ENCOUNTER — Inpatient Hospital Stay (HOSPITAL_BASED_OUTPATIENT_CLINIC_OR_DEPARTMENT_OTHER): Payer: 59 | Admitting: Nurse Practitioner

## 2022-01-20 ENCOUNTER — Other Ambulatory Visit: Payer: Self-pay

## 2022-01-20 ENCOUNTER — Inpatient Hospital Stay: Payer: 59 | Attending: Hematology

## 2022-01-20 ENCOUNTER — Encounter: Payer: Self-pay | Admitting: Nurse Practitioner

## 2022-01-20 ENCOUNTER — Inpatient Hospital Stay: Payer: 59

## 2022-01-20 ENCOUNTER — Encounter: Payer: Self-pay | Admitting: Hematology

## 2022-01-20 VITALS — BP 116/77 | HR 81 | Temp 97.6°F | Resp 16 | Ht 64.0 in | Wt 121.0 lb

## 2022-01-20 DIAGNOSIS — Z7901 Long term (current) use of anticoagulants: Secondary | ICD-10-CM | POA: Insufficient documentation

## 2022-01-20 DIAGNOSIS — Z5112 Encounter for antineoplastic immunotherapy: Secondary | ICD-10-CM | POA: Insufficient documentation

## 2022-01-20 DIAGNOSIS — C7800 Secondary malignant neoplasm of unspecified lung: Secondary | ICD-10-CM | POA: Insufficient documentation

## 2022-01-20 DIAGNOSIS — C19 Malignant neoplasm of rectosigmoid junction: Secondary | ICD-10-CM | POA: Diagnosis present

## 2022-01-20 DIAGNOSIS — Z8619 Personal history of other infectious and parasitic diseases: Secondary | ICD-10-CM | POA: Diagnosis not present

## 2022-01-20 DIAGNOSIS — R0789 Other chest pain: Secondary | ICD-10-CM | POA: Diagnosis not present

## 2022-01-20 DIAGNOSIS — C787 Secondary malignant neoplasm of liver and intrahepatic bile duct: Secondary | ICD-10-CM | POA: Insufficient documentation

## 2022-01-20 DIAGNOSIS — Z86718 Personal history of other venous thrombosis and embolism: Secondary | ICD-10-CM | POA: Insufficient documentation

## 2022-01-20 DIAGNOSIS — G629 Polyneuropathy, unspecified: Secondary | ICD-10-CM | POA: Insufficient documentation

## 2022-01-20 LAB — CMP (CANCER CENTER ONLY)
ALT: 27 U/L (ref 0–44)
AST: 37 U/L (ref 15–41)
Albumin: 3.4 g/dL — ABNORMAL LOW (ref 3.5–5.0)
Alkaline Phosphatase: 116 U/L (ref 38–126)
Anion gap: 6 (ref 5–15)
BUN: 12 mg/dL (ref 6–20)
CO2: 24 mmol/L (ref 22–32)
Calcium: 9.6 mg/dL (ref 8.9–10.3)
Chloride: 108 mmol/L (ref 98–111)
Creatinine: 0.49 mg/dL (ref 0.44–1.00)
GFR, Estimated: >60 mL/min (ref >60)
Glucose, Bld: 142 mg/dL — ABNORMAL HIGH (ref 70–99)
Potassium: 3.8 mmol/L (ref 3.5–5.1)
Sodium: 138 mmol/L (ref 135–145)
Total Bilirubin: 0.7 mg/dL (ref 0.3–1.2)
Total Protein: 6.2 g/dL — ABNORMAL LOW (ref 6.5–8.1)

## 2022-01-20 LAB — CBC WITH DIFFERENTIAL (CANCER CENTER ONLY)
Abs Immature Granulocytes: 0.01 10*3/uL (ref 0.00–0.07)
Basophils Absolute: 0 10*3/uL (ref 0.0–0.1)
Basophils Relative: 0 %
Eosinophils Absolute: 0.1 10*3/uL (ref 0.0–0.5)
Eosinophils Relative: 3 %
HCT: 34.3 % — ABNORMAL LOW (ref 36.0–46.0)
Hemoglobin: 11.4 g/dL — ABNORMAL LOW (ref 12.0–15.0)
Immature Granulocytes: 0 %
Lymphocytes Relative: 24 %
Lymphs Abs: 0.9 10*3/uL (ref 0.7–4.0)
MCH: 32.7 pg (ref 26.0–34.0)
MCHC: 33.2 g/dL (ref 30.0–36.0)
MCV: 98.3 fL (ref 80.0–100.0)
Monocytes Absolute: 0.5 10*3/uL (ref 0.1–1.0)
Monocytes Relative: 12 %
Neutro Abs: 2.4 10*3/uL (ref 1.7–7.7)
Neutrophils Relative %: 61 %
Platelet Count: 88 10*3/uL — ABNORMAL LOW (ref 150–400)
RBC: 3.49 MIL/uL — ABNORMAL LOW (ref 3.87–5.11)
RDW: 20.7 % — ABNORMAL HIGH (ref 11.5–15.5)
WBC Count: 3.9 10*3/uL — ABNORMAL LOW (ref 4.0–10.5)
nRBC: 0 % (ref 0.0–0.2)

## 2022-01-20 LAB — TOTAL PROTEIN, URINE DIPSTICK: Protein, ur: NEGATIVE mg/dL

## 2022-01-20 LAB — CEA (IN HOUSE-CHCC): CEA (CHCC-In House): 2.13 ng/mL (ref 0.00–5.00)

## 2022-01-20 MED ORDER — SODIUM CHLORIDE 0.9 % IV SOLN
7.5000 mg/kg | Freq: Once | INTRAVENOUS | Status: AC
  Administered 2022-01-20: 400 mg via INTRAVENOUS
  Filled 2022-01-20: qty 16

## 2022-01-20 MED ORDER — SODIUM CHLORIDE 0.9 % IV SOLN: Freq: Once | INTRAVENOUS | Status: AC

## 2022-01-20 NOTE — Progress Notes (Signed)
Per Cira Rue, NP, ok to treat with Plt 88

## 2022-01-20 NOTE — Patient Instructions (Signed)
[facility]ICAL [facility]COLOG[facility]  Discharge Instructions: °Thank you for choosing [facility] to provide your oncology and hematology care.  ° °If you have a lab appointment with the Cancer Center, please go directly to the Cancer Center and check in at the registration area. °  °Wear comfortable clothing and clothing appropriate for easy access to any Portacath or PICC line.  ° °We strive to give you quality time with your provider. You may need to reschedule your appointment if you arrive late (15 or more minutes).  Arriving late affects you and other patients whose appointments are after yours.  Also, if you miss three or more appointments without notifying the office, you may be dismissed from the clinic at the provider’s discretion.    °  °For prescription refill requests, have your pharmacy contact our office and allow 72 hours for refills to be completed.   ° °Today you received the following chemotherapy and/or immunotherapy agents: bevacizumab    °  °To help prevent nausea and vomiting after your treatment, we encourage you to take your nausea medication as directed. ° °BELOW ARE SYMPTOMS THAT SHOULD BE REPORTED IMMEDIATELY: °*FEVER GREATER THAN 100.4 F (38 °C) OR HIGHER °*CHILLS OR SWEATING °*NAUSEA AND VOMITING THAT IS NOT CONTROLLED WITH YOUR NAUSEA MEDICATION °*UNUSUAL SHORTNESS OF BREATH °*UNUSUAL BRUISING OR BLEEDING °*URINARY PROBLEMS (pain or burning when urinating, or frequent urination) °*BOWEL PROBLEMS (unusual diarrhea, constipation, pain near the anus) °TENDERNESS IN MOUTH AND THROAT WITH OR WITHOUT PRESENCE OF ULCERS (sore throat, sores in mouth, or a toothache) °UNUSUAL RASH, SWELLING OR PAIN  °UNUSUAL VAGINAL DISCHARGE OR ITCHING  ° °Items with * indicate a potential emergency and should be followed up as soon as possible or go to the Emergency Department if any problems should occur. ° °Please show the CHEMOTHERAPY ALERT CARD or IMMUNOTHERAPY ALERT CARD at check-in  to the Emergency Department and triage nurse. ° °Should you have questions after your visit or need to cancel or reschedule your appointment, please contact [facility]  Dept: [phone]  and follow the prompts.  Office hours are 8:00 a.m. to 4:30 p.m. Monday - Friday. Please note that voicemails left after 4:00 p.m. may not be returned until the following business day.  We are closed weekends and major holidays. You have access to a nurse at all times for urgent questions. Please call the main number to the clinic Dept: [phone] and follow the prompts. ° ° °For any non-urgent questions, you may also contact your provider using MyChart. We now offer e-Visits for anyone 18 and older to request care online for non-urgent symptoms. For details visit [email or web address]. °  °Also download the MyChart app! Go to the app store, search "MyChart", open the app, select [facility], and log in with your MyChart username and password. ° °Due to Covid, a mask is required upon entering the hospital/clinic. If you do not have a mask, one will be given to you upon arrival. For doctor visits, patients may have 1 support person aged 18 or older with them. For treatment visits, patients cannot have anyone with them due to current Covid guidelines and our immunocompromised population.  ° °

## 2022-01-21 ENCOUNTER — Telehealth: Payer: Self-pay | Admitting: Hematology

## 2022-01-21 NOTE — Telephone Encounter (Signed)
Left message with follow-up appointments per 6/5 los.

## 2022-01-24 ENCOUNTER — Telehealth: Payer: Self-pay

## 2022-01-24 NOTE — Telephone Encounter (Signed)
This nurse received a call from Dr. Shirlee More from the social security office in [place].  He stated that patient filed for Disability and he needs records for patients care for the period of 05/2020-01/2021.  This nurse advised that patient was not under Dr. Lewayne Bunting care until 01/2021 when the referral was received from Dr. Ardis Hughs.  He then requested office visit not from first visit and any scan results that may have been done in that month so that it can be established what the staging was on her cancer diagnosis so that her claim can be processed.  This nurse faxed notes as requested to the fax number provided as [phone]. No further questions or concerns at this time.

## 2022-01-27 ENCOUNTER — Other Ambulatory Visit: Payer: 59

## 2022-01-27 ENCOUNTER — Ambulatory Visit: Payer: 59

## 2022-01-27 ENCOUNTER — Ambulatory Visit: Payer: 59 | Admitting: Nurse Practitioner

## 2022-02-03 ENCOUNTER — Other Ambulatory Visit: Payer: Self-pay | Admitting: Hematology

## 2022-02-04 ENCOUNTER — Other Ambulatory Visit: Payer: Self-pay

## 2022-02-04 ENCOUNTER — Encounter: Payer: Self-pay | Admitting: Hematology

## 2022-02-04 MED ORDER — CAPECITABINE 500 MG PO TABS: ORAL_TABLET | ORAL | 0 refills | Status: DC

## 2022-02-06 ENCOUNTER — Other Ambulatory Visit: Payer: Self-pay | Admitting: Hematology

## 2022-02-07 ENCOUNTER — Other Ambulatory Visit: Payer: Self-pay

## 2022-02-07 MED ORDER — CAPECITABINE 500 MG PO TABS: ORAL_TABLET | ORAL | 0 refills | Status: DC

## 2022-02-10 ENCOUNTER — Other Ambulatory Visit: Payer: Self-pay

## 2022-02-10 ENCOUNTER — Inpatient Hospital Stay: Payer: 59

## 2022-02-10 ENCOUNTER — Encounter: Payer: Self-pay | Admitting: Hematology

## 2022-02-10 ENCOUNTER — Inpatient Hospital Stay (HOSPITAL_BASED_OUTPATIENT_CLINIC_OR_DEPARTMENT_OTHER): Payer: 59 | Admitting: Hematology

## 2022-02-10 VITALS — BP 116/76 | HR 96 | Temp 98.6°F | Resp 18 | Ht 64.0 in | Wt 121.1 lb

## 2022-02-10 DIAGNOSIS — C19 Malignant neoplasm of rectosigmoid junction: Secondary | ICD-10-CM | POA: Diagnosis not present

## 2022-02-10 DIAGNOSIS — Z5112 Encounter for antineoplastic immunotherapy: Secondary | ICD-10-CM | POA: Diagnosis not present

## 2022-02-10 LAB — CMP (CANCER CENTER ONLY)
ALT: 29 U/L (ref 0–44)
AST: 36 U/L (ref 15–41)
Albumin: 3.6 g/dL (ref 3.5–5.0)
Alkaline Phosphatase: 115 U/L (ref 38–126)
Anion gap: 6 (ref 5–15)
BUN: 13 mg/dL (ref 6–20)
CO2: 23 mmol/L (ref 22–32)
Calcium: 9.4 mg/dL (ref 8.9–10.3)
Chloride: 108 mmol/L (ref 98–111)
Creatinine: 0.53 mg/dL (ref 0.44–1.00)
GFR, Estimated: >60 mL/min (ref >60)
Glucose, Bld: 132 mg/dL — ABNORMAL HIGH (ref 70–99)
Potassium: 3.7 mmol/L (ref 3.5–5.1)
Sodium: 137 mmol/L (ref 135–145)
Total Bilirubin: 1.3 mg/dL — ABNORMAL HIGH (ref 0.3–1.2)
Total Protein: 6.4 g/dL — ABNORMAL LOW (ref 6.5–8.1)

## 2022-02-10 LAB — CBC WITH DIFFERENTIAL (CANCER CENTER ONLY)
Abs Immature Granulocytes: 0 10*3/uL (ref 0.00–0.07)
Basophils Absolute: 0 10*3/uL (ref 0.0–0.1)
Basophils Relative: 0 %
Eosinophils Absolute: 0.1 10*3/uL (ref 0.0–0.5)
Eosinophils Relative: 2 %
HCT: 33.9 % — ABNORMAL LOW (ref 36.0–46.0)
Hemoglobin: 11.4 g/dL — ABNORMAL LOW (ref 12.0–15.0)
Immature Granulocytes: 0 %
Lymphocytes Relative: 29 %
Lymphs Abs: 1.1 10*3/uL (ref 0.7–4.0)
MCH: 32.8 pg (ref 26.0–34.0)
MCHC: 33.6 g/dL (ref 30.0–36.0)
MCV: 97.4 fL (ref 80.0–100.0)
Monocytes Absolute: 0.6 10*3/uL (ref 0.1–1.0)
Monocytes Relative: 16 %
Neutro Abs: 2.1 10*3/uL (ref 1.7–7.7)
Neutrophils Relative %: 53 %
Platelet Count: 80 10*3/uL — ABNORMAL LOW (ref 150–400)
RBC: 3.48 MIL/uL — ABNORMAL LOW (ref 3.87–5.11)
RDW: 21.8 % — ABNORMAL HIGH (ref 11.5–15.5)
WBC Count: 3.9 10*3/uL — ABNORMAL LOW (ref 4.0–10.5)
nRBC: 0 % (ref 0.0–0.2)

## 2022-02-10 LAB — TOTAL PROTEIN, URINE DIPSTICK: Protein, ur: NEGATIVE mg/dL

## 2022-02-10 LAB — CEA (IN HOUSE-CHCC): CEA (CHCC-In House): 25.39 ng/mL — ABNORMAL HIGH (ref 0.00–5.00)

## 2022-02-10 MED ORDER — SODIUM CHLORIDE 0.9 % IV SOLN
7.5000 mg/kg | Freq: Once | INTRAVENOUS | Status: AC
  Administered 2022-02-10: 400 mg via INTRAVENOUS
  Filled 2022-02-10: qty 16

## 2022-02-10 MED ORDER — SODIUM CHLORIDE 0.9 % IV SOLN: Freq: Once | INTRAVENOUS | Status: AC

## 2022-02-10 NOTE — Progress Notes (Signed)
[facility]   [phone] [phone]   Clinic Follow up Note   Patient Care Team: Malachy Mood, MD as PCP - General (Hematology) Malachy Mood, MD as Consulting Physician (Oncology) Rachael Fee, MD as Attending Physician (Gastroenterology) Dorothy Puffer, MD as Consulting Physician (Radiation Oncology)  Date of Service:  02/10/2022  CHIEF COMPLAINT: f/u of metastatic colon cancer  CURRENT THERAPY:  Xeloda and bevacizumab, q21d, started 03/13/21             -current Xeloda dose: 1000mg  AM, 1500mg  PM days 1-14  ASSESSMENT & PLAN:  Angela Mcpherson is a 61 y.o. female with   1. 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 She developed hematochezia and went to urgent care 05/2020. Colonoscopy 02/04/21 with Dr. Christella Hartigan showed rectosigmoid mass, with path confirming adenocarcinoma. -staging CT CAP 02/08/21 showed metastasis to lymph nodes and liver. PET on 03/04/21 confirmed hypermetabolism to rectal mass with extension into left perirectal fat, operator and periportal nodes, and two liver lesions. -FO showed MSS, low tumor mutation burden, and KRAS G12D mutation (+); baseline CEA 1,177.27 -she began Capox and bevacizumab on 03/13/21 -she was hospitalized 05/18/21 and chemo was held. She restarted Xeloda on 07/02/21 and bevacizumab on 09/16/21. She is tolerating well overall. -restaging CT CAP on 12/09/21 showed: overall stable rectosigmoid mass; no findings for recurrent hepatic metastatic disease; stable lymph nodes. Will continue current therapy. We previously discussed that surgery is not an option at this stage  -we reduced her Xeloda dose to 1000mg  AM and 1500mg  PM on 11/26/21 due to skin toxicity. She is tolerating better and well overall  -she is currently tolerating treatment well, side effects also well managed. Labs reviewed, overall  stable. Urine protein negative. Will continue current therapy    2. Symptom Management: Diarrhea -c.diff on 01/06/22 was negative -well managed with imodium and lomotil   3. Hepatitis C; Mild liver cirrhosis -CT CAP on 09/12/21 showed increase in moderately coarse nodular contour of the liver, concerning for early liver cirrhosis, and splenomegaly.  -Her husband had a hepatitis C and was treated 20 years ago. Due to no prior testing, patient was tested 10/04/21 and was positive.   4. H/o thrombosis  -admitted 05/18/21 with acute encephalopathy, colitis, abdominal aorta mural thrombosis, and thrombocytopenia. -she is on Eliquis and will continue      PLAN: -proceed with beva today as scheduled -continue Xeloda at current dose, she started this cycle today -lab, f/u, and next beva 7/17 as scheduled   No problem-specific Assessment & Plan notes found for this encounter.   SUMMARY OF ONCOLOGIC HISTORY: Oncology History Overview Note  Cancer Staging Malignant neoplasm of rectosigmoid junction [facility]) Staging form: Colon and Rectum, AJCC 8th Edition - Clinical stage from 02/04/2021: Stage IVB (cTX, cN2, cM1b) - Signed by Malachy Mood, MD on 02/12/2021 Stage prefix: Initial diagnosis    Malignant neoplasm of rectosigmoid junction (HCC)  02/04/2021 Procedure   Colonoscopy by Dr Christella Hartigan 02/04/21 IMPRESSION - Two 2 to 6 mm polyps in the sigmoid colon and in the cecum, removed with a cold snare. Resected and retrieved. - A fungating, infiltrative and ulcerated partially obstructing mass was found in the rectosigmoid colon (distal edge about 7cm from the anus). The mass was partially circumferential (involving one-half of the lumen circumference). The mass measured eight cm in length. This was biopsied with a cold forceps for histology. The  distal edge of the mass was tattooed with two submucosal injections of Spot (carbon black). Sigmoid colon felt somewhat fixed in place (large CRC or perhaps  invasion into colon from other primary site?) - Internal hemorrhoids. - The examination was otherwise normal on direct and retroflexion views.   02/04/2021 Pathology Results   Diagnosis 1. 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 - NO HIGH-GRADE DYSPLASIA OR CARCINOMA. 2. Colon, biopsy, rectosigmoid - INVASIVE ADENOCARCINOMA. - SEE NOTE.   02/04/2021 Cancer Staging   Staging form: Colon and Rectum, AJCC 8th Edition - Clinical stage from 02/04/2021: Stage IVB (cTX, cN2, cM1b) - Signed by Malachy Mood, MD on 02/12/2021 Stage prefix: Initial diagnosis   02/08/2021 Imaging   CT CHEST, ABDOMEN AND PELVIS W CONTRAST  IMPRESSION: 1. There is a large, concentric mass of the rectosigmoid junction measuring 7.8 x 5.9 x 5.2 cm. Findings are consistent with primary colon malignancy identified by colonoscopy. 2. Multiple enlarged perirectal, bilateral pelvic sidewall, and retroperitoneal lymph nodes, consistent with nodal metastatic disease. 3. There are multiple small low-attenuation lesions of the liver parenchyma, incompletely characterized although suspicious for hepatic metastatic disease. PET-CT or multiphasic contrast enhanced MRI could be used to more clearly characterize. 4. There are multiple small bilateral pulmonary nodules, measuring 3 mm and smaller, nonspecific although highly suspicious for early pulmonary metastatic disease given overall constellation of findings.   02/12/2021 Initial Diagnosis   Malignant neoplasm of rectosigmoid junction (HCC)   03/04/2021 PET scan   IMPRESSION: 1. Intense hypermetabolic activity associated with rectal mass consistent with primary colorectal carcinoma. 2. Local extension of primary tumor into the LEFT perirectal fat. 3. Hypermetabolic LEFT operator node consistent with local nodal metastasis. 4. Hypermetabolic metastasis to the LEFT and RIGHT hepatic lobe (two lesions). 5. Hypermetabolic periportal lymph node. 6. Small  pulmonary nodules without radiotracer activity. Recommend attention on follow-up.   03/13/2021 -  Chemotherapy   Patient is on Treatment Plan : COLORECTAL Bevacizumab q21d      Genetic Testing   Negative genetic testing. No pathogenic variants identified on the [facility] Multi-Cancer+RNA Panel. 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 The report date is 04/01/2021.  The Multi-Cancer Panel + RNA offered by [facility] includes sequencing and/or deletion duplication testing of the following 84 genes: AIP, ALK, APC, ATM, AXIN2,BAP1,  BARD1, BLM, BMPR1A, BRCA1, BRCA2, BRIP1, CASR, CDC73, CDH1, CDK4, CDKN1B, CDKN1C, CDKN2A (p14ARF), CDKN2A (p16INK4a), CEBPA, CHEK2, CTNNA1, DICER1, DIS3L2, EGFR (c.2369C>T, p.Thr790Met variant only), EPCAM (Deletion/duplication testing only), FH, FLCN, GATA2, GPC3, GREM1 (Promoter region deletion/duplication testing only), HOXB13 (c.251G>A, p.Gly84Glu), HRAS, KIT, MAX, MEN1, MET, MITF (c.952G>A, p.Glu318Lys variant only), MLH1, MSH2, MSH3, MSH6, MUTYH, NBN, NF1, NF2, NTHL1, PALB2, PDGFRA, PHOX2B, PMS2, POLD1, POLE, POT1, PRKAR1A, PTCH1, PTEN, RAD50, RAD51C, RAD51D, RB1, RECQL4, RET, RUNX1, SDHAF2, SDHA (sequence changes only), SDHB, SDHC, SDHD, SMAD4, SMARCA4, SMARCB1, SMARCE1, STK11, SUFU, TERC, TERT, TMEM127, TP53, TSC1, TSC2, VHL, WRN and WT1.   05/18/2021 Imaging   CT AP  IMPRESSION: 1. 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 2. Wall thickening of the ascending colon, cecum and ileum worrisome for enterocolitis. Findings may be infectious, inflammatory or ischemic given findings in the aorta. No pneumatosis or free air. 3. Small amount of ascites. 4. Rectosigmoid mass has decreased in size. Left pelvic lymph node has decreased in size. 5. Previously identified liver lesions not well characterized on this study secondary to motion artifact.   09/12/2021 Imaging   EXAM: CT CHEST, ABDOMEN,  AND PELVIS WITH  CONTRAST  IMPRESSION: 1. Some previously seen tiny bilateral pulmonary nodules are resolved and not apparent on this examination, most consistent with treatment response of small pulmonary metastases. These were too small to demonstrate PET avidity at the time of initial staging. 2. Small hypodense liver lesions are significantly diminished in size compared to prior examination dated 02/08/2021, comparison to examination dated 05/18/2021 very limited by breath motion artifact. Several of these lesions were previously PET avid. Findings are consistent with treatment response of hepatic metastatic disease. 3. Interval increase in size of a posterior pelvic sidewall or perirectal lymph node when compared to prior dated 05/18/2021, previously PET avid and consistent with worsened nodal metastatic disease. 4. Additional prominent retroperitoneal lymph nodes are unchanged, these nodes not previously PET avid. Attention on follow-up. 5. Unchanged residual concentric wall thickening of the rectosigmoid colon, consistent with treated primary malignancy. 6. There is a somewhat coarse, nodular contour of the liver, this appearance increased compared to prior examinations and perhaps reflecting pseudocirrhosis in the setting of treated hepatic metastases. 7. New splenomegaly, of uncertain significance. The portal and splenic veins are patent. 8. Coronary artery disease.      INTERVAL HISTORY:  Angela Mcpherson is here for a follow up of metastatic colon cancer. She was last seen by NP Lacie on 01/20/22. She presents to the clinic accompanied by her husband. She reports she is doing well overall. She reports some fatigue and mild skin toxicities.   All other systems were reviewed with the patient and are negative.  MEDICAL HISTORY:  Past Medical History:  Diagnosis Date   Colorectal cancer (HCC)    Family history of brain cancer    Family history of prostate cancer    Family history of stomach cancer     Heart murmur    dx at 26   MVP is stable   Hemorrhoids     SURGICAL HISTORY: Past Surgical History:  Procedure Laterality Date   NO PAST SURGERIES      I have reviewed the social history and family history with the patient and they are unchanged from previous note.  ALLERGIES:  is allergic to codeine, epinephrine (anaphylaxis), orange fruit [citrus], streptogramins, and tomato.  MEDICATIONS:  Current Outpatient Medications  Medication Sig Dispense Refill   acetaminophen (TYLENOL) 325 MG tablet Take 650 mg by mouth every 6 (six) hours as needed for moderate pain.     capecitabine (XELODA) 500 MG tablet TAKE 2 TABLETS BY MOUTH IN THE AM AND 3 TAB BY MOUTH IN PM (12 HOURS APART) FOR 14  DAYS ON THEN 7 DAYS OFF 140 tablet 0   cholecalciferol (VITAMIN D3) 25 MCG (1000 UNIT) tablet Take 1,000 Units by mouth daily.     diphenoxylate-atropine (LOMOTIL) 2.5-0.025 MG tablet Take 2 tablets by mouth 4 (four) times daily as needed for diarrhea or loose stools. 60 tablet 0   ELIQUIS 5 MG TABS tablet TAKE 1 TABLET BY MOUTH TWICE A DAY 60 tablet 2   KLOR-CON M20 20 MEQ tablet TAKE 1 TABLET BY MOUTH TWICE A DAY 180 tablet 1   loperamide (IMODIUM) 2 MG capsule Take 1 capsule (2 mg total) by mouth as needed for diarrhea or loose stools. 30 capsule 0   ondansetron (ZOFRAN) 8 MG tablet Take 1 tablet (8 mg total) by mouth 2 (two) times daily as needed for refractory nausea / vomiting. Start on day 3 after chemotherapy. 30 tablet 1   VALERIAN ROOT PO Take 1 tablet  by mouth daily.     No current facility-administered medications for this visit.   Facility-Administered Medications Ordered in Other Visits  Medication Dose Route Frequency Provider Last Rate Last Admin   bevacizumab-awwb (MVASI) 400 mg in sodium chloride 0.9 % 100 mL chemo infusion  7.5 mg/kg (Treatment Plan Recorded) Intravenous Once Malachy Mood, MD        PHYSICAL EXAMINATION: ECOG PERFORMANCE STATUS: 1 - Symptomatic but completely  ambulatory  Vitals:   02/10/22 1242  BP: 116/76  Pulse: 96  Resp: 18  Temp: 98.6 F (37 C)  SpO2: 99%   Wt Readings from Last 3 Encounters:  02/10/22 121 lb 1.6 oz (54.9 kg)  01/20/22 121 lb (54.9 kg)  01/06/22 118 lb 12.8 oz (53.9 kg)     GENERAL:alert, no distress and comfortable SKIN: skin color normal, no rashes or significant lesions EYES: normal, Conjunctiva are pink and non-injected, sclera clear  NEURO: alert & oriented x 3 with fluent speech  LABORATORY DATA:  I have reviewed the data as listed    Latest Ref Rng & Units 02/10/2022   12:05 PM 01/20/2022    9:31 AM 01/06/2022    8:19 AM  CBC  WBC 4.0 - 10.5 K/uL 3.9  3.9  5.7   Hemoglobin 12.0 - 15.0 g/dL 16.1  09.6  04.5   Hematocrit 36.0 - 46.0 % 33.9  34.3  34.1   Platelets 150 - 400 K/uL 80  88  91         Latest Ref Rng & Units 02/10/2022   12:05 PM 01/20/2022    9:31 AM 01/06/2022    8:19 AM  CMP  Glucose 70 - 99 mg/dL 409  811  914   BUN 6 - 20 mg/dL 13  12  10    Creatinine 0.44 - 1.00 mg/dL 7.82  9.56  2.13   Sodium 135 - 145 mmol/L 137  138  136   Potassium 3.5 - 5.1 mmol/L 3.7  3.8  4.1   Chloride 98 - 111 mmol/L 108  108  105   CO2 22 - 32 mmol/L 23  24  25    Calcium 8.9 - 10.3 mg/dL 9.4  9.6  9.2   Total Protein 6.5 - 8.1 g/dL 6.4  6.2  6.7   Total Bilirubin 0.3 - 1.2 mg/dL 1.3  0.7  1.2   Alkaline Phos 38 - 126 U/L 115  116  116   AST 15 - 41 U/L 36  37  34   ALT 0 - 44 U/L 29  27  26        RADIOGRAPHIC STUDIES: I have personally reviewed the radiological images as listed and agreed with the findings in the report. No results found.    No orders of the defined types were placed in this encounter.  All questions were answered. The patient knows to call the clinic with any problems, questions or concerns. No barriers to learning was detected. The total time spent in the appointment was 30 minutes.     Malachy Mood, MD 02/10/2022   I, Mickie Bail, am acting as scribe for Malachy Mood,  MD.   I have reviewed the above documentation for accuracy and completeness, and I agree with the above.

## 2022-02-11 ENCOUNTER — Telehealth: Payer: Self-pay | Admitting: Hematology

## 2022-02-11 NOTE — Telephone Encounter (Signed)
Scheduled follow-up appointment per 6/26 los. Patient is aware.

## 2022-03-03 ENCOUNTER — Inpatient Hospital Stay: Payer: 59 | Attending: Hematology

## 2022-03-03 ENCOUNTER — Encounter: Payer: Self-pay | Admitting: Hematology

## 2022-03-03 ENCOUNTER — Other Ambulatory Visit: Payer: Self-pay

## 2022-03-03 ENCOUNTER — Inpatient Hospital Stay: Payer: 59 | Admitting: Hematology

## 2022-03-03 ENCOUNTER — Inpatient Hospital Stay: Payer: 59

## 2022-03-03 VITALS — BP 119/83 | HR 99 | Temp 97.9°F | Resp 17 | Ht 64.0 in | Wt 119.9 lb

## 2022-03-03 DIAGNOSIS — C19 Malignant neoplasm of rectosigmoid junction: Secondary | ICD-10-CM | POA: Diagnosis not present

## 2022-03-03 DIAGNOSIS — Z86718 Personal history of other venous thrombosis and embolism: Secondary | ICD-10-CM | POA: Insufficient documentation

## 2022-03-03 DIAGNOSIS — Z5112 Encounter for antineoplastic immunotherapy: Secondary | ICD-10-CM | POA: Insufficient documentation

## 2022-03-03 DIAGNOSIS — Z7901 Long term (current) use of anticoagulants: Secondary | ICD-10-CM | POA: Insufficient documentation

## 2022-03-03 DIAGNOSIS — C787 Secondary malignant neoplasm of liver and intrahepatic bile duct: Secondary | ICD-10-CM | POA: Diagnosis not present

## 2022-03-03 LAB — CBC WITH DIFFERENTIAL (CANCER CENTER ONLY)
Abs Immature Granulocytes: 0.01 10*3/uL (ref 0.00–0.07)
Basophils Absolute: 0 10*3/uL (ref 0.0–0.1)
Basophils Relative: 0 %
Eosinophils Absolute: 0.1 10*3/uL (ref 0.0–0.5)
Eosinophils Relative: 1 %
HCT: 36.2 % (ref 36.0–46.0)
Hemoglobin: 12.5 g/dL (ref 12.0–15.0)
Immature Granulocytes: 0 %
Lymphocytes Relative: 29 %
Lymphs Abs: 1.3 10*3/uL (ref 0.7–4.0)
MCH: 33.8 pg (ref 26.0–34.0)
MCHC: 34.5 g/dL (ref 30.0–36.0)
MCV: 97.8 fL (ref 80.0–100.0)
Monocytes Absolute: 0.6 10*3/uL (ref 0.1–1.0)
Monocytes Relative: 13 %
Neutro Abs: 2.6 10*3/uL (ref 1.7–7.7)
Neutrophils Relative %: 57 %
Platelet Count: 71 10*3/uL — ABNORMAL LOW (ref 150–400)
RBC: 3.7 MIL/uL — ABNORMAL LOW (ref 3.87–5.11)
RDW: 22.7 % — ABNORMAL HIGH (ref 11.5–15.5)
WBC Count: 4.6 10*3/uL (ref 4.0–10.5)
nRBC: 0 % (ref 0.0–0.2)

## 2022-03-03 LAB — CMP (CANCER CENTER ONLY)
ALT: 28 U/L (ref 0–44)
AST: 34 U/L (ref 15–41)
Albumin: 3.9 g/dL (ref 3.5–5.0)
Alkaline Phosphatase: 121 U/L (ref 38–126)
Anion gap: 6 (ref 5–15)
BUN: 11 mg/dL (ref 6–20)
CO2: 23 mmol/L (ref 22–32)
Calcium: 9.7 mg/dL (ref 8.9–10.3)
Chloride: 106 mmol/L (ref 98–111)
Creatinine: 0.51 mg/dL (ref 0.44–1.00)
GFR, Estimated: >60 mL/min (ref >60)
Glucose, Bld: 108 mg/dL — ABNORMAL HIGH (ref 70–99)
Potassium: 3.8 mmol/L (ref 3.5–5.1)
Sodium: 135 mmol/L (ref 135–145)
Total Bilirubin: 1.7 mg/dL — ABNORMAL HIGH (ref 0.3–1.2)
Total Protein: 6.7 g/dL (ref 6.5–8.1)

## 2022-03-03 LAB — TOTAL PROTEIN, URINE DIPSTICK: Protein, ur: NEGATIVE mg/dL

## 2022-03-03 LAB — CEA (IN HOUSE-CHCC): CEA (CHCC-In House): 43.5 ng/mL — ABNORMAL HIGH (ref 0.00–5.00)

## 2022-03-03 MED ORDER — SODIUM CHLORIDE 0.9 % IV SOLN: Freq: Once | INTRAVENOUS | Status: AC

## 2022-03-03 MED ORDER — SODIUM CHLORIDE 0.9 % IV SOLN
7.5000 mg/kg | Freq: Once | INTRAVENOUS | Status: AC
  Administered 2022-03-03: 400 mg via INTRAVENOUS
  Filled 2022-03-03: qty 16

## 2022-03-03 NOTE — Progress Notes (Signed)
[place]   [phone] [phone]   Clinic Follow up Note   Patient Care Team: Truitt Merle, MD as Consulting Physician (Oncology) Milus Banister, MD as Attending Physician (Gastroenterology) Kyung Rudd, MD as Consulting Physician (Radiation Oncology)  Date of Service:  03/03/2022  CHIEF COMPLAINT: f/u of metastatic colon cancer  CURRENT THERAPY:  Xeloda and bevacizumab, q21d, started 03/13/21             -current Xeloda dose: 1049m AM, 1505mPM days 1-14  ASSESSMENT & PLAN:  Angela Odekirks a [age].o. female with   1. 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 She developed hematochezia and went to urgent care 05/2020. Colonoscopy 02/04/21 with Dr. JaArdis Hughshowed rectosigmoid mass, with path confirming adenocarcinoma. -staging CT CAP 02/08/21 showed metastasis to lymph nodes and liver. PET on 03/04/21 confirmed hypermetabolism to rectal mass with extension into left perirectal fat, operator and periportal nodes, and two liver lesions. -FO showed MSS, low tumor mutation burden, and KRAS G12D mutation (+); baseline CEA 1,177.27 -she began Capox and bevacizumab on 03/13/21 -she was hospitalized 05/18/21 and chemo was held. She restarted Xeloda on 07/02/21 and bevacizumab on 09/16/21. She is tolerating well overall. -restaging CT CAP on 12/09/21 showed: overall stable rectosigmoid mass; no findings for recurrent hepatic metastatic disease; stable lymph nodes. Will continue current therapy. We previously discussed that surgery is not an option at this stage  -we reduced her Xeloda dose to 100075mM and 1500m75m on 11/26/21 due to skin toxicity. She is tolerating better and well overall  -she is currently tolerating treatment well, side effects also well managed. Labs reviewed, overall stable to improved. Urine protein negative. Will  continue current therapy  -plan for restaging CT in 2-3 weeks before next cycle   2. Symptom Management: Diarrhea -c.diff on 01/06/22 was negative -using imodium, we discussed increasing to daily or every other day. I recommend she take lomotil if the imodium is not enough.   3. Hepatitis C; Mild liver cirrhosis -CT CAP on 09/12/21 showed increase in moderately coarse nodular contour of the liver, concerning for early liver cirrhosis, and splenomegaly.  -Her husband had a hepatitis C and was treated 20 years ago. Due to no prior testing, patient was tested 10/04/21 and was positive.   4. H/o thrombosis  -admitted 05/18/21 with acute encephalopathy, colitis, abdominal aorta mural thrombosis, and thrombocytopenia. -she is on Eliquis and will continue      PLAN: -proceed with beva today as scheduled -continue Xeloda at current dose, she started this cycle today -restaging CT in 2-3 weeks -lab, f/u, and next beva 8/7 as scheduled   No problem-specific Assessment & Plan notes found for this encounter.   SUMMARY OF ONCOLOGIC HISTORY: Oncology History Overview Note  Cancer Staging [facility]: Colon and Rectum, AJCC 8th Edition - Clinical stage from 02/04/2021: Stage IVB (cTX, cN2, cM1b) - Signed by FengTruitt Merle on 02/12/2021 Stage prefix: Initial diagnosis    Malignant neoplasm of rectosigmoid junction (HCC)[place]/20/2022 Procedure   Colonoscopy by Dr JacoArdis Hughs0/22 IMPRESSION - Two 2 to 6 mm polyps in the sigmoid colon and in the cecum, removed with a cold snare. Resected and retrieved. - A fungating, infiltrative and ulcerated partially obstructing mass was found in the rectosigmoid colon (distal edge about 7cm from the anus). The mass was  partially circumferential (involving one-half of the lumen circumference). The mass measured eight cm in length. This was biopsied with a cold forceps for histology. The distal edge of the mass was  tattooed with two submucosal injections of Spot (carbon black). Sigmoid colon felt somewhat fixed in place (large CRC or perhaps invasion into colon from other primary site?) - Internal hemorrhoids. - The examination was otherwise normal on direct and retroflexion views.   02/04/2021 Pathology Results   Diagnosis 1. 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 - NO HIGH-GRADE DYSPLASIA OR CARCINOMA. 2. Colon, biopsy, rectosigmoid - INVASIVE ADENOCARCINOMA. - SEE NOTE.   02/04/2021 Cancer Staging   Staging form: Colon and Rectum, AJCC 8th Edition - Clinical stage from 02/04/2021: Stage IVB (cTX, cN2, cM1b) - Signed by Truitt Merle, MD on 02/12/2021 Stage prefix: Initial diagnosis   02/08/2021 Imaging   CT CHEST, ABDOMEN AND PELVIS W CONTRAST  IMPRESSION: 1. There is a large, concentric mass of the rectosigmoid junction measuring 7.8 x 5.9 x 5.2 cm. Findings are consistent with primary colon malignancy identified by colonoscopy. 2. Multiple enlarged perirectal, bilateral pelvic sidewall, and retroperitoneal lymph nodes, consistent with nodal metastatic disease. 3. There are multiple small low-attenuation lesions of the liver parenchyma, incompletely characterized although suspicious for hepatic metastatic disease. PET-CT or multiphasic contrast enhanced MRI could be used to more clearly characterize. 4. There are multiple small bilateral pulmonary nodules, measuring 3 mm and smaller, nonspecific although highly suspicious for early pulmonary metastatic disease given overall constellation of findings.   02/12/2021 Initial Diagnosis   Malignant neoplasm of rectosigmoid junction ([place])   03/04/2021 PET scan   IMPRESSION: 1. Intense hypermetabolic activity associated with rectal mass consistent with primary colorectal carcinoma. 2. Local extension of primary tumor into the LEFT perirectal fat. 3. Hypermetabolic LEFT operator node consistent with local  nodal metastasis. 4. Hypermetabolic metastasis to the LEFT and RIGHT hepatic lobe (two lesions). 5. Hypermetabolic periportal lymph node. 6. Small pulmonary nodules without radiotracer activity. Recommend attention on follow-up.   03/13/2021 -  Chemotherapy   Patient is on Treatment Plan : COLORECTAL Bevacizumab q21d      Genetic Testing   Negative genetic testing. No pathogenic variants identified on the [facility] Multi-Cancer+RNA Panel. 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 The report date is 04/01/2021.  The Multi-Cancer Panel + RNA offered by [facility] includes sequencing and/or deletion duplication testing of the following 84 genes: AIP, ALK, APC, ATM, AXIN2,BAP1,  BARD1, BLM, BMPR1A, BRCA1, BRCA2, BRIP1, CASR, CDC73, CDH1, CDK4, CDKN1B, CDKN1C, CDKN2A (p14ARF), CDKN2A (p16INK4a), CEBPA, CHEK2, CTNNA1, DICER1, DIS3L2, EGFR (c.2369C>T, p.Thr790Met variant only), EPCAM (Deletion/duplication testing only), FH, FLCN, GATA2, GPC3, GREM1 (Promoter region deletion/duplication testing only), HOXB13 (c.251G>A, p.Gly84Glu), HRAS, KIT, MAX, MEN1, MET, MITF (c.952G>A, p.Glu318Lys variant only), MLH1, MSH2, MSH3, MSH6, MUTYH, NBN, NF1, NF2, NTHL1, PALB2, PDGFRA, PHOX2B, PMS2, POLD1, POLE, POT1, PRKAR1A, PTCH1, PTEN, RAD50, RAD51C, RAD51D, RB1, RECQL4, RET, RUNX1, SDHAF2, SDHA (sequence changes only), SDHB, SDHC, SDHD, SMAD4, SMARCA4, SMARCB1, SMARCE1, STK11, SUFU, TERC, TERT, TMEM127, TP53, TSC1, TSC2, VHL, WRN and WT1.   05/18/2021 Imaging   CT AP  IMPRESSION: 1. 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 2. Wall thickening of the ascending colon, cecum and ileum worrisome for enterocolitis. Findings may be infectious, inflammatory or ischemic given findings in the aorta. No pneumatosis or free air. 3. Small amount of ascites. 4. Rectosigmoid mass has decreased in size. Left pelvic lymph node has decreased in size.  5. Previously identified liver  lesions not well characterized on this study secondary to motion artifact.   09/12/2021 Imaging   EXAM: CT CHEST, ABDOMEN, AND PELVIS WITH CONTRAST  IMPRESSION: 1. Some previously seen tiny bilateral pulmonary nodules are resolved and not apparent on this examination, most consistent with treatment response of small pulmonary metastases. These were too small to demonstrate PET avidity at the time of initial staging. 2. Small hypodense liver lesions are significantly diminished in size compared to prior examination dated 02/08/2021, comparison to examination dated 05/18/2021 very limited by breath motion artifact. Several of these lesions were previously PET avid. Findings are consistent with treatment response of hepatic metastatic disease. 3. Interval increase in size of a posterior pelvic sidewall or perirectal lymph node when compared to prior dated 05/18/2021, previously PET avid and consistent with worsened nodal metastatic disease. 4. Additional prominent retroperitoneal lymph nodes are unchanged, these nodes not previously PET avid. Attention on follow-up. 5. Unchanged residual concentric wall thickening of the rectosigmoid colon, consistent with treated primary malignancy. 6. There is a somewhat coarse, nodular contour of the liver, this appearance increased compared to prior examinations and perhaps reflecting pseudocirrhosis in the setting of treated hepatic metastases. 7. New splenomegaly, of uncertain significance. The portal and splenic veins are patent. 8. Coronary artery disease.      INTERVAL HISTORY:  Angela Mcpherson is here for a follow up of metastatic colon cancer. She was last seen by me on 02/10/22. She presents to the clinic accompanied by her husband. She reports she is still having a lot of diarrhea, up to about 20 times a day. With imodium, she reports she has about 5 BM a day. She also reports she has seen a drop of flesh blood on the toilet tissue recently.   All  other systems were reviewed with the patient and are negative.  MEDICAL HISTORY:  Past Medical History:  Diagnosis Date   Colorectal cancer ([place])    Family history of brain cancer    Family history of prostate cancer    Family history of stomach cancer    Heart murmur    dx at 26   MVP is stable   Hemorrhoids     SURGICAL HISTORY: Past Surgical History:  Procedure Laterality Date   NO PAST SURGERIES      I have reviewed the social history and family history with the patient and they are unchanged from previous note.  ALLERGIES:  is allergic to codeine, epinephrine (anaphylaxis), orange fruit [citrus], streptogramins, and tomato.  MEDICATIONS:  Current Outpatient Medications  Medication Sig Dispense Refill   acetaminophen (TYLENOL) 325 MG tablet Take 650 mg by mouth every 6 (six) hours as needed for moderate pain.     capecitabine (XELODA) 500 MG tablet TAKE 2 TABLETS BY MOUTH IN THE AM AND 3 TAB BY MOUTH IN PM (12 HOURS APART) FOR 14  DAYS ON THEN 7 DAYS OFF 140 tablet 0   cholecalciferol (VITAMIN D3) 25 MCG (1000 UNIT) tablet Take 1,000 Units by mouth daily.     diphenoxylate-atropine (LOMOTIL) 2.5-0.025 MG tablet Take 2 tablets by mouth 4 (four) times daily as needed for diarrhea or loose stools. 60 tablet 0   ELIQUIS 5 MG TABS tablet TAKE 1 TABLET BY MOUTH TWICE A DAY 60 tablet 2   KLOR-CON M20 20 MEQ tablet TAKE 1 TABLET BY MOUTH TWICE A DAY 180 tablet 1   loperamide (IMODIUM) 2 MG capsule Take 1 capsule (2 mg total) by mouth  as needed for diarrhea or loose stools. 30 capsule 0   ondansetron (ZOFRAN) 8 MG tablet Take 1 tablet (8 mg total) by mouth 2 (two) times daily as needed for refractory nausea / vomiting. Start on day 3 after chemotherapy. 30 tablet 1   VALERIAN ROOT PO Take 1 tablet by mouth daily.     No current facility-administered medications for this visit.    PHYSICAL EXAMINATION: ECOG PERFORMANCE STATUS: 1 - Symptomatic but completely ambulatory  Vitals:    03/03/22 1215  BP: 119/83  Pulse: 99  Resp: 17  Temp: 97.9 F (36.6 C)  SpO2: 100%   Wt Readings from Last 3 Encounters:  03/03/22 119 lb 14.4 oz (54.4 kg)  02/10/22 121 lb 1.6 oz (54.9 kg)  01/20/22 121 lb (54.9 kg)     GENERAL:alert, no distress and comfortable SKIN: skin color normal, no rashes or significant lesions EYES: normal, Conjunctiva are pink and non-injected, sclera clear  NEURO: alert & oriented x 3 with fluent speech  LABORATORY DATA:  I have reviewed the data as listed    Latest Ref Rng & Units 03/03/2022   11:55 AM 02/10/2022   12:05 PM 01/20/2022    9:31 AM  CBC  WBC 4.0 - 10.5 K/uL 4.6  3.9  3.9   Hemoglobin 12.0 - 15.0 g/dL 12.5  11.4  11.4   Hematocrit 36.0 - 46.0 % 36.2  33.9  34.3   Platelets 150 - 400 K/uL 71  80  88         Latest Ref Rng & Units 03/03/2022   11:55 AM 02/10/2022   12:05 PM 01/20/2022    9:31 AM  CMP  Glucose 70 - 99 mg/dL 108  132  142   BUN 6 - 20 mg/dL 11  13  12    Creatinine 0.44 - 1.00 mg/dL 0.51  0.53  0.49   Sodium 135 - 145 mmol/L 135  137  138   Potassium 3.5 - 5.1 mmol/L 3.8  3.7  3.8   Chloride 98 - 111 mmol/L 106  108  108   CO2 22 - 32 mmol/L 23  23  24    Calcium 8.9 - 10.3 mg/dL 9.7  9.4  9.6   Total Protein 6.5 - 8.1 g/dL 6.7  6.4  6.2   Total Bilirubin 0.3 - 1.2 mg/dL 1.7  1.3  0.7   Alkaline Phos 38 - 126 U/L 121  115  116   AST 15 - 41 U/L 34  36  37   ALT 0 - 44 U/L 28  29  27        RADIOGRAPHIC STUDIES: I have personally reviewed the radiological images as listed and agreed with the findings in the report. No results found.    Orders Placed This Encounter  Procedures   CT CHEST ABDOMEN PELVIS W CONTRAST    Standing Status:   Future    Standing Expiration Date:   03/04/2023    Order Specific Question:   Is patient pregnant?    Answer:   No    Order Specific Question:   Preferred imaging location?    Answer:   [facility]    Order Specific Question:   Is Oral Contrast requested for this  exam?    Answer:   Yes, Per Radiology protocol   All questions were answered. The patient knows to call the clinic with any problems, questions or concerns. No barriers to learning was detected. The total time  spent in the appointment was 30 minutes.     Truitt Merle, MD 03/03/2022   I, Wilburn Mylar, am acting as scribe for Truitt Merle, MD.   I have reviewed the above documentation for accuracy and completeness, and I agree with the above.

## 2022-03-03 NOTE — Patient Instructions (Addendum)
[facility]  Discharge Instructions: Thank you for choosing [facility] to provide your oncology and hematology care.   If you have a lab appointment with the [facility], please go directly to the [facility] and check in at the registration area.   Wear comfortable clothing and clothing appropriate for easy access to any Portacath or PICC line.   We strive to give you quality time with your provider. You may need to reschedule your appointment if you arrive late (15 or more minutes).  Arriving late affects you and other patients whose appointments are after yours.  Also, if you miss three or more appointments without notifying the office, you may be dismissed from the clinic at the provider's discretion.      For prescription refill requests, have your pharmacy contact our office and allow 72 hours for refills to be completed.    Today you received the following chemotherapy and/or immunotherapy agents: bevacizumab-awwb      To help prevent nausea and vomiting after your treatment, we encourage you to take your nausea medication as directed.  BELOW ARE SYMPTOMS THAT SHOULD BE REPORTED IMMEDIATELY: *FEVER GREATER THAN 100.4 F (38 C) OR HIGHER *CHILLS OR SWEATING *NAUSEA AND VOMITING THAT IS NOT CONTROLLED WITH YOUR NAUSEA MEDICATION *UNUSUAL SHORTNESS OF BREATH *UNUSUAL BRUISING OR BLEEDING *URINARY PROBLEMS (pain or burning when urinating, or frequent urination) *BOWEL PROBLEMS (unusual diarrhea, constipation, pain near the anus) TENDERNESS IN MOUTH AND THROAT WITH OR WITHOUT PRESENCE OF ULCERS (sore throat, sores in mouth, or a toothache) UNUSUAL RASH, SWELLING OR PAIN  UNUSUAL VAGINAL DISCHARGE OR ITCHING   Items with * indicate a potential emergency and should be followed up as soon as possible or go to the Emergency Department if any problems should occur.  Please show the CHEMOTHERAPY ALERT CARD or IMMUNOTHERAPY ALERT CARD at  check-in to the Emergency Department and triage nurse.  Should you have questions after your visit or need to cancel or reschedule your appointment, please contact [facility]  Dept: [phone]  and follow the prompts.  Office hours are 8:00 a.m. to 4:30 p.m. Monday - Friday. Please note that voicemails left after 4:00 p.m. may not be returned until the following business day.  We are closed weekends and major holidays. You have access to a nurse at all times for urgent questions. Please call the main number to the clinic Dept: [phone] and follow the prompts.   For any non-urgent questions, you may also contact your provider using MyChart. We now offer e-Visits for anyone 3 and older to request care online for non-urgent symptoms. For details visit [email or web address].   Also download the MyChart app! Go to the app store, search "MyChart", open the app, select [facility], and log in with your MyChart username and password.  Masks are optional in the cancer centers. If you would like for your care team to wear a mask while they are taking care of you, please let them know. For doctor visits, patients may have with them one support person who is at least 60 years old. At this time, visitors are not allowed in the infusion area. Bevacizumab injection What is this medication? BEVACIZUMAB (be va SIZ yoo mab) is a monoclonal antibody. It is used to treat many types of cancer. This medicine may be used for other purposes; ask your health care provider or pharmacist if you have questions. COMMON BRAND NAME(S): Alymsys, Avastin, MVASI, Noah Charon  What should I tell my care team before I take this medication? They need to know if you have any of these conditions: diabetes heart disease high blood pressure history of coughing up blood prior anthracycline chemotherapy (e.g., doxorubicin, daunorubicin, epirubicin) recent or ongoing radiation therapy recent or  planning to have surgery stroke an unusual or allergic reaction to bevacizumab, hamster proteins, mouse proteins, other medicines, foods, dyes, or preservatives pregnant or trying to get pregnant breast-feeding How should I use this medication? This medicine is for infusion into a vein. It is given by a health care professional in a hospital or clinic setting. Talk to your pediatrician regarding the use of this medicine in children. Special care may be needed. Overdosage: If you think you have taken too much of this medicine contact a poison control center or emergency room at once. NOTE: This medicine is only for you. Do not share this medicine with others. What if I miss a dose? It is important not to miss your dose. Call your doctor or health care professional if you are unable to keep an appointment. What may interact with this medication? Interactions are not expected. This list may not describe all possible interactions. Give your health care provider a list of all the medicines, herbs, non-prescription drugs, or dietary supplements you use. Also tell them if you smoke, drink alcohol, or use illegal drugs. Some items may interact with your medicine. What should I watch for while using this medication? Your condition will be monitored carefully while you are receiving this medicine. You will need important blood work and urine testing done while you are taking this medicine. This medicine may increase your risk to bruise or bleed. Call your doctor or health care professional if you notice any unusual bleeding. Before having surgery, talk to your health care provider to make sure it is ok. This drug can increase the risk of poor healing of your surgical site or wound. You will need to stop this drug for 28 days before surgery. After surgery, wait at least 28 days before restarting this drug. Make sure the surgical site or wound is healed enough before restarting this drug. Talk to your health  care provider if questions. Do not become pregnant while taking this medicine or for 6 months after stopping it. Women should inform their doctor if they wish to become pregnant or think they might be pregnant. There is a potential for serious side effects to an unborn child. Talk to your health care professional or pharmacist for more information. Do not breast-feed an infant while taking this medicine and for 6 months after the last dose. This medicine has caused ovarian failure in some women. This medicine may interfere with the ability to have a child. You should talk to your doctor or health care professional if you are concerned about your fertility. What side effects may I notice from receiving this medication? Side effects that you should report to your doctor or health care professional as soon as possible: allergic reactions like skin rash, itching or hives, swelling of the face, lips, or tongue chest pain or chest tightness chills coughing up blood high fever seizures severe constipation signs and symptoms of bleeding such as bloody or black, tarry stools; red or dark-brown urine; spitting up blood or brown material that looks like coffee grounds; red spots on the skin; unusual bruising or bleeding from the eye, gums, or nose signs and symptoms of a blood clot such as breathing problems; chest pain;  severe, sudden headache; pain, swelling, warmth in the leg signs and symptoms of a stroke like changes in vision; confusion; trouble speaking or understanding; severe headaches; sudden numbness or weakness of the face, arm or leg; trouble walking; dizziness; loss of balance or coordination stomach pain sweating swelling of legs or ankles vomiting weight gain Side effects that usually do not require medical attention (report to your doctor or health care professional if they continue or are bothersome): back pain changes in taste decreased appetite dry skin nausea tiredness This list  may not describe all possible side effects. Call your doctor for medical advice about side effects. You may report side effects to FDA at 1-800-FDA-1088. Where should I keep my medication? This drug is given in a hospital or clinic and will not be stored at home. NOTE: This sheet is a summary. It may not cover all possible information. If you have questions about this medicine, talk to your doctor, pharmacist, or health care provider.  2023 Elsevier/Gold Standard (2021-07-05 00:00:00)

## 2022-03-10 ENCOUNTER — Other Ambulatory Visit: Payer: Self-pay

## 2022-03-13 ENCOUNTER — Other Ambulatory Visit: Payer: Self-pay | Admitting: Hematology

## 2022-03-17 ENCOUNTER — Other Ambulatory Visit (HOSPITAL_COMMUNITY): Payer: 59

## 2022-03-18 ENCOUNTER — Other Ambulatory Visit: Payer: Self-pay

## 2022-03-18 ENCOUNTER — Encounter (HOSPITAL_COMMUNITY): Payer: Self-pay

## 2022-03-18 ENCOUNTER — Ambulatory Visit (HOSPITAL_COMMUNITY)
Admission: RE | Admit: 2022-03-18 | Discharge: 2022-03-18 | Disposition: A | Discharge status: Home / Self Care | Payer: 59 | Source: Ambulatory Visit | Attending: Hematology | Admitting: Hematology

## 2022-03-18 DIAGNOSIS — C19 Malignant neoplasm of rectosigmoid junction: Secondary | ICD-10-CM | POA: Insufficient documentation

## 2022-03-18 MED ORDER — IOHEXOL 300 MG/ML  SOLN
100.0000 mL | Freq: Once | INTRAMUSCULAR | Status: AC | PRN
  Administered 2022-03-18: 100 mL via INTRAVENOUS

## 2022-03-18 MED ORDER — SODIUM CHLORIDE (PF) 0.9 % IJ SOLN
INTRAMUSCULAR | Status: AC
  Filled 2022-03-18: qty 50

## 2022-03-20 ENCOUNTER — Other Ambulatory Visit: Payer: Self-pay

## 2022-03-20 DIAGNOSIS — C19 Malignant neoplasm of rectosigmoid junction: Secondary | ICD-10-CM

## 2022-03-22 ENCOUNTER — Other Ambulatory Visit: Payer: Self-pay | Admitting: Hematology

## 2022-03-24 ENCOUNTER — Inpatient Hospital Stay: Payer: 59 | Attending: Hematology

## 2022-03-24 ENCOUNTER — Inpatient Hospital Stay: Payer: 59

## 2022-03-24 ENCOUNTER — Encounter: Payer: Self-pay | Admitting: Hematology

## 2022-03-24 ENCOUNTER — Inpatient Hospital Stay (HOSPITAL_BASED_OUTPATIENT_CLINIC_OR_DEPARTMENT_OTHER): Payer: 59 | Admitting: Hematology

## 2022-03-24 VITALS — BP 107/70 | HR 75 | Temp 98.1°F | Resp 18 | Ht 64.0 in | Wt 117.3 lb

## 2022-03-24 VITALS — BP 113/63 | HR 78 | Temp 97.5°F | Resp 16

## 2022-03-24 DIAGNOSIS — C19 Malignant neoplasm of rectosigmoid junction: Secondary | ICD-10-CM | POA: Diagnosis present

## 2022-03-24 DIAGNOSIS — Z86718 Personal history of other venous thrombosis and embolism: Secondary | ICD-10-CM | POA: Diagnosis not present

## 2022-03-24 DIAGNOSIS — Z7901 Long term (current) use of anticoagulants: Secondary | ICD-10-CM | POA: Diagnosis not present

## 2022-03-24 DIAGNOSIS — Z5112 Encounter for antineoplastic immunotherapy: Secondary | ICD-10-CM | POA: Diagnosis present

## 2022-03-24 DIAGNOSIS — Z8619 Personal history of other infectious and parasitic diseases: Secondary | ICD-10-CM | POA: Insufficient documentation

## 2022-03-24 LAB — CMP (CANCER CENTER ONLY)
ALT: 19 U/L (ref 0–44)
AST: 28 U/L (ref 15–41)
Albumin: 3.7 g/dL (ref 3.5–5.0)
Alkaline Phosphatase: 114 U/L (ref 38–126)
Anion gap: 5 (ref 5–15)
BUN: 7 mg/dL (ref 6–20)
CO2: 25 mmol/L (ref 22–32)
Calcium: 9.1 mg/dL (ref 8.9–10.3)
Chloride: 105 mmol/L (ref 98–111)
Creatinine: 0.55 mg/dL (ref 0.44–1.00)
GFR, Estimated: >60 mL/min (ref >60)
Glucose, Bld: 105 mg/dL — ABNORMAL HIGH (ref 70–99)
Potassium: 3.6 mmol/L (ref 3.5–5.1)
Sodium: 135 mmol/L (ref 135–145)
Total Bilirubin: 1.8 mg/dL — ABNORMAL HIGH (ref 0.3–1.2)
Total Protein: 6.7 g/dL (ref 6.5–8.1)

## 2022-03-24 LAB — CBC WITH DIFFERENTIAL (CANCER CENTER ONLY)
Abs Immature Granulocytes: 0.01 10*3/uL (ref 0.00–0.07)
Basophils Absolute: 0 10*3/uL (ref 0.0–0.1)
Basophils Relative: 0 %
Eosinophils Absolute: 0.1 10*3/uL (ref 0.0–0.5)
Eosinophils Relative: 1 %
HCT: 34.3 % — ABNORMAL LOW (ref 36.0–46.0)
Hemoglobin: 12 g/dL (ref 12.0–15.0)
Immature Granulocytes: 0 %
Lymphocytes Relative: 20 %
Lymphs Abs: 1 10*3/uL (ref 0.7–4.0)
MCH: 34 pg (ref 26.0–34.0)
MCHC: 35 g/dL (ref 30.0–36.0)
MCV: 97.2 fL (ref 80.0–100.0)
Monocytes Absolute: 0.6 10*3/uL (ref 0.1–1.0)
Monocytes Relative: 11 %
Neutro Abs: 3.4 10*3/uL (ref 1.7–7.7)
Neutrophils Relative %: 68 %
Platelet Count: 82 10*3/uL — ABNORMAL LOW (ref 150–400)
RBC: 3.53 MIL/uL — ABNORMAL LOW (ref 3.87–5.11)
RDW: 23 % — ABNORMAL HIGH (ref 11.5–15.5)
WBC Count: 5.1 10*3/uL (ref 4.0–10.5)
nRBC: 0 % (ref 0.0–0.2)

## 2022-03-24 LAB — TOTAL PROTEIN, URINE DIPSTICK: Protein, ur: <30 mg/dL — AB

## 2022-03-24 LAB — CEA (IN HOUSE-CHCC): CEA (CHCC-In House): 115.4 ng/mL — ABNORMAL HIGH (ref 0.00–5.00)

## 2022-03-24 MED ORDER — SODIUM CHLORIDE 0.9 % IV SOLN
7.5000 mg/kg | Freq: Once | INTRAVENOUS | Status: AC
  Administered 2022-03-24: 400 mg via INTRAVENOUS
  Filled 2022-03-24: qty 16

## 2022-03-24 MED ORDER — SODIUM CHLORIDE 0.9 % IV SOLN: Freq: Once | INTRAVENOUS | Status: AC

## 2022-03-24 MED ORDER — CAPECITABINE 500 MG PO TABS: ORAL_TABLET | ORAL | 1 refills | Status: AC

## 2022-03-24 NOTE — Progress Notes (Signed)
Per Dr. Burr Medico ok to treat with PLT 82.

## 2022-03-24 NOTE — Patient Instructions (Signed)
[facility]  Discharge Instructions: Thank you for choosing [facility] to provide your oncology and hematology care.   If you have a lab appointment with the [facility], please go directly to the [facility] and check in at the registration area.   Wear comfortable clothing and clothing appropriate for easy access to any Portacath or PICC line.   We strive to give you quality time with your provider. You may need to reschedule your appointment if you arrive late (15 or more minutes).  Arriving late affects you and other patients whose appointments are after yours.  Also, if you miss three or more appointments without notifying the office, you may be dismissed from the clinic at the provider's discretion.      For prescription refill requests, have your pharmacy contact our office and allow 72 hours for refills to be completed.    Today you received the following chemotherapy and/or immunotherapy agents: bevacizumab-awwb      To help prevent nausea and vomiting after your treatment, we encourage you to take your nausea medication as directed.  BELOW ARE SYMPTOMS THAT SHOULD BE REPORTED IMMEDIATELY: *FEVER GREATER THAN 100.4 F (38 C) OR HIGHER *CHILLS OR SWEATING *NAUSEA AND VOMITING THAT IS NOT CONTROLLED WITH YOUR NAUSEA MEDICATION *UNUSUAL SHORTNESS OF BREATH *UNUSUAL BRUISING OR BLEEDING *URINARY PROBLEMS (pain or burning when urinating, or frequent urination) *BOWEL PROBLEMS (unusual diarrhea, constipation, pain near the anus) TENDERNESS IN MOUTH AND THROAT WITH OR WITHOUT PRESENCE OF ULCERS (sore throat, sores in mouth, or a toothache) UNUSUAL RASH, SWELLING OR PAIN  UNUSUAL VAGINAL DISCHARGE OR ITCHING   Items with * indicate a potential emergency and should be followed up as soon as possible or go to the Emergency Department if any problems should occur.  Please show the CHEMOTHERAPY ALERT CARD or IMMUNOTHERAPY ALERT CARD at  check-in to the Emergency Department and triage nurse.  Should you have questions after your visit or need to cancel or reschedule your appointment, please contact [facility]  Dept: [phone]  and follow the prompts.  Office hours are 8:00 a.m. to 4:30 p.m. Monday - Friday. Please note that voicemails left after 4:00 p.m. may not be returned until the following business day.  We are closed weekends and major holidays. You have access to a nurse at all times for urgent questions. Please call the main number to the clinic Dept: [phone] and follow the prompts.   For any non-urgent questions, you may also contact your provider using MyChart. We now offer e-Visits for anyone 38 and older to request care online for non-urgent symptoms. For details visit [email or web address].   Also download the MyChart app! Go to the app store, search "MyChart", open the app, select [facility], and log in with your MyChart username and password.  Masks are optional in the cancer centers. If you would like for your care team to wear a mask while they are taking care of you, please let them know. For doctor visits, patients may have with them one support person who is at least 60 years old. At this time, visitors are not allowed in the infusion area. Bevacizumab injection What is this medication? BEVACIZUMAB (be va SIZ yoo mab) is a monoclonal antibody. It is used to treat many types of cancer. This medicine may be used for other purposes; ask your health care provider or pharmacist if you have questions. COMMON BRAND NAME(S): Alymsys, Avastin, MVASI, Noah Charon  What should I tell my care team before I take this medication? They need to know if you have any of these conditions: diabetes heart disease high blood pressure history of coughing up blood prior anthracycline chemotherapy (e.g., doxorubicin, daunorubicin, epirubicin) recent or ongoing radiation therapy recent or  planning to have surgery stroke an unusual or allergic reaction to bevacizumab, hamster proteins, mouse proteins, other medicines, foods, dyes, or preservatives pregnant or trying to get pregnant breast-feeding How should I use this medication? This medicine is for infusion into a vein. It is given by a health care professional in a hospital or clinic setting. Talk to your pediatrician regarding the use of this medicine in children. Special care may be needed. Overdosage: If you think you have taken too much of this medicine contact a poison control center or emergency room at once. NOTE: This medicine is only for you. Do not share this medicine with others. What if I miss a dose? It is important not to miss your dose. Call your doctor or health care professional if you are unable to keep an appointment. What may interact with this medication? Interactions are not expected. This list may not describe all possible interactions. Give your health care provider a list of all the medicines, herbs, non-prescription drugs, or dietary supplements you use. Also tell them if you smoke, drink alcohol, or use illegal drugs. Some items may interact with your medicine. What should I watch for while using this medication? Your condition will be monitored carefully while you are receiving this medicine. You will need important blood work and urine testing done while you are taking this medicine. This medicine may increase your risk to bruise or bleed. Call your doctor or health care professional if you notice any unusual bleeding. Before having surgery, talk to your health care provider to make sure it is ok. This drug can increase the risk of poor healing of your surgical site or wound. You will need to stop this drug for 28 days before surgery. After surgery, wait at least 28 days before restarting this drug. Make sure the surgical site or wound is healed enough before restarting this drug. Talk to your health  care provider if questions. Do not become pregnant while taking this medicine or for 6 months after stopping it. Women should inform their doctor if they wish to become pregnant or think they might be pregnant. There is a potential for serious side effects to an unborn child. Talk to your health care professional or pharmacist for more information. Do not breast-feed an infant while taking this medicine and for 6 months after the last dose. This medicine has caused ovarian failure in some women. This medicine may interfere with the ability to have a child. You should talk to your doctor or health care professional if you are concerned about your fertility. What side effects may I notice from receiving this medication? Side effects that you should report to your doctor or health care professional as soon as possible: allergic reactions like skin rash, itching or hives, swelling of the face, lips, or tongue chest pain or chest tightness chills coughing up blood high fever seizures severe constipation signs and symptoms of bleeding such as bloody or black, tarry stools; red or dark-brown urine; spitting up blood or brown material that looks like coffee grounds; red spots on the skin; unusual bruising or bleeding from the eye, gums, or nose signs and symptoms of a blood clot such as breathing problems; chest pain;  severe, sudden headache; pain, swelling, warmth in the leg signs and symptoms of a stroke like changes in vision; confusion; trouble speaking or understanding; severe headaches; sudden numbness or weakness of the face, arm or leg; trouble walking; dizziness; loss of balance or coordination stomach pain sweating swelling of legs or ankles vomiting weight gain Side effects that usually do not require medical attention (report to your doctor or health care professional if they continue or are bothersome): back pain changes in taste decreased appetite dry skin nausea tiredness This list  may not describe all possible side effects. Call your doctor for medical advice about side effects. You may report side effects to FDA at 1-800-FDA-1088. Where should I keep my medication? This drug is given in a hospital or clinic and will not be stored at home. NOTE: This sheet is a summary. It may not cover all possible information. If you have questions about this medicine, talk to your doctor, pharmacist, or health care provider.  2023 Elsevier/Gold Standard (2021-07-05 00:00:00)

## 2022-03-24 NOTE — Progress Notes (Signed)
Angela Mcpherson   [phone] [phone]) [phone]   Clinic Follow up Note   Patient Care Team: Default, Provider, MD as PCP - General Truitt Merle, MD as Consulting Physician (Oncology) Milus Banister, MD as Attending Physician (Gastroenterology) Kyung Rudd, MD as Consulting Physician (Radiation Oncology)  Date of Service:  03/24/2022  CHIEF COMPLAINT: f/u of metastatic colon cancer  CURRENT THERAPY:  Xeloda and bevacizumab, q21d, started 03/13/21             -current Xeloda dose: 1037m AM, 15041mPM days 1-14  ASSESSMENT & PLAN:  Angela Mcpherson a 89.o. female with   1. 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 She developed hematochezia and went to urgent care 05/2020. Colonoscopy 02/04/21 with Dr. JaArdis Hughshowed rectosigmoid mass, with path confirming adenocarcinoma. -staging CT CAP 02/08/21 showed metastasis to lymph nodes and liver. PET on 03/04/21 confirmed hypermetabolism to rectal mass with extension into left perirectal fat, operator and periportal nodes, and two liver lesions. -FO showed MSS, low tumor mutation burden, and KRAS G12D mutation (+); baseline CEA 1,177.27 -she began Capox and bevacizumab on 03/13/21 -she was hospitalized 05/18/21 and chemo was held. She restarted Xeloda on 07/02/21 and bevacizumab on 09/16/21. She is tolerating well overall.  -We reduced her Xeloda dose to 100020mM and 1500m76m on 11/26/21 due to skin toxicity. She is tolerating better and well overall  -restaging CT CAP on 03/18/22 showed: unchanged primary mass and retroperitoneal lymph nodes; enlargement of a left mesorectal or pelvic sidewall lymph node (from 2.9cm to 3.7cm); no evidence of metastatic disease in chest; mild gallbladder wall thickening with new mild biliary ductal dilatation. I reviewed the results and images with them today -due to  her disease progression, I recommend adding back low-dose oxaliplatin. We will monitor her closely to prevent severe side effects. I discussed alternative options such as iriotecan or FOLFIRI. Which we will reserve for future.  -she would like to start CAPOX in 3 weeks  -labs reviewed, overall stable. Urine protein <30. Will proceed with same treatment today as scheduled, plan to add oxali next treatment.   2. Symptom Management: Diarrhea -c.diff on 01/06/22 was negative -using imodium, we discussed increasing to daily or every other day. I recommend she take lomotil if the imodium is not enough.   3. Hepatitis C; Mild liver cirrhosis -CT CAP on 09/12/21 showed increase in moderately coarse nodular contour of the liver, concerning for early liver cirrhosis, and splenomegaly.  -Her husband had a hepatitis C and was treated 20 years ago. Due to no prior testing, patient was tested 10/04/21 and was positive.   4. H/o thrombosis  -admitted 05/18/21 with acute encephalopathy, colitis, abdominal aorta mural thrombosis, and thrombocytopenia. -she is on Eliquis and will continue      PLAN: -proceed with beva today as scheduled -continue Xeloda at current dose, she started this cycle today -lab, f/u, and oxali/beva in 3 weeks   No problem-specific Assessment & Plan notes found for this encounter.   SUMMARY OF ONCOLOGIC HISTORY: Oncology History Overview Note  Cancer Staging Malignant ne[facility] of rectosigmoid junction ([facility] form: Colon and Rectum, AJCC 8th Edition - Clinical stage from 02/04/2021: Stage IVB (cTX, cN2, cM1b) - Signed by FengTruitt Merle on 02/12/2021 Stage prefix: Initial diagnosis    Malignant neoplasm of rectosigmoid junction (HCC)[place]/20/2022 Procedure   Colonoscopy by Dr  Ardis Hughs 02/04/21 IMPRESSION - Two 2 to 6 mm polyps in the sigmoid colon and in the cecum, removed with a cold snare. Resected and retrieved. - A fungating, infiltrative and ulcerated partially  obstructing mass was found in the rectosigmoid colon (distal edge about 7cm from the anus). The mass was partially circumferential (involving one-half of the lumen circumference). The mass measured eight cm in length. This was biopsied with a cold forceps for histology. The distal edge of the mass was tattooed with two submucosal injections of Spot (carbon black). Sigmoid colon felt somewhat fixed in place (large CRC or perhaps invasion into colon from other primary site?) - Internal hemorrhoids. - The examination was otherwise normal on direct and retroflexion views.   02/04/2021 Pathology Results   Diagnosis 1. 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 - NO HIGH-GRADE DYSPLASIA OR CARCINOMA. 2. Colon, biopsy, rectosigmoid - INVASIVE ADENOCARCINOMA. - SEE NOTE.   02/04/2021 Cancer Staging   Staging form: Colon and Rectum, AJCC 8th Edition - Clinical stage from 02/04/2021: Stage IVB (cTX, cN2, cM1b) - Signed by Truitt Merle, MD on 02/12/2021 Stage prefix: Initial diagnosis   02/08/2021 Imaging   CT CHEST, ABDOMEN AND PELVIS W CONTRAST  IMPRESSION: 1. There is a large, concentric mass of the rectosigmoid junction measuring 7.8 x 5.9 x 5.2 cm. Findings are consistent with primary colon malignancy identified by colonoscopy. 2. Multiple enlarged perirectal, bilateral pelvic sidewall, and retroperitoneal lymph nodes, consistent with nodal metastatic disease. 3. There are multiple small low-attenuation lesions of the liver parenchyma, incompletely characterized although suspicious for hepatic metastatic disease. PET-CT or multiphasic contrast enhanced MRI could be used to more clearly characterize. 4. There are multiple small bilateral pulmonary nodules, measuring 3 mm and smaller, nonspecific although highly suspicious for early pulmonary metastatic disease given overall constellation of findings.   02/12/2021 Initial Diagnosis   Malignant neoplasm of rectosigmoid  junction ([facility])   03/04/2021 PET scan   IMPRESSION: 1. Intense hypermetabolic activity associated with rectal mass consistent with primary colorectal carcinoma. 2. Local extension of primary tumor into the LEFT perirectal fat. 3. Hypermetabolic LEFT operator node consistent with local nodal metastasis. 4. Hypermetabolic metastasis to the LEFT and RIGHT hepatic lobe (two lesions). 5. Hypermetabolic periportal lymph node. 6. Small pulmonary nodules without radiotracer activity. Recommend attention on follow-up.   03/13/2021 -  Chemotherapy   Patient is on Treatment Plan : COLORECTAL Bevacizumab q21d      Genetic Testing   Negative genetic testing. No pathogenic variants identified on the [facility] Multi-Cancer+RNA Panel. 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 The report date is 04/01/2021.  The Multi-Cancer Panel + RNA offered by [facility] includes sequencing and/or deletion duplication testing of the following 84 genes: AIP, ALK, APC, ATM, AXIN2,BAP1,  BARD1, BLM, BMPR1A, BRCA1, BRCA2, BRIP1, CASR, CDC73, CDH1, CDK4, CDKN1B, CDKN1C, CDKN2A (p14ARF), CDKN2A (p16INK4a), CEBPA, CHEK2, CTNNA1, DICER1, DIS3L2, EGFR (c.2369C>T, p.Thr790Met variant only), EPCAM (Deletion/duplication testing only), FH, FLCN, GATA2, GPC3, GREM1 (Promoter region deletion/duplication testing only), HOXB13 (c.251G>A, p.Gly84Glu), HRAS, KIT, MAX, MEN1, MET, MITF (c.952G>A, p.Glu318Lys variant only), MLH1, MSH2, MSH3, MSH6, MUTYH, NBN, NF1, NF2, NTHL1, PALB2, PDGFRA, PHOX2B, PMS2, POLD1, POLE, POT1, PRKAR1A, PTCH1, PTEN, RAD50, RAD51C, RAD51D, RB1, RECQL4, RET, RUNX1, SDHAF2, SDHA (sequence changes only), SDHB, SDHC, SDHD, SMAD4, SMARCA4, SMARCB1, SMARCE1, STK11, SUFU, TERC, TERT, TMEM127, TP53, TSC1, TSC2, VHL, WRN and WT1.   05/18/2021 Imaging   CT AP  IMPRESSION: 1. There is new noncalcified thrombus in the infrarenal abdominal aorta causing severe  stenosis. 2. Wall thickening of the ascending colon, cecum and ileum  worrisome for enterocolitis. Findings may be infectious, inflammatory or ischemic given findings in the aorta. No pneumatosis or free air. 3. Small amount of ascites. 4. Rectosigmoid mass has decreased in size. Left pelvic lymph node has decreased in size. 5. Previously identified liver lesions not well characterized on this study secondary to motion artifact.   09/12/2021 Imaging   EXAM: CT CHEST, ABDOMEN, AND PELVIS WITH CONTRAST  IMPRESSION: 1. Some previously seen tiny bilateral pulmonary nodules are resolved and not apparent on this examination, most consistent with treatment response of small pulmonary metastases. These were too small to demonstrate PET avidity at the time of initial staging. 2. Small hypodense liver lesions are significantly diminished in size compared to prior examination dated 02/08/2021, comparison to examination dated 05/18/2021 very limited by breath motion artifact. Several of these lesions were previously PET avid. Findings are consistent with treatment response of hepatic metastatic disease. 3. Interval increase in size of a posterior pelvic sidewall or perirectal lymph node when compared to prior dated 05/18/2021, previously PET avid and consistent with worsened nodal metastatic disease. 4. Additional prominent retroperitoneal lymph nodes are unchanged, these nodes not previously PET avid. Attention on follow-up. 5. Unchanged residual concentric wall thickening of the rectosigmoid colon, consistent with treated primary malignancy. 6. There is a somewhat coarse, nodular contour of the liver, this appearance increased compared to prior examinations and perhaps reflecting pseudocirrhosis in the setting of treated hepatic metastases. 7. New splenomegaly, of uncertain significance. The portal and splenic veins are patent. 8. Coronary artery disease.      INTERVAL HISTORY:  Angela Mcpherson is here for a follow up of metastatic colon cancer. She was last seen by me on  03/03/22. She presents to the clinic accompanied by her husband. She reports she is doing well overall. She notes she took a vacation that she greatly enjoyed.   All other systems were reviewed with the patient and are negative.  MEDICAL HISTORY:  Past Medical History:  Diagnosis Date   Colorectal cancer ([place])    Family history of brain cancer    Family history of prostate cancer    Family history of stomach cancer    Heart murmur    dx at 26   MVP is stable   Hemorrhoids     SURGICAL HISTORY: Past Surgical History:  Procedure Laterality Date   NO PAST SURGERIES      I have reviewed the social history and family history with the patient and they are unchanged from previous note.  ALLERGIES:  is allergic to codeine, epinephrine (anaphylaxis), orange fruit [citrus], streptogramins, and tomato.  MEDICATIONS:  Current Outpatient Medications  Medication Sig Dispense Refill   acetaminophen (TYLENOL) 325 MG tablet Take 650 mg by mouth every 6 (six) hours as needed for moderate pain.     capecitabine (XELODA) 500 MG tablet TAKE 2 TABLETS EVERY 12 HOURS  FOR 14 DAYS ON THEN 7 DAYS OFF, TAKE WITH MEALS 56 tablet 1   cholecalciferol (VITAMIN D3) 25 MCG (1000 UNIT) tablet Take 1,000 Units by mouth daily.     diphenoxylate-atropine (LOMOTIL) 2.5-0.025 MG tablet Take 2 tablets by mouth 4 (four) times daily as needed for diarrhea or loose stools. 60 tablet 0   ELIQUIS 5 MG TABS tablet TAKE 1 TABLET BY MOUTH TWICE A DAY 60 tablet 2   KLOR-CON M20 20 MEQ tablet TAKE 1 TABLET BY MOUTH TWICE A DAY 180 tablet 1  loperamide (IMODIUM) 2 MG capsule Take 1 capsule (2 mg total) by mouth as needed for diarrhea or loose stools. 30 capsule 0   ondansetron (ZOFRAN) 8 MG tablet Take 1 tablet (8 mg total) by mouth 2 (two) times daily as needed for refractory nausea / vomiting. Start on day 3 after chemotherapy. 30 tablet 1   VALERIAN ROOT PO Take 1 tablet by mouth daily.     No current facility-administered  medications for this visit.    PHYSICAL EXAMINATION: ECOG PERFORMANCE STATUS: 1 - Symptomatic but completely ambulatory  Vitals:   03/24/22 1155  BP: 107/70  Pulse: 75  Resp: 18  Temp: 98.1 F (36.7 C)  SpO2: 98%   Wt Readings from Last 3 Encounters:  03/24/22 117 lb 4.8 oz (53.2 kg)  03/03/22 119 lb 14.4 oz (54.4 kg)  02/10/22 121 lb 1.6 oz (54.9 kg)     GENERAL:alert, no distress and comfortable SKIN: skin color normal, no rashes or significant lesions EYES: normal, Conjunctiva are pink and non-injected, sclera clear  NEURO: alert & oriented x 3 with fluent speech  LABORATORY DATA:  I have reviewed the data as listed    Latest Ref Rng & Units 03/24/2022   10:29 AM 03/03/2022   11:55 AM 02/10/2022   12:05 PM  CBC  WBC 4.0 - 10.5 K/uL 5.1  4.6  3.9   Hemoglobin 12.0 - 15.0 g/dL 12.0  12.5  11.4   Hematocrit 36.0 - 46.0 % 34.3  36.2  33.9   Platelets 150 - 400 K/uL 82  71  80         Latest Ref Rng & Units 03/24/2022   10:29 AM 03/03/2022   11:55 AM 02/10/2022   12:05 PM  CMP  Glucose 70 - 99 mg/dL 105  108  132   BUN 6 - 20 mg/dL 7  11  13    Creatinine 0.44 - 1.00 mg/dL 0.55  0.51  0.53   Sodium 135 - 145 mmol/L 135  135  137   Potassium 3.5 - 5.1 mmol/L 3.6  3.8  3.7   Chloride 98 - 111 mmol/L 105  106  108   CO2 22 - 32 mmol/L 25  23  23    Calcium 8.9 - 10.3 mg/dL 9.1  9.7  9.4   Total Protein 6.5 - 8.1 g/dL 6.7  6.7  6.4   Total Bilirubin 0.3 - 1.2 mg/dL 1.8  1.7  1.3   Alkaline Phos 38 - 126 U/L 114  121  115   AST 15 - 41 U/L 28  34  36   ALT 0 - 44 U/L 19  28  29        RADIOGRAPHIC STUDIES: I have personally reviewed the radiological images as listed and agreed with the findings in the report. No results found.    No orders of the defined types were placed in this encounter.  All questions were answered. The patient knows to call the clinic with any problems, questions or concerns. No barriers to learning was detected. The total time spent in the  appointment was 30 minutes.     Truitt Merle, MD 03/24/2022   I, Wilburn Mylar, am acting as scribe for Truitt Merle, MD.   I have reviewed the above documentation for accuracy and completeness, and I agree with the above.

## 2022-03-25 ENCOUNTER — Telehealth: Payer: Self-pay | Admitting: Hematology

## 2022-03-25 ENCOUNTER — Other Ambulatory Visit: Payer: Self-pay

## 2022-03-25 NOTE — Telephone Encounter (Signed)
Per 8/7 los called and left message for pt about appointments

## 2022-03-26 ENCOUNTER — Other Ambulatory Visit: Payer: Self-pay

## 2022-03-27 ENCOUNTER — Other Ambulatory Visit: Payer: Self-pay

## 2022-03-29 ENCOUNTER — Other Ambulatory Visit: Payer: Self-pay

## 2022-04-02 ENCOUNTER — Encounter: Payer: Self-pay | Admitting: Hematology

## 2022-04-02 ENCOUNTER — Encounter: Payer: Self-pay | Admitting: Nurse Practitioner

## 2022-04-03 ENCOUNTER — Emergency Department (HOSPITAL_COMMUNITY): Payer: 59

## 2022-04-03 ENCOUNTER — Encounter (HOSPITAL_COMMUNITY): Payer: Self-pay | Admitting: Emergency Medicine

## 2022-04-03 ENCOUNTER — Telehealth: Payer: Self-pay

## 2022-04-03 ENCOUNTER — Inpatient Hospital Stay (HOSPITAL_COMMUNITY)
Admission: EM | Admit: 2022-04-03 | Discharge: 2022-04-25 | DRG: 853 | Disposition: A | Discharge status: Hospice | Payer: 59 | Attending: Internal Medicine | Admitting: Internal Medicine

## 2022-04-03 DIAGNOSIS — Z800 Family history of malignant neoplasm of digestive organs: Secondary | ICD-10-CM

## 2022-04-03 DIAGNOSIS — Z79899 Other long term (current) drug therapy: Secondary | ICD-10-CM

## 2022-04-03 DIAGNOSIS — R197 Diarrhea, unspecified: Secondary | ICD-10-CM

## 2022-04-03 DIAGNOSIS — C19 Malignant neoplasm of rectosigmoid junction: Secondary | ICD-10-CM | POA: Diagnosis present

## 2022-04-03 DIAGNOSIS — R54 Age-related physical debility: Secondary | ICD-10-CM | POA: Diagnosis present

## 2022-04-03 DIAGNOSIS — I7410 Embolism and thrombosis of unspecified parts of aorta: Secondary | ICD-10-CM | POA: Diagnosis not present

## 2022-04-03 DIAGNOSIS — R188 Other ascites: Secondary | ICD-10-CM | POA: Diagnosis present

## 2022-04-03 DIAGNOSIS — K651 Peritoneal abscess: Secondary | ICD-10-CM | POA: Diagnosis present

## 2022-04-03 DIAGNOSIS — Z515 Encounter for palliative care: Secondary | ICD-10-CM

## 2022-04-03 DIAGNOSIS — D6959 Other secondary thrombocytopenia: Secondary | ICD-10-CM | POA: Diagnosis present

## 2022-04-03 DIAGNOSIS — I959 Hypotension, unspecified: Secondary | ICD-10-CM | POA: Diagnosis not present

## 2022-04-03 DIAGNOSIS — A419 Sepsis, unspecified organism: Secondary | ICD-10-CM | POA: Diagnosis not present

## 2022-04-03 DIAGNOSIS — N739 Female pelvic inflammatory disease, unspecified: Secondary | ICD-10-CM | POA: Diagnosis present

## 2022-04-03 DIAGNOSIS — K659 Peritonitis, unspecified: Secondary | ICD-10-CM | POA: Diagnosis present

## 2022-04-03 DIAGNOSIS — Z7901 Long term (current) use of anticoagulants: Secondary | ICD-10-CM

## 2022-04-03 DIAGNOSIS — R7881 Bacteremia: Secondary | ICD-10-CM | POA: Diagnosis not present

## 2022-04-03 DIAGNOSIS — G629 Polyneuropathy, unspecified: Secondary | ICD-10-CM | POA: Diagnosis present

## 2022-04-03 DIAGNOSIS — E876 Hypokalemia: Secondary | ICD-10-CM | POA: Diagnosis not present

## 2022-04-03 DIAGNOSIS — A4151 Sepsis due to Escherichia coli [E. coli]: Principal | ICD-10-CM | POA: Diagnosis present

## 2022-04-03 DIAGNOSIS — Z885 Allergy status to narcotic agent status: Secondary | ICD-10-CM

## 2022-04-03 DIAGNOSIS — K5669 Other partial intestinal obstruction: Secondary | ICD-10-CM

## 2022-04-03 DIAGNOSIS — D649 Anemia, unspecified: Secondary | ICD-10-CM

## 2022-04-03 DIAGNOSIS — D696 Thrombocytopenia, unspecified: Secondary | ICD-10-CM | POA: Diagnosis present

## 2022-04-03 DIAGNOSIS — Z681 Body mass index (BMI) 19 or less, adult: Secondary | ICD-10-CM

## 2022-04-03 DIAGNOSIS — K631 Perforation of intestine (nontraumatic): Secondary | ICD-10-CM | POA: Diagnosis present

## 2022-04-03 DIAGNOSIS — C186 Malignant neoplasm of descending colon: Secondary | ICD-10-CM | POA: Diagnosis present

## 2022-04-03 DIAGNOSIS — N730 Acute parametritis and pelvic cellulitis: Secondary | ICD-10-CM | POA: Diagnosis not present

## 2022-04-03 DIAGNOSIS — D539 Nutritional anemia, unspecified: Secondary | ICD-10-CM | POA: Diagnosis present

## 2022-04-03 DIAGNOSIS — F17210 Nicotine dependence, cigarettes, uncomplicated: Secondary | ICD-10-CM | POA: Diagnosis present

## 2022-04-03 DIAGNOSIS — D689 Coagulation defect, unspecified: Secondary | ICD-10-CM | POA: Diagnosis not present

## 2022-04-03 DIAGNOSIS — G9341 Metabolic encephalopathy: Secondary | ICD-10-CM | POA: Diagnosis not present

## 2022-04-03 DIAGNOSIS — R778 Other specified abnormalities of plasma proteins: Secondary | ICD-10-CM

## 2022-04-03 DIAGNOSIS — R6520 Severe sepsis without septic shock: Secondary | ICD-10-CM | POA: Diagnosis not present

## 2022-04-03 DIAGNOSIS — Z91018 Allergy to other foods: Secondary | ICD-10-CM

## 2022-04-03 DIAGNOSIS — K529 Noninfective gastroenteritis and colitis, unspecified: Secondary | ICD-10-CM | POA: Diagnosis not present

## 2022-04-03 DIAGNOSIS — R338 Other retention of urine: Secondary | ICD-10-CM | POA: Diagnosis not present

## 2022-04-03 DIAGNOSIS — Z85048 Personal history of other malignant neoplasm of rectum, rectosigmoid junction, and anus: Secondary | ICD-10-CM

## 2022-04-03 DIAGNOSIS — Z66 Do not resuscitate: Secondary | ICD-10-CM | POA: Diagnosis present

## 2022-04-03 DIAGNOSIS — Z9221 Personal history of antineoplastic chemotherapy: Secondary | ICD-10-CM

## 2022-04-03 DIAGNOSIS — E440 Moderate protein-calorie malnutrition: Secondary | ICD-10-CM | POA: Diagnosis present

## 2022-04-03 DIAGNOSIS — I5A Non-ischemic myocardial injury (non-traumatic): Secondary | ICD-10-CM | POA: Insufficient documentation

## 2022-04-03 DIAGNOSIS — B9620 Unspecified Escherichia coli [E. coli] as the cause of diseases classified elsewhere: Secondary | ICD-10-CM | POA: Diagnosis not present

## 2022-04-03 DIAGNOSIS — E8809 Other disorders of plasma-protein metabolism, not elsewhere classified: Secondary | ICD-10-CM | POA: Diagnosis present

## 2022-04-03 DIAGNOSIS — Z5331 Laparoscopic surgical procedure converted to open procedure: Secondary | ICD-10-CM

## 2022-04-03 DIAGNOSIS — Z20822 Contact with and (suspected) exposure to covid-19: Secondary | ICD-10-CM | POA: Diagnosis present

## 2022-04-03 DIAGNOSIS — I248 Other forms of acute ischemic heart disease: Secondary | ICD-10-CM | POA: Diagnosis present

## 2022-04-03 DIAGNOSIS — C7800 Secondary malignant neoplasm of unspecified lung: Secondary | ICD-10-CM | POA: Diagnosis present

## 2022-04-03 DIAGNOSIS — R64 Cachexia: Secondary | ICD-10-CM | POA: Diagnosis present

## 2022-04-03 DIAGNOSIS — Z808 Family history of malignant neoplasm of other organs or systems: Secondary | ICD-10-CM

## 2022-04-03 DIAGNOSIS — R159 Full incontinence of feces: Secondary | ICD-10-CM | POA: Diagnosis present

## 2022-04-03 DIAGNOSIS — Z6823 Body mass index (BMI) 23.0-23.9, adult: Secondary | ICD-10-CM

## 2022-04-03 DIAGNOSIS — K7460 Unspecified cirrhosis of liver: Secondary | ICD-10-CM | POA: Diagnosis present

## 2022-04-03 DIAGNOSIS — C787 Secondary malignant neoplasm of liver and intrahepatic bile duct: Secondary | ICD-10-CM | POA: Diagnosis present

## 2022-04-03 DIAGNOSIS — K567 Ileus, unspecified: Secondary | ICD-10-CM | POA: Diagnosis present

## 2022-04-03 DIAGNOSIS — G9340 Encephalopathy, unspecified: Secondary | ICD-10-CM | POA: Diagnosis not present

## 2022-04-03 DIAGNOSIS — D630 Anemia in neoplastic disease: Secondary | ICD-10-CM | POA: Diagnosis not present

## 2022-04-03 DIAGNOSIS — K56600 Partial intestinal obstruction, unspecified as to cause: Secondary | ICD-10-CM

## 2022-04-03 DIAGNOSIS — N131 Hydronephrosis with ureteral stricture, not elsewhere classified: Secondary | ICD-10-CM | POA: Diagnosis present

## 2022-04-03 DIAGNOSIS — D684 Acquired coagulation factor deficiency: Secondary | ICD-10-CM | POA: Diagnosis present

## 2022-04-03 DIAGNOSIS — C799 Secondary malignant neoplasm of unspecified site: Secondary | ICD-10-CM | POA: Diagnosis not present

## 2022-04-03 DIAGNOSIS — B9689 Other specified bacterial agents as the cause of diseases classified elsewhere: Secondary | ICD-10-CM | POA: Diagnosis not present

## 2022-04-03 DIAGNOSIS — K921 Melena: Secondary | ICD-10-CM | POA: Diagnosis not present

## 2022-04-03 DIAGNOSIS — R531 Weakness: Principal | ICD-10-CM

## 2022-04-03 DIAGNOSIS — B1920 Unspecified viral hepatitis C without hepatic coma: Secondary | ICD-10-CM | POA: Diagnosis present

## 2022-04-03 DIAGNOSIS — B961 Klebsiella pneumoniae [K. pneumoniae] as the cause of diseases classified elsewhere: Secondary | ICD-10-CM | POA: Diagnosis present

## 2022-04-03 DIAGNOSIS — Z7189 Other specified counseling: Secondary | ICD-10-CM | POA: Diagnosis not present

## 2022-04-03 DIAGNOSIS — Z8042 Family history of malignant neoplasm of prostate: Secondary | ICD-10-CM

## 2022-04-03 DIAGNOSIS — R6521 Severe sepsis with septic shock: Secondary | ICD-10-CM | POA: Diagnosis present

## 2022-04-03 DIAGNOSIS — E43 Unspecified severe protein-calorie malnutrition: Secondary | ICD-10-CM | POA: Diagnosis present

## 2022-04-03 DIAGNOSIS — Z781 Physical restraint status: Secondary | ICD-10-CM

## 2022-04-03 DIAGNOSIS — C20 Malignant neoplasm of rectum: Secondary | ICD-10-CM | POA: Diagnosis not present

## 2022-04-03 DIAGNOSIS — B182 Chronic viral hepatitis C: Secondary | ICD-10-CM | POA: Diagnosis not present

## 2022-04-03 LAB — COMPREHENSIVE METABOLIC PANEL
ALT: 18 U/L (ref 0–44)
AST: 33 U/L (ref 15–41)
Albumin: 2.6 g/dL — ABNORMAL LOW (ref 3.5–5.0)
Alkaline Phosphatase: 83 U/L (ref 38–126)
Anion gap: 8 (ref 5–15)
BUN: 38 mg/dL — ABNORMAL HIGH (ref 6–20)
CO2: 24 mmol/L (ref 22–32)
Calcium: 8.3 mg/dL — ABNORMAL LOW (ref 8.9–10.3)
Chloride: 105 mmol/L (ref 98–111)
Creatinine, Ser: 0.72 mg/dL (ref 0.44–1.00)
GFR, Estimated: >60 mL/min (ref >60)
Glucose, Bld: 128 mg/dL — ABNORMAL HIGH (ref 70–99)
Potassium: 3.5 mmol/L (ref 3.5–5.1)
Sodium: 137 mmol/L (ref 135–145)
Total Bilirubin: 2.7 mg/dL — ABNORMAL HIGH (ref 0.3–1.2)
Total Protein: 5.6 g/dL — ABNORMAL LOW (ref 6.5–8.1)

## 2022-04-03 LAB — C DIFFICILE QUICK SCREEN W PCR REFLEX
C Diff antigen: NEGATIVE
C Diff interpretation: NOT DETECTED
C Diff toxin: NEGATIVE

## 2022-04-03 LAB — POC OCCULT BLOOD, ED: Fecal Occult Bld: POSITIVE — AB

## 2022-04-03 LAB — CBC WITH DIFFERENTIAL/PLATELET
Abs Immature Granulocytes: 0.11 10*3/uL — ABNORMAL HIGH (ref 0.00–0.07)
Basophils Absolute: 0 10*3/uL (ref 0.0–0.1)
Basophils Relative: 0 %
Eosinophils Absolute: 0 10*3/uL (ref 0.0–0.5)
Eosinophils Relative: 0 %
HCT: 33.8 % — ABNORMAL LOW (ref 36.0–46.0)
Hemoglobin: 11.5 g/dL — ABNORMAL LOW (ref 12.0–15.0)
Immature Granulocytes: 1 %
Lymphocytes Relative: 5 %
Lymphs Abs: 0.7 10*3/uL (ref 0.7–4.0)
MCH: 33.7 pg (ref 26.0–34.0)
MCHC: 34 g/dL (ref 30.0–36.0)
MCV: 99.1 fL (ref 80.0–100.0)
Monocytes Absolute: 1 10*3/uL (ref 0.1–1.0)
Monocytes Relative: 7 %
Neutro Abs: 11.5 10*3/uL — ABNORMAL HIGH (ref 1.7–7.7)
Neutrophils Relative %: 87 %
Platelets: 72 10*3/uL — ABNORMAL LOW (ref 150–400)
RBC: 3.41 MIL/uL — ABNORMAL LOW (ref 3.87–5.11)
RDW: 23.2 % — ABNORMAL HIGH (ref 11.5–15.5)
WBC: 13.3 10*3/uL — ABNORMAL HIGH (ref 4.0–10.5)
nRBC: 0 % (ref 0.0–0.2)

## 2022-04-03 LAB — URINALYSIS, ROUTINE W REFLEX MICROSCOPIC
Bilirubin Urine: NEGATIVE
Glucose, UA: NEGATIVE mg/dL
Ketones, ur: NEGATIVE mg/dL
Nitrite: NEGATIVE
Protein, ur: NEGATIVE mg/dL
Specific Gravity, Urine: 1.025 (ref 1.005–1.030)
pH: 6 (ref 5.0–8.0)

## 2022-04-03 LAB — PROTIME-INR
INR: 3 — ABNORMAL HIGH (ref 0.8–1.2)
Prothrombin Time: 31.1 seconds — ABNORMAL HIGH (ref 11.4–15.2)

## 2022-04-03 LAB — RESP PANEL BY RT-PCR (FLU A&B, COVID) ARPGX2
Influenza A by PCR: NEGATIVE
Influenza B by PCR: NEGATIVE
SARS Coronavirus 2 by RT PCR: NEGATIVE

## 2022-04-03 LAB — TYPE AND SCREEN
ABO/RH(D): O NEG
Antibody Screen: NEGATIVE

## 2022-04-03 LAB — MRSA NEXT GEN BY PCR, NASAL: MRSA by PCR Next Gen: NOT DETECTED

## 2022-04-03 LAB — AMMONIA: Ammonia: 34 umol/L (ref 9–35)

## 2022-04-03 LAB — LACTIC ACID, PLASMA
Lactic Acid, Venous: 5.6 mmol/L (ref 0.5–1.9)
Lactic Acid, Venous: 4.6 mmol/L (ref 0.5–1.9)
Lactic Acid, Venous: 3.2 mmol/L (ref 0.5–1.9)

## 2022-04-03 LAB — LIPASE, BLOOD: Lipase: 44 U/L (ref 11–51)

## 2022-04-03 LAB — TROPONIN I (HIGH SENSITIVITY)
Troponin I (High Sensitivity): 108 ng/L (ref <18)
Troponin I (High Sensitivity): 125 ng/L (ref <18)
Troponin I (High Sensitivity): 164 ng/L (ref <18)

## 2022-04-03 MED ORDER — LACTATED RINGERS IV BOLUS
500.0000 mL | Freq: Once | INTRAVENOUS | Status: AC
  Administered 2022-04-03: 500 mL via INTRAVENOUS

## 2022-04-03 MED ORDER — SODIUM CHLORIDE 0.9 % IV BOLUS
1000.0000 mL | Freq: Once | INTRAVENOUS | Status: AC
Start: 2022-04-03 — End: 2022-04-03
  Administered 2022-04-03: 1000 mL via INTRAVENOUS

## 2022-04-03 MED ORDER — SODIUM CHLORIDE 0.9 % IV SOLN
2.0000 g | Freq: Three times a day (TID) | INTRAVENOUS | Status: DC
  Administered 2022-04-03 – 2022-04-04 (×2): 2 g via INTRAVENOUS
  Filled 2022-04-03 (×2): qty 12.5

## 2022-04-03 MED ORDER — POTASSIUM CHLORIDE CRYS ER 20 MEQ PO TBCR: 20.0000 meq | EXTENDED_RELEASE_TABLET | Freq: Every day | ORAL | Status: DC

## 2022-04-03 MED ORDER — IOHEXOL 300 MG/ML  SOLN
100.0000 mL | Freq: Once | INTRAMUSCULAR | Status: AC | PRN
  Administered 2022-04-03: 100 mL via INTRAVENOUS

## 2022-04-03 MED ORDER — ACETAMINOPHEN 650 MG RE SUPP: 650.0000 mg | Freq: Four times a day (QID) | RECTAL | Status: DC | PRN

## 2022-04-03 MED ORDER — VANCOMYCIN HCL IN DEXTROSE 1-5 GM/200ML-% IV SOLN
1000.0000 mg | Freq: Once | INTRAVENOUS | Status: AC
  Administered 2022-04-03: 1000 mg via INTRAVENOUS
  Filled 2022-04-03: qty 200

## 2022-04-03 MED ORDER — LACTATED RINGERS IV SOLN: INTRAVENOUS | Status: DC

## 2022-04-03 MED ORDER — PANTOPRAZOLE 80MG IVPB - SIMPLE MED
80.0000 mg | Freq: Once | INTRAVENOUS | Status: AC
  Administered 2022-04-03: 80 mg via INTRAVENOUS
  Filled 2022-04-03: qty 80

## 2022-04-03 MED ORDER — ACETAMINOPHEN 325 MG PO TABS
650.0000 mg | ORAL_TABLET | Freq: Four times a day (QID) | ORAL | Status: DC | PRN
  Administered 2022-04-08 – 2022-04-14 (×6): 650 mg via ORAL
  Filled 2022-04-03 (×7): qty 2

## 2022-04-03 MED ORDER — CHLORHEXIDINE GLUCONATE CLOTH 2 % EX PADS
6.0000 | MEDICATED_PAD | Freq: Every day | CUTANEOUS | Status: DC
  Administered 2022-04-03 – 2022-04-07 (×4): 6 via TOPICAL

## 2022-04-03 MED ORDER — APIXABAN 5 MG PO TABS
5.0000 mg | ORAL_TABLET | Freq: Two times a day (BID) | ORAL | Status: DC
  Administered 2022-04-03: 5 mg via ORAL
  Filled 2022-04-03: qty 1

## 2022-04-03 MED ORDER — ORAL CARE MOUTH RINSE
15.0000 mL | OROMUCOSAL | Status: DC
  Administered 2022-04-03 – 2022-04-06 (×8): 15 mL via OROMUCOSAL

## 2022-04-03 MED ORDER — OXYCODONE HCL 5 MG PO TABS
5.0000 mg | ORAL_TABLET | ORAL | Status: DC | PRN
  Administered 2022-04-07 – 2022-04-10 (×7): 5 mg via ORAL
  Filled 2022-04-03 (×7): qty 1

## 2022-04-03 MED ORDER — SODIUM CHLORIDE 0.9 % IV SOLN
2.0000 g | Freq: Once | INTRAVENOUS | Status: AC
  Administered 2022-04-03: 2 g via INTRAVENOUS
  Filled 2022-04-03: qty 12.5

## 2022-04-03 MED ORDER — SODIUM CHLORIDE (PF) 0.9 % IJ SOLN
INTRAMUSCULAR | Status: AC
  Filled 2022-04-03: qty 50

## 2022-04-03 MED ORDER — ORAL CARE MOUTH RINSE: 15.0000 mL | OROMUCOSAL | Status: DC | PRN

## 2022-04-03 MED ORDER — METRONIDAZOLE 500 MG/100ML IV SOLN
500.0000 mg | Freq: Two times a day (BID) | INTRAVENOUS | Status: DC
  Administered 2022-04-03: 500 mg via INTRAVENOUS
  Filled 2022-04-03: qty 100

## 2022-04-03 MED ORDER — PROCHLORPERAZINE EDISYLATE 10 MG/2ML IJ SOLN
10.0000 mg | Freq: Four times a day (QID) | INTRAMUSCULAR | Status: DC | PRN
Start: 2022-04-03 — End: 2022-04-25

## 2022-04-03 NOTE — Telephone Encounter (Signed)
Pt's spouse called to let Dr. Burr Medico know that he was taking the pt to the ED d/t pt might have another UTI.  Pt's spouse said the pt was confused, lethargic, and has an elevated HR 138.  Pt's spouse just wanted to make Dr. Burr Medico aware and requesting if Dr. Burr Medico could call him.

## 2022-04-03 NOTE — ED Provider Notes (Addendum)
[facility] DEPT Provider Note   CSN: [id number] Arrival date & time: 04/03/22  1018     History  Chief Complaint  Patient presents with   Weakness    Angela Mcpherson is a 61 y.o. female.  61 year old female with prior medical history as detailed below presents for evaluation.  Patient reports increased weakness over the last 2 days.  Patient apparently complained to EMS of shortness of breath.  Patient was noted to be tachycardic and hypotensive with EMS.  EMS reports initial heart rate in the 140s.  Blood pressure is apparently 80 systolic.  EMS reports confusion on initial evaluation.  Patient was given 2 L of normal saline during transport by EMS.  Patient complains of significant diarrhea.  She denies vomiting.  She complains of diffuse abdominal pain.  Patient is alert and oriented.  She appears to be improved after IV hydration initiated by EMS.  The history is provided by the patient and medical records.       Home Medications Prior to Admission medications   Medication Sig Start Date End Date Taking? Authorizing Provider  acetaminophen (TYLENOL) 325 MG tablet Take 650 mg by mouth every 6 (six) hours as needed for moderate pain.    [provider]  capecitabine (XELODA) 500 MG tablet TAKE 2 TABLETS EVERY 12 HOURS  FOR 14 DAYS ON THEN 7 DAYS OFF, TAKE WITH MEALS 03/24/22   Truitt Merle, MD  cholecalciferol (VITAMIN D3) 25 MCG (1000 UNIT) tablet Take 1,000 Units by mouth daily.    [provider]  diphenoxylate-atropine (LOMOTIL) 2.5-0.025 MG tablet Take 2 tablets by mouth 4 (four) times daily as needed for diarrhea or loose stools. 01/06/22   Truitt Merle, MD  ELIQUIS 5 MG TABS tablet TAKE 1 TABLET BY MOUTH TWICE A DAY 03/24/22   Truitt Merle, MD  KLOR-CON M20 20 MEQ tablet TAKE 1 TABLET BY MOUTH TWICE A DAY 12/13/21   Truitt Merle, MD  loperamide (IMODIUM) 2 MG capsule Take 1 capsule (2 mg total) by mouth as needed for diarrhea or loose  stools. 05/27/21   Nicole Kindred A, DO  ondansetron (ZOFRAN) 8 MG tablet Take 1 tablet (8 mg total) by mouth 2 (two) times daily as needed for refractory nausea / vomiting. Start on day 3 after chemotherapy. 03/06/21   Truitt Merle, MD  VALERIAN ROOT PO Take 1 tablet by mouth daily.    [provider]      Allergies    Codeine, Epinephrine (anaphylaxis), Orange fruit [citrus], Streptogramins, and Tomato    Review of Systems   Review of Systems  All other systems reviewed and are negative.   Physical Exam Updated Vital Signs BP 113/78   Pulse (!) 160   Temp 97.7 F (36.5 C) (Oral)   Resp (!) 26   SpO2 100%  Physical Exam Vitals and nursing note reviewed.  Constitutional:      General: She is not in acute distress.    Appearance: Normal appearance. She is well-developed.  HENT:     Head: Normocephalic and atraumatic.  Eyes:     Conjunctiva/sclera: Conjunctivae normal.     Pupils: Pupils are equal, round, and reactive to light.  Cardiovascular:     Rate and Rhythm: Normal rate and regular rhythm.     Heart sounds: Normal heart sounds.  Pulmonary:     Effort: Pulmonary effort is normal. No respiratory distress.     Breath sounds: Normal breath sounds.  Abdominal:  General: There is no distension.     Palpations: Abdomen is soft.     Tenderness: There is no abdominal tenderness.  Genitourinary:    Comments: Incontinent of stool. Musculoskeletal:        General: No deformity. Normal range of motion.     Cervical back: Normal range of motion and neck supple.  Skin:    General: Skin is warm and dry.  Neurological:     General: No focal deficit present.     Mental Status: She is alert and oriented to person, place, and time.     ED Results / Procedures / Treatments   Labs (all labs ordered are listed, but only abnormal results are displayed) Labs Reviewed  CBC WITH DIFFERENTIAL/PLATELET - Abnormal; Notable for the following components:      Result Value    WBC 13.3 (*)    RBC 3.41 (*)    Hemoglobin 11.5 (*)    HCT 33.8 (*)    RDW 23.2 (*)    Platelets 72 (*)    Neutro Abs 11.5 (*)    Abs Immature Granulocytes 0.11 (*)    All other components within normal limits  PROTIME-INR - Abnormal; Notable for the following components:   Prothrombin Time 31.1 (*)    INR 3.0 (*)    All other components within normal limits  COMPREHENSIVE METABOLIC PANEL - Abnormal; Notable for the following components:   Glucose, Bld 128 (*)    BUN 38 (*)    Calcium 8.3 (*)    Total Protein 5.6 (*)    Albumin 2.6 (*)    Total Bilirubin 2.7 (*)    All other components within normal limits  POC OCCULT BLOOD, ED - Abnormal; Notable for the following components:   Fecal Occult Bld POSITIVE (*)    All other components within normal limits  TROPONIN I (HIGH SENSITIVITY) - Abnormal; Notable for the following components:   Troponin I (High Sensitivity) 108 (*)    All other components within normal limits  RESP PANEL BY RT-PCR (FLU A&B, COVID) ARPGX2  CULTURE, BLOOD (ROUTINE X 2)  CULTURE, BLOOD (ROUTINE X 2)  LIPASE, BLOOD  LACTIC ACID, PLASMA  URINALYSIS, ROUTINE W REFLEX MICROSCOPIC  LACTIC ACID, PLASMA  AMMONIA  TYPE AND SCREEN  TROPONIN I (HIGH SENSITIVITY)    EKG EKG Interpretation  Date/Time:  Thursday April 03 2022 10:30:11 EDT Ventricular Rate:  118 PR Interval:  134 QRS Duration: 102 QT Interval:  291 QTC Calculation: 408 R Axis:   27 Text Interpretation: Sinus tachycardia Probable left atrial enlargement RSR' in V1 or V2, right VCD or RVH Nonspecific T abnormalities, diffuse leads Confirmed by Dene Gentry [phone]) on 04/03/2022 10:54:21 AM  Radiology CT Angio Chest PE W and/or Wo Contrast  Result Date: 04/03/2022 CLINICAL DATA:  Hypoxia. EXAM: CT ANGIOGRAPHY CHEST WITH CONTRAST TECHNIQUE: Multidetector CT imaging of the chest was performed using the standard protocol during bolus administration of intravenous contrast. Multiplanar CT image  reconstructions and MIPs were obtained to evaluate the vascular anatomy. RADIATION DOSE REDUCTION: This exam was performed according to the departmental dose-optimization program which includes automated exposure control, adjustment of the mA and/or kV according to patient size and/or use of iterative reconstruction technique. CONTRAST:  162m OMNIPAQUE IOHEXOL 300 MG/ML  SOLN COMPARISON:  March 18, 2022. FINDINGS: Cardiovascular: Satisfactory opacification of the pulmonary arteries to the segmental level. No evidence of pulmonary embolism. Normal heart size. No pericardial effusion. Mediastinum/Nodes: No enlarged mediastinal, hilar, or axillary  lymph nodes. Thyroid gland, trachea, and esophagus demonstrate no significant findings. Lungs/Pleura: Lungs are clear. No pleural effusion or pneumothorax. Upper Abdomen: No acute abnormality. Musculoskeletal: No chest wall abnormality. No acute or significant osseous findings. Review of the MIP images confirms the above findings. IMPRESSION: No definite evidence of pulmonary embolus. No definite abnormality seen in the chest. Electronically Signed   By: Marijo Conception M.D.   On: 04/03/2022 14:41   CT ABDOMEN PELVIS W CONTRAST  Result Date: 04/03/2022 CLINICAL DATA:  Weakness, hypoxia, metastatic colon cancer * Tracking Code: BO * EXAM: CT ABDOMEN AND PELVIS WITH CONTRAST TECHNIQUE: Multidetector CT imaging of the abdomen and pelvis was performed using the standard protocol following bolus administration of intravenous contrast. RADIATION DOSE REDUCTION: This exam was performed according to the departmental dose-optimization program which includes automated exposure control, adjustment of the mA and/or kV according to patient size and/or use of iterative reconstruction technique. CONTRAST:  171m OMNIPAQUE IOHEXOL 300 MG/ML  SOLN COMPARISON:  CT chest abdomen pelvis, 03/18/2022 FINDINGS: Lower chest: No acute abnormality. Please see separately reported examination of  the chest. Hepatobiliary: No solid liver abnormality is seen. No gallstones, gallbladder wall thickening, or biliary dilatation. Pancreas: Unremarkable. No pancreatic ductal dilatation or surrounding inflammatory changes. Spleen: Splenomegaly, maximum coronal span 14.7 cm. Adrenals/Urinary Tract: Adrenal glands are unremarkable. Kidneys are normal, without renal calculi, solid lesion, or hydronephrosis. Mildly distended urinary bladder. Stomach/Bowel: Stomach is within normal limits. Appendix appears normal. New, diffuse, long segment wall thickening of the colon, involving the cecum through the splenic flexure (series 7, image 29, 17, series 4, image 55). Unchanged circumferential mass of the rectosigmoid junction (series 4, image 73). Vascular/Lymphatic: Aortic atherosclerosis. Unchanged left mesorectal or pelvic sidewall lymph node measuring 3.6 x 2.3 cm (series 4, image 79). Unchanged prominent subcentimeter retroperitoneal lymph nodes (series 4, image 46). Reproductive: No mass or other significant abnormality. Other: No abdominal wall hernia or abnormality. No ascites. Musculoskeletal: No acute or significant osseous findings. IMPRESSION: 1. New, diffuse, long segment wall thickening of the colon, involving the cecum through the splenic flexure. Findings are consistent with nonspecific infectious, inflammatory, or ischemic colitis. 2. Unchanged circumferential mass of the rectosigmoid junction. Unchanged enlarged left mesorectal or pelvic sidewall lymph node. 3. Distended urinary bladder.  Correlate for urinary retention. 4. Splenomegaly. Aortic Atherosclerosis (ICD10-I70.0). Electronically Signed   By: ADelanna AhmadiM.D.   On: 04/03/2022 14:40   DG Chest Port 1 View  Result Date: 04/03/2022 CLINICAL DATA:  Weakness EXAM: PORTABLE CHEST 1 VIEW COMPARISON:  11/18/2021 FINDINGS: The heart size and mediastinal contours are within normal limits. Both lungs are clear. The visualized skeletal structures are  unremarkable. IMPRESSION: No acute abnormality lungs in AP portable projection. Electronically Signed   By: ADelanna AhmadiM.D.   On: 04/03/2022 10:50    Procedures Procedures    Medications Ordered in ED Medications  sodium chloride (PF) 0.9 % injection (  Not Given 04/03/22 1440)  metroNIDAZOLE (FLAGYL) IVPB 500 mg (has no administration in time range)  sodium chloride 0.9 % bolus 1,000 mL (1,000 mLs Intravenous New Bag/Given 04/03/22 1116)  ceFEPIme (MAXIPIME) 2 g in sodium chloride 0.9 % 100 mL IVPB (0 g Intravenous Stopped 04/03/22 1429)  vancomycin (VANCOCIN) IVPB 1000 mg/200 mL premix (0 mg Intravenous Stopped 04/03/22 1241)  pantoprazole (PROTONIX) 80 mg /NS 100 mL IVPB (0 mg Intravenous Stopped 04/03/22 1429)  iohexol (OMNIPAQUE) 300 MG/ML solution 100 mL (100 mLs Intravenous Contrast Given 04/03/22 1418)  ED Course/ Medical Decision Making/ A&P                           Medical Decision Making Amount and/or Complexity of Data Reviewed Labs: ordered. Radiology: ordered.  Risk Prescription drug management. Decision regarding hospitalization.    Medical Screen Complete  This patient presented to the ED with complaint of weakness, hypotension, diarrhea.  This complaint involves an extensive number of treatment options. The initial differential diagnosis includes, but is not limited to, sepsis, dehydration, metabolic abnormality, etc.  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  Reports significant diarrhea.  Patient's hemodynamics improved with IV fluids.  Broad-spectrum antibiotics initiated on arrival out of concern for possible sepsis.  Patient will require admission for further work-up and treatment.  CT imaging is suggestive  of possible colitis.  No evidence of PE found on CT of chest.  Additional history obtained:  Additional history obtained from EMS External records from outside sources obtained and reviewed including prior ED visits and prior Inpatient records.    Lab Tests:  I ordered and personally interpreted labs.  The pertinent results include: CBC, CMP, lipase, lactic acid, INR, occult blood test, COVID, flu   Imaging Studies ordered:  I ordered imaging studies including chest x-ray, CT chest abdomen pelvis I independently visualized and interpreted obtained imaging which showed likely colitis I agree with the radiologist interpretation.   Cardiac Monitoring:  The patient was maintained on a cardiac monitor.  I personally viewed and interpreted the cardiac monitor which showed an underlying rhythm of: Sinus tach   Medicines ordered:  I ordered medication including spectrum antibiotics and IV fluids for concern for infection Reevaluation of the patient after these medicines showed that the patient: improved   Problem List / ED Course:  Hypertension, diarrhea, tachycardia, colitis   Reevaluation:  After the interventions noted above, I reevaluated the patient and found that they have: improved   Disposition:  After consideration of the diagnostic results and the patients response to treatment, I feel that the patent would benefit from admission.    CRITICAL CARE Performed by: Valarie Merino   Total critical care time: 30 minutes  Critical care time was exclusive of separately billable procedures and treating other patients.  Critical care was necessary to treat or prevent imminent or life-threatening deterioration.  Critical care was time spent personally by me on the following activities: development of treatment plan with patient and/or surrogate as well as nursing, discussions with consultants, evaluation of patient's response to treatment, examination of patient,  obtaining history from patient or surrogate, ordering and performing treatments and interventions, ordering and review of laboratory studies, ordering and review of radiographic studies, pulse oximetry and re-evaluation of patient's condition.       Final Clinical Impression(s) / ED Diagnoses Final diagnoses:  Weakness  Hypotension, unspecified hypotension type  Diarrhea, unspecified type  Colitis    Rx / DC Orders ED Discharge Orders     None         Valarie Merino, MD 04/03/22 1455    Valarie Merino, MD 04/03/22 [phone]

## 2022-04-03 NOTE — ED Triage Notes (Signed)
Patient here from home reporting weakness x2 days with hypoxia today. AAO to self. Cancer pt.

## 2022-04-03 NOTE — H&P (Signed)
History and Physical    Patient: Angela Mcpherson ZTI:[id number] DOB: 26-Apr-1961 DOA: 04/03/2022 DOS: the patient was seen and examined on 04/03/2022 PCP: Default, Provider, MD  Patient coming from: Home  Chief Complaint: abdominal pain, confusion  HPI: Angela Mcpherson is a 61 y.o. female with medical history significant of aortic mural thrombus, rectal cancer. Presenting with abdominal pain and confusion. History is from her husband. He reports that she had chemotherapy 2 days ago. Since coming home, she's become more lethargic and has had poor appetites. She has complained of diffuse abdominal pain. He has noticed that she's been incontinent of bowel. She has become increasingly confused. This morning when her symptoms did not improve, he called for EMS to bring her to the ED for evaluation.   Review of Systems: unable to review all systems due to the inability of the patient to answer questions. Past Medical History:  Diagnosis Date   Colorectal cancer ([place])    Family history of brain cancer    Family history of prostate cancer    Family history of stomach cancer    Heart murmur    dx at 26   MVP is stable   Hemorrhoids    Past Surgical History:  Procedure Laterality Date   NO PAST SURGERIES     Social History:  reports that she has been smoking cigarettes. She has a 26.00 pack-year smoking history. She has never used smokeless tobacco. She reports that she does not currently use alcohol. She reports that she does not use drugs.  Allergies  Allergen Reactions   Codeine     unknown   Epinephrine (Anaphylaxis)     unknown   Orange Fruit [Citrus] Other (See Comments)    Blisters on face.   Streptogramins     unknown   Tomato Other (See Comments)    Blisters on face    Family History  Problem Relation Age of Onset   Prostate cancer Father    Brain cancer Maternal Aunt    Prostate cancer Paternal Uncle    Stomach cancer Maternal Grandfather 65   Colon cancer Neg Hx    Colon  polyps Neg Hx    Esophageal cancer Neg Hx    Rectal cancer Neg Hx     Prior to Admission medications   Medication Sig Start Date End Date Taking? Authorizing Provider  acetaminophen (TYLENOL) 325 MG tablet Take 650 mg by mouth every 6 (six) hours as needed for moderate pain.   Yes [provider]  capecitabine (XELODA) 500 MG tablet TAKE 2 TABLETS EVERY 12 HOURS  FOR 14 DAYS ON THEN 7 DAYS OFF, TAKE WITH MEALS Patient taking differently: Take 1,000 mg/m2 by mouth 2 (two) times daily after a meal. TAKE 2 TABLETS EVERY 12 HOURS  FOR 14 DAYS ON THEN 7 DAYS OFF, TAKE WITH MEALS 03/24/22  Yes Truitt Merle, MD  cholecalciferol (VITAMIN D3) 25 MCG (1000 UNIT) tablet Take 1,000 Units by mouth daily.   Yes [provider]  ELIQUIS 5 MG TABS tablet TAKE 1 TABLET BY MOUTH TWICE A DAY Patient taking differently: Take 5 mg by mouth 2 (two) times daily. 03/24/22  Yes Truitt Merle, MD  KLOR-CON M20 20 MEQ tablet TAKE 1 TABLET BY MOUTH TWICE A DAY Patient taking differently: Take 20 mEq by mouth daily. 12/13/21  Yes Truitt Merle, MD  loperamide (IMODIUM) 2 MG capsule Take 1 capsule (2 mg total) by mouth as needed for diarrhea or loose stools. 05/27/21  Yes Arbutus Ped,  Kelly A, DO  ondansetron (ZOFRAN) 8 MG tablet Take 1 tablet (8 mg total) by mouth 2 (two) times daily as needed for refractory nausea / vomiting. Start on day 3 after chemotherapy. Patient taking differently: Take 8 mg by mouth 2 (two) times daily as needed for refractory nausea / vomiting. 03/06/21  Yes Truitt Merle, MD  diphenoxylate-atropine (LOMOTIL) 2.5-0.025 MG tablet Take 2 tablets by mouth 4 (four) times daily as needed for diarrhea or loose stools. 01/06/22   Truitt Merle, MD    Physical Exam: Vitals:   04/03/22 1330 04/03/22 1441 04/03/22 1455 04/03/22 1527  BP: 113/78  112/64 110/61  Pulse: (!) 160   (!) 131  Resp: (!) 26  (!) 30 (!) 29  Temp:  97.7 F (36.5 C)    TempSrc:  Oral    SpO2: 100%   97%   General: 61 y.o. ill appearing  female resting in bed in NAD Eyes: PERRL, normal sclera ENMT: Nares patent w/o discharge, orophaynx clear, dentition poor, ears w/o discharge/lesions/ulcers Neck: Supple, trachea midline Cardiovascular: tachy, +S1, S2, no m/g/r, equal pulses throughout Respiratory: CTABL, no w/r/r, normal WOB GI: BS+, ND, TTP LLQ/RLQ, no masses noted, no organomegaly noted MSK: No e/c/c, cachetic Neuro: Alert and tracking, no focal deficits Psyc: confused, intermittently cooperative  Data Reviewed:  Lab Results  Component Value Date   NA 137 04/03/2022   K 3.5 04/03/2022   CO2 24 04/03/2022   GLUCOSE 128 (H) 04/03/2022   BUN 38 (H) 04/03/2022   CREATININE 0.72 04/03/2022   CALCIUM 8.3 (L) 04/03/2022   GFRNONAA >60 04/03/2022   Lab Results  Component Value Date   ALT 18 04/03/2022   AST 33 04/03/2022   ALKPHOS 83 04/03/2022   BILITOT 2.7 (H) 04/03/2022   Lactic acid: 5.6  Lab Results  Component Value Date   WBC 13.3 (H) 04/03/2022   HGB 11.5 (L) 04/03/2022   HCT 33.8 (L) 04/03/2022   MCV 99.1 04/03/2022   PLT 72 (L) 04/03/2022   CT ab/pelvis 1. New, diffuse, long segment wall thickening of the colon, involving the cecum through the splenic flexure. Findings are consistent with nonspecific infectious, inflammatory, or ischemic colitis. 2. Unchanged circumferential mass of the rectosigmoid junction. Unchanged enlarged left mesorectal or pelvic sidewall lymph node. 3. Distended urinary bladder.  Correlate for urinary retention. 4. Splenomegaly.  CTA chest PE No definite evidence of pulmonary embolus. No definite abnormality seen in the chest.  EKG: sinus tach, no st elevations  Assessment and Plan: Sepsis secondary to colitis     - admit to inpt, SDU     - continue cefepime/flagyl     - C diff, GI PCR pending; if negative, can add anti-diarrheals     - continue fluids     - follow Bld Cx, Ucx     - trend lactic acid  Urinary retention     - bladder scan shows 400cc+  urine; place foley  Acute metabolic encephalopathy     - secondary to above; treat the above  History of rectosigmoid cancer     - on chemo with Dr. Burr Medico; I have notified her that the patient is being admitted for sepsis; she will follow while inpt  Severe protein-calorie malnutrition     - dietitian consult as sepsis improves and she starts tolerating PO  History of aortic mural thrombus     - continue home eliquis  Elevated trp     - EKG as above     -  likely demand ischemia from sepsis; trend for now  Normocytic anemia Thrombocytopenia Positive FOBT     - check iron studies     - she has a positive FOBT, but her stool was brown     - Hgb is at baseline     - given her aortic thrombus history; I think we continue eliquis for now, and monitor her H&H  Advance Care Planning:   Code Status: FULL, confirmed with husband  Consults: Oncology (Dr. Burr Medico)  Family Communication: w/ husband by phone  Severity of Illness: The appropriate patient status for this patient is INPATIENT. Inpatient status is judged to be reasonable and necessary in order to provide the required intensity of service to ensure the patient's safety. The patient's presenting symptoms, physical exam findings, and initial radiographic and laboratory data in the context of their chronic comorbidities is felt to place them at high risk for further clinical deterioration. Furthermore, it is not anticipated that the patient will be medically stable for discharge from the hospital within 2 midnights of admission.   * I certify that at the point of admission it is my clinical judgment that the patient will require inpatient hospital care spanning beyond 2 midnights from the point of admission due to high intensity of service, high risk for further deterioration and high frequency of surveillance required.*  Author: Jonnie Finner, DO 04/03/2022 3:37 PM  For on call review [email or web address].

## 2022-04-03 NOTE — Sepsis Progress Note (Signed)
Notified bedside nurse of need to draw repeat lactic acid prior to end of 6 hour bundle.

## 2022-04-03 NOTE — ED Notes (Signed)
Patient transported to CT 

## 2022-04-03 NOTE — Sepsis Progress Note (Addendum)
Secure chat with bedside RN to find out what happened to the lactic acid that had been drawn at 1052. Call to lab placed. Lab stated tube was hemolyzed and discarded. Bedside RN stated she would redraw once the patient returned from CT.

## 2022-04-03 NOTE — ED Notes (Signed)
Patient retains 424m of urine in the bladder.

## 2022-04-03 NOTE — Progress Notes (Signed)
Pharmacy Note   A consult was received from an ED physician for vancomycin and cefepime per pharmacy dosing.    The patient's profile has been reviewed for ht/wt/allergies/indication/available labs.    A one time order has been placed for vancomycin 1000 mg IV x 1 and cefepime 2 gr IV x1 .    Further antibiotics/pharmacy consults should be ordered by admitting physician if indicated.                       Thank you,  Royetta Asal, PharmD, BCPS 04/03/2022 10:30 AM

## 2022-04-03 NOTE — Sepsis Progress Note (Signed)
eLink is following this Code Sepsis.   Notified provider of need to order blood cultures.

## 2022-04-04 ENCOUNTER — Other Ambulatory Visit: Payer: Self-pay

## 2022-04-04 DIAGNOSIS — I959 Hypotension, unspecified: Secondary | ICD-10-CM

## 2022-04-04 DIAGNOSIS — G9341 Metabolic encephalopathy: Secondary | ICD-10-CM | POA: Diagnosis not present

## 2022-04-04 DIAGNOSIS — R6520 Severe sepsis without septic shock: Secondary | ICD-10-CM

## 2022-04-04 DIAGNOSIS — G9340 Encephalopathy, unspecified: Secondary | ICD-10-CM | POA: Diagnosis not present

## 2022-04-04 DIAGNOSIS — K529 Noninfective gastroenteritis and colitis, unspecified: Secondary | ICD-10-CM | POA: Diagnosis not present

## 2022-04-04 DIAGNOSIS — R338 Other retention of urine: Secondary | ICD-10-CM | POA: Diagnosis not present

## 2022-04-04 DIAGNOSIS — A4151 Sepsis due to Escherichia coli [E. coli]: Secondary | ICD-10-CM

## 2022-04-04 DIAGNOSIS — E440 Moderate protein-calorie malnutrition: Secondary | ICD-10-CM | POA: Insufficient documentation

## 2022-04-04 LAB — BLOOD CULTURE ID PANEL (REFLEXED) - BCID2

## 2022-04-04 LAB — CBC
HCT: 28.2 % — ABNORMAL LOW (ref 36.0–46.0)
Hemoglobin: 9.5 g/dL — ABNORMAL LOW (ref 12.0–15.0)
MCH: 33.5 pg (ref 26.0–34.0)
MCHC: 33.7 g/dL (ref 30.0–36.0)
MCV: 99.3 fL (ref 80.0–100.0)
Platelets: 39 10*3/uL — ABNORMAL LOW (ref 150–400)
RBC: 2.84 MIL/uL — ABNORMAL LOW (ref 3.87–5.11)
RDW: 23.4 % — ABNORMAL HIGH (ref 11.5–15.5)
WBC: 6.9 10*3/uL (ref 4.0–10.5)
nRBC: 0 % (ref 0.0–0.2)

## 2022-04-04 LAB — GASTROINTESTINAL PANEL BY PCR, STOOL (REPLACES STOOL CULTURE)

## 2022-04-04 LAB — LACTIC ACID, PLASMA
Lactic Acid, Venous: 1.5 mmol/L (ref 0.5–1.9)
Lactic Acid, Venous: 2.2 mmol/L (ref 0.5–1.9)

## 2022-04-04 LAB — COMPREHENSIVE METABOLIC PANEL
ALT: 15 U/L (ref 0–44)
AST: 23 U/L (ref 15–41)
Albumin: 2.1 g/dL — ABNORMAL LOW (ref 3.5–5.0)
Alkaline Phosphatase: 67 U/L (ref 38–126)
Anion gap: 4 — ABNORMAL LOW (ref 5–15)
BUN: 32 mg/dL — ABNORMAL HIGH (ref 6–20)
CO2: 21 mmol/L — ABNORMAL LOW (ref 22–32)
Calcium: 7.6 mg/dL — ABNORMAL LOW (ref 8.9–10.3)
Chloride: 115 mmol/L — ABNORMAL HIGH (ref 98–111)
Creatinine, Ser: 0.67 mg/dL (ref 0.44–1.00)
GFR, Estimated: >60 mL/min (ref >60)
Glucose, Bld: 130 mg/dL — ABNORMAL HIGH (ref 70–99)
Potassium: 3.4 mmol/L — ABNORMAL LOW (ref 3.5–5.1)
Sodium: 140 mmol/L (ref 135–145)
Total Bilirubin: 2 mg/dL — ABNORMAL HIGH (ref 0.3–1.2)
Total Protein: 4.3 g/dL — ABNORMAL LOW (ref 6.5–8.1)

## 2022-04-04 LAB — MAGNESIUM: Magnesium: 2.1 mg/dL (ref 1.7–2.4)

## 2022-04-04 LAB — PROTIME-INR
INR: 4 — ABNORMAL HIGH (ref 0.8–1.2)
Prothrombin Time: 38.6 seconds — ABNORMAL HIGH (ref 11.4–15.2)

## 2022-04-04 LAB — CORTISOL-AM, BLOOD: Cortisol - AM: 24.3 ug/dL — ABNORMAL HIGH (ref 6.7–22.6)

## 2022-04-04 LAB — TROPONIN I (HIGH SENSITIVITY): Troponin I (High Sensitivity): 120 ng/L (ref <18)

## 2022-04-04 LAB — PROCALCITONIN: Procalcitonin: 52.97 ng/mL

## 2022-04-04 LAB — CORTISOL: Cortisol, Plasma: 18.2 ug/dL

## 2022-04-04 MED ORDER — SODIUM CHLORIDE 0.9 % IV SOLN
2.0000 g | INTRAVENOUS | Status: DC
  Administered 2022-04-04 – 2022-04-07 (×4): 2 g via INTRAVENOUS
  Filled 2022-04-04 (×4): qty 20

## 2022-04-04 MED ORDER — LACTATED RINGERS IV BOLUS
500.0000 mL | Freq: Once | INTRAVENOUS | Status: AC
Start: 2022-04-04 — End: 2022-04-04
  Administered 2022-04-04: 500 mL via INTRAVENOUS

## 2022-04-04 MED ORDER — ENSURE ENLIVE PO LIQD
237.0000 mL | Freq: Two times a day (BID) | ORAL | Status: DC
  Administered 2022-04-04 – 2022-04-10 (×8): 237 mL via ORAL

## 2022-04-04 MED ORDER — POTASSIUM CHLORIDE CRYS ER 20 MEQ PO TBCR
40.0000 meq | EXTENDED_RELEASE_TABLET | Freq: Once | ORAL | Status: AC
  Administered 2022-04-04: 40 meq via ORAL
  Filled 2022-04-04: qty 2

## 2022-04-04 MED ORDER — LACTATED RINGERS IV BOLUS
1000.0000 mL | Freq: Once | INTRAVENOUS | Status: AC
  Administered 2022-04-04: 1000 mL via INTRAVENOUS

## 2022-04-04 MED ORDER — ADULT MULTIVITAMIN W/MINERALS CH
1.0000 | ORAL_TABLET | Freq: Every day | ORAL | Status: DC
  Administered 2022-04-05 – 2022-04-11 (×5): 1 via ORAL
  Filled 2022-04-04 (×8): qty 1

## 2022-04-04 MED ORDER — BOOST / RESOURCE BREEZE PO LIQD CUSTOM
1.0000 | ORAL | Status: DC
  Administered 2022-04-04 – 2022-04-07 (×4): 1 via ORAL

## 2022-04-04 NOTE — Progress Notes (Signed)
Initial Nutrition Assessment  DOCUMENTATION CODES:   Non-severe (moderate) malnutrition in context of chronic illness  INTERVENTION:  - will order Boost Breeze once/day, each supplement provides 250 kcal and 9 grams of protein.  - will order Ensure Plus High Protein BID, each supplement provides 350 kcal and 20 grams of protein.  - will order 1 tablet multivitamin with minerals/day.  - diet advancement as medically feasible.  - will enter smart phrase in AVS for patient to consume 2 bottles of Ensure Plus, or similar, per day for at least 1 month after d/c.   NUTRITION DIAGNOSIS:   Moderate Malnutrition related to chronic illness, cancer and cancer related treatments as evidenced by moderate fat depletion, moderate muscle depletion.  GOAL:   Patient will meet greater than or equal to 90% of their needs  MONITOR:   PO intake, Supplement acceptance, Diet advancement, Labs, Weight trends  REASON FOR ASSESSMENT:   Malnutrition Screening Tool  ASSESSMENT:   61 y.o. female with medical history of for aortic mural thrombus and rectal cancer on oral chemo. She presented to the ED due to abdominal pain and confusion. Her last chemo was 2 days PTA. At home it was noted that she was lethargic and having loose stools. In the ED there was concern for colitis so she was admitted for further management. Patient admitted with dx of sepsis.  Patient resting in bed and her husband is at bedside. Patient shares that she had not eaten x4 days PTA.  Noted that patient is currently ordered a clear liquid diet. She shares that her husband (and he confirms) brought her a filet of fish and she ate most of it without issue. She did not finish it as it began to get cold. She denies abdominal pain, pressure, cramping, or nausea at the time of RD visit.  She has been trying to remain active by doing upper and lower body exercises in bed.   Patient shares that she is thankful to be on oral chemo so  that she does not have to go somewhere for treatment.   She shares that prior to the 4 days PTA her appetite and intakes were mainly good. She has Ensure or similar at home though has not drank it recently d/t eating well. She is very appreciative of RD offer to order during hospitalization and is agreeable to continuing at home.   Weight today is 109 lb and weight on 8/7 was 117 lb. This indicates 8 lb weight loss (6.8% body weight) in 11 days. Noted need for high rate IV fluids.   Labs reviewed; K: 3.4 mmol/l, BUN: 32 mg/dl, Cl: 115 mmol/l, Ca: 7.6 mg/dl.  Medications reviewed; 80 mg IV protonix x1 dose 8/17, 40 mEq Klor-Con x1 dose 8/18.  IVF; LR @ 100 ml/hr.    NUTRITION - FOCUSED PHYSICAL EXAM:  Flowsheet Row Most Recent Value  Orbital Region Mild depletion  Upper Arm Region Moderate depletion  Thoracic and Lumbar Region Unable to assess  Buccal Region Moderate depletion  Temple Region Mild depletion  Clavicle Bone Region Moderate depletion  Clavicle and Acromion Bone Region Moderate depletion  Scapular Bone Region Moderate depletion  Dorsal Hand Mild depletion  Patellar Region Moderate depletion  Anterior Thigh Region Moderate depletion  Posterior Calf Region Moderate depletion  Edema (RD Assessment) None  Hair Reviewed  Eyes Reviewed  Mouth Reviewed  Skin Reviewed  Nails Reviewed       Diet Order:   Diet Order  Diet clear liquid Room service appropriate? Yes; Fluid consistency: Thin  Diet effective now                   EDUCATION NEEDS:   Education needs have been addressed  Skin:  Skin Assessment: Reviewed RN Assessment  Last BM:  8/17 (type 6 x1, large amount)  Height:   Ht Readings from Last 1 Encounters:  04/04/22 '5\' 4"'$  (1.626 m)    Weight:   Wt Readings from Last 1 Encounters:  04/04/22 49.3 kg     BMI:  Body mass index is 18.66 kg/m.  Estimated Nutritional Needs:  Kcal:  1700-2000 kcal Protein:  85-95 grams Fluid:   >/= 2 L/day     Jarome Matin, MS, RD, LDN, CNSC Registered Dietitian II Inpatient Clinical Nutrition RD pager # and on-call/weekend pager # available in [facility]

## 2022-04-04 NOTE — Progress Notes (Signed)
[facility] PROGRESS NOTE   Angela Mcpherson KZS:[id number] DOB: December 03, 1960 DOA: 04/03/2022  PCP: Default, Provider, MD  Brief History/Interval Summary: 61 y.o. female with medical history significant for aortic mural thrombus, rectal cancer.  Presented with abdominal pain and confusion.  She had chemotherapy 2 days prior to admission.  Was noted to be lethargic and has been having loose stools at home.  Evaluation in the ED raised concern for colitis.  Patient was hospitalized for further management.    Consultants: Medical oncologist has been notified, Dr. Burr Medico  Procedures: None    Subjective/Interval History: Patient somewhat distracted but able to answer most of my questions appropriately.  Denies any dizziness or lightheadedness.  No chest pain.  Some abdominal discomfort.  Has been having loose stools.  No blood in the stool.  No nausea or vomiting.     Assessment/Plan:  Acute colitis with sepsis, present on admission/bacteremia Lactic acid level was noted to be significantly elevated.  Patient aggressively hydrated.  Blood pressure noted to be low overnight.  However she does not appear to be symptomatic.  Cortisol level normal.  Fluid bolus to be given.  Lactic acid levels have improved and noted to be normal today.  Significantly elevated procalcitonin level noted. C. difficile PCR is negative.  GI pathogen panel is pending. Blood culture positive for E. coli. Initially placed on cefepime and metronidazole.  Changed over to ceftriaxone.  History of rectal cancer Followed by medical oncology.  On chemotherapy.  Medical oncologist was notified of patient's admission.  Acute metabolic encephalopathy No focal neurological deficits on examination.  Mentation seems to be gradually improving.  Encephalopathy most likely due to sepsis.  Urinary retention Foley catheter had to be placed.  Continue to monitor.  Mildly elevated troponin levels Most likely demand ischemia.   Patient denies any chest pain.  No ischemic changes noted on EKG.  Proceed with echocardiogram.  History of aortic mural thrombus Eliquis being continued.  This was originally diagnosed in October 2022.  She was found to have noncalcified thrombus in the infrarenal abdominal aorta causing severe stenosis at that time.  Subsequent CT scan done and January 2023 showed significant interval decrease in the thrombus. Since there has been a decrease in thrombus size and no mention of the thrombus on subsequent imaging studies anticoagulation can be briefly held if needed.  Hypokalemia Replete.  Magnesium 2.1.  Normocytic anemia/thrombocytopenia Etiology unclear.  No overt blood loss noted though her stool for occult blood was positive.  She has had thrombocytopenia for several months now.  Platelet count noted to be 39,000 this morning.  Drop in platelet count could be due to sepsis.  Watch for signs of bleeding.  We will hold her Eliquis for now.  DVT Prophylaxis: On Eliquis will be held for now.  SCDs. Code Status: Full code Family Communication: Discussed with patient.  No family at bedside Disposition Plan: Hopefully return home when improved  Status is: Inpatient Remains inpatient appropriate because: Sepsis, bacteremia, colitis      Medications: Scheduled:  apixaban  5 mg Oral BID   Chlorhexidine Gluconate Cloth  6 each Topical Daily   mouth rinse  15 mL Mouth Rinse 4 times per day   Continuous:  cefTRIAXone (ROCEPHIN)  IV 2 g (04/04/22 0817)   lactated ringers 100 mL/hr at 04/04/22 0430   DDU:[id number] **OR** acetaminophen, mouth rinse, oxyCODONE, prochlorperazine  Antibiotics: Anti-infectives (From admission, onward)    Start     Dose/Rate Route Frequency Ordered  Stop   04/04/22 0900  cefTRIAXone (ROCEPHIN) 2 g in sodium chloride 0.9 % 100 mL IVPB        2 g 200 mL/hr over 30 Minutes Intravenous Every 24 hours 04/04/22 0257     04/03/22 1700  ceFEPIme (MAXIPIME) 2 g in  sodium chloride 0.9 % 100 mL IVPB  Status:  Discontinued        2 g 200 mL/hr over 30 Minutes Intravenous Every 8 hours 04/03/22 1634 04/04/22 0257   04/03/22 1500  metroNIDAZOLE (FLAGYL) IVPB 500 mg  Status:  Discontinued        500 mg 100 mL/hr over 60 Minutes Intravenous Every 12 hours 04/03/22 1446 04/04/22 0257   04/03/22 1045  ceFEPIme (MAXIPIME) 2 g in sodium chloride 0.9 % 100 mL IVPB        2 g 200 mL/hr over 30 Minutes Intravenous  Once 04/03/22 1031 04/03/22 1429   04/03/22 1045  vancomycin (VANCOCIN) IVPB 1000 mg/200 mL premix        1,000 mg 200 mL/hr over 60 Minutes Intravenous  Once 04/03/22 1031 04/03/22 1241       Objective:  Vital Signs  Vitals:   04/04/22 0438 04/04/22 0500 04/04/22 0800 04/04/22 0847  BP:  (!) 86/60    Pulse: 77 77    Resp: 17 18    Temp:   97.6 F (36.4 C)   TempSrc:   Axillary   SpO2: 100% 99%    Weight:    49.3 kg  Height:    '5\' 4"'$  (1.626 m)    Intake/Output Summary (Last 24 hours) at 04/04/2022 0933 Last data filed at 04/04/2022 0600 Gross per 24 hour  Intake 1578.14 ml  Output 725 ml  Net 853.14 ml   Filed Weights   04/04/22 0847  Weight: 49.3 kg    General appearance: Awake alert.  In no distress Resp: Clear to auscultation bilaterally.  Normal effort Cardio: S1-S2 is normal regular.  No S3-S4.  No rubs murmurs or bruit GI: Abdomen is soft.  Nontender nondistended.  Bowel sounds are present normal.  No masses organomegaly Extremities: No edema.  Moving all of her extremities Neurologic: No focal neurological deficits.    Lab Results:  Data Reviewed: I have personally reviewed following labs and reports of the imaging studies  CBC: Recent Labs  Lab 04/03/22 1035 04/04/22 0336  WBC 13.3* 6.9  NEUTROABS 11.5*  --   HGB 11.5* 9.5*  HCT 33.8* 28.2*  MCV 99.1 99.3  PLT 72* 39*    Basic Metabolic Panel: Recent Labs  Lab 04/03/22 1126 04/04/22 0336 04/04/22 0744  NA 137 140  --   K 3.5 3.4*  --   CL 105  115*  --   CO2 24 21*  --   GLUCOSE 128* 130*  --   BUN 38* 32*  --   CREATININE 0.72 0.67  --   CALCIUM 8.3* 7.6*  --   MG  --   --  2.1    GFR: Estimated Creatinine Clearance: 58.2 mL/min (by C-G formula based on SCr of 0.67 mg/dL).  Liver Function Tests: Recent Labs  Lab 04/03/22 1126 04/04/22 0336  AST 33 23  ALT 18 15  ALKPHOS 83 67  BILITOT 2.7* 2.0*  PROT 5.6* 4.3*  ALBUMIN 2.6* 2.1*    Recent Labs  Lab 04/03/22 1126  LIPASE 44   Recent Labs  Lab 04/03/22 1426  AMMONIA 34    Coagulation Profile: Recent Labs  Lab 04/03/22 1035 04/04/22 0336  INR 3.0* 4.0*     Recent Results (from the past 240 hour(s))  Culture, blood (routine x 2)     Status: None (Preliminary result)   Collection Time: 04/03/22 10:40 AM   Specimen: BLOOD  Result Value Ref Range Status   Specimen Description   Final    BLOOD RIGHT ANTECUBITAL Performed at [facility] [address], [place], [place] [address]    Special Requests   Final    BOTTLES DRAWN AEROBIC AND ANAEROBIC Blood Culture results may not be optimal due to an inadequate volume of blood received in culture bottles Performed at [facility] [address] [place], [place] [address]    Culture  Setup Time   Final    GRAM NEGATIVE RODS IN BOTH AEROBIC AND ANAEROBIC BOTTLES CRITICAL VALUE NOTED.  VALUE IS CONSISTENT WITH PREVIOUSLY REPORTED AND CALLED VALUE.    Culture   Final    NO GROWTH < 24 HOURS Performed at [facility], [place] [address], [place], [place] [address]    Report Status PENDING  Incomplete  Culture, blood (routine x 2)     Status: Abnormal (Preliminary result)   Collection Time: 04/03/22 10:40 AM   Specimen: BLOOD  Result Value Ref Range Status   Specimen Description   Final    BLOOD LEFT ANTECUBITAL Performed at [facility] [address], [place], [place] [address]    Special Requests   Final    BOTTLES DRAWN AEROBIC AND  ANAEROBIC Blood Culture adequate volume Performed at [facility] [address], [place], [place] [address]    Culture  Setup Time   Final    GRAM NEGATIVE RODS IN BOTH AEROBIC AND ANAEROBIC BOTTLES Organism ID to follow CRITICAL RESULT CALLED TO, READ BACK BY AND VERIFIED WITH: M LILLISTON,PHARMD'@0138'$  04/04/22 [place]    Culture (A)  Final    ESCHERICHIA COLI CULTURE REINCUBATED FOR BETTER GROWTH Performed at [facility], [place] [address] [place], [place] [address]    Report Status PENDING  Incomplete  Blood Culture ID Panel (Reflexed)     Status: Abnormal   Collection Time: 04/03/22 10:40 AM  Result Value Ref Range Status   Enterococcus faecalis NOT DETECTED NOT DETECTED Final   Enterococcus Faecium NOT DETECTED NOT DETECTED Final   Listeria monocytogenes NOT DETECTED NOT DETECTED Final   Staphylococcus species NOT DETECTED NOT DETECTED Final   Staphylococcus aureus (BCID) NOT DETECTED NOT DETECTED Final   Staphylococcus epidermidis NOT DETECTED NOT DETECTED Final   Staphylococcus lugdunensis NOT DETECTED NOT DETECTED Final   Streptococcus species NOT DETECTED NOT DETECTED Final   Streptococcus agalactiae NOT DETECTED NOT DETECTED Final   Streptococcus pneumoniae NOT DETECTED NOT DETECTED Final   Streptococcus pyogenes NOT DETECTED NOT DETECTED Final   A.calcoaceticus-baumannii NOT DETECTED NOT DETECTED Final   Bacteroides fragilis NOT DETECTED NOT DETECTED Final   Enterobacterales DETECTED (A) NOT DETECTED Final    Comment: Enterobacterales represent a large order of gram negative bacteria, not a single organism. CRITICAL RESULT CALLED TO, READ BACK BY AND VERIFIED WITH: M LILLISTON,PHARMD'@0138'$  04/04/22 [place]    Enterobacter cloacae complex NOT DETECTED NOT DETECTED Final   Escherichia coli DETECTED (A) NOT DETECTED Final    Comment: CRITICAL RESULT CALLED TO, READ BACK BY AND VERIFIED WITH: M LILLISTON,PHARMD'@0138'$  04/04/22 [place]    Klebsiella aerogenes NOT  DETECTED NOT DETECTED Final   Klebsiella oxytoca NOT DETECTED  NOT DETECTED Final   Klebsiella pneumoniae NOT DETECTED NOT DETECTED Final   Proteus species NOT DETECTED NOT DETECTED Final   Salmonella species NOT DETECTED NOT DETECTED Final   Serratia marcescens NOT DETECTED NOT DETECTED Final   Haemophilus influenzae NOT DETECTED NOT DETECTED Final   Neisseria meningitidis NOT DETECTED NOT DETECTED Final   Pseudomonas aeruginosa NOT DETECTED NOT DETECTED Final   Stenotrophomonas maltophilia NOT DETECTED NOT DETECTED Final   Candida albicans NOT DETECTED NOT DETECTED Final   Candida auris NOT DETECTED NOT DETECTED Final   Candida glabrata NOT DETECTED NOT DETECTED Final   Candida krusei NOT DETECTED NOT DETECTED Final   Candida parapsilosis NOT DETECTED NOT DETECTED Final   Candida tropicalis NOT DETECTED NOT DETECTED Final   Cryptococcus neoformans/gattii NOT DETECTED NOT DETECTED Final   CTX-M ESBL NOT DETECTED NOT DETECTED Final   Carbapenem resistance IMP NOT DETECTED NOT DETECTED Final   Carbapenem resistance KPC NOT DETECTED NOT DETECTED Final   Carbapenem resistance NDM NOT DETECTED NOT DETECTED Final   Carbapenem resist OXA 48 LIKE NOT DETECTED NOT DETECTED Final   Carbapenem resistance VIM NOT DETECTED NOT DETECTED Final    Comment: Performed at [facility], [address] [place], [place] [address]  Resp Panel by RT-PCR (Flu A&B, Covid) Anterior Nasal Swab     Status: None   Collection Time: 04/03/22 10:47 AM   Specimen: Anterior Nasal Swab  Result Value Ref Range Status   SARS Coronavirus 2 by RT PCR NEGATIVE NEGATIVE Final    Comment: (NOTE) SARS-CoV-2 target nucleic acids are NOT DETECTED.  The SARS-CoV-2 RNA is generally detectable in upper respiratory specimens during the acute phase of infection. The lowest concentration of SARS-CoV-2 viral copies this assay can detect is 138 copies/mL. A negative result does not preclude SARS-Cov-2 infection and should not  be used as the sole basis for treatment or other patient management decisions. A negative result may occur with  improper specimen collection/handling, submission of specimen other than nasopharyngeal swab, presence of viral mutation(s) within the areas targeted by this assay, and inadequate number of viral copies(<138 copies/mL). A negative result must be combined with clinical observations, patient history, and epidemiological information. The expected result is Negative.  Fact Sheet for Patients:  EntrepreneurPulse.com.au  Fact Sheet for Healthcare Providers:  IncredibleEmployment.be  This test is no t yet approved or cleared by the [place] FDA and  has been authorized for detection and/or diagnosis of SARS-CoV-2 by FDA under an Emergency Use Authorization (EUA). This EUA will remain  in effect (meaning this test can be used) for the duration of the COVID-19 declaration under Section 564(b)(1) of the Act, 21 U.S.C.section 360bbb-3(b)(1), unless the authorization is terminated  or revoked sooner.       Influenza A by PCR NEGATIVE NEGATIVE Final   Influenza B by PCR NEGATIVE NEGATIVE Final    Comment: (NOTE) The Xpert Xpress SARS-CoV-2/FLU/RSV plus assay is intended as an aid in the diagnosis of influenza from Nasopharyngeal swab specimens and should not be used as a sole basis for treatment. Nasal washings and aspirates are unacceptable for Xpert Xpress SARS-CoV-2/FLU/RSV testing.  Fact Sheet for Patients: EntrepreneurPulse.com.au  Fact Sheet for Healthcare Providers: IncredibleEmployment.be  This test is not yet approved or cleared by the [place] FDA and has been authorized for detection and/or diagnosis of SARS-CoV-2 by FDA under an Emergency Use Authorization (EUA). This EUA will remain in effect (meaning this test can be used) for the  duration of the COVID-19 declaration under Section  564(b)(1) of the Act, 21 U.S.C. section 360bbb-3(b)(1), unless the authorization is terminated or revoked.  Performed at [facility], [place] [address] [place], [place] [address]   C Difficile Quick Screen w PCR reflex     Status: None   Collection Time: 04/03/22  2:53 PM   Specimen: Nasal Mucosa; Stool  Result Value Ref Range Status   C Diff antigen NEGATIVE NEGATIVE Final   C Diff toxin NEGATIVE NEGATIVE Final   C Diff interpretation No C. difficile detected.  Final    Comment: Performed at [facility], [place] [address], [place], [place] [address]  Gastrointestinal Panel by PCR , Stool     Status: None   Collection Time: 04/03/22  2:54 PM   Specimen: Nasal Mucosa; Stool  Result Value Ref Range Status   Campylobacter species NOT DETECTED NOT DETECTED Final   Plesimonas shigelloides NOT DETECTED NOT DETECTED Final   Salmonella species NOT DETECTED NOT DETECTED Final   Yersinia enterocolitica NOT DETECTED NOT DETECTED Final   Vibrio species NOT DETECTED NOT DETECTED Final   Vibrio cholerae NOT DETECTED NOT DETECTED Final   Enteroaggregative E coli (EAEC) NOT DETECTED NOT DETECTED Final   Enteropathogenic E coli (EPEC) NOT DETECTED NOT DETECTED Final   Enterotoxigenic E coli (ETEC) NOT DETECTED NOT DETECTED Final   Shiga like toxin producing E coli (STEC) NOT DETECTED NOT DETECTED Final   Shigella/Enteroinvasive E coli (EIEC) NOT DETECTED NOT DETECTED Final   Cryptosporidium NOT DETECTED NOT DETECTED Final   Cyclospora cayetanensis NOT DETECTED NOT DETECTED Final   Entamoeba histolytica NOT DETECTED NOT DETECTED Final   Giardia lamblia NOT DETECTED NOT DETECTED Final   Adenovirus F40/41 NOT DETECTED NOT DETECTED Final   Astrovirus NOT DETECTED NOT DETECTED Final   Norovirus GI/GII NOT DETECTED NOT DETECTED Final   Rotavirus A NOT DETECTED NOT DETECTED Final   Sapovirus (I, II, IV, and V) NOT DETECTED NOT DETECTED Final    Comment: Performed at  [facility], [place] [place], [place] [address]  MRSA Next Gen by PCR, Nasal     Status: None   Collection Time: 04/03/22  8:36 PM   Specimen: Nasal Mucosa; Nasal Swab  Result Value Ref Range Status   MRSA by PCR Next Gen NOT DETECTED NOT DETECTED Final    Comment: (NOTE) The GeneXpert MRSA Assay (FDA approved for NASAL specimens only), is one component of a comprehensive MRSA colonization surveillance program. It is not intended to diagnose MRSA infection nor to guide or monitor treatment for MRSA infections. Test performance is not FDA approved in patients less than 9 years old. Performed at [facility], [place] [address], [place], [place] [address]       Radiology Studies: CT Angio Chest PE W and/or Wo Contrast  Result Date: 04/03/2022 CLINICAL DATA:  Hypoxia. EXAM: CT ANGIOGRAPHY CHEST WITH CONTRAST TECHNIQUE: Multidetector CT imaging of the chest was performed using the standard protocol during bolus administration of intravenous contrast. Multiplanar CT image reconstructions and MIPs were obtained to evaluate the vascular anatomy. RADIATION DOSE REDUCTION: This exam was performed according to the departmental dose-optimization program which includes automated exposure control, adjustment of the mA and/or kV according to patient size and/or use of iterative reconstruction technique. CONTRAST:  188m OMNIPAQUE IOHEXOL 300 MG/ML  SOLN COMPARISON:  March 18, 2022. FINDINGS: Cardiovascular: Satisfactory opacification of the pulmonary arteries to the segmental level. No evidence of  pulmonary embolism. Normal heart size. No pericardial effusion. Mediastinum/Nodes: No enlarged mediastinal, hilar, or axillary lymph nodes. Thyroid gland, trachea, and esophagus demonstrate no significant findings. Lungs/Pleura: Lungs are clear. No pleural effusion or pneumothorax. Upper Abdomen: No acute abnormality. Musculoskeletal: No chest wall abnormality. No acute or  significant osseous findings. Review of the MIP images confirms the above findings. IMPRESSION: No definite evidence of pulmonary embolus. No definite abnormality seen in the chest. Electronically Signed   By: Marijo Conception M.D.   On: 04/03/2022 14:41   CT ABDOMEN PELVIS W CONTRAST  Result Date: 04/03/2022 CLINICAL DATA:  Weakness, hypoxia, metastatic colon cancer * Tracking Code: BO * EXAM: CT ABDOMEN AND PELVIS WITH CONTRAST TECHNIQUE: Multidetector CT imaging of the abdomen and pelvis was performed using the standard protocol following bolus administration of intravenous contrast. RADIATION DOSE REDUCTION: This exam was performed according to the departmental dose-optimization program which includes automated exposure control, adjustment of the mA and/or kV according to patient size and/or use of iterative reconstruction technique. CONTRAST:  14m OMNIPAQUE IOHEXOL 300 MG/ML  SOLN COMPARISON:  CT chest abdomen pelvis, 03/18/2022 FINDINGS: Lower chest: No acute abnormality. Please see separately reported examination of the chest. Hepatobiliary: No solid liver abnormality is seen. No gallstones, gallbladder wall thickening, or biliary dilatation. Pancreas: Unremarkable. No pancreatic ductal dilatation or surrounding inflammatory changes. Spleen: Splenomegaly, maximum coronal span 14.7 cm. Adrenals/Urinary Tract: Adrenal glands are unremarkable. Kidneys are normal, without renal calculi, solid lesion, or hydronephrosis. Mildly distended urinary bladder. Stomach/Bowel: Stomach is within normal limits. Appendix appears normal. New, diffuse, long segment wall thickening of the colon, involving the cecum through the splenic flexure (series 7, image 29, 17, series 4, image 55). Unchanged circumferential mass of the rectosigmoid junction (series 4, image 73). Vascular/Lymphatic: Aortic atherosclerosis. Unchanged left mesorectal or pelvic sidewall lymph node measuring 3.6 x 2.3 cm (series 4, image 79). Unchanged  prominent subcentimeter retroperitoneal lymph nodes (series 4, image 46). Reproductive: No mass or other significant abnormality. Other: No abdominal wall hernia or abnormality. No ascites. Musculoskeletal: No acute or significant osseous findings. IMPRESSION: 1. New, diffuse, long segment wall thickening of the colon, involving the cecum through the splenic flexure. Findings are consistent with nonspecific infectious, inflammatory, or ischemic colitis. 2. Unchanged circumferential mass of the rectosigmoid junction. Unchanged enlarged left mesorectal or pelvic sidewall lymph node. 3. Distended urinary bladder.  Correlate for urinary retention. 4. Splenomegaly. Aortic Atherosclerosis (ICD10-I70.0). Electronically Signed   By: ADelanna AhmadiM.D.   On: 04/03/2022 14:40   DG Chest Port 1 View  Result Date: 04/03/2022 CLINICAL DATA:  Weakness EXAM: PORTABLE CHEST 1 VIEW COMPARISON:  11/18/2021 FINDINGS: The heart size and mediastinal contours are within normal limits. Both lungs are clear. The visualized skeletal structures are unremarkable. IMPRESSION: No acute abnormality lungs in AP portable projection. Electronically Signed   By: ADelanna AhmadiM.D.   On: 04/03/2022 10:50       LOS: 1 day   Gil Ingwersen KSe[employer] Air Corporationon [email or web address]  04/04/2022, 9:33 AM

## 2022-04-04 NOTE — Progress Notes (Addendum)
Angela Mcpherson   DOB:Aug 07, 1961   FK#:[id number]   FVC#:[id number]  Oncology follow up   Subjective: Pt was admitted for sepsis and metabolic cephalopathy yesterday.  Her mental status is much better than yesterday, she is alert and oriented and answers questions appropriately, she does not remember everything today (such as her husband's phone number). She denies pain, VS are stable   Objective:  Vitals:   04/04/22 1800 04/04/22 1936  BP: 128/74 124/84  Pulse: (!) 105 (!) 104  Resp: (!) 27 18  Temp:  99 F (37.2 C)  SpO2: 97% 98%    Body mass index is 18.66 kg/m.  Intake/Output Summary (Last 24 hours) at 04/04/2022 2314 Last data filed at 04/04/2022 1846 Gross per 24 hour  Intake 1637.02 ml  Output 1425 ml  Net 212.02 ml     Sclerae unicteric  Oropharynx clear  No peripheral adenopathy  Lungs clear -- no rales or rhonchi  Heart regular rate and rhythm  Abdomen benign  MSK no focal spinal tenderness, no peripheral edema  Neuro nonfocal    CBG (last 3)  No results for input(s): "GLUCAP" in the last 72 hours.   Labs:   Urine Studies No results for input(s): "UHGB", "CRYS" in the last 72 hours.  Invalid input(s): "UACOL", "UAPR", "USPG", "UPH", "UTP", "UGL", "UKET", "UBIL", "UNIT", "UROB", "ULEU", "UEPI", "UWBC", "URBC", "UBAC", "CAST", "UCOM", "BILUA"  Basic Metabolic Panel: Recent Labs  Lab 04/03/22 1126 04/04/22 0336 04/04/22 0744  NA 137 140  --   K 3.5 3.4*  --   CL 105 115*  --   CO2 24 21*  --   GLUCOSE 128* 130*  --   BUN 38* 32*  --   CREATININE 0.72 0.67  --   CALCIUM 8.3* 7.6*  --   MG  --   --  2.1   GFR Estimated Creatinine Clearance: 58.2 mL/min (by C-G formula based on SCr of 0.67 mg/dL). Liver Function Tests: Recent Labs  Lab 04/03/22 1126 04/04/22 0336  AST 33 23  ALT 18 15  ALKPHOS 83 67  BILITOT 2.7* 2.0*  PROT 5.6* 4.3*  ALBUMIN 2.6* 2.1*   Recent Labs  Lab 04/03/22 1126  LIPASE 44   Recent Labs  Lab 04/03/22 1426   AMMONIA 34   Coagulation profile Recent Labs  Lab 04/03/22 1035 04/04/22 0336  INR 3.0* 4.0*    CBC: Recent Labs  Lab 04/03/22 1035 04/04/22 0336  WBC 13.3* 6.9  NEUTROABS 11.5*  --   HGB 11.5* 9.5*  HCT 33.8* 28.2*  MCV 99.1 99.3  PLT 72* 39*   Cardiac Enzymes: No results for input(s): "CKTOTAL", "CKMB", "CKMBINDEX", "TROPONINI" in the last 168 hours. BNP: Invalid input(s): "POCBNP" CBG: No results for input(s): "GLUCAP" in the last 168 hours. D-Dimer No results for input(s): "DDIMER" in the last 72 hours. Hgb A1c No results for input(s): "HGBA1C" in the last 72 hours. Lipid Profile No results for input(s): "CHOL", "HDL", "LDLCALC", "TRIG", "CHOLHDL", "LDLDIRECT" in the last 72 hours. Thyroid function studies No results for input(s): "TSH", "T4TOTAL", "T3FREE", "THYROIDAB" in the last 72 hours.  Invalid input(s): "FREET3" Anemia work up No results for input(s): "VITAMINB12", "FOLATE", "FERRITIN", "TIBC", "IRON", "RETICCTPCT" in the last 72 hours. Microbiology Recent Results (from the past 240 hour(s))  Culture, blood (routine x 2)     Status: None (Preliminary result)   Collection Time: 04/03/22 10:40 AM   Specimen: BLOOD  Result Value Ref Range Status   Specimen Description  Final    BLOOD RIGHT ANTECUBITAL Performed at [facility] [address], [place], [place] [address]    Special Requests   Final    BOTTLES DRAWN AEROBIC AND ANAEROBIC Blood Culture results may not be optimal due to an inadequate volume of blood received in culture bottles Performed at [facility] [address], [place], [place] [address]    Culture  Setup Time   Final    GRAM NEGATIVE RODS IN BOTH AEROBIC AND ANAEROBIC BOTTLES CRITICAL VALUE NOTED.  VALUE IS CONSISTENT WITH PREVIOUSLY REPORTED AND CALLED VALUE.    Culture   Final    NO GROWTH < 24 HOURS Performed at [facility], [place] [address] [place], [place] [address]     Report Status PENDING  Incomplete  Culture, blood (routine x 2)     Status: Abnormal (Preliminary result)   Collection Time: 04/03/22 10:40 AM   Specimen: BLOOD  Result Value Ref Range Status   Specimen Description   Final    BLOOD LEFT ANTECUBITAL Performed at [facility] [address] [place], [place] [address]    Special Requests   Final    BOTTLES DRAWN AEROBIC AND ANAEROBIC Blood Culture adequate volume Performed at [facility] [address], [place], [place] [address]    Culture  Setup Time   Final    GRAM NEGATIVE RODS IN BOTH AEROBIC AND ANAEROBIC BOTTLES Organism ID to follow CRITICAL RESULT CALLED TO, READ BACK BY AND VERIFIED WITH: M LILLISTON,PHARMD'@0138'$  04/04/22 [place]    Culture (A)  Final    ESCHERICHIA COLI CULTURE REINCUBATED FOR BETTER GROWTH Performed at [facility], [place] [address], [place], [place] [address]    Report Status PENDING  Incomplete  Blood Culture ID Panel (Reflexed)     Status: Abnormal   Collection Time: 04/03/22 10:40 AM  Result Value Ref Range Status   Enterococcus faecalis NOT DETECTED NOT DETECTED Final   Enterococcus Faecium NOT DETECTED NOT DETECTED Final   Listeria monocytogenes NOT DETECTED NOT DETECTED Final   Staphylococcus species NOT DETECTED NOT DETECTED Final   Staphylococcus aureus (BCID) NOT DETECTED NOT DETECTED Final   Staphylococcus epidermidis NOT DETECTED NOT DETECTED Final   Staphylococcus lugdunensis NOT DETECTED NOT DETECTED Final   Streptococcus species NOT DETECTED NOT DETECTED Final   Streptococcus agalactiae NOT DETECTED NOT DETECTED Final   Streptococcus pneumoniae NOT DETECTED NOT DETECTED Final   Streptococcus pyogenes NOT DETECTED NOT DETECTED Final   A.calcoaceticus-baumannii NOT DETECTED NOT DETECTED Final   Bacteroides fragilis NOT DETECTED NOT DETECTED Final   Enterobacterales DETECTED (A) NOT DETECTED Final    Comment: Enterobacterales represent a large  order of gram negative bacteria, not a single organism. CRITICAL RESULT CALLED TO, READ BACK BY AND VERIFIED WITH: M LILLISTON,PHARMD'@0138'$  04/04/22 [place]    Enterobacter cloacae complex NOT DETECTED NOT DETECTED Final   Escherichia coli DETECTED (A) NOT DETECTED Final    Comment: CRITICAL RESULT CALLED TO, READ BACK BY AND VERIFIED WITH: M LILLISTON,PHARMD'@0138'$  04/04/22 [place]    Klebsiella aerogenes NOT DETECTED NOT DETECTED Final   Klebsiella oxytoca NOT DETECTED NOT DETECTED Final   Klebsiella pneumoniae NOT DETECTED NOT DETECTED Final   Proteus species NOT DETECTED NOT DETECTED Final   Salmonella species NOT DETECTED NOT DETECTED Final   Serratia marcescens NOT DETECTED NOT DETECTED Final   Haemophilus influenzae NOT DETECTED NOT DETECTED Final   Neisseria meningitidis NOT DETECTED NOT DETECTED Final  Pseudomonas aeruginosa NOT DETECTED NOT DETECTED Final   Stenotrophomonas maltophilia NOT DETECTED NOT DETECTED Final   Candida albicans NOT DETECTED NOT DETECTED Final   Candida auris NOT DETECTED NOT DETECTED Final   Candida glabrata NOT DETECTED NOT DETECTED Final   Candida krusei NOT DETECTED NOT DETECTED Final   Candida parapsilosis NOT DETECTED NOT DETECTED Final   Candida tropicalis NOT DETECTED NOT DETECTED Final   Cryptococcus neoformans/gattii NOT DETECTED NOT DETECTED Final   CTX-M ESBL NOT DETECTED NOT DETECTED Final   Carbapenem resistance IMP NOT DETECTED NOT DETECTED Final   Carbapenem resistance KPC NOT DETECTED NOT DETECTED Final   Carbapenem resistance NDM NOT DETECTED NOT DETECTED Final   Carbapenem resist OXA 48 LIKE NOT DETECTED NOT DETECTED Final   Carbapenem resistance VIM NOT DETECTED NOT DETECTED Final    Comment: Performed at [facility], [address], [place], [place] [address]  Resp Panel by RT-PCR (Flu A&B, Covid) Anterior Nasal Swab     Status: None   Collection Time: 04/03/22 10:47 AM   Specimen: Anterior Nasal Swab  Result Value Ref Range Status    SARS Coronavirus 2 by RT PCR NEGATIVE NEGATIVE Final    Comment: (NOTE) SARS-CoV-2 target nucleic acids are NOT DETECTED.  The SARS-CoV-2 RNA is generally detectable in upper respiratory specimens during the acute phase of infection. The lowest concentration of SARS-CoV-2 viral copies this assay can detect is 138 copies/mL. A negative result does not preclude SARS-Cov-2 infection and should not be used as the sole basis for treatment or other patient management decisions. A negative result may occur with  improper specimen collection/handling, submission of specimen other than nasopharyngeal swab, presence of viral mutation(s) within the areas targeted by this assay, and inadequate number of viral copies(<138 copies/mL). A negative result must be combined with clinical observations, patient history, and epidemiological information. The expected result is Negative.  Fact Sheet for Patients:  EntrepreneurPulse.com.au  Fact Sheet for Healthcare Providers:  IncredibleEmployment.be  This test is no t yet approved or cleared by the [place] FDA and  has been authorized for detection and/or diagnosis of SARS-CoV-2 by FDA under an Emergency Use Authorization (EUA). This EUA will remain  in effect (meaning this test can be used) for the duration of the COVID-19 declaration under Section 564(b)(1) of the Act, 21 U.S.C.section 360bbb-3(b)(1), unless the authorization is terminated  or revoked sooner.       Influenza A by PCR NEGATIVE NEGATIVE Final   Influenza B by PCR NEGATIVE NEGATIVE Final    Comment: (NOTE) The Xpert Xpress SARS-CoV-2/FLU/RSV plus assay is intended as an aid in the diagnosis of influenza from Nasopharyngeal swab specimens and should not be used as a sole basis for treatment. Nasal washings and aspirates are unacceptable for Xpert Xpress SARS-CoV-2/FLU/RSV testing.  Fact Sheet for  Patients: EntrepreneurPulse.com.au  Fact Sheet for Healthcare Providers: IncredibleEmployment.be  This test is not yet approved or cleared by the [place] FDA and has been authorized for detection and/or diagnosis of SARS-CoV-2 by FDA under an Emergency Use Authorization (EUA). This EUA will remain in effect (meaning this test can be used) for the duration of the COVID-19 declaration under Section 564(b)(1) of the Act, 21 U.S.C. section 360bbb-3(b)(1), unless the authorization is terminated or revoked.  Performed at [facility], [place] [address], [place], [place] [address]   C Difficile Quick Screen w PCR reflex     Status: None   Collection Time: 04/03/22  2:53  PM   Specimen: Nasal Mucosa; Stool  Result Value Ref Range Status   C Diff antigen NEGATIVE NEGATIVE Final   C Diff toxin NEGATIVE NEGATIVE Final   C Diff interpretation No C. difficile detected.  Final    Comment: Performed at [facility], [place] [address], [place], [place] [address]  Gastrointestinal Panel by PCR , Stool     Status: None   Collection Time: 04/03/22  2:54 PM   Specimen: Nasal Mucosa; Stool  Result Value Ref Range Status   Campylobacter species NOT DETECTED NOT DETECTED Final   Plesimonas shigelloides NOT DETECTED NOT DETECTED Final   Salmonella species NOT DETECTED NOT DETECTED Final   Yersinia enterocolitica NOT DETECTED NOT DETECTED Final   Vibrio species NOT DETECTED NOT DETECTED Final   Vibrio cholerae NOT DETECTED NOT DETECTED Final   Enteroaggregative E coli (EAEC) NOT DETECTED NOT DETECTED Final   Enteropathogenic E coli (EPEC) NOT DETECTED NOT DETECTED Final   Enterotoxigenic E coli (ETEC) NOT DETECTED NOT DETECTED Final   Shiga like toxin producing E coli (STEC) NOT DETECTED NOT DETECTED Final   Shigella/Enteroinvasive E coli (EIEC) NOT DETECTED NOT DETECTED Final   Cryptosporidium NOT DETECTED NOT DETECTED Final    Cyclospora cayetanensis NOT DETECTED NOT DETECTED Final   Entamoeba histolytica NOT DETECTED NOT DETECTED Final   Giardia lamblia NOT DETECTED NOT DETECTED Final   Adenovirus F40/41 NOT DETECTED NOT DETECTED Final   Astrovirus NOT DETECTED NOT DETECTED Final   Norovirus GI/GII NOT DETECTED NOT DETECTED Final   Rotavirus A NOT DETECTED NOT DETECTED Final   Sapovirus (I, II, IV, and V) NOT DETECTED NOT DETECTED Final    Comment: Performed at [facility], [address], [place], [place] [address]  Urine Culture     Status: Abnormal (Preliminary result)   Collection Time: 04/03/22  3:19 PM   Specimen: Urine, Catheterized  Result Value Ref Range Status   Specimen Description   Final    URINE, CATHETERIZED Performed at [facility], [place] [address] [place], [place] [address]    Special Requests   Final    NONE Performed at [facility], [place] [address], [place], [place] [address]    Culture 20,000 COLONIES/mL KLEBSIELLA OXYTOCA (A)  Final   Report Status PENDING  Incomplete  MRSA Next Gen by PCR, Nasal     Status: None   Collection Time: 04/03/22  8:36 PM   Specimen: Nasal Mucosa; Nasal Swab  Result Value Ref Range Status   MRSA by PCR Next Gen NOT DETECTED NOT DETECTED Final    Comment: (NOTE) The GeneXpert MRSA Assay (FDA approved for NASAL specimens only), is one component of a comprehensive MRSA colonization surveillance program. It is not intended to diagnose MRSA infection nor to guide or monitor treatment for MRSA infections. Test performance is not FDA approved in patients less than 75 years old. Performed at [facility], [place] [address], [place], [place] [address]       Studies:  CT Angio Chest PE W and/or Wo Contrast  Result Date: 04/03/2022 CLINICAL DATA:  Hypoxia. EXAM: CT ANGIOGRAPHY CHEST WITH CONTRAST TECHNIQUE: Multidetector CT imaging of the chest was performed using the standard protocol during  bolus administration of intravenous contrast. Multiplanar CT image reconstructions and MIPs were obtained to evaluate the vascular anatomy. RADIATION DOSE REDUCTION: This exam was performed according to the departmental dose-optimization program which includes automated exposure control, adjustment of the mA and/or  kV according to patient size and/or use of iterative reconstruction technique. CONTRAST:  163m OMNIPAQUE IOHEXOL 300 MG/ML  SOLN COMPARISON:  March 18, 2022. FINDINGS: Cardiovascular: Satisfactory opacification of the pulmonary arteries to the segmental level. No evidence of pulmonary embolism. Normal heart size. No pericardial effusion. Mediastinum/Nodes: No enlarged mediastinal, hilar, or axillary lymph nodes. Thyroid gland, trachea, and esophagus demonstrate no significant findings. Lungs/Pleura: Lungs are clear. No pleural effusion or pneumothorax. Upper Abdomen: No acute abnormality. Musculoskeletal: No chest wall abnormality. No acute or significant osseous findings. Review of the MIP images confirms the above findings. IMPRESSION: No definite evidence of pulmonary embolus. No definite abnormality seen in the chest. Electronically Signed   By: JMarijo ConceptionM.D.   On: 04/03/2022 14:41   CT ABDOMEN PELVIS W CONTRAST  Result Date: 04/03/2022 CLINICAL DATA:  Weakness, hypoxia, metastatic colon cancer * Tracking Code: BO * EXAM: CT ABDOMEN AND PELVIS WITH CONTRAST TECHNIQUE: Multidetector CT imaging of the abdomen and pelvis was performed using the standard protocol following bolus administration of intravenous contrast. RADIATION DOSE REDUCTION: This exam was performed according to the departmental dose-optimization program which includes automated exposure control, adjustment of the mA and/or kV according to patient size and/or use of iterative reconstruction technique. CONTRAST:  102mOMNIPAQUE IOHEXOL 300 MG/ML  SOLN COMPARISON:  CT chest abdomen pelvis, 03/18/2022 FINDINGS: Lower chest: No  acute abnormality. Please see separately reported examination of the chest. Hepatobiliary: No solid liver abnormality is seen. No gallstones, gallbladder wall thickening, or biliary dilatation. Pancreas: Unremarkable. No pancreatic ductal dilatation or surrounding inflammatory changes. Spleen: Splenomegaly, maximum coronal span 14.7 cm. Adrenals/Urinary Tract: Adrenal glands are unremarkable. Kidneys are normal, without renal calculi, solid lesion, or hydronephrosis. Mildly distended urinary bladder. Stomach/Bowel: Stomach is within normal limits. Appendix appears normal. New, diffuse, long segment wall thickening of the colon, involving the cecum through the splenic flexure (series 7, image 29, 17, series 4, image 55). Unchanged circumferential mass of the rectosigmoid junction (series 4, image 73). Vascular/Lymphatic: Aortic atherosclerosis. Unchanged left mesorectal or pelvic sidewall lymph node measuring 3.6 x 2.3 cm (series 4, image 79). Unchanged prominent subcentimeter retroperitoneal lymph nodes (series 4, image 46). Reproductive: No mass or other significant abnormality. Other: No abdominal wall hernia or abnormality. No ascites. Musculoskeletal: No acute or significant osseous findings. IMPRESSION: 1. New, diffuse, long segment wall thickening of the colon, involving the cecum through the splenic flexure. Findings are consistent with nonspecific infectious, inflammatory, or ischemic colitis. 2. Unchanged circumferential mass of the rectosigmoid junction. Unchanged enlarged left mesorectal or pelvic sidewall lymph node. 3. Distended urinary bladder.  Correlate for urinary retention. 4. Splenomegaly. Aortic Atherosclerosis (ICD10-I70.0). Electronically Signed   By: AlDelanna Ahmadi.D.   On: 04/03/2022 14:40   DG Chest Port 1 View  Result Date: 04/03/2022 CLINICAL DATA:  Weakness EXAM: PORTABLE CHEST 1 VIEW COMPARISON:  11/18/2021 FINDINGS: The heart size and mediastinal contours are within normal limits.  Both lungs are clear. The visualized skeletal structures are unremarkable. IMPRESSION: No acute abnormality lungs in AP portable projection. Electronically Signed   By: AlDelanna Ahmadi.D.   On: 04/03/2022 10:50    Assessment: [age].o. female   Sepsis secondary to bacteremia Acute colitis Metabolic encephalopathy due to sepsis, near resolved Left-sided colon cancer, on chemotherapy Normocytic anemia and thrombocytopenia, due to underlying malignancy and chemotherapy Liver cirrhosis, HCV infection   Plan:  -she is on appropriate antibiotics based on the blood culture  -Clinically improving -she is certainly frail ,  developed bacteremia and sepsis from low-dose oral chemo therapy.  We orangey replanted to add oxaliplatin on next cycle of mild cancer progression, but we will likely hold on for a while due to her infection and hospitalization. -I will f/u again on Tuesday when I return to office. Please call my on-call partner if needed.  -I called and spoke with her husband today and updated him.    Truitt Merle, MD 04/04/2022

## 2022-04-04 NOTE — Progress Notes (Signed)
PHARMACY - PHYSICIAN COMMUNICATION CRITICAL VALUE ALERT - BLOOD CULTURE IDENTIFICATION (BCID)  Angela Mcpherson is an 61 y.o. female who presented to [facility] on 04/03/2022 with a chief complaint of abdominal pain, confusion.   Assessment:   Admit with sepsis secondary to sepsis.  Blood cx x2 sets + GNR; BCID + Ecoli, no resistance.  Cdiff PCR was negative.  Name of physician (or Provider) Contacted: Clarene Essex, NP  Current antibiotics: Cefepime + Flagyl  Changes to prescribed antibiotics recommended:  De-escalate Cefepime to Ceftriaxone 2gm IV q24h Consider d/c metronidazole  Results for orders placed or performed during the hospital encounter of 04/03/22  Blood Culture ID Panel (Reflexed) (Collected: 04/03/2022 10:40 AM)  Result Value Ref Range   Enterococcus faecalis NOT DETECTED NOT DETECTED   Enterococcus Faecium NOT DETECTED NOT DETECTED   Listeria monocytogenes NOT DETECTED NOT DETECTED   Staphylococcus species NOT DETECTED NOT DETECTED   Staphylococcus aureus (BCID) NOT DETECTED NOT DETECTED   Staphylococcus epidermidis NOT DETECTED NOT DETECTED   Staphylococcus lugdunensis NOT DETECTED NOT DETECTED   Streptococcus species NOT DETECTED NOT DETECTED   Streptococcus agalactiae NOT DETECTED NOT DETECTED   Streptococcus pneumoniae NOT DETECTED NOT DETECTED   Streptococcus pyogenes NOT DETECTED NOT DETECTED   A.calcoaceticus-baumannii NOT DETECTED NOT DETECTED   Bacteroides fragilis NOT DETECTED NOT DETECTED   Enterobacterales DETECTED (A) NOT DETECTED   Enterobacter cloacae complex NOT DETECTED NOT DETECTED   Escherichia coli DETECTED (A) NOT DETECTED   Klebsiella aerogenes NOT DETECTED NOT DETECTED   Klebsiella oxytoca NOT DETECTED NOT DETECTED   Klebsiella pneumoniae NOT DETECTED NOT DETECTED   Proteus species NOT DETECTED NOT DETECTED   Salmonella species NOT DETECTED NOT DETECTED   Serratia marcescens NOT DETECTED NOT DETECTED   Haemophilus influenzae NOT DETECTED NOT  DETECTED   Neisseria meningitidis NOT DETECTED NOT DETECTED   Pseudomonas aeruginosa NOT DETECTED NOT DETECTED   Stenotrophomonas maltophilia NOT DETECTED NOT DETECTED   Candida albicans NOT DETECTED NOT DETECTED   Candida auris NOT DETECTED NOT DETECTED   Candida glabrata NOT DETECTED NOT DETECTED   Candida krusei NOT DETECTED NOT DETECTED   Candida parapsilosis NOT DETECTED NOT DETECTED   Candida tropicalis NOT DETECTED NOT DETECTED   Cryptococcus neoformans/gattii NOT DETECTED NOT DETECTED   CTX-M ESBL NOT DETECTED NOT DETECTED   Carbapenem resistance IMP NOT DETECTED NOT DETECTED   Carbapenem resistance KPC NOT DETECTED NOT DETECTED   Carbapenem resistance NDM NOT DETECTED NOT DETECTED   Carbapenem resist OXA 48 LIKE NOT DETECTED NOT DETECTED   Carbapenem resistance VIM NOT DETECTED NOT DETECTED    Netta Cedars PharmD 04/04/2022  2:25 AM

## 2022-04-04 NOTE — Discharge Instructions (Addendum)
PLEASE HAVE YOUR HOME NURSE REMOVE SUTURES FROM AROUND G TUBE ON 04/30/22   Nutrition [facility] Stay Proper nutrition can help your body recover from illness and injury.   Foods and beverages high in protein, vitamins, and minerals help rebuild muscle loss, promote healing, & reduce fall risk.   In addition to eating healthy foods, a nutrition shake is an easy, delicious way to get the nutrition you need during and after your hospital stay  It is recommended that you continue to drink 2 bottles per day of: Ensure Plus or similar for at least 1 month (30 days) after your hospital stay   Tips for adding a nutrition shake into your routine: As allowed, drink one with vitamins or medications instead of water or juice Enjoy one as a tasty mid-morning or afternoon snack Drink cold or make a milkshake out of it Drink one instead of milk with cereal or snacks Use as a coffee creamer   Available at the following grocery stores and pharmacies:           * [facility] [phone]            For COUPONS visit: www.ensure.com/join or [email or web address]-[email or web address]   Suggested Substitutions Ensure Plus = Boost Plus = Carnation Breakfast Essentials = Boost Compact Ensure Active Clear = Boost Breeze Glucerna Shake = Boost Glucose Control = Carnation Breakfast Essentials SUGAR FREE    [facility]      [facility], [place] [phone]  OPEN ABDOMINAL SURGERY: POST OP INSTRUCTIONS  Always review your discharge instruction sheet given to you by the facility where your surgery was performed.  IF YOU HAVE DISABILITY OR FAMILY LEAVE FORMS, YOU MUST BRING THEM TO THE OFFICE FOR PROCESSING.  PLEASE DO NOT GIVE THEM TO YOUR DOCTOR.  A prescription for pain medication may be given to you upon discharge.  Take your pain medication as  prescribed, if needed.  If narcotic pain medicine is not needed, then you may take acetaminophen (Tylenol) or ibuprofen (Advil) as needed. Take your usually prescribed medications unless otherwise directed. If you need a refill on your pain medication, please contact your pharmacy. They will contact our office to request authorization.  Prescriptions will not be filled after 5pm or on week-ends. You should follow a light diet the first few days after arrival home, such as soup and crackers, pudding, etc.unless your doctor has advised otherwise. A high-fiber, low fat diet can be resumed as tolerated.   Be sure to include lots of fluids daily. Most patients will experience some swelling and bruising on the chest and neck area.  Ice packs will help.  Swelling and bruising can take several days to resolve Most patients will experience some swelling and bruising in the area of the incision. Ice pack will help. Swelling and bruising can take several days to resolve..  It is common to experience some constipation if taking pain medication after surgery.  Increasing fluid intake and taking a stool softener will usually help or prevent this problem from occurring.  A mild laxative (Milk of Magnesia or Miralax) should be taken according to package directions if there are no bowel movements after 48 hours.  You may have  steri-strips (small skin tapes) in place directly over the incision.  These strips should be left on the skin for 7-10 days.  If your surgeon used skin glue on the incision, you may shower in 24 hours.  The glue will flake off over the next 2-3 weeks.  Any sutures or staples will be removed at the office during your follow-up visit. You may find that a light gauze bandage over your incision may keep your staples from being rubbed or pulled. You may shower and replace the bandage daily. ACTIVITIES:  You may resume regular (light) daily activities beginning the next day--such as daily self-care, walking,  climbing stairs--gradually increasing activities as tolerated.  You may have sexual intercourse when it is comfortable.  Refrain from any heavy lifting or straining until approved by your doctor. You may drive when you no longer are taking prescription pain medication, you can comfortably wear a seatbelt, and you can safely maneuver your car and apply brakes Return to Work: ___________________________________ Dennis Bast should see your doctor in the office for a follow-up appointment approximately two weeks after your surgery.  Make sure that you call for this appointment within a day or two after you arrive home to insure a convenient appointment time. OTHER INSTRUCTIONS:  _____________________________________________________________ _____________________________________________________________  WHEN TO CALL YOUR DOCTOR: Fever over 101.0 Inability to urinate Nausea and/or vomiting Extreme swelling or bruising Continued bleeding from incision. Increased pain, redness, or drainage from the incision. Difficulty swallowing or breathing Muscle cramping or spasms. Numbness or tingling in hands or feet or around lips.  The clinic staff is available to answer your questions during regular business hours.  Please don't hesitate to call and ask to speak to one of the nurses if you have concerns.  For further questions, please visit [email or web address]

## 2022-04-05 DIAGNOSIS — G9340 Encephalopathy, unspecified: Secondary | ICD-10-CM | POA: Diagnosis not present

## 2022-04-05 DIAGNOSIS — A4151 Sepsis due to Escherichia coli [E. coli]: Secondary | ICD-10-CM | POA: Diagnosis not present

## 2022-04-05 DIAGNOSIS — K529 Noninfective gastroenteritis and colitis, unspecified: Secondary | ICD-10-CM | POA: Diagnosis not present

## 2022-04-05 DIAGNOSIS — R7881 Bacteremia: Secondary | ICD-10-CM | POA: Diagnosis not present

## 2022-04-05 LAB — URINE CULTURE: Culture: 20000 — AB

## 2022-04-05 LAB — CBC WITH DIFFERENTIAL/PLATELET
Abs Immature Granulocytes: 0.11 10*3/uL — ABNORMAL HIGH (ref 0.00–0.07)
Basophils Absolute: 0 10*3/uL (ref 0.0–0.1)
Basophils Relative: 0 %
Eosinophils Absolute: 0 10*3/uL (ref 0.0–0.5)
Eosinophils Relative: 0 %
HCT: 32.7 % — ABNORMAL LOW (ref 36.0–46.0)
Hemoglobin: 11.5 g/dL — ABNORMAL LOW (ref 12.0–15.0)
Immature Granulocytes: 1 %
Lymphocytes Relative: 11 %
Lymphs Abs: 0.9 10*3/uL (ref 0.7–4.0)
MCH: 34.2 pg — ABNORMAL HIGH (ref 26.0–34.0)
MCHC: 35.2 g/dL (ref 30.0–36.0)
MCV: 97.3 fL (ref 80.0–100.0)
Monocytes Absolute: 0.4 10*3/uL (ref 0.1–1.0)
Monocytes Relative: 5 %
Neutro Abs: 6.7 10*3/uL (ref 1.7–7.7)
Neutrophils Relative %: 83 %
Platelets: 56 10*3/uL — ABNORMAL LOW (ref 150–400)
RBC: 3.36 MIL/uL — ABNORMAL LOW (ref 3.87–5.11)
RDW: 23.3 % — ABNORMAL HIGH (ref 11.5–15.5)
WBC: 8.1 10*3/uL (ref 4.0–10.5)
nRBC: 0 % (ref 0.0–0.2)

## 2022-04-05 LAB — COMPREHENSIVE METABOLIC PANEL
ALT: 19 U/L (ref 0–44)
AST: 32 U/L (ref 15–41)
Albumin: 2.4 g/dL — ABNORMAL LOW (ref 3.5–5.0)
Alkaline Phosphatase: 91 U/L (ref 38–126)
Anion gap: 5 (ref 5–15)
BUN: 19 mg/dL (ref 6–20)
CO2: 23 mmol/L (ref 22–32)
Calcium: 8.4 mg/dL — ABNORMAL LOW (ref 8.9–10.3)
Chloride: 109 mmol/L (ref 98–111)
Creatinine, Ser: 0.76 mg/dL (ref 0.44–1.00)
GFR, Estimated: >60 mL/min (ref >60)
Glucose, Bld: 167 mg/dL — ABNORMAL HIGH (ref 70–99)
Potassium: 2.9 mmol/L — ABNORMAL LOW (ref 3.5–5.1)
Sodium: 137 mmol/L (ref 135–145)
Total Bilirubin: 1.6 mg/dL — ABNORMAL HIGH (ref 0.3–1.2)
Total Protein: 5.1 g/dL — ABNORMAL LOW (ref 6.5–8.1)

## 2022-04-05 LAB — BILIRUBIN, FRACTIONATED(TOT/DIR/INDIR)
Bilirubin, Direct: 0.8 mg/dL — ABNORMAL HIGH (ref 0.0–0.2)
Indirect Bilirubin: 0.8 mg/dL (ref 0.3–0.9)
Total Bilirubin: 1.6 mg/dL — ABNORMAL HIGH (ref 0.3–1.2)

## 2022-04-05 MED ORDER — LOPERAMIDE HCL 2 MG PO CAPS
4.0000 mg | ORAL_CAPSULE | Freq: Three times a day (TID) | ORAL | Status: DC | PRN
  Administered 2022-04-05 – 2022-04-07 (×7): 4 mg via ORAL
  Filled 2022-04-05 (×7): qty 2

## 2022-04-05 MED ORDER — SODIUM CHLORIDE 0.9 % IV SOLN: INTRAVENOUS | Status: DC

## 2022-04-05 MED ORDER — POTASSIUM CHLORIDE 20 MEQ PO PACK
40.0000 meq | PACK | Freq: Two times a day (BID) | ORAL | Status: DC
  Administered 2022-04-05 (×2): 40 meq via ORAL
  Filled 2022-04-05 (×2): qty 2

## 2022-04-05 MED ORDER — POTASSIUM CHLORIDE 10 MEQ/100ML IV SOLN
10.0000 meq | INTRAVENOUS | Status: AC
  Administered 2022-04-05 (×4): 10 meq via INTRAVENOUS
  Filled 2022-04-05 (×3): qty 100

## 2022-04-05 NOTE — Progress Notes (Signed)
   04/05/22 0820  Assess: MEWS Score  Temp 98.9 F (37.2 C)  BP 126/81  MAP (mmHg) 93  Pulse Rate (!) 112  Resp (!) 24  Level of Consciousness Alert  SpO2 95 %  O2 Device Room Air  Assess: MEWS Score  MEWS Temp 0  MEWS Systolic 0  MEWS Pulse 2  MEWS RR 1  MEWS LOC 0  MEWS Score 3  MEWS Score Color Yellow  Assess: if the MEWS score is Yellow or Red  Were vital signs taken at a resting state? Yes  Focused Assessment Change from prior assessment (see assessment flowsheet)  Does the patient meet 2 or more of the SIRS criteria? Yes  Does the patient have a confirmed or suspected source of infection? Yes  Provider and Rapid Response Notified?  (Provider notified)  MEWS guidelines implemented *See Row Information* Yes  Treat  MEWS Interventions Escalated (See documentation below)  Pain Scale 0-10  Pain Score 0  Take Vital Signs  Increase Vital Sign Frequency  Yellow: Q 2hr X 2 then Q 4hr X 2, if remains yellow, continue Q 4hrs  Escalate  MEWS: Escalate Yellow: discuss with charge nurse/RN and consider discussing with provider and RRT  Notify: Charge Nurse/RN  Name of Charge Nurse/RN Notified Russ Halo, RN  Date Charge Nurse/RN Notified 04/05/22  Time Charge Nurse/RN Notified [id number]  Notify: Provider  Provider Name/Title Bonnielee Haff, MD  Date Provider Notified 04/05/22  Time Provider Notified [phone]  Method of Notification Face-to-face  Notification Reason Change in status  Assess: SIRS CRITERIA  SIRS Temperature  0  SIRS Pulse 1  SIRS Respirations  1  SIRS WBC 1  SIRS Score Sum  3

## 2022-04-05 NOTE — Progress Notes (Signed)
[facility] PROGRESS NOTE   Angela Mcpherson GYI:[id number] DOB: [birth year] DOA: 04/03/2022  PCP: Default, Provider, MD  Brief History/Interval Summary: 61 y.o. female with medical history significant for aortic mural thrombus, rectal cancer.  Presented with abdominal pain and confusion.  She had chemotherapy 2 days prior to admission.  Was noted to be lethargic and has been having loose stools at home.  Evaluation in the ED raised concern for colitis.  Patient was hospitalized for further management.    Consultants: Medical oncologist has been notified, Dr. Burr Medico  Procedures: None    Subjective/Interval History: Patient denies any complaints this morning.  Specifically no chest pain shortness of breath nausea or vomiting.  She does feel a little better compared to when she was initially hospitalized.     Assessment/Plan:  Acute colitis with sepsis, present on admission/bacteremia Lactic acid level was noted to be significantly elevated.  Patient was aggressively hydrated.  Blood pressure has improved.  Cortisol level was noted to be normal.  Lactic acid levels improved.  Significantly elevated procalcitonin level was noted likely due to bacteremia. C. difficile PCR is negative.  GI pathogen panel is negative. Blood culture positive for E. coli. Initially placed on cefepime and metronidazole.  Changed over to ceftriaxone. Noted to be afebrile.  WBC is normal. We will discuss with ID once final culture data is available.  History of left-sided colon Cancer Followed by medical oncology.  On chemotherapy.    Acute metabolic encephalopathy No focal neurological deficits on examination.  Mentation seems to be gradually improving.  Encephalopathy most likely due to sepsis.  Urinary retention Foley catheter had to be placed.  Continue to monitor. Will do voiding trial once the patient has been mobilized. UA reviewed.  Urine culture with 20,000 colonies of Klebsiella  oxytoca.  Mildly elevated troponin levels Most likely demand ischemia.  Patient denies any chest pain.  No ischemic changes noted on EKG. echocardiogram is pending.  Hyperbilirubinemia Trending down.  Most likely secondary to sepsis.  CT abdomen did not show any abnormalities in the hepatobiliary system.  History of aortic mural thrombus Eliquis being continued.  This was originally diagnosed in October 2022.  She was found to have noncalcified thrombus in the infrarenal abdominal aorta causing severe stenosis at that time.  Subsequent CT scan done and January 2023 showed significant interval decrease in the thrombus. Since there has been a decrease in thrombus size and no mention of the thrombus on subsequent imaging studies anticoagulation can be briefly held if needed.  Hypokalemia Potassium level remains low.  Magnesium is 2.1.  Will aggressively replete the potassium.  Normocytic anemia/thrombocytopenia Etiology unclear.  No overt blood loss noted though her stool for occult blood was positive.  Check anemia panel. She has had thrombocytopenia for several months now.  Platelet count noted to be 39,000 yesterday morning.  Drop in platelet count could be due to sepsis.  No signs of bleeding.  Eliquis was held.  Platelet count improved to 56,000.  We will recheck tomorrow and if it continues to improve we will resume Eliquis tomorrow.  DVT Prophylaxis: Eliquis held for now.  SCDs. Code Status: Full code Family Communication: Discussed with patient.  No family at bedside Disposition Plan: Hopefully return home when improved.  Start mobilizing  Status is: Inpatient Remains inpatient appropriate because: Sepsis, bacteremia, colitis      Medications: Scheduled:  Chlorhexidine Gluconate Cloth  6 each Topical Daily   feeding supplement  1 Container Oral Q24H  feeding supplement  237 mL Oral BID BM   multivitamin with minerals  1 tablet Oral Daily   mouth rinse  15 mL Mouth Rinse 4  times per day   potassium chloride  40 mEq Oral BID   Continuous:  cefTRIAXone (ROCEPHIN)  IV 2 g (04/05/22 7989)   lactated ringers 100 mL/hr at 04/05/22 0051   potassium chloride 10 mEq (04/05/22 0940)   QJJ:HERDEYCXKGYJE **OR** acetaminophen, mouth rinse, oxyCODONE, prochlorperazine  Antibiotics: Anti-infectives (From admission, onward)    Start     Dose/Rate Route Frequency Ordered Stop   04/04/22 0900  cefTRIAXone (ROCEPHIN) 2 g in sodium chloride 0.9 % 100 mL IVPB        2 g 200 mL/hr over 30 Minutes Intravenous Every 24 hours 04/04/22 0257     04/03/22 1700  ceFEPIme (MAXIPIME) 2 g in sodium chloride 0.9 % 100 mL IVPB  Status:  Discontinued        2 g 200 mL/hr over 30 Minutes Intravenous Every 8 hours 04/03/22 1634 04/04/22 0257   04/03/22 1500  metroNIDAZOLE (FLAGYL) IVPB 500 mg  Status:  Discontinued        500 mg 100 mL/hr over 60 Minutes Intravenous Every 12 hours 04/03/22 1446 04/04/22 0257   04/03/22 1045  ceFEPIme (MAXIPIME) 2 g in sodium chloride 0.9 % 100 mL IVPB        2 g 200 mL/hr over 30 Minutes Intravenous  Once 04/03/22 1031 04/03/22 1429   04/03/22 1045  vancomycin (VANCOCIN) IVPB 1000 mg/200 mL premix        1,000 mg 200 mL/hr over 60 Minutes Intravenous  Once 04/03/22 1031 04/03/22 1241       Objective:  Vital Signs  Vitals:   04/05/22 0427 04/05/22 0820 04/05/22 0825 04/05/22 0921  BP: (!) 139/92 126/81  132/75  Pulse: (!) 106 (!) 112  (!) 107  Resp: 18 (!) 24  18  Temp: 98.7 F (37.1 C) 98.9 F (37.2 C) 99.6 F (37.6 C) 98.3 F (36.8 C)  TempSrc: Oral Axillary Oral Oral  SpO2: 99% 95%  96%  Weight:      Height:        Intake/Output Summary (Last 24 hours) at 04/05/2022 1006 Last data filed at 04/05/2022 0641 Gross per 24 hour  Intake 1195.4 ml  Output 1704 ml  Net -508.6 ml    Filed Weights   04/04/22 0847  Weight: 49.3 kg    General appearance: Awake alert.  In no distress Resp: Clear to auscultation bilaterally.  Normal  effort Cardio: S1-S2 is normal regular.  No S3-S4.  No rubs murmurs or bruit GI: Abdomen is soft.  Nontender nondistended.  Bowel sounds are present normal.  No masses organomegaly Extremities: No edema.  Full range of motion of lower extremities. Neurologic:   No focal neurological deficits.     Lab Results:  Data Reviewed: I have personally reviewed following labs and reports of the imaging studies  CBC: Recent Labs  Lab 04/03/22 1035 04/04/22 0336 04/05/22 0502  WBC 13.3* 6.9 8.1  NEUTROABS 11.5*  --  6.7  HGB 11.5* 9.5* 11.5*  HCT 33.8* 28.2* 32.7*  MCV 99.1 99.3 97.3  PLT 72* 39* 56*     Basic Metabolic Panel: Recent Labs  Lab 04/03/22 1126 04/04/22 0336 04/04/22 0744 04/05/22 0502  NA 137 140  --  137  K 3.5 3.4*  --  2.9*  CL 105 115*  --  109  CO2 24  21*  --  23  GLUCOSE 128* 130*  --  167*  BUN 38* 32*  --  19  CREATININE 0.72 0.67  --  0.76  CALCIUM 8.3* 7.6*  --  8.4*  MG  --   --  2.1  --      GFR: Estimated Creatinine Clearance: 58.2 mL/min (by C-G formula based on SCr of 0.76 mg/dL).  Liver Function Tests: Recent Labs  Lab 04/03/22 1126 04/04/22 0336 04/05/22 0502  AST 33 23 32  ALT '18 15 19  '$ ALKPHOS 83 67 91  BILITOT 2.7* 2.0* 1.6*  PROT 5.6* 4.3* 5.1*  ALBUMIN 2.6* 2.1* 2.4*     Recent Labs  Lab 04/03/22 1126  LIPASE 44    Recent Labs  Lab 04/03/22 1426  AMMONIA 34     Coagulation Profile: Recent Labs  Lab 04/03/22 1035 04/04/22 0336  INR 3.0* 4.0*      Recent Results (from the past 240 hour(s))  Culture, blood (routine x 2)     Status: None (Preliminary result)   Collection Time: 04/03/22 10:40 AM   Specimen: BLOOD  Result Value Ref Range Status   Specimen Description   Final    BLOOD RIGHT ANTECUBITAL Performed at [facility], [place] [address] [place], [place] [address]    Special Requests   Final    BOTTLES DRAWN AEROBIC AND ANAEROBIC Blood Culture results may not be optimal due to  an inadequate volume of blood received in culture bottles Performed at [facility] [address] [place], [place] [address]    Culture  Setup Time   Final    GRAM NEGATIVE RODS IN BOTH AEROBIC AND ANAEROBIC BOTTLES CRITICAL VALUE NOTED.  VALUE IS CONSISTENT WITH PREVIOUSLY REPORTED AND CALLED VALUE. Performed at [facility], [place] [address] [place], [place] [address]    Culture GRAM NEGATIVE RODS  Final   Report Status PENDING  Incomplete  Culture, blood (routine x 2)     Status: Abnormal (Preliminary result)   Collection Time: 04/03/22 10:40 AM   Specimen: BLOOD  Result Value Ref Range Status   Specimen Description   Final    BLOOD LEFT ANTECUBITAL Performed at [facility] [address], [place], [place] [address]    Special Requests   Final    BOTTLES DRAWN AEROBIC AND ANAEROBIC Blood Culture adequate volume Performed at [facility] [address], [place], [place] [address]    Culture  Setup Time   Final    GRAM NEGATIVE RODS IN BOTH AEROBIC AND ANAEROBIC BOTTLES Organism ID to follow CRITICAL RESULT CALLED TO, READ BACK BY AND VERIFIED WITH: M LILLISTON,PHARMD'@0138'$  04/04/22 [place]    Culture (A)  Final    ESCHERICHIA COLI SUSCEPTIBILITIES TO FOLLOW Performed at [facility], [place] [address], [place], [place] [address]    Report Status PENDING  Incomplete  Blood Culture ID Panel (Reflexed)     Status: Abnormal   Collection Time: 04/03/22 10:40 AM  Result Value Ref Range Status   Enterococcus faecalis NOT DETECTED NOT DETECTED Final   Enterococcus Faecium NOT DETECTED NOT DETECTED Final   Listeria monocytogenes NOT DETECTED NOT DETECTED Final   Staphylococcus species NOT DETECTED NOT DETECTED Final   Staphylococcus aureus (BCID) NOT DETECTED NOT DETECTED Final   Staphylococcus epidermidis NOT DETECTED NOT DETECTED Final   Staphylococcus lugdunensis NOT DETECTED NOT DETECTED Final   Streptococcus  species NOT DETECTED NOT DETECTED  Final   Streptococcus agalactiae NOT DETECTED NOT DETECTED Final   Streptococcus pneumoniae NOT DETECTED NOT DETECTED Final   Streptococcus pyogenes NOT DETECTED NOT DETECTED Final   A.calcoaceticus-baumannii NOT DETECTED NOT DETECTED Final   Bacteroides fragilis NOT DETECTED NOT DETECTED Final   Enterobacterales DETECTED (A) NOT DETECTED Final    Comment: Enterobacterales represent a large order of gram negative bacteria, not a single organism. CRITICAL RESULT CALLED TO, READ BACK BY AND VERIFIED WITH: M LILLISTON,PHARMD'@0138'$  04/04/22 [place]    Enterobacter cloacae complex NOT DETECTED NOT DETECTED Final   Escherichia coli DETECTED (A) NOT DETECTED Final    Comment: CRITICAL RESULT CALLED TO, READ BACK BY AND VERIFIED WITH: M LILLISTON,PHARMD'@0138'$  04/04/22 [place]    Klebsiella aerogenes NOT DETECTED NOT DETECTED Final   Klebsiella oxytoca NOT DETECTED NOT DETECTED Final   Klebsiella pneumoniae NOT DETECTED NOT DETECTED Final   Proteus species NOT DETECTED NOT DETECTED Final   Salmonella species NOT DETECTED NOT DETECTED Final   Serratia marcescens NOT DETECTED NOT DETECTED Final   Haemophilus influenzae NOT DETECTED NOT DETECTED Final   Neisseria meningitidis NOT DETECTED NOT DETECTED Final   Pseudomonas aeruginosa NOT DETECTED NOT DETECTED Final   Stenotrophomonas maltophilia NOT DETECTED NOT DETECTED Final   Candida albicans NOT DETECTED NOT DETECTED Final   Candida auris NOT DETECTED NOT DETECTED Final   Candida glabrata NOT DETECTED NOT DETECTED Final   Candida krusei NOT DETECTED NOT DETECTED Final   Candida parapsilosis NOT DETECTED NOT DETECTED Final   Candida tropicalis NOT DETECTED NOT DETECTED Final   Cryptococcus neoformans/gattii NOT DETECTED NOT DETECTED Final   CTX-M ESBL NOT DETECTED NOT DETECTED Final   Carbapenem resistance IMP NOT DETECTED NOT DETECTED Final   Carbapenem resistance KPC NOT DETECTED NOT DETECTED Final   Carbapenem  resistance NDM NOT DETECTED NOT DETECTED Final   Carbapenem resist OXA 48 LIKE NOT DETECTED NOT DETECTED Final   Carbapenem resistance VIM NOT DETECTED NOT DETECTED Final    Comment: Performed at [facility], [address], [place], [place] [address]  Resp Panel by RT-PCR (Flu A&B, Covid) Anterior Nasal Swab     Status: None   Collection Time: 04/03/22 10:47 AM   Specimen: Anterior Nasal Swab  Result Value Ref Range Status   SARS Coronavirus 2 by RT PCR NEGATIVE NEGATIVE Final    Comment: (NOTE) SARS-CoV-2 target nucleic acids are NOT DETECTED.  The SARS-CoV-2 RNA is generally detectable in upper respiratory specimens during the acute phase of infection. The lowest concentration of SARS-CoV-2 viral copies this assay can detect is 138 copies/mL. A negative result does not preclude SARS-Cov-2 infection and should not be used as the sole basis for treatment or other patient management decisions. A negative result may occur with  improper specimen collection/handling, submission of specimen other than nasopharyngeal swab, presence of viral mutation(s) within the areas targeted by this assay, and inadequate number of viral copies(<138 copies/mL). A negative result must be combined with clinical observations, patient history, and epidemiological information. The expected result is Negative.  Fact Sheet for Patients:  EntrepreneurPulse.com.au  Fact Sheet for Healthcare Providers:  IncredibleEmployment.be  This test is no t yet approved or cleared by the [place] FDA and  has been authorized for detection and/or diagnosis of SARS-CoV-2 by FDA under an Emergency Use Authorization (EUA). This EUA will remain  in effect (meaning this test can be used) for the duration of the COVID-19 declaration under Section 564(b)(1) of the Act, 21 U.S.C.section  360bbb-3(b)(1), unless the authorization is terminated  or revoked sooner.       Influenza A  by PCR NEGATIVE NEGATIVE Final   Influenza B by PCR NEGATIVE NEGATIVE Final    Comment: (NOTE) The Xpert Xpress SARS-CoV-2/FLU/RSV plus assay is intended as an aid in the diagnosis of influenza from Nasopharyngeal swab specimens and should not be used as a sole basis for treatment. Nasal washings and aspirates are unacceptable for Xpert Xpress SARS-CoV-2/FLU/RSV testing.  Fact Sheet for Patients: EntrepreneurPulse.com.au  Fact Sheet for Healthcare Providers: IncredibleEmployment.be  This test is not yet approved or cleared by the [place] FDA and has been authorized for detection and/or diagnosis of SARS-CoV-2 by FDA under an Emergency Use Authorization (EUA). This EUA will remain in effect (meaning this test can be used) for the duration of the COVID-19 declaration under Section 564(b)(1) of the Act, 21 U.S.C. section 360bbb-3(b)(1), unless the authorization is terminated or revoked.  Performed at [facility], [place] [address], [place], [place] [address]   C Difficile Quick Screen w PCR reflex     Status: None   Collection Time: 04/03/22  2:53 PM   Specimen: Nasal Mucosa; Stool  Result Value Ref Range Status   C Diff antigen NEGATIVE NEGATIVE Final   C Diff toxin NEGATIVE NEGATIVE Final   C Diff interpretation No C. difficile detected.  Final    Comment: Performed at [facility], [place] [address], [place], [place] [address]  Gastrointestinal Panel by PCR , Stool     Status: None   Collection Time: 04/03/22  2:54 PM   Specimen: Nasal Mucosa; Stool  Result Value Ref Range Status   Campylobacter species NOT DETECTED NOT DETECTED Final   Plesimonas shigelloides NOT DETECTED NOT DETECTED Final   Salmonella species NOT DETECTED NOT DETECTED Final   Yersinia enterocolitica NOT DETECTED NOT DETECTED Final   Vibrio species NOT DETECTED NOT DETECTED Final   Vibrio cholerae NOT DETECTED NOT DETECTED Final    Enteroaggregative E coli (EAEC) NOT DETECTED NOT DETECTED Final   Enteropathogenic E coli (EPEC) NOT DETECTED NOT DETECTED Final   Enterotoxigenic E coli (ETEC) NOT DETECTED NOT DETECTED Final   Shiga like toxin producing E coli (STEC) NOT DETECTED NOT DETECTED Final   Shigella/Enteroinvasive E coli (EIEC) NOT DETECTED NOT DETECTED Final   Cryptosporidium NOT DETECTED NOT DETECTED Final   Cyclospora cayetanensis NOT DETECTED NOT DETECTED Final   Entamoeba histolytica NOT DETECTED NOT DETECTED Final   Giardia lamblia NOT DETECTED NOT DETECTED Final   Adenovirus F40/41 NOT DETECTED NOT DETECTED Final   Astrovirus NOT DETECTED NOT DETECTED Final   Norovirus GI/GII NOT DETECTED NOT DETECTED Final   Rotavirus A NOT DETECTED NOT DETECTED Final   Sapovirus (I, II, IV, and V) NOT DETECTED NOT DETECTED Final    Comment: Performed at [facility], [address] [place], [place] [address]  Urine Culture     Status: Abnormal   Collection Time: 04/03/22  3:19 PM   Specimen: Urine, Catheterized  Result Value Ref Range Status   Specimen Description   Final    URINE, CATHETERIZED Performed at [facility], [place] [address], [place], [place] [address]    Special Requests   Final    NONE Performed at [facility], [place] [address] [place], [place] [address]    Culture 20,000 COLONIES/mL KLEBSIELLA OXYTOCA (A)  Final   Report Status 04/05/2022 FINAL  Final   Organism ID,  Bacteria KLEBSIELLA OXYTOCA (A)  Final      Susceptibility   Klebsiella oxytoca - MIC*    AMPICILLIN >=32 RESISTANT Resistant     CEFAZOLIN <=4 SENSITIVE Sensitive     CEFEPIME <=0.12 SENSITIVE Sensitive     CEFTRIAXONE <=0.25 SENSITIVE Sensitive     CIPROFLOXACIN <=0.25 SENSITIVE Sensitive     GENTAMICIN <=1 SENSITIVE Sensitive     IMIPENEM <=0.25 SENSITIVE Sensitive     NITROFURANTOIN <=16 SENSITIVE Sensitive     TRIMETH/SULFA <=20 SENSITIVE Sensitive     AMPICILLIN/SULBACTAM 8  SENSITIVE Sensitive     PIP/TAZO <=4 SENSITIVE Sensitive     * 20,000 COLONIES/mL KLEBSIELLA OXYTOCA  MRSA Next Gen by PCR, Nasal     Status: None   Collection Time: 04/03/22  8:36 PM   Specimen: Nasal Mucosa; Nasal Swab  Result Value Ref Range Status   MRSA by PCR Next Gen NOT DETECTED NOT DETECTED Final    Comment: (NOTE) The GeneXpert MRSA Assay (FDA approved for NASAL specimens only), is one component of a comprehensive MRSA colonization surveillance program. It is not intended to diagnose MRSA infection nor to guide or monitor treatment for MRSA infections. Test performance is not FDA approved in patients less than 40 years old. Performed at [facility], [place] [address], [place], [place] [address]       Radiology Studies: CT Angio Chest PE W and/or Wo Contrast  Result Date: 04/03/2022 CLINICAL DATA:  Hypoxia. EXAM: CT ANGIOGRAPHY CHEST WITH CONTRAST TECHNIQUE: Multidetector CT imaging of the chest was performed using the standard protocol during bolus administration of intravenous contrast. Multiplanar CT image reconstructions and MIPs were obtained to evaluate the vascular anatomy. RADIATION DOSE REDUCTION: This exam was performed according to the departmental dose-optimization program which includes automated exposure control, adjustment of the mA and/or kV according to patient size and/or use of iterative reconstruction technique. CONTRAST:  173m OMNIPAQUE IOHEXOL 300 MG/ML  SOLN COMPARISON:  March 18, 2022. FINDINGS: Cardiovascular: Satisfactory opacification of the pulmonary arteries to the segmental level. No evidence of pulmonary embolism. Normal heart size. No pericardial effusion. Mediastinum/Nodes: No enlarged mediastinal, hilar, or axillary lymph nodes. Thyroid gland, trachea, and esophagus demonstrate no significant findings. Lungs/Pleura: Lungs are clear. No pleural effusion or pneumothorax. Upper Abdomen: No acute abnormality. Musculoskeletal: No chest  wall abnormality. No acute or significant osseous findings. Review of the MIP images confirms the above findings. IMPRESSION: No definite evidence of pulmonary embolus. No definite abnormality seen in the chest. Electronically Signed   By: JMarijo ConceptionM.D.   On: 04/03/2022 14:41   CT ABDOMEN PELVIS W CONTRAST  Result Date: 04/03/2022 CLINICAL DATA:  Weakness, hypoxia, metastatic colon cancer * Tracking Code: BO * EXAM: CT ABDOMEN AND PELVIS WITH CONTRAST TECHNIQUE: Multidetector CT imaging of the abdomen and pelvis was performed using the standard protocol following bolus administration of intravenous contrast. RADIATION DOSE REDUCTION: This exam was performed according to the departmental dose-optimization program which includes automated exposure control, adjustment of the mA and/or kV according to patient size and/or use of iterative reconstruction technique. CONTRAST:  1044mOMNIPAQUE IOHEXOL 300 MG/ML  SOLN COMPARISON:  CT chest abdomen pelvis, 03/18/2022 FINDINGS: Lower chest: No acute abnormality. Please see separately reported examination of the chest. Hepatobiliary: No solid liver abnormality is seen. No gallstones, gallbladder wall thickening, or biliary dilatation. Pancreas: Unremarkable. No pancreatic ductal dilatation or surrounding inflammatory changes. Spleen: Splenomegaly, maximum coronal span 14.7 cm. Adrenals/Urinary Tract: Adrenal glands are unremarkable. Kidneys are  normal, without renal calculi, solid lesion, or hydronephrosis. Mildly distended urinary bladder. Stomach/Bowel: Stomach is within normal limits. Appendix appears normal. New, diffuse, long segment wall thickening of the colon, involving the cecum through the splenic flexure (series 7, image 29, 17, series 4, image 55). Unchanged circumferential mass of the rectosigmoid junction (series 4, image 73). Vascular/Lymphatic: Aortic atherosclerosis. Unchanged left mesorectal or pelvic sidewall lymph node measuring 3.6 x 2.3 cm  (series 4, image 79). Unchanged prominent subcentimeter retroperitoneal lymph nodes (series 4, image 46). Reproductive: No mass or other significant abnormality. Other: No abdominal wall hernia or abnormality. No ascites. Musculoskeletal: No acute or significant osseous findings. IMPRESSION: 1. New, diffuse, long segment wall thickening of the colon, involving the cecum through the splenic flexure. Findings are consistent with nonspecific infectious, inflammatory, or ischemic colitis. 2. Unchanged circumferential mass of the rectosigmoid junction. Unchanged enlarged left mesorectal or pelvic sidewall lymph node. 3. Distended urinary bladder.  Correlate for urinary retention. 4. Splenomegaly. Aortic Atherosclerosis (ICD10-I70.0). Electronically Signed   By: Delanna Ahmadi M.D.   On: 04/03/2022 14:40   DG Chest Port 1 View  Result Date: 04/03/2022 CLINICAL DATA:  Weakness EXAM: PORTABLE CHEST 1 VIEW COMPARISON:  11/18/2021 FINDINGS: The heart size and mediastinal contours are within normal limits. Both lungs are clear. The visualized skeletal structures are unremarkable. IMPRESSION: No acute abnormality lungs in AP portable projection. Electronically Signed   By: Delanna Ahmadi M.D.   On: 04/03/2022 10:50       LOS: 2 days   [facility] Pager on [email or web address]  04/05/2022, 10:06 AM

## 2022-04-06 ENCOUNTER — Other Ambulatory Visit: Payer: Self-pay

## 2022-04-06 ENCOUNTER — Inpatient Hospital Stay (HOSPITAL_COMMUNITY): Payer: 59

## 2022-04-06 DIAGNOSIS — A4151 Sepsis due to Escherichia coli [E. coli]: Secondary | ICD-10-CM | POA: Diagnosis not present

## 2022-04-06 DIAGNOSIS — R7881 Bacteremia: Secondary | ICD-10-CM | POA: Diagnosis not present

## 2022-04-06 DIAGNOSIS — R778 Other specified abnormalities of plasma proteins: Secondary | ICD-10-CM | POA: Diagnosis not present

## 2022-04-06 DIAGNOSIS — K529 Noninfective gastroenteritis and colitis, unspecified: Secondary | ICD-10-CM | POA: Diagnosis not present

## 2022-04-06 DIAGNOSIS — G9340 Encephalopathy, unspecified: Secondary | ICD-10-CM | POA: Diagnosis not present

## 2022-04-06 DIAGNOSIS — I248 Other forms of acute ischemic heart disease: Secondary | ICD-10-CM

## 2022-04-06 LAB — CBC WITH DIFFERENTIAL/PLATELET
Abs Immature Granulocytes: 0.15 10*3/uL — ABNORMAL HIGH (ref 0.00–0.07)
Basophils Absolute: 0 10*3/uL (ref 0.0–0.1)
Basophils Relative: 0 %
Eosinophils Absolute: 0 10*3/uL (ref 0.0–0.5)
Eosinophils Relative: 0 %
HCT: 28.9 % — ABNORMAL LOW (ref 36.0–46.0)
Hemoglobin: 10 g/dL — ABNORMAL LOW (ref 12.0–15.0)
Immature Granulocytes: 1 %
Lymphocytes Relative: 11 %
Lymphs Abs: 1.3 10*3/uL (ref 0.7–4.0)
MCH: 34 pg (ref 26.0–34.0)
MCHC: 34.6 g/dL (ref 30.0–36.0)
MCV: 98.3 fL (ref 80.0–100.0)
Monocytes Absolute: 1.2 10*3/uL — ABNORMAL HIGH (ref 0.1–1.0)
Monocytes Relative: 10 %
Neutro Abs: 9.3 10*3/uL — ABNORMAL HIGH (ref 1.7–7.7)
Neutrophils Relative %: 78 %
Platelets: 46 10*3/uL — ABNORMAL LOW (ref 150–400)
RBC: 2.94 MIL/uL — ABNORMAL LOW (ref 3.87–5.11)
RDW: 23.3 % — ABNORMAL HIGH (ref 11.5–15.5)
WBC: 12 10*3/uL — ABNORMAL HIGH (ref 4.0–10.5)
nRBC: 0 % (ref 0.0–0.2)

## 2022-04-06 LAB — COMPREHENSIVE METABOLIC PANEL
ALT: 22 U/L (ref 0–44)
AST: 32 U/L (ref 15–41)
Albumin: 2.1 g/dL — ABNORMAL LOW (ref 3.5–5.0)
Alkaline Phosphatase: 72 U/L (ref 38–126)
Anion gap: 3 — ABNORMAL LOW (ref 5–15)
BUN: 17 mg/dL (ref 6–20)
CO2: 22 mmol/L (ref 22–32)
Calcium: 7.9 mg/dL — ABNORMAL LOW (ref 8.9–10.3)
Chloride: 111 mmol/L (ref 98–111)
Creatinine, Ser: 0.7 mg/dL (ref 0.44–1.00)
GFR, Estimated: >60 mL/min (ref >60)
Glucose, Bld: 116 mg/dL — ABNORMAL HIGH (ref 70–99)
Potassium: 3.8 mmol/L (ref 3.5–5.1)
Sodium: 136 mmol/L (ref 135–145)
Total Bilirubin: 1.8 mg/dL — ABNORMAL HIGH (ref 0.3–1.2)
Total Protein: 4.5 g/dL — ABNORMAL LOW (ref 6.5–8.1)

## 2022-04-06 LAB — ECHOCARDIOGRAM COMPLETE
AR max vel: 2.01 cm2
AV Area VTI: 1.91 cm2
AV Area mean vel: 1.91 cm2
AV Mean grad: 10.3 mmHg
AV Peak grad: 18.6 mmHg
Ao pk vel: 2.16 m/s
Area-P 1/2: 4.71 cm2
Height: 64 in
S' Lateral: 3.1 cm
Weight: 1738.99 oz

## 2022-04-06 LAB — CULTURE, BLOOD (ROUTINE X 2): Special Requests: ADEQUATE

## 2022-04-06 LAB — MAGNESIUM: Magnesium: 1.6 mg/dL — ABNORMAL LOW (ref 1.7–2.4)

## 2022-04-06 MED ORDER — MAGNESIUM SULFATE 4 GM/100ML IV SOLN
4.0000 g | Freq: Once | INTRAVENOUS | Status: AC
  Administered 2022-04-06: 4 g via INTRAVENOUS
  Filled 2022-04-06: qty 100

## 2022-04-06 MED ORDER — SODIUM CHLORIDE 0.9 % IV SOLN
INTRAVENOUS | Status: AC
Start: 2022-04-06 — End: 2022-04-07

## 2022-04-06 MED ORDER — POTASSIUM CHLORIDE 20 MEQ PO PACK
40.0000 meq | PACK | Freq: Once | ORAL | Status: AC
  Administered 2022-04-06: 40 meq via ORAL
  Filled 2022-04-06: qty 2

## 2022-04-06 NOTE — Progress Notes (Signed)
   04/06/22 1739  Assess: MEWS Score  Temp 98.5 F (36.9 C)  BP 111/79  MAP (mmHg) 89  Pulse Rate (!) 114  Resp 20  Level of Consciousness Alert  SpO2 96 %  O2 Device Room Air  Assess: MEWS Score  MEWS Temp 0  MEWS Systolic 0  MEWS Pulse 2  MEWS RR 0  MEWS LOC 0  MEWS Score 2  MEWS Score Color Yellow  Assess: if the MEWS score is Yellow or Red  Were vital signs taken at a resting state? Yes  Focused Assessment No change from prior assessment  Does the patient meet 2 or more of the SIRS criteria? Yes  Does the patient have a confirmed or suspected source of infection? Yes  Provider and Rapid Response Notified? No  MEWS guidelines implemented *See Row Information* No, previously yellow, continue vital signs every 4 hours  Assess: SIRS CRITERIA  SIRS Temperature  0  SIRS Pulse 1  SIRS Respirations  0  SIRS WBC 1  SIRS Score Sum  2

## 2022-04-06 NOTE — Progress Notes (Signed)
[facility] PROGRESS NOTE   Angela Mcpherson UKG:[id number] DOB: Dec 17, 1960 DOA: 04/03/2022  PCP: Default, Provider, MD  Brief History/Interval Summary: 61 y.o. female with medical history significant for aortic mural thrombus, rectal cancer.  Presented with abdominal pain and confusion.  She had chemotherapy 2 days prior to admission.  Was noted to be lethargic and has been having loose stools at home.  Evaluation in the ED raised concern for colitis.  Patient was hospitalized for further management.    Consultants: Medical oncologist, Dr. Burr Medico  Procedures: None    Subjective/Interval History: Patient mentions that her diarrhea has slowed down.  Denies any chest pain shortness of breath.  No nausea or vomiting.  Feeling better.     Assessment/Plan:  Acute colitis with sepsis, present on admission/E. coli bacteremia Lactic acid level was noted to be significantly elevated.  Patient was aggressively hydrated.  Blood pressure has improved.  Cortisol level was noted to be normal.  Lactic acid levels improved.  Significantly elevated procalcitonin level was noted likely due to bacteremia. C. difficile PCR is negative.  GI pathogen panel is negative. Blood culture positive for E. coli.  Noted to be sensitive to ceftriaxone. Initially placed on cefepime and metronidazole.  Changed over to ceftriaxone. Noted to have low-grade fever overnight with increase in WBC noted at this point.  Significance unclear.  Continue to monitor.  Recheck labs tomorrow.   Will need to repeat blood cultures if she continues to have fever. We will discuss with ID depending on her labs tomorrow.  History of left-sided colon Cancer Followed by medical oncology.  On chemotherapy.    Acute metabolic encephalopathy No focal neurological deficits on examination.  Mentation seems to be gradually improving.  Encephalopathy was most likely due to sepsis.  Urinary retention Foley catheter had to be placed.   Continue to monitor. Will do voiding trial once the patient has been mobilized. UA reviewed.  Urine culture with 20,000 colonies of Klebsiella oxytoca.  Mildly elevated troponin levels Most likely demand ischemia.  Patient denies any chest pain.  No ischemic changes noted on EKG. Echocardiogram is pending.  Hyperbilirubinemia Trending down.  Most likely secondary to sepsis.  CT abdomen did not show any abnormalities in the hepatobiliary system.  History of aortic mural thrombus Eliquis being continued.  This was originally diagnosed in October 2022.  She was found to have noncalcified thrombus in the infrarenal abdominal aorta causing severe stenosis at that time.  Subsequent CT scan done and January 2023 showed significant interval decrease in the thrombus. Since there has been a decrease in thrombus size and no mention of the thrombus on subsequent imaging studies anticoagulation can be briefly held if needed.  Hypokalemia/hypomagnesemia Potassium finally improved now that her diarrhea appears to have subsided.  Will give additional dose of potassium today.  Magnesium noted to be low and will be supplemented.    Normocytic anemia/thrombocytopenia Etiology unclear.  No overt blood loss noted though her stool for occult blood was positive.  Check anemia panel. She has had thrombocytopenia for several months now.  Did drop to 39,000 on 8/18.  Seems to be fluctuating.  Went up to 56,000 yesterday and noted to be 46,000 today. Continue to hold Eliquis for another day.  DVT Prophylaxis: Eliquis held for now.  SCDs. Code Status: Full code Family Communication: Discussed with patient.  No family at bedside Disposition Plan: Hopefully return home when improved.  Start mobilizing  Status is: Inpatient Remains inpatient appropriate because: Sepsis,  bacteremia, colitis      Medications: Scheduled:  Chlorhexidine Gluconate Cloth  6 each Topical Daily   feeding supplement  1 Container Oral  Q24H   feeding supplement  237 mL Oral BID BM   multivitamin with minerals  1 tablet Oral Daily   mouth rinse  15 mL Mouth Rinse 4 times per day   potassium chloride  40 mEq Oral BID   Continuous:  sodium chloride     cefTRIAXone (ROCEPHIN)  IV 2 g (04/06/22 0835)   FYB:[id number] **OR** acetaminophen, loperamide, mouth rinse, oxyCODONE, prochlorperazine  Antibiotics: Anti-infectives (From admission, onward)    Start     Dose/Rate Route Frequency Ordered Stop   04/04/22 0900  cefTRIAXone (ROCEPHIN) 2 g in sodium chloride 0.9 % 100 mL IVPB        2 g 200 mL/hr over 30 Minutes Intravenous Every 24 hours 04/04/22 0257     04/03/22 1700  ceFEPIme (MAXIPIME) 2 g in sodium chloride 0.9 % 100 mL IVPB  Status:  Discontinued        2 g 200 mL/hr over 30 Minutes Intravenous Every 8 hours 04/03/22 1634 04/04/22 0257   04/03/22 1500  metroNIDAZOLE (FLAGYL) IVPB 500 mg  Status:  Discontinued        500 mg 100 mL/hr over 60 Minutes Intravenous Every 12 hours 04/03/22 1446 04/04/22 0257   04/03/22 1045  ceFEPIme (MAXIPIME) 2 g in sodium chloride 0.9 % 100 mL IVPB        2 g 200 mL/hr over 30 Minutes Intravenous  Once 04/03/22 1031 04/03/22 1429   04/03/22 1045  vancomycin (VANCOCIN) IVPB 1000 mg/200 mL premix        1,000 mg 200 mL/hr over 60 Minutes Intravenous  Once 04/03/22 1031 04/03/22 1241       Objective:  Vital Signs  Vitals:   04/05/22 1153 04/05/22 1509 04/05/22 2016 04/06/22 0600  BP: 124/84 120/83 126/86 129/85  Pulse: 95 96 (!) 106 95  Resp: 18 (!) 28 18   Temp: 98.4 F (36.9 C) 99.5 F (37.5 C) (!) 100.4 F (38 C) 99.9 F (37.7 C)  TempSrc: Oral Oral Oral Oral  SpO2: 98% 96% 98% 97%  Weight:      Height:        Intake/Output Summary (Last 24 hours) at 04/06/2022 1035 Last data filed at 04/06/2022 0836 Gross per 24 hour  Intake 1640.07 ml  Output 1100 ml  Net 540.07 ml    Filed Weights   04/04/22 0847  Weight: 49.3 kg    General appearance: Awake  alert.  In no distress Resp: Clear to auscultation bilaterally.  Normal effort Cardio: S1-S2 is normal regular.  No S3-S4.  No rubs murmurs or bruit GI: Abdomen is soft.  Nontender nondistended.  Bowel sounds are present normal.  No masses organomegaly Extremities: No edema.  Full range of motion of lower extremities. Neurologic: Alert and oriented x3.  No focal neurological deficits.      Lab Results:  Data Reviewed: I have personally reviewed following labs and reports of the imaging studies  CBC: Recent Labs  Lab 04/03/22 1035 04/04/22 0336 04/05/22 0502 04/06/22 0352  WBC 13.3* 6.9 8.1 12.0*  NEUTROABS 11.5*  --  6.7 9.3*  HGB 11.5* 9.5* 11.5* 10.0*  HCT 33.8* 28.2* 32.7* 28.9*  MCV 99.1 99.3 97.3 98.3  PLT 72* 39* 56* 46*     Basic Metabolic Panel: Recent Labs  Lab 04/03/22 1126 04/04/22 0336 04/04/22 0744  04/05/22 0502 04/06/22 0352  NA 137 140  --  137 136  K 3.5 3.4*  --  2.9* 3.8  CL 105 115*  --  109 111  CO2 24 21*  --  23 22  GLUCOSE 128* 130*  --  167* 116*  BUN 38* 32*  --  19 17  CREATININE 0.72 0.67  --  0.76 0.70  CALCIUM 8.3* 7.6*  --  8.4* 7.9*  MG  --   --  2.1  --  1.6*     GFR: Estimated Creatinine Clearance: 58.2 mL/min (by C-G formula based on SCr of 0.7 mg/dL).  Liver Function Tests: Recent Labs  Lab 04/03/22 1126 04/04/22 0336 04/05/22 0502 04/05/22 1016 04/06/22 0352  AST 33 23 32  --  32  ALT '18 15 19  '$ --  22  ALKPHOS 83 67 91  --  72  BILITOT 2.7* 2.0* 1.6* 1.6* 1.8*  PROT 5.6* 4.3* 5.1*  --  4.5*  ALBUMIN 2.6* 2.1* 2.4*  --  2.1*     Recent Labs  Lab 04/03/22 1126  LIPASE 44    Recent Labs  Lab 04/03/22 1426  AMMONIA 34     Coagulation Profile: Recent Labs  Lab 04/03/22 1035 04/04/22 0336  INR 3.0* 4.0*      Recent Results (from the past 240 hour(s))  Culture, blood (routine x 2)     Status: None (Preliminary result)   Collection Time: 04/03/22 10:40 AM   Specimen: BLOOD  Result Value Ref  Range Status   Specimen Description   Final    BLOOD RIGHT ANTECUBITAL Performed at [facility], [place] [address] [place], [place] [address]    Special Requests   Final    BOTTLES DRAWN AEROBIC AND ANAEROBIC Blood Culture results may not be optimal due to an inadequate volume of blood received in culture bottles Performed at [place] [address], [place], [place] [address]    Culture  Setup Time   Final    GRAM NEGATIVE RODS IN BOTH AEROBIC AND ANAEROBIC BOTTLES CRITICAL VALUE NOTED.  VALUE IS CONSISTENT WITH PREVIOUSLY REPORTED AND CALLED VALUE. Performed at [facility], [place] [address], [place], [place] [address]    Culture GRAM NEGATIVE RODS  Final   Report Status PENDING  Incomplete  Culture, blood (routine x 2)     Status: Abnormal   Collection Time: 04/03/22 10:40 AM   Specimen: BLOOD  Result Value Ref Range Status   Specimen Description   Final    BLOOD LEFT ANTECUBITAL Performed at [place] [address] [place], [place] [address]    Special Requests   Final    BOTTLES DRAWN AEROBIC AND ANAEROBIC Blood Culture adequate volume Performed at [place] [address], [place], [place] [address]    Culture  Setup Time   Final    GRAM NEGATIVE RODS IN BOTH AEROBIC AND ANAEROBIC BOTTLES CRITICAL RESULT CALLED TO, READ BACK BY AND VERIFIED WITH: M LILLISTON,PHARMD'@0138'$  04/04/22 [place] Performed at [facility], [place] [address] [place], [place] [address]    Culture ESCHERICHIA COLI (A)  Final   Report Status 04/06/2022 FINAL  Final   Organism ID, Bacteria ESCHERICHIA COLI  Final      Susceptibility   Escherichia coli - MIC*    AMPICILLIN RESISTANT Resistant     CEFAZOLIN 32 INTERMEDIATE Intermediate     CEFEPIME <=0.12 SENSITIVE Sensitive  CEFTAZIDIME <=1 SENSITIVE Sensitive     CEFTRIAXONE <=0.25 SENSITIVE Sensitive     CIPROFLOXACIN <=0.25 SENSITIVE Sensitive      GENTAMICIN <=1 SENSITIVE Sensitive     IMIPENEM <=0.25 SENSITIVE Sensitive     TRIMETH/SULFA <=20 SENSITIVE Sensitive     AMPICILLIN/SULBACTAM <=2 SENSITIVE Sensitive     PIP/TAZO <=4 SENSITIVE Sensitive     * ESCHERICHIA COLI  Blood Culture ID Panel (Reflexed)     Status: Abnormal   Collection Time: 04/03/22 10:40 AM  Result Value Ref Range Status   Enterococcus faecalis NOT DETECTED NOT DETECTED Final   Enterococcus Faecium NOT DETECTED NOT DETECTED Final   Listeria monocytogenes NOT DETECTED NOT DETECTED Final   Staphylococcus species NOT DETECTED NOT DETECTED Final   Staphylococcus aureus (BCID) NOT DETECTED NOT DETECTED Final   Staphylococcus epidermidis NOT DETECTED NOT DETECTED Final   Staphylococcus lugdunensis NOT DETECTED NOT DETECTED Final   Streptococcus species NOT DETECTED NOT DETECTED Final   Streptococcus agalactiae NOT DETECTED NOT DETECTED Final   Streptococcus pneumoniae NOT DETECTED NOT DETECTED Final   Streptococcus pyogenes NOT DETECTED NOT DETECTED Final   A.calcoaceticus-baumannii NOT DETECTED NOT DETECTED Final   Bacteroides fragilis NOT DETECTED NOT DETECTED Final   Enterobacterales DETECTED (A) NOT DETECTED Final    Comment: Enterobacterales represent a large order of gram negative bacteria, not a single organism. CRITICAL RESULT CALLED TO, READ BACK BY AND VERIFIED WITH: M LILLISTON,PHARMD'@0138'$  04/04/22 [place]    Enterobacter cloacae complex NOT DETECTED NOT DETECTED Final   Escherichia coli DETECTED (A) NOT DETECTED Final    Comment: CRITICAL RESULT CALLED TO, READ BACK BY AND VERIFIED WITH: M LILLISTON,PHARMD'@0138'$  04/04/22 [address]    Klebsiella aerogenes NOT DETECTED NOT DETECTED Final   Klebsiella oxytoca NOT DETECTED NOT DETECTED Final   Klebsiella pneumoniae NOT DETECTED NOT DETECTED Final   Proteus species NOT DETECTED NOT DETECTED Final   Salmonella species NOT DETECTED NOT DETECTED Final   Serratia marcescens NOT DETECTED NOT DETECTED Final    Haemophilus influenzae NOT DETECTED NOT DETECTED Final   Neisseria meningitidis NOT DETECTED NOT DETECTED Final   Pseudomonas aeruginosa NOT DETECTED NOT DETECTED Final   Stenotrophomonas maltophilia NOT DETECTED NOT DETECTED Final   Candida albicans NOT DETECTED NOT DETECTED Final   Candida auris NOT DETECTED NOT DETECTED Final   Candida glabrata NOT DETECTED NOT DETECTED Final   Candida krusei NOT DETECTED NOT DETECTED Final   Candida parapsilosis NOT DETECTED NOT DETECTED Final   Candida tropicalis NOT DETECTED NOT DETECTED Final   Cryptococcus neoformans/gattii NOT DETECTED NOT DETECTED Final   CTX-M ESBL NOT DETECTED NOT DETECTED Final   Carbapenem resistance IMP NOT DETECTED NOT DETECTED Final   Carbapenem resistance KPC NOT DETECTED NOT DETECTED Final   Carbapenem resistance NDM NOT DETECTED NOT DETECTED Final   Carbapenem resist OXA 48 LIKE NOT DETECTED NOT DETECTED Final   Carbapenem resistance VIM NOT DETECTED NOT DETECTED Final    Comment: Performed at [facility], [address], [place], [place] [address]  Resp Panel by RT-PCR (Flu A&B, Covid) Anterior Nasal Swab     Status: None   Collection Time: 04/03/22 10:47 AM   Specimen: Anterior Nasal Swab  Result Value Ref Range Status   SARS Coronavirus 2 by RT PCR NEGATIVE NEGATIVE Final    Comment: (NOTE) SARS-CoV-2 target nucleic acids are NOT DETECTED.  The SARS-CoV-2 RNA is generally detectable in upper respiratory specimens during the acute phase of infection. The lowest concentration of  SARS-CoV-2 viral copies this assay can detect is 138 copies/mL. A negative result does not preclude SARS-Cov-2 infection and should not be used as the sole basis for treatment or other patient management decisions. A negative result may occur with  improper specimen collection/handling, submission of specimen other than nasopharyngeal swab, presence of viral mutation(s) within the areas targeted by this assay, and inadequate  number of viral copies(<138 copies/mL). A negative result must be combined with clinical observations, patient history, and epidemiological information. The expected result is Negative.  Fact Sheet for Patients:  EntrepreneurPulse.com.au  Fact Sheet for Healthcare Providers:  IncredibleEmployment.be  This test is no t yet approved or cleared by the [place] FDA and  has been authorized for detection and/or diagnosis of SARS-CoV-2 by FDA under an Emergency Use Authorization (EUA). This EUA will remain  in effect (meaning this test can be used) for the duration of the COVID-19 declaration under Section 564(b)(1) of the Act, 21 U.S.C.section 360bbb-3(b)(1), unless the authorization is terminated  or revoked sooner.       Influenza A by PCR NEGATIVE NEGATIVE Final   Influenza B by PCR NEGATIVE NEGATIVE Final    Comment: (NOTE) The Xpert Xpress SARS-CoV-2/FLU/RSV plus assay is intended as an aid in the diagnosis of influenza from Nasopharyngeal swab specimens and should not be used as a sole basis for treatment. Nasal washings and aspirates are unacceptable for Xpert Xpress SARS-CoV-2/FLU/RSV testing.  Fact Sheet for Patients: EntrepreneurPulse.com.au  Fact Sheet for Healthcare Providers: IncredibleEmployment.be  This test is not yet approved or cleared by the [place] FDA and has been authorized for detection and/or diagnosis of SARS-CoV-2 by FDA under an Emergency Use Authorization (EUA). This EUA will remain in effect (meaning this test can be used) for the duration of the COVID-19 declaration under Section 564(b)(1) of the Act, 21 U.S.C. section 360bbb-3(b)(1), unless the authorization is terminated or revoked.  Performed at [facility], [place] [address] [place], [place] [address]   C Difficile Quick Screen w PCR reflex     Status: None   Collection Time: 04/03/22  2:53 PM    Specimen: Nasal Mucosa; Stool  Result Value Ref Range Status   C Diff antigen NEGATIVE NEGATIVE Final   C Diff toxin NEGATIVE NEGATIVE Final   C Diff interpretation No C. difficile detected.  Final    Comment: Performed at [facility], [place] [address] [place],  [address]  Gastrointestinal Panel by PCR , Stool     Status: None   Collection Time: 04/03/22  2:54 PM   Specimen: Nasal Mucosa; Stool  Result Value Ref Range Status   Campylobacter species NOT DETECTED NOT DETECTED Final   Plesimonas shigelloides NOT DETECTED NOT DETECTED Final   Salmonella species NOT DETECTED NOT DETECTED Final   Yersinia enterocolitica NOT DETECTED NOT DETECTED Final   Vibrio species NOT DETECTED NOT DETECTED Final   Vibrio cholerae NOT DETECTED NOT DETECTED Final   Enteroaggregative E coli (EAEC) NOT DETECTED NOT DETECTED Final   Enteropathogenic E coli (EPEC) NOT DETECTED NOT DETECTED Final   Enterotoxigenic E coli (ETEC) NOT DETECTED NOT DETECTED Final   Shiga like toxin producing E coli (STEC) NOT DETECTED NOT DETECTED Final   Shigella/Enteroinvasive E coli (EIEC) NOT DETECTED NOT DETECTED Final   Cryptosporidium NOT DETECTED NOT DETECTED Final   Cyclospora cayetanensis NOT DETECTED NOT DETECTED Final   Entamoeba histolytica NOT DETECTED NOT DETECTED Final   Giardia lamblia NOT DETECTED NOT DETECTED Final  Adenovirus F40/41 NOT DETECTED NOT DETECTED Final   Astrovirus NOT DETECTED NOT DETECTED Final   Norovirus GI/GII NOT DETECTED NOT DETECTED Final   Rotavirus A NOT DETECTED NOT DETECTED Final   Sapovirus (I, II, IV, and V) NOT DETECTED NOT DETECTED Final    Comment: Performed at [facility], [address] [place], [place] [address]  Urine Culture     Status: Abnormal   Collection Time: 04/03/22  3:19 PM   Specimen: Urine, Catheterized  Result Value Ref Range Status   Specimen Description   Final    URINE, CATHETERIZED Performed at [place] [address], [place], [place] [address]    Special Requests   Final    NONE Performed at [facility], [place] [address] [place], [place] [address]    Culture 20,000 COLONIES/mL KLEBSIELLA OXYTOCA (A)  Final   Report Status 04/05/2022 FINAL  Final   Organism ID, Bacteria KLEBSIELLA OXYTOCA (A)  Final      Susceptibility   Klebsiella oxytoca - MIC*    AMPICILLIN >=32 RESISTANT Resistant     CEFAZOLIN <=4 SENSITIVE Sensitive     CEFEPIME <=0.12 SENSITIVE Sensitive     CEFTRIAXONE <=0.25 SENSITIVE Sensitive     CIPROFLOXACIN <=0.25 SENSITIVE Sensitive     GENTAMICIN <=1 SENSITIVE Sensitive     IMIPENEM <=0.25 SENSITIVE Sensitive     NITROFURANTOIN <=16 SENSITIVE Sensitive     TRIMETH/SULFA <=20 SENSITIVE Sensitive     AMPICILLIN/SULBACTAM 8 SENSITIVE Sensitive     PIP/TAZO <=4 SENSITIVE Sensitive     * 20,000 COLONIES/mL KLEBSIELLA OXYTOCA  MRSA Next Gen by PCR, Nasal     Status: None   Collection Time: 04/03/22  8:36 PM   Specimen: Nasal Mucosa; Nasal Swab  Result Value Ref Range Status   MRSA by PCR Next Gen NOT DETECTED NOT DETECTED Final    Comment: (NOTE) The GeneXpert MRSA Assay (FDA approved for NASAL specimens only), is one component of a comprehensive MRSA colonization surveillance program. It is not intended to diagnose MRSA infection nor to guide or monitor treatment for MRSA infections. Test performance is not FDA approved in patients less than 21 years old. Performed at [facility], [place] [address] [place], [place] [address]       Radiology Studies: No results found.     LOS: 3 days   Rikia Sukhu [employer] on [email or web address]  04/06/2022, 10:35 AM

## 2022-04-06 NOTE — Evaluation (Signed)
Occupational Therapy Evaluation Patient Details Name: Angela Mcpherson MRN: [id number] DOB: Jul 04, 1961 Today's Date: 04/06/2022   History of Present Illness Patient is a 61 year old female who was admitted for acute colitis with sepsis, acute metabolic encephalopathy, urinary retention, and mildly elevated troponin levels. 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 Patient's session was limited with HR up to 128 bpm with transfer to recliner in room. Patient asymptomatic. Patient lives at home with husband with no AE at baseline. Patient was noted to have decreased functional activity tolerance, decreased endurance, decreased standing balance, decreased safety awareness, and decreased knowledge of AD/AE impacting participation in ADLs. Patient would continue to benefit from skilled OT services at this time while admitted to address noted deficits in order to improve overall safety and independence in ADLs.        Recommendations for follow up therapy are one component of a multi-disciplinary discharge planning process, led by the attending physician.  Recommendations may be updated based on patient status, additional functional criteria and insurance authorization.   Follow Up Recommendations  No OT follow up    Assistance Recommended at Discharge Frequent or constant Supervision/Assistance  Patient can return home with the following A little help with walking and/or transfers;A little help with bathing/dressing/bathroom;Assistance with cooking/housework;Direct supervision/assist for financial management;Assist for transportation;Help with stairs or ramp for entrance;Direct supervision/assist for medications management    Functional Status Assessment  Patient has had a recent decline in their functional status and demonstrates the ability to make significant improvements in function in a  reasonable and predictable amount of time.  Equipment Recommendations  None recommended by OT    Recommendations for Other Services       Precautions / Restrictions Precautions Precaution Comments: monitor HR Restrictions Weight Bearing Restrictions: No      Mobility Bed Mobility Overal bed mobility: Needs Assistance Bed Mobility: Supine to Sit, Sit to Supine     Supine to sit: Min guard, HOB elevated Sit to supine: Min assist        Transfers Overall transfer level: Needs assistance   Transfers: Sit to/from Stand Sit to Stand: Min guard                  Balance Overall balance assessment: Mild deficits observed, not formally tested                                         ADL either performed or assessed with clinical judgement   ADL Overall ADL's : Needs assistance/impaired Eating/Feeding: Set up;Sitting   Grooming: Minimal assistance;Sitting   Upper Body Bathing: Minimal assistance;Sitting   Lower Body Bathing: Minimal assistance;Sit to/from stand;Sitting/lateral leans   Upper Body Dressing : Set up;Sitting   Lower Body Dressing: Minimal assistance;Sit to/from stand;Sitting/lateral leans Lower Body Dressing Details (indicate cue type and reason): patient needed mod A to don sock back on when patient was noted to sit in half w position to attempt to doff sock with increased A to avoid pulling lines. easily distracted.   Toilet Transfer Details (indicate cue type and reason): HR ntoed to increase to 128 bpm with transfer to recliner in room. patient sat for a few mins then stood and transferred back to bed with min guard without warning. Toileting- Clothing Manipulation and  Hygiene: Maximal assistance;Sit to/from stand               Vision         Perception     Praxis      Pertinent Vitals/Pain Pain Assessment Pain Assessment: No/denies pain     Hand Dominance Right   Extremity/Trunk Assessment Upper Extremity  Assessment Upper Extremity Assessment: Generalized weakness   Lower Extremity Assessment Lower Extremity Assessment: Defer to PT evaluation   Cervical / Trunk Assessment Cervical / Trunk Assessment: Normal   Communication Communication Communication: No difficulties   Cognition Arousal/Alertness: Awake/alert Behavior During Therapy: Flat affect Overall Cognitive Status: Difficult to assess                                 General Comments: patient was noted to get easily distracted and answer questions about 50% of time. patients husband was in room and able to provide some PLOF     General Comments       Exercises     Shoulder Instructions      Home Living Family/patient expects to be discharged to:: Private residence Living Arrangements: Spouse/significant other Available Help at Discharge: Family;Available PRN/intermittently Type of Home: House Home Access: Stairs to enter     Home Layout: One level;Laundry or work area in basement     [employer] Shower/Tub: Teacher, early years/pre: [employer]: None          Prior Functioning/Environment Prior Level of Function : Independent/Modified Independent                        OT Problem List: Decreased strength;Decreased activity tolerance;Impaired balance (sitting and/or standing);Decreased safety awareness;Cardiopulmonary status limiting activity;Decreased knowledge of precautions;Decreased knowledge of use of DME or AE      OT Treatment/Interventions: Self-care/ADL training;Therapeutic exercise;Neuromuscular education;Energy conservation;DME and/or AE instruction;Therapeutic activities;Balance training;Patient/family education    OT Goals(Current goals can be found in the care plan section) Acute Rehab OT Goals Patient Stated Goal: to go home OT Goal Formulation: With patient/family Time For Goal Achievement: 04/20/22 Potential to Achieve Goals: Fair  OT  Frequency: Min 2X/week    Co-evaluation              AM-PAC OT "6 Clicks" Daily Activity     Outcome Measure Help from another person eating meals?: None Help from another person taking care of personal grooming?: A Little Help from another person toileting, which includes using toliet, bedpan, or urinal?: A Little Help from another person bathing (including washing, rinsing, drying)?: A Lot Help from another person to put on and taking off regular upper body clothing?: A Little Help from another person to put on and taking off regular lower body clothing?: A Lot 6 Click Score: 17   End of Session Equipment Utilized During Treatment: Gait belt Nurse Communication: Mobility status;Other (comment) (HR during session)  Activity Tolerance: Other (comment) (increased HR) Patient left: in bed;with call bell/phone within reach;with family/visitor present  OT Visit Diagnosis: Unsteadiness on feet (R26.81);Other abnormalities of gait and mobility (R26.89);Muscle weakness (generalized) (M62.81)                Time: 6283-6629 OT Time Calculation (min): 17 min Charges:  OT General Charges $OT Visit: 1 Visit OT Evaluation $OT Eval Low Complexity: 1 Low  Jackelyn Poling OTR/L, MS Acute Rehabilitation Department Office# [phone] Pager#  [email or web address] 04/06/2022, 3:51 PM

## 2022-04-06 NOTE — Progress Notes (Signed)
  Echocardiogram 2D Echocardiogram has been performed.  Merrie Roof F 04/06/2022, 10:29 AM

## 2022-04-07 DIAGNOSIS — K529 Noninfective gastroenteritis and colitis, unspecified: Secondary | ICD-10-CM

## 2022-04-07 DIAGNOSIS — C19 Malignant neoplasm of rectosigmoid junction: Secondary | ICD-10-CM

## 2022-04-07 DIAGNOSIS — R7881 Bacteremia: Secondary | ICD-10-CM

## 2022-04-07 DIAGNOSIS — A4151 Sepsis due to Escherichia coli [E. coli]: Secondary | ICD-10-CM | POA: Diagnosis not present

## 2022-04-07 DIAGNOSIS — B9620 Unspecified Escherichia coli [E. coli] as the cause of diseases classified elsewhere: Secondary | ICD-10-CM | POA: Diagnosis not present

## 2022-04-07 DIAGNOSIS — R338 Other retention of urine: Secondary | ICD-10-CM | POA: Diagnosis not present

## 2022-04-07 LAB — CBC WITH DIFFERENTIAL/PLATELET
Abs Immature Granulocytes: 0.2 10*3/uL — ABNORMAL HIGH (ref 0.00–0.07)
Basophils Absolute: 0 10*3/uL (ref 0.0–0.1)
Basophils Relative: 0 %
Eosinophils Absolute: 0.1 10*3/uL (ref 0.0–0.5)
Eosinophils Relative: 0 %
HCT: 30.5 % — ABNORMAL LOW (ref 36.0–46.0)
Hemoglobin: 10.5 g/dL — ABNORMAL LOW (ref 12.0–15.0)
Immature Granulocytes: 1 %
Lymphocytes Relative: 9 %
Lymphs Abs: 1.4 10*3/uL (ref 0.7–4.0)
MCH: 34.1 pg — ABNORMAL HIGH (ref 26.0–34.0)
MCHC: 34.4 g/dL (ref 30.0–36.0)
MCV: 99 fL (ref 80.0–100.0)
Monocytes Absolute: 1.3 10*3/uL — ABNORMAL HIGH (ref 0.1–1.0)
Monocytes Relative: 8 %
Neutro Abs: 13.6 10*3/uL — ABNORMAL HIGH (ref 1.7–7.7)
Neutrophils Relative %: 82 %
Platelets: 60 10*3/uL — ABNORMAL LOW (ref 150–400)
RBC: 3.08 MIL/uL — ABNORMAL LOW (ref 3.87–5.11)
RDW: 23.8 % — ABNORMAL HIGH (ref 11.5–15.5)
WBC: 16.6 10*3/uL — ABNORMAL HIGH (ref 4.0–10.5)
nRBC: 0 % (ref 0.0–0.2)

## 2022-04-07 LAB — COMPREHENSIVE METABOLIC PANEL
ALT: 28 U/L (ref 0–44)
AST: 42 U/L — ABNORMAL HIGH (ref 15–41)
Albumin: 2.1 g/dL — ABNORMAL LOW (ref 3.5–5.0)
Alkaline Phosphatase: 81 U/L (ref 38–126)
Anion gap: 4 — ABNORMAL LOW (ref 5–15)
BUN: 15 mg/dL (ref 6–20)
CO2: 20 mmol/L — ABNORMAL LOW (ref 22–32)
Calcium: 7.7 mg/dL — ABNORMAL LOW (ref 8.9–10.3)
Chloride: 112 mmol/L — ABNORMAL HIGH (ref 98–111)
Creatinine, Ser: 0.63 mg/dL (ref 0.44–1.00)
GFR, Estimated: >60 mL/min (ref >60)
Glucose, Bld: 120 mg/dL — ABNORMAL HIGH (ref 70–99)
Potassium: 3.8 mmol/L (ref 3.5–5.1)
Sodium: 136 mmol/L (ref 135–145)
Total Bilirubin: 1.9 mg/dL — ABNORMAL HIGH (ref 0.3–1.2)
Total Protein: 4.6 g/dL — ABNORMAL LOW (ref 6.5–8.1)

## 2022-04-07 LAB — MAGNESIUM: Magnesium: 2 mg/dL (ref 1.7–2.4)

## 2022-04-07 MED ORDER — APIXABAN 5 MG PO TABS
5.0000 mg | ORAL_TABLET | Freq: Two times a day (BID) | ORAL | Status: DC
  Administered 2022-04-07 – 2022-04-08 (×3): 5 mg via ORAL
  Filled 2022-04-07 (×3): qty 1

## 2022-04-07 MED ORDER — SODIUM CHLORIDE 0.9 % IV SOLN
2.0000 g | Freq: Two times a day (BID) | INTRAVENOUS | Status: DC
  Administered 2022-04-07 – 2022-04-08 (×2): 2 g via INTRAVENOUS
  Filled 2022-04-07 (×2): qty 12.5

## 2022-04-07 NOTE — Consult Note (Signed)
[facility] for Infectious Disease    Date of Admission:  04/03/2022     Reason for Consult: E coli bacteremia     Referring Physician: Dr [place] Pink  Current antibiotics: Ceftriaxone    ASSESSMENT:    61 y.o. female admitted with:  Colitis and secondary E coli bacteremia:  Currently on Ceftriaxone and clinically appears to be feeling better, however, with rising leukocytosis of unclear significance.   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 Urinary retention: Foley placed. Sepsis: Due to #1.   RECOMMENDATIONS:    Continue ceftriaxone 2gm daily Lab monitoring Will follow.  Suspect she just may need some time to see WBC start to decline.  If does not do so, then could consider repeat imaging in 48hrs to ensure no complication has developed   Principal Problem:   Sepsis ([place]) Active Problems:   Malignant neoplasm of rectosigmoid junction ([place])   Acute encephalopathy   Aortic mural thrombus ([facility])   Thrombocytopenia ([facility])   Protein-calorie malnutrition, severe   Normocytic anemia   Elevated troponin   Acute urinary retention   Malnutrition of moderate degree   MEDICATIONS:    Scheduled Meds:  apixaban  5 mg Oral BID   Chlorhexidine Gluconate Cloth  6 each Topical Daily   feeding supplement  1 Container Oral Q24H   feeding supplement  237 mL Oral BID BM   multivitamin with minerals  1 tablet Oral Daily   Continuous Infusions:  cefTRIAXone (ROCEPHIN)  IV 2 g (04/07/22 0817)   PRN Meds:.acetaminophen **OR** acetaminophen, loperamide, oxyCODONE, prochlorperazine  HPI:    Angela Mcpherson is a 61 y.o. female with past medical history notable for metastatic rectosigmoid adenocarcinoma currently on chemotherapy followed by oncology who presented 04/03/2022 with abdominal pain, confusion, lethargy, and loose stools.  She underwent CT imaging in the emergency department which showed wall thickening of the colon consistent with  colitis.  She also had a distended urinary bladder consistent with urinary retention.  C. difficile and GI pathogen panel testing were both negative.  Her blood cultures have grown E. coli and she is currently on ceftriaxone.  She has been afebrile since 8/19 at 8 PM on current antibiotics, however, her white blood cell count has increased from 12.0 up to 16.6 today.  Her other labs appear fairly stable.  Due to her urinary retention, a urinary catheter had to be placed which was done 3 days ago.  The plan is for a voiding trial once she has mobilized.  We are consulted for further antibiotic recommendations given her increasing leukocytosis.   Past Medical History:  Diagnosis Date   Colorectal cancer ([place])    Family history of brain cancer    Family history of prostate cancer    Family history of stomach cancer    Heart murmur    dx at 26   MVP is stable   Hemorrhoids     Social History   Tobacco Use   Smoking status: Every Day    Packs/day: 1.00    Years: 26.00    Total pack years: 26.00    Types: Cigarettes   Smokeless tobacco: Never  Vaping Use   Vaping Use: Never used  Substance Use Topics   Alcohol use: Not Currently    Comment: used to drink moderately   Drug use: Never    Family History  Problem Relation Age of Onset   Prostate cancer Father    Brain cancer Maternal Aunt  Prostate cancer Paternal Uncle    Stomach cancer Maternal Grandfather 49   Colon cancer Neg Hx    Colon polyps Neg Hx    Esophageal cancer Neg Hx    Rectal cancer Neg Hx     Allergies  Allergen Reactions   Codeine     unknown   Epinephrine (Anaphylaxis)     unknown   Orange Fruit [Citrus] Other (See Comments)    Blisters on face.   Streptogramins     unknown   Tomato Other (See Comments)    Blisters on face    Review of Systems  All other systems reviewed and are negative.  Except as noted above.    OBJECTIVE:   Blood pressure 110/82, pulse 97, temperature 98.7 F (37.1 C),  temperature source Oral, resp. rate 20, height 5' 4"  (1.626 m), weight 49.3 kg, SpO2 96 %. Body mass index is 18.66 kg/m.  Physical Exam Constitutional:      General: She is not in acute distress.    Appearance: Normal appearance.  HENT:     Head: Normocephalic and atraumatic.     Mouth/Throat:     Mouth: Mucous membranes are moist.     Pharynx: Oropharynx is clear.  Eyes:     Extraocular Movements: Extraocular movements intact.     Conjunctiva/sclera: Conjunctivae normal.  Pulmonary:     Effort: Pulmonary effort is normal. No respiratory distress.  Abdominal:     General: There is no distension.     Palpations: Abdomen is soft.     Tenderness: There is no abdominal tenderness.  Genitourinary:    Comments: Foley catheter in place.  Musculoskeletal:        General: Normal range of motion.     Cervical back: Normal range of motion and neck supple.     Right lower leg: No edema.     Left lower leg: No edema.  Neurological:     General: No focal deficit present.     Mental Status: She is alert and oriented to person, place, and time.  Psychiatric:        Mood and Affect: Mood normal.        Behavior: Behavior normal.      Lab Results: Lab Results  Component Value Date   WBC 16.6 (H) 04/07/2022   HGB 10.5 (L) 04/07/2022   HCT 30.5 (L) 04/07/2022   MCV 99.0 04/07/2022   PLT 60 (L) 04/07/2022    Lab Results  Component Value Date   NA 136 04/07/2022   K 3.8 04/07/2022   CO2 20 (L) 04/07/2022   GLUCOSE 120 (H) 04/07/2022   BUN 15 04/07/2022   CREATININE 0.63 04/07/2022   CALCIUM 7.7 (L) 04/07/2022   GFRNONAA >60 04/07/2022   GFRAA  06/26/2010    >60        The eGFR has been calculated using the MDRD equation. This calculation has not been validated in all clinical situations. eGFR's persistently <60 mL/min signify possible Chronic Kidney Disease.    Lab Results  Component Value Date   ALT 28 04/07/2022   AST 42 (H) 04/07/2022   ALKPHOS 81 04/07/2022    BILITOT 1.9 (H) 04/07/2022    No results found for: "CRP"  No results found for: "ESRSEDRATE"  I have reviewed the micro and lab results in Epic.  Imaging: ECHOCARDIOGRAM COMPLETE  Result Date: 04/06/2022    ECHOCARDIOGRAM REPORT   Patient Name:   Angela Mcpherson Date of Exam: 04/06/2022 Medical Rec #:  [id number]      Height:       64.0 in Accession #:    [id number]     Weight:       108.7 lb Date of Birth:  October 01, 1960       BSA:          1.510 m Patient Age:    61 years       BP:           129/85 mmHg Patient Gender: F              HR:           116 bpm. Exam Location:  Inpatient Procedure: 2D Echo, Cardiac Doppler, Color Doppler and 3D Echo Indications:    Elevated troponins  History:        Patient has no prior history of Echocardiogram examinations.                 Arrythmias:Tachycardia.  Sonographer:    Merrie Roof RDCS Referring Phys: [place]  1. Left ventricular ejection fraction, by estimation, is 55 to 60%. Left ventricular ejection fraction by 3D volume is 63 %. The left ventricle has normal function. The left ventricle has no regional wall motion abnormalities.  2. Right ventricular systolic function is normal. The right ventricular size is normal. There is normal pulmonary artery systolic pressure. The estimated right ventricular systolic pressure is 41.6 mmHg.  3. Left atrial size was mildly dilated.  4. The mitral valve is normal in structure. No evidence of mitral valve regurgitation.  5. The aortic valve is tricuspid. There is mild calcification of the aortic valve. There is mild thickening of the aortic valve. Aortic valve regurgitation is not visualized. Aortic valve sclerosis/calcification is present, without any evidence of aortic stenosis.  6. The inferior vena cava is collapsed, consistent with low right atrial pressure. Conclusion(s)/Recommendation(s): Echo findings suggest hypovolemia. FINDINGS  Left Ventricle: Impaired relaxation pattern may be due to  hypovolemia (diastolic mitral annulus velocities are normal for age). Left ventricular ejection fraction, by estimation, is 55 to 60%. Left ventricular ejection fraction by 3D volume is 63 %. The  left ventricle has normal function. The left ventricle has no regional wall motion abnormalities. The left ventricular internal cavity size was normal in size. There is no left ventricular hypertrophy. Right Ventricle: The right ventricular size is normal. No increase in right ventricular wall thickness. Right ventricular systolic function is normal. There is normal pulmonary artery systolic pressure. The tricuspid regurgitant velocity is 2.59 m/s, and  with an assumed right atrial pressure of 0 mmHg, the estimated right ventricular systolic pressure is 60.6 mmHg. Left Atrium: Left atrial size was mildly dilated. Right Atrium: Right atrial size was normal in size. Pericardium: There is no evidence of pericardial effusion. Mitral Valve: The mitral valve is normal in structure. No evidence of mitral valve regurgitation. Tricuspid Valve: The tricuspid valve is normal in structure. Tricuspid valve regurgitation is trivial. Aortic Valve: The aortic valve is tricuspid. There is mild calcification of the aortic valve. There is mild thickening of the aortic valve. Aortic valve regurgitation is not visualized. Aortic valve sclerosis/calcification is present, without any evidence of aortic stenosis. Aortic valve mean gradient measures 10.3 mmHg. Aortic valve peak gradient measures 18.6 mmHg. Aortic valve area, by VTI measures 1.91 cm. Pulmonic Valve: The pulmonic valve was not well visualized. Pulmonic valve regurgitation is not visualized. Aorta: The aortic root and ascending aorta are structurally normal, with no  evidence of dilitation. Venous: The inferior vena cava is collapsed, consistent with low right atrial pressure. IAS/Shunts: The interatrial septum was not well visualized.  LEFT VENTRICLE PLAX 2D LVIDd:         4.30 cm    Diastology LVIDs:         3.10 cm   LV e' medial:    7.29 cm/s LV PW:         1.00 cm   LV E/e' medial:  10.8 LV IVS:        1.10 cm   LV e' lateral:   10.70 cm/s LVOT diam:     2.00 cm   LV E/e' lateral: 7.4 LV SV:         62 LV SV Index:   41 LVOT Area:     3.14 cm                           3D Volume EF:                          3D EF:        63 %                          LV EDV:       103 ml                          LV ESV:       38 ml                          LV SV:        65 ml RIGHT VENTRICLE RV Basal diam:  2.60 cm RV S prime:     11.40 cm/s TAPSE (M-mode): 2.6 cm LEFT ATRIUM           Index        RIGHT ATRIUM           Index LA diam:      2.90 cm 1.92 cm/m   RA Area:     14.30 cm LA Vol (A2C): 38.7 ml 25.63 ml/m  RA Volume:   30.20 ml  20.00 ml/m LA Vol (A4C): 52.0 ml 34.44 ml/m  AORTIC VALVE AV Area (Vmax):    2.01 cm AV Area (Vmean):   1.91 cm AV Area (VTI):     1.91 cm AV Vmax:           215.67 cm/s AV Vmean:          150.000 cm/s AV VTI:            0.324 m AV Peak Grad:      18.6 mmHg AV Mean Grad:      10.3 mmHg LVOT Vmax:         138.00 cm/s LVOT Vmean:        91.200 cm/s LVOT VTI:          0.197 m LVOT/AV VTI ratio: 0.61  AORTA Ao Root diam: 3.00 cm Ao Asc diam:  2.70 cm MITRAL VALVE               TRICUSPID VALVE MV Area (PHT): 4.71 cm    TR Peak grad:   26.8 mmHg MV Decel Time: 161 msec    TR Vmax:  259.00 cm/s MV E velocity: 78.80 cm/s MV A velocity: 98.10 cm/s  SHUNTS MV E/A ratio:  0.80        Systemic VTI:  0.20 m                            Systemic Diam: 2.00 cm Dani Gobble Croitoru MD Electronically signed by Sanda Klein MD Signature Date/Time: 04/06/2022/1:20:24 PM    Final      Imaging independently reviewed in Epic.  [facility] [phone] pager 04/07/2022, 11:21 AM

## 2022-04-07 NOTE — Evaluation (Signed)
Physical Therapy Evaluation Patient Details Name: Angela Mcpherson MRN: [id number] DOB: 23-Jan-1961 Today's Date: 04/07/2022  History of Present Illness  Patient 61 y.o. female presented 04/03/2022 with abdominal pain, confusion, lethargy, and loose stools.  She underwent CT imaging in the emergency department which showed wall thickening of the colon consistent with colitis.  She also had a distended urinary bladder consistent with urinary retention.  C. difficile and GI pathogen panel testing were both negative.  Her blood cultures have grown E. coli and she is currently on ceftriaxone.  She has been afebrile since 8/19 at 8 PM on current antibiotics. 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    Clinical Impression  Angela Mcpherson is 61 y.o. female admitted with above HPI and diagnosis. Patient is currently limited by functional impairments below (see PT problem list). Patient lives with her husband and is independent at baseline. Acute PT will follow and progress as able. Patient evaluated by Physical Therapy with no further acute PT needs identified. All education has been completed and the patient has no further questions. Patient is mobilizing at supervision level for gait and mod independent level for bed mobility and tranfers. She is safe to mobilize with RN/NT and mobility team. See below for any follow-up Physical Therapy or equipment needs. PT is signing off. Thank you for this referral.     Recommendations for follow up therapy are one component of a multi-disciplinary discharge planning process, led by the attending physician.  Recommendations may be updated based on patient status, additional functional criteria and insurance authorization.  Follow Up Recommendations No PT follow up      Assistance Recommended at Discharge Set up Supervision/Assistance  Patient can return home with the following  A little help with walking  and/or transfers;A little help with bathing/dressing/bathroom;Assistance with cooking/housework;Assist for transportation;Help with stairs or ramp for entrance    Equipment Recommendations None recommended by PT  Recommendations for Other Services       Functional Status Assessment Patient has had a recent decline in their functional status and demonstrates the ability to make significant improvements in function in a reasonable and predictable amount of time.     Precautions / Restrictions Precautions Precautions: Fall Precaution Comments: monitor HR Restrictions Weight Bearing Restrictions: No      Mobility  Bed Mobility Overal bed mobility: Needs Assistance, Modified Independent Bed Mobility: Supine to Sit, Sit to Supine     Supine to sit: Supervision, Modified independent (Device/Increase time) Sit to supine: Supervision, Modified independent (Device/Increase time)   General bed mobility comments: pt taking extra time    Transfers Overall transfer level: Needs assistance Equipment used: None Transfers: Sit to/from Stand Sit to Stand: Supervision                Ambulation/Gait Ambulation/Gait assistance: Supervision   Assistive device: Rolling walker (2 wheels)   Gait velocity: fair        Stairs            Wheelchair Mobility    Modified Rankin (Stroke Patients Only)       Balance Overall balance assessment: Mild deficits observed, not formally tested                                           Pertinent Vitals/Pain Pain Assessment Pain Assessment: No/denies pain    Home Living Family/patient  expects to be discharged to:: Private residence Living Arrangements: Spouse/significant other Available Help at Discharge: Family;Available PRN/intermittently Type of Home: House Home Access: Stairs to enter       Home Layout: One level;Laundry or work area in [employer]: None Additional Comments: bidet     Prior Function Prior Level of Function : Independent/Modified Independent                     Hand Dominance   Dominant Hand: Right    Extremity/Trunk Assessment   Upper Extremity Assessment Upper Extremity Assessment: Overall WFL for tasks assessed    Lower Extremity Assessment Lower Extremity Assessment: Overall WFL for tasks assessed    Cervical / Trunk Assessment Cervical / Trunk Assessment: Normal  Communication   Communication: No difficulties  Cognition Arousal/Alertness: Awake/alert Behavior During Therapy: WFL for tasks assessed/performed Overall Cognitive Status: Within Functional Limits for tasks assessed                                          General Comments      Exercises     Assessment/Plan    PT Assessment Patient does not need any further PT services  PT Problem List         PT Treatment Interventions      PT Goals (Current goals can be found in the Care Plan section)  Acute Rehab PT Goals Patient Stated Goal: get home and keep doing her exercises PT Goal Formulation: All assessment and education complete, DC therapy Time For Goal Achievement: 04/14/22 Potential to Achieve Goals: Good    Frequency       Co-evaluation               AM-PAC PT "6 Clicks" Mobility  Outcome Measure Help needed turning from your back to your side while in a flat bed without using bedrails?: None Help needed moving from lying on your back to sitting on the side of a flat bed without using bedrails?: None Help needed moving to and from a bed to a chair (including a wheelchair)?: A Little Help needed standing up from a chair using your arms (e.g., wheelchair or bedside chair)?: A Little Help needed to walk in hospital room?: A Little Help needed climbing 3-5 steps with a railing? : A Little 6 Click Score: 20    End of Session Equipment Utilized During Treatment: Gait belt Activity Tolerance: Patient tolerated treatment  well Patient left: in bed;with call bell/phone within reach Nurse Communication: Mobility status PT Visit Diagnosis: Unsteadiness on feet (R26.81);Difficulty in walking, not elsewhere classified (R26.2)    Time: 2620-3559 PT Time Calculation (min) (ACUTE ONLY): 17 min   Charges:   PT Evaluation $PT Eval Low Complexity: 1 Low          Verner Mould, DPT Acute Rehabilitation Services Office [phone] Pager [phone]  04/07/22 2:12 PM

## 2022-04-07 NOTE — Progress Notes (Signed)
[facility] PROGRESS NOTE   Angela Mcpherson ZJQ:[id number] DOB: 03/02/1961 DOA: 04/03/2022  PCP: Default, Provider, MD  Brief History/Interval Summary: 61 y.o. female with medical history significant for aortic mural thrombus, rectal cancer.  Presented with abdominal pain and confusion.  She had chemotherapy 2 days prior to admission.  Was noted to be lethargic and has been having loose stools at home.  Evaluation in the ED raised concern for colitis.  Patient was hospitalized for further management.    Consultants: Medical oncologist, Dr. Burr Medico.  Infectious disease  Procedures: None    Subjective/Interval History: Patient mentions that her diarrhea has resolved.  Wants to go home.  Denies any complaints this morning.  No nausea or vomiting.     Assessment/Plan:  Acute colitis/Sepsis, present on admission/E. coli bacteremia Lactic acid level was noted to be significantly elevated.  Patient was aggressively hydrated.  Blood pressure has improved.  Cortisol level was noted to be normal.  Lactic acid levels improved.  Significantly elevated procalcitonin level was noted likely due to bacteremia. C. difficile PCR is negative.  GI pathogen panel is negative. Blood culture positive for E. coli.  Noted to be sensitive to ceftriaxone. Initially placed on cefepime and metronidazole.  Changed over to ceftriaxone. Had fever overnight on 8/19 with increase in WBC over the last 48 hours.  No fever in the last 24 hours.  Significance unclear. We will request ID to evaluate patient.  History of left-sided colon Cancer Followed by medical oncology.  Has been on chemotherapy.  Last infusion was on August 7.  Acute metabolic encephalopathy No focal neurological deficits on examination.  Encephalopathy was most likely due to sepsis.  Now seems to be back to baseline.  Urinary retention Foley catheter had to be placed.  Continue to monitor. Will do voiding trial once the patient has been  mobilized.  Has not ambulated yet. UA reviewed.  Urine culture with 20,000 colonies of Klebsiella oxytoca.  Mildly elevated troponin levels Most likely demand ischemia.  Patient denies any chest pain.  No ischemic changes noted on EKG. Echocardiogram shows normal left ventricular systolic function without any wall motion abnormalities.  No further work-up at this time.  Hyperbilirubinemia Stable for the most part.  Most likely secondary to sepsis.  CT abdomen did not show any abnormalities in the hepatobiliary system.  History of aortic mural thrombus Eliquis being continued.  This was originally diagnosed in October 2022.  She was found to have noncalcified thrombus in the infrarenal abdominal aorta causing severe stenosis at that time.  Subsequent CT scan done and January 2023 showed significant interval decrease in the thrombus. Since there has been a decrease in thrombus size and no mention of the thrombus on subsequent imaging studies anticoagulation can be briefly held if needed.  Hypokalemia/hypomagnesemia Potassium finally improved now that her diarrhea appears to have subsided.  Will give additional dose of potassium today.  Magnesium noted to be low and will be supplemented.    Normocytic anemia/thrombocytopenia Etiology unclear.  No overt blood loss noted though her stool for occult blood was positive.  Check anemia panel. She has had thrombocytopenia for several months now.  Did drop to 39,000 on 8/18.  Seems to be fluctuating.  Up to 60,000 today.  Resume Eliquis.   DVT Prophylaxis: Eliquis can be resumed today. Code Status: Full code Family Communication: Discussed with patient.  No family at bedside Disposition Plan: Hopefully return home when improved.  Start mobilizing  Status is: Inpatient Remains  inpatient appropriate because: Sepsis, bacteremia, colitis      Medications: Scheduled:  Chlorhexidine Gluconate Cloth  6 each Topical Daily   feeding supplement  1  Container Oral Q24H   feeding supplement  237 mL Oral BID BM   multivitamin with minerals  1 tablet Oral Daily   Continuous:  cefTRIAXone (ROCEPHIN)  IV 2 g (04/07/22 0817)   WGN:[id number] **OR** acetaminophen, loperamide, oxyCODONE, prochlorperazine  Antibiotics: Anti-infectives (From admission, onward)    Start     Dose/Rate Route Frequency Ordered Stop   04/04/22 0900  cefTRIAXone (ROCEPHIN) 2 g in sodium chloride 0.9 % 100 mL IVPB        2 g 200 mL/hr over 30 Minutes Intravenous Every 24 hours 04/04/22 0257     04/03/22 1700  ceFEPIme (MAXIPIME) 2 g in sodium chloride 0.9 % 100 mL IVPB  Status:  Discontinued        2 g 200 mL/hr over 30 Minutes Intravenous Every 8 hours 04/03/22 1634 04/04/22 0257   04/03/22 1500  metroNIDAZOLE (FLAGYL) IVPB 500 mg  Status:  Discontinued        500 mg 100 mL/hr over 60 Minutes Intravenous Every 12 hours 04/03/22 1446 04/04/22 0257   04/03/22 1045  ceFEPIme (MAXIPIME) 2 g in sodium chloride 0.9 % 100 mL IVPB        2 g 200 mL/hr over 30 Minutes Intravenous  Once 04/03/22 1031 04/03/22 1429   04/03/22 1045  vancomycin (VANCOCIN) IVPB 1000 mg/200 mL premix        1,000 mg 200 mL/hr over 60 Minutes Intravenous  Once 04/03/22 1031 04/03/22 1241       Objective:  Vital Signs  Vitals:   04/06/22 1739 04/06/22 1941 04/07/22 0000 04/07/22 0344  BP: 111/79 108/74 111/80 110/82  Pulse: (!) 114 97 100 97  Resp: '20  18 20  '$ Temp: 98.5 F (36.9 C) 97.9 F (36.6 C) 97.9 F (36.6 C) 98.7 F (37.1 C)  TempSrc: Oral Oral Oral Oral  SpO2: 96% 96% 95% 96%  Weight:      Height:        Intake/Output Summary (Last 24 hours) at 04/07/2022 1039 Last data filed at 04/07/2022 0817 Gross per 24 hour  Intake 1746.32 ml  Output 1150 ml  Net 596.32 ml    Filed Weights   04/04/22 0847  Weight: 49.3 kg    General appearance: Awake alert.  In no distress Resp: Clear to auscultation bilaterally.  Normal effort Cardio: S1-S2 is normal regular.  No  S3-S4.  No rubs murmurs or bruit GI: Abdomen is soft.  Nontender nondistended.  Bowel sounds are present normal.  No masses organomegaly Extremities: No edema.  Full range of motion of lower extremities. Neurologic: Alert and oriented x3.  No focal neurological deficits.       Lab Results:  Data Reviewed: I have personally reviewed following labs and reports of the imaging studies  CBC: Recent Labs  Lab 04/03/22 1035 04/04/22 0336 04/05/22 0502 04/06/22 0352 04/07/22 0332  WBC 13.3* 6.9 8.1 12.0* 16.6*  NEUTROABS 11.5*  --  6.7 9.3* 13.6*  HGB 11.5* 9.5* 11.5* 10.0* 10.5*  HCT 33.8* 28.2* 32.7* 28.9* 30.5*  MCV 99.1 99.3 97.3 98.3 99.0  PLT 72* 39* 56* 46* 60*     Basic Metabolic Panel: Recent Labs  Lab 04/03/22 1126 04/04/22 0336 04/04/22 0744 04/05/22 0502 04/06/22 0352 04/07/22 0332  NA 137 140  --  137 136 136  K 3.5  3.4*  --  2.9* 3.8 3.8  CL 105 115*  --  109 111 112*  CO2 24 21*  --  23 22 20*  GLUCOSE 128* 130*  --  167* 116* 120*  BUN 38* 32*  --  '19 17 15  '$ CREATININE 0.72 0.67  --  0.76 0.70 0.63  CALCIUM 8.3* 7.6*  --  8.4* 7.9* 7.7*  MG  --   --  2.1  --  1.6* 2.0     GFR: Estimated Creatinine Clearance: 58.2 mL/min (by C-G formula based on SCr of 0.63 mg/dL).  Liver Function Tests: Recent Labs  Lab 04/03/22 1126 04/04/22 0336 04/05/22 0502 04/05/22 1016 04/06/22 0352 04/07/22 0332  AST 33 23 32  --  32 42*  ALT '18 15 19  '$ --  22 28  ALKPHOS 83 67 91  --  72 81  BILITOT 2.7* 2.0* 1.6* 1.6* 1.8* 1.9*  PROT 5.6* 4.3* 5.1*  --  4.5* 4.6*  ALBUMIN 2.6* 2.1* 2.4*  --  2.1* 2.1*     Recent Labs  Lab 04/03/22 1126  LIPASE 44    Recent Labs  Lab 04/03/22 1426  AMMONIA 34     Coagulation Profile: Recent Labs  Lab 04/03/22 1035 04/04/22 0336  INR 3.0* 4.0*      Recent Results (from the past 240 hour(s))  Culture, blood (routine x 2)     Status: None (Preliminary result)   Collection Time: 04/03/22 10:40 AM   Specimen:  BLOOD  Result Value Ref Range Status   Specimen Description   Final    BLOOD RIGHT ANTECUBITAL Performed at [facility], [place] [address], [place], [place] [address]    Special Requests   Final    BOTTLES DRAWN AEROBIC AND ANAEROBIC Blood Culture results may not be optimal due to an inadequate volume of blood received in culture bottles Performed at [place] [address], [place], [place] [address]    Culture  Setup Time   Final    GRAM NEGATIVE RODS IN BOTH AEROBIC AND ANAEROBIC BOTTLES CRITICAL VALUE NOTED.  VALUE IS CONSISTENT WITH PREVIOUSLY REPORTED AND CALLED VALUE. Performed at [facility], [place] [address] [place], [place] [address]    Culture GRAM NEGATIVE RODS  Final   Report Status PENDING  Incomplete  Culture, blood (routine x 2)     Status: Abnormal   Collection Time: 04/03/22 10:40 AM   Specimen: BLOOD  Result Value Ref Range Status   Specimen Description   Final    BLOOD LEFT ANTECUBITAL Performed at [facility] [address], [place], [place] [address]    Special Requests   Final    BOTTLES DRAWN AEROBIC AND ANAEROBIC Blood Culture adequate volume Performed at [facility] [address], [place], [place] [address]    Culture  Setup Time   Final    GRAM NEGATIVE RODS IN BOTH AEROBIC AND ANAEROBIC BOTTLES CRITICAL RESULT CALLED TO, READ BACK BY AND VERIFIED WITH: M LILLISTON,PHARMD'@0138'$  04/04/22 [place] Performed at [facility], [place] [address], [place], [place] [address]    Culture ESCHERICHIA COLI (A)  Final   Report Status 04/06/2022 FINAL  Final   Organism ID, Bacteria ESCHERICHIA COLI  Final      Susceptibility   Escherichia coli - MIC*    AMPICILLIN RESISTANT Resistant     CEFAZOLIN 32 INTERMEDIATE Intermediate     CEFEPIME <=0.12 SENSITIVE Sensitive  CEFTAZIDIME <=1 SENSITIVE Sensitive     CEFTRIAXONE <=0.25 SENSITIVE Sensitive     CIPROFLOXACIN <=0.25  SENSITIVE Sensitive     GENTAMICIN <=1 SENSITIVE Sensitive     IMIPENEM <=0.25 SENSITIVE Sensitive     TRIMETH/SULFA <=20 SENSITIVE Sensitive     AMPICILLIN/SULBACTAM <=2 SENSITIVE Sensitive     PIP/TAZO <=4 SENSITIVE Sensitive     * ESCHERICHIA COLI  Blood Culture ID Panel (Reflexed)     Status: Abnormal   Collection Time: 04/03/22 10:40 AM  Result Value Ref Range Status   Enterococcus faecalis NOT DETECTED NOT DETECTED Final   Enterococcus Faecium NOT DETECTED NOT DETECTED Final   Listeria monocytogenes NOT DETECTED NOT DETECTED Final   Staphylococcus species NOT DETECTED NOT DETECTED Final   Staphylococcus aureus (BCID) NOT DETECTED NOT DETECTED Final   Staphylococcus epidermidis NOT DETECTED NOT DETECTED Final   Staphylococcus lugdunensis NOT DETECTED NOT DETECTED Final   Streptococcus species NOT DETECTED NOT DETECTED Final   Streptococcus agalactiae NOT DETECTED NOT DETECTED Final   Streptococcus pneumoniae NOT DETECTED NOT DETECTED Final   Streptococcus pyogenes NOT DETECTED NOT DETECTED Final   A.calcoaceticus-baumannii NOT DETECTED NOT DETECTED Final   Bacteroides fragilis NOT DETECTED NOT DETECTED Final   Enterobacterales DETECTED (A) NOT DETECTED Final    Comment: Enterobacterales represent a large order of gram negative bacteria, not a single organism. CRITICAL RESULT CALLED TO, READ BACK BY AND VERIFIED WITH: M LILLISTON,PHARMD'@0138'$  04/04/22 [place]    Enterobacter cloacae complex NOT DETECTED NOT DETECTED Final   Escherichia coli DETECTED (A) NOT DETECTED Final    Comment: CRITICAL RESULT CALLED TO, READ BACK BY AND VERIFIED WITH: M LILLISTON,PHARMD'@0138'$  04/04/22 [place]    Klebsiella aerogenes NOT DETECTED NOT DETECTED Final   Klebsiella oxytoca NOT DETECTED NOT DETECTED Final   Klebsiella pneumoniae NOT DETECTED NOT DETECTED Final   Proteus species NOT DETECTED NOT DETECTED Final   Salmonella species NOT DETECTED NOT DETECTED Final   Serratia marcescens NOT DETECTED NOT  DETECTED Final   Haemophilus influenzae NOT DETECTED NOT DETECTED Final   Neisseria meningitidis NOT DETECTED NOT DETECTED Final   Pseudomonas aeruginosa NOT DETECTED NOT DETECTED Final   Stenotrophomonas maltophilia NOT DETECTED NOT DETECTED Final   Candida albicans NOT DETECTED NOT DETECTED Final   Candida auris NOT DETECTED NOT DETECTED Final   Candida glabrata NOT DETECTED NOT DETECTED Final   Candida krusei NOT DETECTED NOT DETECTED Final   Candida parapsilosis NOT DETECTED NOT DETECTED Final   Candida tropicalis NOT DETECTED NOT DETECTED Final   Cryptococcus neoformans/gattii NOT DETECTED NOT DETECTED Final   CTX-M ESBL NOT DETECTED NOT DETECTED Final   Carbapenem resistance IMP NOT DETECTED NOT DETECTED Final   Carbapenem resistance KPC NOT DETECTED NOT DETECTED Final   Carbapenem resistance NDM NOT DETECTED NOT DETECTED Final   Carbapenem resist OXA 48 LIKE NOT DETECTED NOT DETECTED Final   Carbapenem resistance VIM NOT DETECTED NOT DETECTED Final    Comment: Performed at [facility], [address], [place], [place] [address]  Resp Panel by RT-PCR (Flu A&B, Covid) Anterior Nasal Swab     Status: None   Collection Time: 04/03/22 10:47 AM   Specimen: Anterior Nasal Swab  Result Value Ref Range Status   SARS Coronavirus 2 by RT PCR NEGATIVE NEGATIVE Final    Comment: (NOTE) SARS-CoV-2 target nucleic acids are NOT DETECTED.  The SARS-CoV-2 RNA is generally detectable in upper respiratory specimens during the acute phase of infection. The lowest concentration of  SARS-CoV-2 viral copies this assay can detect is 138 copies/mL. A negative result does not preclude SARS-Cov-2 infection and should not be used as the sole basis for treatment or other patient management decisions. A negative result may occur with  improper specimen collection/handling, submission of specimen other than nasopharyngeal swab, presence of viral mutation(s) within the areas targeted by this assay,  and inadequate number of viral copies(<138 copies/mL). A negative result must be combined with clinical observations, patient history, and epidemiological information. The expected result is Negative.  Fact Sheet for Patients:  EntrepreneurPulse.com.au  Fact Sheet for Healthcare Providers:  IncredibleEmployment.be  This test is no t yet approved or cleared by the [place] FDA and  has been authorized for detection and/or diagnosis of SARS-CoV-2 by FDA under an Emergency Use Authorization (EUA). This EUA will remain  in effect (meaning this test can be used) for the duration of the COVID-19 declaration under Section 564(b)(1) of the Act, 21 U.S.C.section 360bbb-3(b)(1), unless the authorization is terminated  or revoked sooner.       Influenza A by PCR NEGATIVE NEGATIVE Final   Influenza B by PCR NEGATIVE NEGATIVE Final    Comment: (NOTE) The Xpert Xpress SARS-CoV-2/FLU/RSV plus assay is intended as an aid in the diagnosis of influenza from Nasopharyngeal swab specimens and should not be used as a sole basis for treatment. Nasal washings and aspirates are unacceptable for Xpert Xpress SARS-CoV-2/FLU/RSV testing.  Fact Sheet for Patients: EntrepreneurPulse.com.au  Fact Sheet for Healthcare Providers: IncredibleEmployment.be  This test is not yet approved or cleared by the [place] FDA and has been authorized for detection and/or diagnosis of SARS-CoV-2 by FDA under an Emergency Use Authorization (EUA). This EUA will remain in effect (meaning this test can be used) for the duration of the COVID-19 declaration under Section 564(b)(1) of the Act, 21 U.S.C. section 360bbb-3(b)(1), unless the authorization is terminated or revoked.  Performed at [facility], [place] [address], [place], [place] [address]   C Difficile Quick Screen w PCR reflex     Status: None   Collection Time:  04/03/22  2:53 PM   Specimen: Nasal Mucosa; Stool  Result Value Ref Range Status   C Diff antigen NEGATIVE NEGATIVE Final   C Diff toxin NEGATIVE NEGATIVE Final   C Diff interpretation No C. difficile detected.  Final    Comment: Performed at [facility], [place] [address], [place],  [address]  Gastrointestinal Panel by PCR , Stool     Status: None   Collection Time: 04/03/22  2:54 PM   Specimen: Nasal Mucosa; Stool  Result Value Ref Range Status   Campylobacter species NOT DETECTED NOT DETECTED Final   Plesimonas shigelloides NOT DETECTED NOT DETECTED Final   Salmonella species NOT DETECTED NOT DETECTED Final   Yersinia enterocolitica NOT DETECTED NOT DETECTED Final   Vibrio species NOT DETECTED NOT DETECTED Final   Vibrio cholerae NOT DETECTED NOT DETECTED Final   Enteroaggregative E coli (EAEC) NOT DETECTED NOT DETECTED Final   Enteropathogenic E coli (EPEC) NOT DETECTED NOT DETECTED Final   Enterotoxigenic E coli (ETEC) NOT DETECTED NOT DETECTED Final   Shiga like toxin producing E coli (STEC) NOT DETECTED NOT DETECTED Final   Shigella/Enteroinvasive E coli (EIEC) NOT DETECTED NOT DETECTED Final   Cryptosporidium NOT DETECTED NOT DETECTED Final   Cyclospora cayetanensis NOT DETECTED NOT DETECTED Final   Entamoeba histolytica NOT DETECTED NOT DETECTED Final   Giardia lamblia NOT DETECTED NOT DETECTED Final  Adenovirus F40/41 NOT DETECTED NOT DETECTED Final   Astrovirus NOT DETECTED NOT DETECTED Final   Norovirus GI/GII NOT DETECTED NOT DETECTED Final   Rotavirus A NOT DETECTED NOT DETECTED Final   Sapovirus (I, II, IV, and V) NOT DETECTED NOT DETECTED Final    Comment: Performed at [facility], [address] [place], [place] [address]  Urine Culture     Status: Abnormal   Collection Time: 04/03/22  3:19 PM   Specimen: Urine, Catheterized  Result Value Ref Range Status   Specimen Description   Final    URINE, CATHETERIZED Performed at  [place] [address], [place], [place] [address]    Special Requests   Final    NONE Performed at [facility], [place] [address], [place], [place] [address]    Culture 20,000 COLONIES/mL KLEBSIELLA OXYTOCA (A)  Final   Report Status 04/05/2022 FINAL  Final   Organism ID, Bacteria KLEBSIELLA OXYTOCA (A)  Final      Susceptibility   Klebsiella oxytoca - MIC*    AMPICILLIN >=32 RESISTANT Resistant     CEFAZOLIN <=4 SENSITIVE Sensitive     CEFEPIME <=0.12 SENSITIVE Sensitive     CEFTRIAXONE <=0.25 SENSITIVE Sensitive     CIPROFLOXACIN <=0.25 SENSITIVE Sensitive     GENTAMICIN <=1 SENSITIVE Sensitive     IMIPENEM <=0.25 SENSITIVE Sensitive     NITROFURANTOIN <=16 SENSITIVE Sensitive     TRIMETH/SULFA <=20 SENSITIVE Sensitive     AMPICILLIN/SULBACTAM 8 SENSITIVE Sensitive     PIP/TAZO <=4 SENSITIVE Sensitive     * 20,000 COLONIES/mL KLEBSIELLA OXYTOCA  MRSA Next Gen by PCR, Nasal     Status: None   Collection Time: 04/03/22  8:36 PM   Specimen: Nasal Mucosa; Nasal Swab  Result Value Ref Range Status   MRSA by PCR Next Gen NOT DETECTED NOT DETECTED Final    Comment: (NOTE) The GeneXpert MRSA Assay (FDA approved for NASAL specimens only), is one component of a comprehensive MRSA colonization surveillance program. It is not intended to diagnose MRSA infection nor to guide or monitor treatment for MRSA infections. Test performance is not FDA approved in patients less than 26 years old. Performed at [facility], [place] [address], [place], [place] [address]       Radiology Studies: ECHOCARDIOGRAM COMPLETE  Result Date: 04/06/2022    ECHOCARDIOGRAM REPORT   Patient Name:   Angela Mcpherson Date of Exam: 04/06/2022 Medical Rec #:  [id number]      Height:       64.0 in Accession #:    [id number]     Weight:       108.7 lb Date of Birth:  [birth year]       BSA:          1.510 m Patient Age:    23 years       BP:           129/85 mmHg  Patient Gender: F              HR:           116 bpm. Exam Location:  Inpatient Procedure: 2D Echo, Cardiac Doppler, Color Doppler and 3D Echo Indications:    Elevated troponins  History:        Patient has no prior history of Echocardiogram examinations.                 Arrythmias:Tachycardia.  Sonographer:    Apolonio Schneiders  Orene Desanctis RDCS Referring Phys: [place]  1. Left ventricular ejection fraction, by estimation, is 55 to 60%. Left ventricular ejection fraction by 3D volume is 63 %. The left ventricle has normal function. The left ventricle has no regional wall motion abnormalities.  2. Right ventricular systolic function is normal. The right ventricular size is normal. There is normal pulmonary artery systolic pressure. The estimated right ventricular systolic pressure is 10.9 mmHg.  3. Left atrial size was mildly dilated.  4. The mitral valve is normal in structure. No evidence of mitral valve regurgitation.  5. The aortic valve is tricuspid. There is mild calcification of the aortic valve. There is mild thickening of the aortic valve. Aortic valve regurgitation is not visualized. Aortic valve sclerosis/calcification is present, without any evidence of aortic stenosis.  6. The inferior vena cava is collapsed, consistent with low right atrial pressure. Conclusion(s)/Recommendation(s): Echo findings suggest hypovolemia. FINDINGS  Left Ventricle: Impaired relaxation pattern may be due to hypovolemia (diastolic mitral annulus velocities are normal for age). Left ventricular ejection fraction, by estimation, is 55 to 60%. Left ventricular ejection fraction by 3D volume is 63 %. The  left ventricle has normal function. The left ventricle has no regional wall motion abnormalities. The left ventricular internal cavity size was normal in size. There is no left ventricular hypertrophy. Right Ventricle: The right ventricular size is normal. No increase in right ventricular wall thickness. Right ventricular  systolic function is normal. There is normal pulmonary artery systolic pressure. The tricuspid regurgitant velocity is 2.59 m/s, and  with an assumed right atrial pressure of 0 mmHg, the estimated right ventricular systolic pressure is 32.3 mmHg. Left Atrium: Left atrial size was mildly dilated. Right Atrium: Right atrial size was normal in size. Pericardium: There is no evidence of pericardial effusion. Mitral Valve: The mitral valve is normal in structure. No evidence of mitral valve regurgitation. Tricuspid Valve: The tricuspid valve is normal in structure. Tricuspid valve regurgitation is trivial. Aortic Valve: The aortic valve is tricuspid. There is mild calcification of the aortic valve. There is mild thickening of the aortic valve. Aortic valve regurgitation is not visualized. Aortic valve sclerosis/calcification is present, without any evidence of aortic stenosis. Aortic valve mean gradient measures 10.3 mmHg. Aortic valve peak gradient measures 18.6 mmHg. Aortic valve area, by VTI measures 1.91 cm. Pulmonic Valve: The pulmonic valve was not well visualized. Pulmonic valve regurgitation is not visualized. Aorta: The aortic root and ascending aorta are structurally normal, with no evidence of dilitation. Venous: The inferior vena cava is collapsed, consistent with low right atrial pressure. IAS/Shunts: The interatrial septum was not well visualized.  LEFT VENTRICLE PLAX 2D LVIDd:         4.30 cm   Diastology LVIDs:         3.10 cm   LV e' medial:    7.29 cm/s LV PW:         1.00 cm   LV E/e' medial:  10.8 LV IVS:        1.10 cm   LV e' lateral:   10.70 cm/s LVOT diam:     2.00 cm   LV E/e' lateral: 7.4 LV SV:         62 LV SV Index:   41 LVOT Area:     3.14 cm                           3D Volume EF:  3D EF:        63 %                          LV EDV:       103 ml                          LV ESV:       38 ml                          LV SV:        65 ml RIGHT VENTRICLE RV Basal diam:   2.60 cm RV S prime:     11.40 cm/s TAPSE (M-mode): 2.6 cm LEFT ATRIUM           Index        RIGHT ATRIUM           Index LA diam:      2.90 cm 1.92 cm/m   RA Area:     14.30 cm LA Vol (A2C): 38.7 ml 25.63 ml/m  RA Volume:   30.20 ml  20.00 ml/m LA Vol (A4C): 52.0 ml 34.44 ml/m  AORTIC VALVE AV Area (Vmax):    2.01 cm AV Area (Vmean):   1.91 cm AV Area (VTI):     1.91 cm AV Vmax:           215.67 cm/s AV Vmean:          150.000 cm/s AV VTI:            0.324 m AV Peak Grad:      18.6 mmHg AV Mean Grad:      10.3 mmHg LVOT Vmax:         138.00 cm/s LVOT Vmean:        91.200 cm/s LVOT VTI:          0.197 m LVOT/AV VTI ratio: 0.61  AORTA Ao Root diam: 3.00 cm Ao Asc diam:  2.70 cm MITRAL VALVE               TRICUSPID VALVE MV Area (PHT): 4.71 cm    TR Peak grad:   26.8 mmHg MV Decel Time: 161 msec    TR Vmax:        259.00 cm/s MV E velocity: 78.80 cm/s MV A velocity: 98.10 cm/s  SHUNTS MV E/A ratio:  0.80        Systemic VTI:  0.20 m                            Systemic Diam: 2.00 cm Dani Gobble Croitoru MD Electronically signed by Sanda Klein MD Signature Date/Time: 04/06/2022/1:20:24 PM    Final        LOS: 4 days   Bonnielee Haff  [facility] Pager on [email or web address]  04/07/2022, 10:39 AM

## 2022-04-08 ENCOUNTER — Inpatient Hospital Stay (HOSPITAL_COMMUNITY): Payer: 59

## 2022-04-08 DIAGNOSIS — K529 Noninfective gastroenteritis and colitis, unspecified: Secondary | ICD-10-CM | POA: Diagnosis not present

## 2022-04-08 DIAGNOSIS — K7460 Unspecified cirrhosis of liver: Secondary | ICD-10-CM

## 2022-04-08 DIAGNOSIS — R7881 Bacteremia: Secondary | ICD-10-CM | POA: Diagnosis not present

## 2022-04-08 DIAGNOSIS — A4151 Sepsis due to Escherichia coli [E. coli]: Secondary | ICD-10-CM | POA: Diagnosis not present

## 2022-04-08 DIAGNOSIS — N739 Female pelvic inflammatory disease, unspecified: Secondary | ICD-10-CM | POA: Diagnosis not present

## 2022-04-08 DIAGNOSIS — R338 Other retention of urine: Secondary | ICD-10-CM | POA: Diagnosis not present

## 2022-04-08 DIAGNOSIS — B9620 Unspecified Escherichia coli [E. coli] as the cause of diseases classified elsewhere: Secondary | ICD-10-CM | POA: Diagnosis not present

## 2022-04-08 DIAGNOSIS — G9340 Encephalopathy, unspecified: Secondary | ICD-10-CM | POA: Diagnosis not present

## 2022-04-08 LAB — PROTIME-INR
INR: 2.8 — ABNORMAL HIGH (ref 0.8–1.2)
Prothrombin Time: 29.1 seconds — ABNORMAL HIGH (ref 11.4–15.2)

## 2022-04-08 LAB — CBC WITH DIFFERENTIAL/PLATELET
Abs Immature Granulocytes: 0.23 10*3/uL — ABNORMAL HIGH (ref 0.00–0.07)
Basophils Absolute: 0.1 10*3/uL (ref 0.0–0.1)
Basophils Relative: 1 %
Eosinophils Absolute: 0.1 10*3/uL (ref 0.0–0.5)
Eosinophils Relative: 0 %
HCT: 34.1 % — ABNORMAL LOW (ref 36.0–46.0)
Hemoglobin: 11.7 g/dL — ABNORMAL LOW (ref 12.0–15.0)
Immature Granulocytes: 1 %
Lymphocytes Relative: 11 %
Lymphs Abs: 1.8 10*3/uL (ref 0.7–4.0)
MCH: 34.1 pg — ABNORMAL HIGH (ref 26.0–34.0)
MCHC: 34.3 g/dL (ref 30.0–36.0)
MCV: 99.4 fL (ref 80.0–100.0)
Monocytes Absolute: 0.5 10*3/uL (ref 0.1–1.0)
Monocytes Relative: 3 %
Neutro Abs: 14.1 10*3/uL — ABNORMAL HIGH (ref 1.7–7.7)
Neutrophils Relative %: 84 %
Platelets: 92 10*3/uL — ABNORMAL LOW (ref 150–400)
RBC: 3.43 MIL/uL — ABNORMAL LOW (ref 3.87–5.11)
RDW: 24.1 % — ABNORMAL HIGH (ref 11.5–15.5)
WBC: 16.8 10*3/uL — ABNORMAL HIGH (ref 4.0–10.5)
nRBC: 0 % (ref 0.0–0.2)

## 2022-04-08 LAB — COMPREHENSIVE METABOLIC PANEL
ALT: 33 U/L (ref 0–44)
AST: 54 U/L — ABNORMAL HIGH (ref 15–41)
Albumin: 2 g/dL — ABNORMAL LOW (ref 3.5–5.0)
Alkaline Phosphatase: 95 U/L (ref 38–126)
Anion gap: 6 (ref 5–15)
BUN: 13 mg/dL (ref 6–20)
CO2: 20 mmol/L — ABNORMAL LOW (ref 22–32)
Calcium: 7.9 mg/dL — ABNORMAL LOW (ref 8.9–10.3)
Chloride: 107 mmol/L (ref 98–111)
Creatinine, Ser: 0.56 mg/dL (ref 0.44–1.00)
GFR, Estimated: >60 mL/min (ref >60)
Glucose, Bld: 96 mg/dL (ref 70–99)
Potassium: 3.6 mmol/L (ref 3.5–5.1)
Sodium: 133 mmol/L — ABNORMAL LOW (ref 135–145)
Total Bilirubin: 2.4 mg/dL — ABNORMAL HIGH (ref 0.3–1.2)
Total Protein: 4.7 g/dL — ABNORMAL LOW (ref 6.5–8.1)

## 2022-04-08 LAB — URINALYSIS, ROUTINE W REFLEX MICROSCOPIC
Bacteria, UA: NONE SEEN
Bilirubin Urine: NEGATIVE
Glucose, UA: NEGATIVE mg/dL
Ketones, ur: NEGATIVE mg/dL
Leukocytes,Ua: NEGATIVE
Nitrite: NEGATIVE
Protein, ur: NEGATIVE mg/dL
Specific Gravity, Urine: 1.042 — ABNORMAL HIGH (ref 1.005–1.030)
pH: 6 (ref 5.0–8.0)

## 2022-04-08 LAB — GLUCOSE, CAPILLARY: Glucose-Capillary: 80 mg/dL (ref 70–99)

## 2022-04-08 LAB — LACTIC ACID, PLASMA
Lactic Acid, Venous: 2 mmol/L (ref 0.5–1.9)
Lactic Acid, Venous: 1.9 mmol/L (ref 0.5–1.9)

## 2022-04-08 LAB — PROCALCITONIN: Procalcitonin: 7.63 ng/mL

## 2022-04-08 MED ORDER — IOHEXOL 9 MG/ML PO SOLN
ORAL | Status: AC
  Filled 2022-04-08: qty 1000

## 2022-04-08 MED ORDER — DEXTROSE 5 % IV SOLN
2.0000 mg | Freq: Once | INTRAVENOUS | Status: AC
  Administered 2022-04-08: 2 mg via INTRAVENOUS
  Filled 2022-04-08: qty 0.2

## 2022-04-08 MED ORDER — METRONIDAZOLE 500 MG/100ML IV SOLN
500.0000 mg | Freq: Two times a day (BID) | INTRAVENOUS | Status: DC
  Administered 2022-04-08 – 2022-04-14 (×12): 500 mg via INTRAVENOUS
  Filled 2022-04-08 (×12): qty 100

## 2022-04-08 MED ORDER — SODIUM CHLORIDE 0.9 % IV BOLUS
500.0000 mL | Freq: Once | INTRAVENOUS | Status: AC
  Administered 2022-04-08: 500 mL via INTRAVENOUS

## 2022-04-08 MED ORDER — ACETAMINOPHEN 325 MG PO TABS
650.0000 mg | ORAL_TABLET | Freq: Once | ORAL | Status: AC
Start: 2022-04-08 — End: 2022-04-08
  Administered 2022-04-08: 650 mg via ORAL
  Filled 2022-04-08: qty 2

## 2022-04-08 MED ORDER — SODIUM CHLORIDE 0.9 % IV SOLN: INTRAVENOUS | Status: DC

## 2022-04-08 MED ORDER — IOHEXOL 9 MG/ML PO SOLN
500.0000 mL | ORAL | Status: AC
  Administered 2022-04-08 (×2): 500 mL via ORAL

## 2022-04-08 MED ORDER — CHLORHEXIDINE GLUCONATE CLOTH 2 % EX PADS
6.0000 | MEDICATED_PAD | Freq: Every day | CUTANEOUS | Status: DC
  Administered 2022-04-09 – 2022-04-24 (×10): 6 via TOPICAL

## 2022-04-08 MED ORDER — PIPERACILLIN-TAZOBACTAM 3.375 G IVPB: 3.3750 g | Freq: Three times a day (TID) | INTRAVENOUS | Status: DC

## 2022-04-08 MED ORDER — SODIUM CHLORIDE 0.9 % IV SOLN
2.0000 g | Freq: Two times a day (BID) | INTRAVENOUS | Status: DC
  Administered 2022-04-08 – 2022-04-14 (×12): 2 g via INTRAVENOUS
  Filled 2022-04-08 (×12): qty 12.5

## 2022-04-08 MED ORDER — IOHEXOL 300 MG/ML  SOLN
100.0000 mL | Freq: Once | INTRAMUSCULAR | Status: AC | PRN
  Administered 2022-04-08: 100 mL via INTRAVENOUS

## 2022-04-08 NOTE — Progress Notes (Addendum)
[facility]  Date of Admission:  04/03/2022           Reason for visit: Follow up on bacteremia  Current antibiotics: Cefepime  ASSESSMENT:    61 y.o. female admitted with:  Colitis and secondary Citrobacter and E. coli bacteremia: Complicated now with CT evidence of gas and fluid in the left pelvis consistent with perforation of the rectosigmoid colon with contained abscess formation and peritonitis. Metastatic colorectal cancer: Currently on chemotherapy with last dose approximately 1 week ago. Urinary retention and mild hydro ureteronephrosis on the left: Presumably related to obstruction from left pelvic abscess. Sepsis: Secondary to #1. Positive HCV Ab: Noted in February 2023.  RECOMMENDATIONS:    Continue cefepime Add metronidazole in the setting of needed anaerobic coverage Follow-up repeat blood cultures Appreciate surgery evaluation.  Planning for possible IR drainage of abscess Citrobacter braakii has potential for inducible amp C resistance to develop and, as such, cefepime is the preferred antibiotic choice in her situation Check HCV RNA Will follow   Principal Problem:   E coli bacteremia Active Problems:   Malignant neoplasm of rectosigmoid junction (HCC)   Acute encephalopathy   Aortic mural thrombus (HCC)   Thrombocytopenia (HCC)   Protein-calorie malnutrition, severe   Hepatitis C   Sepsis ([place])   Normocytic anemia   Elevated troponin   Acute urinary retention   Malnutrition of moderate degree   Pelvic abscess in female    MEDICATIONS:    Scheduled Meds:  feeding supplement  1 Container Oral Q24H   feeding supplement  237 mL Oral BID BM   iohexol       multivitamin with minerals  1 tablet Oral Daily   Continuous Infusions:  sodium chloride 100 mL/hr at 04/08/22 0905   ceFEPime (MAXIPIME) IV     metronidazole     PRN Meds:.acetaminophen **OR** acetaminophen, iohexol, oxyCODONE,  prochlorperazine  SUBJECTIVE:   24 hour events:  Leukocytosis more or less stable Elevated temperatures 103.3 this morning Foley catheter removed.  Bladder scan shows 120 cc Blood cultures are also growing Citrobacter as well as E. coli Repeat blood cultures obtained CT was ordered today given her fevers which showed gas and fluid collection in the deep left pelvis most consistent with perforation of the rectosigmoid colon with contained abscess as well as a large volume intraperitoneal free fluid and evidence of peritonitis New hydronephrosis also seen in the left likely related to obstruction from the pelvic abscess Seen by general surgery  Feels okay this afternoon.  Had abdominal pain reported to others earlier.  States she may need a Foley catheter again.  She said Dr. Johney Maine had just been in the room.  Review of Systems  All other systems reviewed and are negative.     OBJECTIVE:   Blood pressure 106/70, pulse 99, temperature 97.6 F (36.4 C), temperature source Axillary, resp. rate 16, height 5' 4"  (1.626 m), weight 49.3 kg, SpO2 99 %. Body mass index is 18.66 kg/m.  Physical Exam Constitutional:      Appearance: Normal appearance.  HENT:     Head: Normocephalic and atraumatic.  Eyes:     Extraocular Movements: Extraocular movements intact.     Conjunctiva/sclera: Conjunctivae normal.  Pulmonary:     Effort: Pulmonary effort is normal. No respiratory distress.  Abdominal:     General: There is distension.     Palpations: Abdomen is soft.     Tenderness: There is no abdominal tenderness.  Musculoskeletal:        General: Normal range of motion.     Cervical back: Normal range of motion and neck supple.  Skin:    General: Skin is warm and dry.  Neurological:     General: No focal deficit present.     Mental Status: She is alert and oriented to person, place, and time.  Psychiatric:        Mood and Affect: Mood normal.        Behavior: Behavior normal.       Lab Results: Lab Results  Component Value Date   WBC 16.8 (H) 04/08/2022   HGB 11.7 (L) 04/08/2022   HCT 34.1 (L) 04/08/2022   MCV 99.4 04/08/2022   PLT 92 (L) 04/08/2022    Lab Results  Component Value Date   NA 133 (L) 04/08/2022   K 3.6 04/08/2022   CO2 20 (L) 04/08/2022   GLUCOSE 96 04/08/2022   BUN 13 04/08/2022   CREATININE 0.56 04/08/2022   CALCIUM 7.9 (L) 04/08/2022   GFRNONAA >60 04/08/2022   GFRAA  06/26/2010    >60        The eGFR has been calculated using the MDRD equation. This calculation has not been validated in all clinical situations. eGFR's persistently <60 mL/min signify possible Chronic Kidney Disease.    Lab Results  Component Value Date   ALT 33 04/08/2022   AST 54 (H) 04/08/2022   ALKPHOS 95 04/08/2022   BILITOT 2.4 (H) 04/08/2022    No results found for: "CRP"  No results found for: "ESRSEDRATE"   I have reviewed the micro and lab results in Epic.  Imaging: CT ABDOMEN PELVIS W CONTRAST  Result Date: 04/08/2022 CLINICAL DATA:  Sepsis, persistent fever, abdominal pain. * Tracking Code: BO * EXAM: CT ABDOMEN AND PELVIS WITH CONTRAST TECHNIQUE: Multidetector CT imaging of the abdomen and pelvis was performed using the standard protocol following bolus administration of intravenous contrast. RADIATION DOSE REDUCTION: This exam was performed according to the departmental dose-optimization program which includes automated exposure control, adjustment of the mA and/or kV according to patient size and/or use of iterative reconstruction technique. CONTRAST:  149m OMNIPAQUE IOHEXOL 300 MG/ML  SOLN COMPARISON:  None Available. FINDINGS: Lower chest: Bibasilar atelectasis small effusions. Hepatobiliary: No focal hepatic lesion. New large volume of fluid surrounds the margin of the liver extends along the RIGHT pericolic gutter. Gallbladder distended without evidence inflammation. No biliary duct dilatation. Portal veins are patent. Pancreas:  Pancreas is normal. No ductal dilatation. No pancreatic inflammation. Spleen: Normal spleen Adrenals/urinary tract: Adrenal glands normal. There is mild hydronephrosis hydroureter on the LEFT. Obstructive LEFT ureter likely related to abscess formation in the LEFT pelvis. Stomach/Bowel: Stomach proximal small bowel normal. Contrast flows proximal half way through the small bowel. There is multiple air-fluid levels within the small bowel without significant dilatation. The ascending and transverse colon gas-filled relatively normal. No evidence of colitis seen on comparison exam. Within the LEFT lower quadrant just superior to the operator space is a contained fluid collection which has fluid and gas measuring 4.5 x 6.7 cm (image 82/2). Favor contained perforation of the bowel at rectosigmoid level small wiht extraluminal gas noted on image 79/2. No significant intraperitoneal free air. There is a large volume intraperitoneal free fluid. Margin of the free fluid is enhancing in the LEFT pericolic gutter (image 562/1 consistent peritonitis. Again demonstrated a mass at the rectosigmoid junction. Vascular/Lymphatic: Abdominal aorta is normal caliber with intimal calcification. Lymph node  deep to the operator space again noted on the LEFT measuring 3.1 x 2.6 cm Reproductive: Grossly unremarkable Other: None Musculoskeletal: No aggressive osseous lesion. IMPRESSION: 1. Gas and fluid collection in the deep LEFT pelvis most consistent with perforation of the rectosigmoid colon with contained abscess formation. 2. New large volume intraperitoneal free fluid. Evidence of peritonitis associated the free fluid. 3. Small bowel ileus. 4. No large volume intraperitoneal free air. 5. New mild hydro nephrosis and hydroureter on the LEFT presumed related to obstruction of LEFT ureter by the LEFT pelvic abscess. [facility] on 04/08/2022  at13:59. Electronically Signed   By: Suzy Bouchard M.D.   On:  04/08/2022 14:01   DG Abd 1 View  Result Date: 04/08/2022 CLINICAL DATA:  61 year old female history of abdominal distension. EXAM: ABDOMEN - 1 VIEW COMPARISON:  No prior abdominal radiograph. CT of the abdomen and pelvis 04/03/2022. FINDINGS: Diffuse gaseous distension is noted throughout the small bowel and colon. Small bowel loops measure up to 4.1 cm in the central abdomen. Rectal gas is noted. No definite pneumoperitoneum confidently identified. IMPRESSION: Abnormal bowel-gas pattern, most suggestive of an ileus, as above. Electronically Signed   By: Vinnie Langton M.D.   On: 04/08/2022 07:07     Imaging independently reviewed in Epic.    [facility] [phone] pager 04/08/2022, 3:13 PM

## 2022-04-08 NOTE — Progress Notes (Signed)
   04/08/22 1015  Provider Notification  Provider Name/Title Bonnielee Haff, MD  Date Provider Notified 04/08/22  Time Provider Notified 1015  Method of Notification Page  Notification Reason Critical result  Test performed and critical result Lactic Acid 2.0  Date Critical Result Received 04/08/22  Time Critical Result Received 2111

## 2022-04-08 NOTE — TOC Initial Note (Addendum)
Transition of Care [facility]) - Initial/Assessment Note    Patient Details  Name: Angela Mcpherson MRN: [id number] Date of Birth: Apr 24, 1961  [facility] CM/SW Contact:    Roseanne [facility], RN Phone Number: 04/08/2022, 3:25 PM  Clinical Narrative:   Unable to speak with husband will call him back.  TOC will continue to follow.                - 4:00 pm attempted to speak with patient's husband, he request a call back.   -4:10 pm spoke with patient's husband to introduce myself and advise of services that [facility] can assist with.   TOC will continue to follow for needs.  Expected Discharge Plan: Home/Self Care Barriers to Discharge: Continued Medical Work up   Patient Goals and CMS Choice        Expected Discharge Plan and Services Expected Discharge Plan: Home/Self Care   Discharge Planning Services: CM Consult   Living arrangements for the past 2 months: Single Family Home                                      Prior Living Arrangements/Services Living arrangements for the past 2 months: Single Family Home Lives with:: Spouse                   Activities of Daily Living Home Assistive Devices/Equipment: None ADL Screening (condition at time of admission) Patient's cognitive ability adequate to safely complete daily activities?: No Is the patient deaf or have difficulty hearing?: No Does the patient have difficulty seeing, even when wearing glasses/contacts?: No Does the patient have difficulty concentrating, remembering, or making decisions?: Yes Patient able to express need for assistance with ADLs?: Yes Does the patient have difficulty dressing or bathing?: Yes Independently performs ADLs?: No Communication: Independent Dressing (OT): Needs assistance Is this a change from baseline?: Change from baseline, expected to last >3 days Grooming: Needs assistance Is this a change from baseline?: Change from baseline, expected to last >3 days Feeding:  Needs assistance Is this a change from baseline?: Change from baseline, expected to last >3 days Bathing: Needs assistance Is this a change from baseline?: Change from baseline, expected to last >3 days Toileting: Needs assistance Is this a change from baseline?: Change from baseline, expected to last >3days In/Out Bed: Needs assistance Is this a change from baseline?: Change from baseline, expected to last >3 days Walks in Home: Needs assistance Is this a change from baseline?: Change from baseline, expected to last >3 days Does the patient have difficulty walking or climbing stairs?: Yes Weakness of Legs: Both Weakness of Arms/Hands: Both  Permission Sought/Granted                  Emotional Assessment              Admission diagnosis:  Colitis [K52.9] Weakness [R53.1] Sepsis ([place]) [A41.9] Hypotension, unspecified hypotension type [I95.9] Diarrhea, unspecified type [R19.7] Patient Active Problem List   Diagnosis Date Noted   Pelvic abscess in female 04/08/2022   Hepatic cirrhosis ([place]) 04/08/2022   E coli bacteremia 04/07/2022   Malnutrition of moderate degree 04/04/2022   Sepsis ([place]) 04/03/2022   Normocytic anemia 04/03/2022   Elevated troponin 04/03/2022   Acute urinary retention 04/03/2022   Hepatitis C 10/07/2021   Gastroenteritis due to norovirus 05/21/2021   Protein-calorie malnutrition, severe 05/20/2021   Colitis 05/18/2021  Acute encephalopathy 05/18/2021   Dehydration 05/18/2021   Aortic mural thrombus ([place]) 05/18/2021   Thrombocytopenia ([place]) 05/18/2021   Genetic testing 04/02/2021   Family history of stomach cancer 03/04/2021   Family history of brain cancer 03/04/2021   Family history of prostate cancer 03/04/2021   Malignant neoplasm of rectosigmoid junction ([place]) 02/12/2021   PCP:  Default, Provider, MD Pharmacy:   [id number] [place] [place] [place] [address]Phone: [phone]: [phone] [facility] [place] [address]Phone: [phone]: [phone]    Social Determinants of Health (SDOH) Interventions    Readmission Risk Interventions     No data to display

## 2022-04-08 NOTE — Progress Notes (Addendum)
[facility] PROGRESS NOTE   Angela Mcpherson YPP:[id number] DOB: 02/08/1961 DOA: 04/03/2022  PCP: Default, Provider, MD  Brief History/Interval Summary: 61 y.o. female with medical history significant for aortic mural thrombus, rectal cancer.  Presented with abdominal pain and confusion.  She had chemotherapy 2 days prior to admission.  Was noted to be lethargic and has been having loose stools at home.  Evaluation in the ED raised concern for colitis.  Patient was hospitalized for further management.    Consultants: Medical oncologist, Dr. Burr Medico.  Infectious disease  Procedures: None    Subjective/Interval History: Overnight events noted.  Patient appears to be slightly confused this morning.  Noted to be febrile.  Complains of abdominal pain.     Assessment/Plan:  Acute colitis/Sepsis, present on admission/E. coli and Citrobacter bacteremia Lactic acid level was noted to be significantly elevated.  Patient was aggressively hydrated.  Blood pressure has improved.  Cortisol level was noted to be normal.  Lactic acid levels improved.  Significantly elevated procalcitonin level was noted likely due to bacteremia. C. difficile PCR is negative.  GI pathogen panel is negative. Blood culture positive for E. Coli and citrobacter.   Initially placed on cefepime and metronidazole.  Changed over to ceftriaxone.  Changed back to cefepime by infectious disease on 8/21. Overnight she has developed high fever.  WBC continues to climb.  Noted to be more tender on examination.  Abdominal film suggests ileus.  Proceed with CT scan of the abdomen and pelvis. Repeat blood cultures and will check lactic acid level.  IV fluid bolus has been ordered along with maintenance fluids.  ADDENDUM CT scan shows evidence for left-sided abscess around the site of the cancer.  Might have had a contained perforation.  There is also evidence for peritonitis, mild ileus and left-sided hydronephrosis.  We will  reinsert Foley catheter.  We will make her n.p.o.  Stop the Eliquis.  We will consult general surgery and will likely need to consult interventional radiology for drainage.  Notified infectious disease.  Will notify medical oncology as well.  History of left-sided colon Cancer Followed by medical oncology.  Has been on chemotherapy.  Last infusion was on August 7.  Acute metabolic encephalopathy Neuropathy is most likely due to sepsis.  No focal neurological deficits on examination.  Urinary retention Foley catheter had to be placed.  Continue to monitor. Foley catheter was removed yesterday.  Required in and out catheterization overnight.  Bladder scans every shift for now. UA reviewed.  Urine culture with 20,000 colonies of Klebsiella oxytoca. UA to be repeated considering fever.  Mildly elevated troponin levels Most likely demand ischemia.  Patient denies any chest pain.  No ischemic changes noted on EKG. Echocardiogram shows normal left ventricular systolic function without any wall motion abnormalities.  No further work-up at this time.  Hyperbilirubinemia Elevated bilirubin noted today.  AST noted to be mildly elevated as well.  Follow-up CT scan.  Devious CT scan did not show any abnormalities in the hepatobiliary system.    History of aortic mural thrombus This was originally diagnosed in October 2022.  She was found to have noncalcified thrombus in the infrarenal abdominal aorta causing severe stenosis at that time.  Subsequent CT scan done and January 2023 showed significant interval decrease in the thrombus. Since there has been a decrease in thrombus size and no mention of the thrombus on subsequent imaging studies anticoagulation can be briefly held if needed. Eliquis was held for 2 days.  Resumed  on 8/21.  Hypokalemia/hypomagnesemia Required aggressive potassium supplementation with improvement in levels.  Magnesium 2.0 on 8/21.    Normocytic  anemia/thrombocytopenia Etiology unclear.  No overt blood loss noted though her stool for occult blood was positive.  Check anemia panel. She has had thrombocytopenia for several months now.  Did drop to 39,000 on 8/18.  Seems to be improving.  Eliquis was resumed yesterday.     DVT Prophylaxis: Eliquis  Code Status: Full code Family Communication: Discussed with patient.  No family at bedside.  Disposition Plan: Hopefully return home when improved.   Status is: Inpatient Remains inpatient appropriate because: Sepsis, bacteremia, colitis      Medications: Scheduled:  apixaban  5 mg Oral BID   feeding supplement  1 Container Oral Q24H   feeding supplement  237 mL Oral BID BM   iohexol  500 mL Oral Q1H   iohexol       multivitamin with minerals  1 tablet Oral Daily   Continuous:  sodium chloride 100 mL/hr at 04/08/22 0905   ceFEPime (MAXIPIME) IV 2 g (04/08/22 0955)   WER:[id number] **OR** acetaminophen, iohexol, loperamide, oxyCODONE, prochlorperazine  Antibiotics: Anti-infectives (From admission, onward)    Start     Dose/Rate Route Frequency Ordered Stop   04/07/22 2000  ceFEPIme (MAXIPIME) 2 g in sodium chloride 0.9 % 100 mL IVPB        2 g 200 mL/hr over 30 Minutes Intravenous Every 12 hours 04/07/22 1600     04/04/22 0900  cefTRIAXone (ROCEPHIN) 2 g in sodium chloride 0.9 % 100 mL IVPB  Status:  Discontinued        2 g 200 mL/hr over 30 Minutes Intravenous Every 24 hours 04/04/22 0257 04/07/22 1600   04/03/22 1700  ceFEPIme (MAXIPIME) 2 g in sodium chloride 0.9 % 100 mL IVPB  Status:  Discontinued        2 g 200 mL/hr over 30 Minutes Intravenous Every 8 hours 04/03/22 1634 04/04/22 0257   04/03/22 1500  metroNIDAZOLE (FLAGYL) IVPB 500 mg  Status:  Discontinued        500 mg 100 mL/hr over 60 Minutes Intravenous Every 12 hours 04/03/22 1446 04/04/22 0257   04/03/22 1045  ceFEPIme (MAXIPIME) 2 g in sodium chloride 0.9 % 100 mL IVPB        2 g 200 mL/hr over 30  Minutes Intravenous  Once 04/03/22 1031 04/03/22 1429   04/03/22 1045  vancomycin (VANCOCIN) IVPB 1000 mg/200 mL premix        1,000 mg 200 mL/hr over 60 Minutes Intravenous  Once 04/03/22 1031 04/03/22 1241       Objective:  Vital Signs  Vitals:   04/08/22 0600 04/08/22 0742 04/08/22 0853 04/08/22 1003  BP: 1'11/73 99/62 94/64 '$ 100/66  Pulse: (!) 123 (!) 121 (!) 112 (!) 116  Resp: '20 16 19 16  '$ Temp: (!) 103.3 F (39.6 C) (!) 101.5 F (38.6 C) 99.4 F (37.4 C) 98.3 F (36.8 C)  TempSrc: Rectal Rectal Rectal Rectal  SpO2: 98% 95% 96% 97%  Weight:      Height:        Intake/Output Summary (Last 24 hours) at 04/08/2022 1005 Last data filed at 04/08/2022 0700 Gross per 24 hour  Intake 930 ml  Output 755 ml  Net 175 ml    Filed Weights   04/04/22 0847  Weight: 49.3 kg    General appearance: Awake alert.  In no distress.  Mildly confused and distracted Resp:  Clear to auscultation bilaterally.  Normal effort Cardio: S1-S2 is normal regular.  No S3-S4.  No rubs murmurs or bruit GI: Abdomen is soft.  Seems to be slightly distended today.  Tender in the mid abdomen without any rebound rigidity or guarding. bowel sounds sluggish. Extremities: No edema.  Full range of motion of lower extremities. Neurologic: She is alert.  Oriented to place but not to year.  Somewhat distracted.  No obvious focal neurological deficits noted on examination.        Lab Results:  Data Reviewed: I have personally reviewed following labs and reports of the imaging studies  CBC: Recent Labs  Lab 04/03/22 1035 04/04/22 0336 04/05/22 0502 04/06/22 0352 04/07/22 0332 04/08/22 0419  WBC 13.3* 6.9 8.1 12.0* 16.6* 16.8*  NEUTROABS 11.5*  --  6.7 9.3* 13.6* 14.1*  HGB 11.5* 9.5* 11.5* 10.0* 10.5* 11.7*  HCT 33.8* 28.2* 32.7* 28.9* 30.5* 34.1*  MCV 99.1 99.3 97.3 98.3 99.0 99.4  PLT 72* 39* 56* 46* 60* 92*     Basic Metabolic Panel: Recent Labs  Lab 04/04/22 0336 04/04/22 0744  04/05/22 0502 04/06/22 0352 04/07/22 0332 04/08/22 0419  NA 140  --  137 136 136 133*  K 3.4*  --  2.9* 3.8 3.8 3.6  CL 115*  --  109 111 112* 107  CO2 21*  --  23 22 20* 20*  GLUCOSE 130*  --  167* 116* 120* 96  BUN 32*  --  '19 17 15 13  '$ CREATININE 0.67  --  0.76 0.70 0.63 0.56  CALCIUM 7.6*  --  8.4* 7.9* 7.7* 7.9*  MG  --  2.1  --  1.6* 2.0  --      GFR: Estimated Creatinine Clearance: 58.2 mL/min (by C-G formula based on SCr of 0.56 mg/dL).  Liver Function Tests: Recent Labs  Lab 04/04/22 0336 04/05/22 0502 04/05/22 1016 04/06/22 0352 04/07/22 0332 04/08/22 0419  AST 23 32  --  32 42* 54*  ALT 15 19  --  22 28 33  ALKPHOS 67 91  --  72 81 95  BILITOT 2.0* 1.6* 1.6* 1.8* 1.9* 2.4*  PROT 4.3* 5.1*  --  4.5* 4.6* 4.7*  ALBUMIN 2.1* 2.4*  --  2.1* 2.1* 2.0*     Recent Labs  Lab 04/03/22 1126  LIPASE 44    Recent Labs  Lab 04/03/22 1426  AMMONIA 34     Coagulation Profile: Recent Labs  Lab 04/03/22 1035 04/04/22 0336  INR 3.0* 4.0*      Recent Results (from the past 240 hour(s))  Culture, blood (routine x 2)     Status: Abnormal (Preliminary result)   Collection Time: 04/03/22 10:40 AM   Specimen: BLOOD  Result Value Ref Range Status   Specimen Description   Final    BLOOD RIGHT ANTECUBITAL Performed at [facility], [place] [address], [place], [place] [address]    Special Requests   Final    BOTTLES DRAWN AEROBIC AND ANAEROBIC Blood Culture results may not be optimal due to an inadequate volume of blood received in culture bottles Performed at [place] [address], [place], [place] [address]    Culture  Setup Time   Final    GRAM NEGATIVE RODS IN BOTH AEROBIC AND ANAEROBIC BOTTLES CRITICAL VALUE NOTED.  VALUE IS CONSISTENT WITH PREVIOUSLY REPORTED AND CALLED VALUE.    Culture (A)  Final    CITROBACTER BRAAKII CULTURE REINCUBATED FOR BETTER GROWTH  Performed at [facility], [place]  [address] [place], [place] [address]    Report Status PENDING  Incomplete   Organism ID, Bacteria CITROBACTER BRAAKII  Final      Susceptibility   Citrobacter braakii - MIC*    CEFAZOLIN RESISTANT Resistant     CEFEPIME <=0.12 SENSITIVE Sensitive     CEFTAZIDIME <=1 SENSITIVE Sensitive     CEFTRIAXONE <=0.25 SENSITIVE Sensitive     CIPROFLOXACIN <=0.25 SENSITIVE Sensitive     GENTAMICIN <=1 SENSITIVE Sensitive     IMIPENEM <=0.25 SENSITIVE Sensitive     TRIMETH/SULFA <=20 SENSITIVE Sensitive     PIP/TAZO <=4 SENSITIVE Sensitive     * CITROBACTER BRAAKII  Culture, blood (routine x 2)     Status: Abnormal   Collection Time: 04/03/22 10:40 AM   Specimen: BLOOD  Result Value Ref Range Status   Specimen Description   Final    BLOOD LEFT ANTECUBITAL Performed at [facility] [address] [place], [place] [address]    Special Requests   Final    BOTTLES DRAWN AEROBIC AND ANAEROBIC Blood Culture adequate volume Performed at [facility] [address], [place], [place] [address]    Culture  Setup Time   Final    GRAM NEGATIVE RODS IN BOTH AEROBIC AND ANAEROBIC BOTTLES CRITICAL RESULT CALLED TO, READ BACK BY AND VERIFIED WITH: M LILLISTON,PHARMD'@0138'$  04/04/22 [place] Performed at [facility], [address], [place], [place] [address]    Culture ESCHERICHIA COLI (A)  Final   Report Status 04/06/2022 FINAL  Final   Organism ID, Bacteria ESCHERICHIA COLI  Final      Susceptibility   Escherichia coli - MIC*    AMPICILLIN RESISTANT Resistant     CEFAZOLIN 32 INTERMEDIATE Intermediate     CEFEPIME <=0.12 SENSITIVE Sensitive     CEFTAZIDIME <=1 SENSITIVE Sensitive     CEFTRIAXONE <=0.25 SENSITIVE Sensitive     CIPROFLOXACIN <=0.25 SENSITIVE Sensitive     GENTAMICIN <=1 SENSITIVE Sensitive     IMIPENEM <=0.25 SENSITIVE Sensitive     TRIMETH/SULFA <=20 SENSITIVE Sensitive     AMPICILLIN/SULBACTAM <=2 SENSITIVE Sensitive     PIP/TAZO <=4 SENSITIVE  Sensitive     * ESCHERICHIA COLI  Blood Culture ID Panel (Reflexed)     Status: Abnormal   Collection Time: 04/03/22 10:40 AM  Result Value Ref Range Status   Enterococcus faecalis NOT DETECTED NOT DETECTED Final   Enterococcus Faecium NOT DETECTED NOT DETECTED Final   Listeria monocytogenes NOT DETECTED NOT DETECTED Final   Staphylococcus species NOT DETECTED NOT DETECTED Final   Staphylococcus aureus (BCID) NOT DETECTED NOT DETECTED Final   Staphylococcus epidermidis NOT DETECTED NOT DETECTED Final   Staphylococcus lugdunensis NOT DETECTED NOT DETECTED Final   Streptococcus species NOT DETECTED NOT DETECTED Final   Streptococcus agalactiae NOT DETECTED NOT DETECTED Final   Streptococcus pneumoniae NOT DETECTED NOT DETECTED Final   Streptococcus pyogenes NOT DETECTED NOT DETECTED Final   A.calcoaceticus-baumannii NOT DETECTED NOT DETECTED Final   Bacteroides fragilis NOT DETECTED NOT DETECTED Final   Enterobacterales DETECTED (A) NOT DETECTED Final    Comment: Enterobacterales represent a large order of gram negative bacteria, not a single organism. CRITICAL RESULT CALLED TO, READ BACK BY AND VERIFIED WITH: M LILLISTON,PHARMD'@0138'$  04/04/22 [place]    Enterobacter cloacae complex NOT DETECTED NOT DETECTED Final   Escherichia coli DETECTED (A) NOT DETECTED Final    Comment: CRITICAL RESULT CALLED TO, READ BACK  BY AND VERIFIED WITH: M LILLISTON,PHARMD'@0138'$  04/04/22 [place]    Klebsiella aerogenes NOT DETECTED NOT DETECTED Final   Klebsiella oxytoca NOT DETECTED NOT DETECTED Final   Klebsiella pneumoniae NOT DETECTED NOT DETECTED Final   Proteus species NOT DETECTED NOT DETECTED Final   Salmonella species NOT DETECTED NOT DETECTED Final   Serratia marcescens NOT DETECTED NOT DETECTED Final   Haemophilus influenzae NOT DETECTED NOT DETECTED Final   Neisseria meningitidis NOT DETECTED NOT DETECTED Final   Pseudomonas aeruginosa NOT DETECTED NOT DETECTED Final   Stenotrophomonas maltophilia NOT  DETECTED NOT DETECTED Final   Candida albicans NOT DETECTED NOT DETECTED Final   Candida auris NOT DETECTED NOT DETECTED Final   Candida glabrata NOT DETECTED NOT DETECTED Final   Candida krusei NOT DETECTED NOT DETECTED Final   Candida parapsilosis NOT DETECTED NOT DETECTED Final   Candida tropicalis NOT DETECTED NOT DETECTED Final   Cryptococcus neoformans/gattii NOT DETECTED NOT DETECTED Final   CTX-M ESBL NOT DETECTED NOT DETECTED Final   Carbapenem resistance IMP NOT DETECTED NOT DETECTED Final   Carbapenem resistance KPC NOT DETECTED NOT DETECTED Final   Carbapenem resistance NDM NOT DETECTED NOT DETECTED Final   Carbapenem resist OXA 48 LIKE NOT DETECTED NOT DETECTED Final   Carbapenem resistance VIM NOT DETECTED NOT DETECTED Final    Comment: Performed at [facility], [address] [place], [place] [address]  Resp Panel by RT-PCR (Flu A&B, Covid) Anterior Nasal Swab     Status: None   Collection Time: 04/03/22 10:47 AM   Specimen: Anterior Nasal Swab  Result Value Ref Range Status   SARS Coronavirus 2 by RT PCR NEGATIVE NEGATIVE Final    Comment: (NOTE) SARS-CoV-2 target nucleic acids are NOT DETECTED.  The SARS-CoV-2 RNA is generally detectable in upper respiratory specimens during the acute phase of infection. The lowest concentration of SARS-CoV-2 viral copies this assay can detect is 138 copies/mL. A negative result does not preclude SARS-Cov-2 infection and should not be used as the sole basis for treatment or other patient management decisions. A negative result may occur with  improper specimen collection/handling, submission of specimen other than nasopharyngeal swab, presence of viral mutation(s) within the areas targeted by this assay, and inadequate number of viral copies(<138 copies/mL). A negative result must be combined with clinical observations, patient history, and epidemiological information. The expected result is Negative.  Fact Sheet for  Patients:  EntrepreneurPulse.com.au  Fact Sheet for Healthcare Providers:  IncredibleEmployment.be  This test is no t yet approved or cleared by the [place] FDA and  has been authorized for detection and/or diagnosis of SARS-CoV-2 by FDA under an Emergency Use Authorization (EUA). This EUA will remain  in effect (meaning this test can be used) for the duration of the COVID-19 declaration under Section 564(b)(1) of the Act, 21 U.S.C.section 360bbb-3(b)(1), unless the authorization is terminated  or revoked sooner.       Influenza A by PCR NEGATIVE NEGATIVE Final   Influenza B by PCR NEGATIVE NEGATIVE Final    Comment: (NOTE) The Xpert Xpress SARS-CoV-2/FLU/RSV plus assay is intended as an aid in the diagnosis of influenza from Nasopharyngeal swab specimens and should not be used as a sole basis for treatment. Nasal washings and aspirates are unacceptable for Xpert Xpress SARS-CoV-2/FLU/RSV testing.  Fact Sheet for Patients: EntrepreneurPulse.com.au  Fact Sheet for Healthcare Providers: IncredibleEmployment.be  This test is not yet approved or cleared by the [place] FDA and has been authorized for detection and/or  diagnosis of SARS-CoV-2 by FDA under an Emergency Use Authorization (EUA). This EUA will remain in effect (meaning this test can be used) for the duration of the COVID-19 declaration under Section 564(b)(1) of the Act, 21 U.S.C. section 360bbb-3(b)(1), unless the authorization is terminated or revoked.  Performed at [facility], [place] [address], [place], [place] [address]   C Difficile Quick Screen w PCR reflex     Status: None   Collection Time: 04/03/22  2:53 PM   Specimen: Nasal Mucosa; Stool  Result Value Ref Range Status   C Diff antigen NEGATIVE NEGATIVE Final   C Diff toxin NEGATIVE NEGATIVE Final   C Diff interpretation No C. difficile detected.  Final     Comment: Performed at [facility], [place] [address] [place], [place] [address]  Gastrointestinal Panel by PCR , Stool     Status: None   Collection Time: 04/03/22  2:54 PM   Specimen: Nasal Mucosa; Stool  Result Value Ref Range Status   Campylobacter species NOT DETECTED NOT DETECTED Final   Plesimonas shigelloides NOT DETECTED NOT DETECTED Final   Salmonella species NOT DETECTED NOT DETECTED Final   Yersinia enterocolitica NOT DETECTED NOT DETECTED Final   Vibrio species NOT DETECTED NOT DETECTED Final   Vibrio cholerae NOT DETECTED NOT DETECTED Final   Enteroaggregative E coli (EAEC) NOT DETECTED NOT DETECTED Final   Enteropathogenic E coli (EPEC) NOT DETECTED NOT DETECTED Final   Enterotoxigenic E coli (ETEC) NOT DETECTED NOT DETECTED Final   Shiga like toxin producing E coli (STEC) NOT DETECTED NOT DETECTED Final   Shigella/Enteroinvasive E coli (EIEC) NOT DETECTED NOT DETECTED Final   Cryptosporidium NOT DETECTED NOT DETECTED Final   Cyclospora cayetanensis NOT DETECTED NOT DETECTED Final   Entamoeba histolytica NOT DETECTED NOT DETECTED Final   Giardia lamblia NOT DETECTED NOT DETECTED Final   Adenovirus F40/41 NOT DETECTED NOT DETECTED Final   Astrovirus NOT DETECTED NOT DETECTED Final   Norovirus GI/GII NOT DETECTED NOT DETECTED Final   Rotavirus A NOT DETECTED NOT DETECTED Final   Sapovirus (I, II, IV, and V) NOT DETECTED NOT DETECTED Final    Comment: Performed at [facility], [address], [place], [place] [address]  Urine Culture     Status: Abnormal   Collection Time: 04/03/22  3:19 PM   Specimen: Urine, Catheterized  Result Value Ref Range Status   Specimen Description   Final    URINE, CATHETERIZED Performed at [facility], [place] [address] [place], [place] [address]    Special Requests   Final    NONE Performed at [facility], [place] [address] [place], [place] [address]    Culture 20,000  COLONIES/mL KLEBSIELLA OXYTOCA (A)  Final   Report Status 04/05/2022 FINAL  Final   Organism ID, Bacteria KLEBSIELLA OXYTOCA (A)  Final      Susceptibility   Klebsiella oxytoca - MIC*    AMPICILLIN >=32 RESISTANT Resistant     CEFAZOLIN <=4 SENSITIVE Sensitive     CEFEPIME <=0.12 SENSITIVE Sensitive     CEFTRIAXONE <=0.25 SENSITIVE Sensitive     CIPROFLOXACIN <=0.25 SENSITIVE Sensitive     GENTAMICIN <=1 SENSITIVE Sensitive     IMIPENEM <=0.25 SENSITIVE Sensitive     NITROFURANTOIN <=16 SENSITIVE Sensitive     TRIMETH/SULFA <=20 SENSITIVE Sensitive     AMPICILLIN/SULBACTAM 8 SENSITIVE Sensitive     PIP/TAZO <=4 SENSITIVE Sensitive     * 20,000 COLONIES/mL KLEBSIELLA  OXYTOCA  MRSA Next Gen by PCR, Nasal     Status: None   Collection Time: 04/03/22  8:36 PM   Specimen: Nasal Mucosa; Nasal Swab  Result Value Ref Range Status   MRSA by PCR Next Gen NOT DETECTED NOT DETECTED Final    Comment: (NOTE) The GeneXpert MRSA Assay (FDA approved for NASAL specimens only), is one component of a comprehensive MRSA colonization surveillance program. It is not intended to diagnose MRSA infection nor to guide or monitor treatment for MRSA infections. Test performance is not FDA approved in patients less than 66 years old. Performed at [facility], [place] [address], [place], [place] [address]       Radiology Studies: DG Abd 1 View  Result Date: 04/08/2022 CLINICAL DATA:  61 year old female history of abdominal distension. EXAM: ABDOMEN - 1 VIEW COMPARISON:  No prior abdominal radiograph. CT of the abdomen and pelvis 04/03/2022. FINDINGS: Diffuse gaseous distension is noted throughout the small bowel and colon. Small bowel loops measure up to 4.1 cm in the central abdomen. Rectal gas is noted. No definite pneumoperitoneum confidently identified. IMPRESSION: Abnormal bowel-gas pattern, most suggestive of an ileus, as above. Electronically Signed   By: Vinnie Langton M.D.   On:  04/08/2022 07:07   ECHOCARDIOGRAM COMPLETE  Result Date: 04/06/2022    ECHOCARDIOGRAM REPORT   Patient Name:   Angela Mcpherson Date of Exam: 04/06/2022 Medical Rec #:  [id number]      Height:       64.0 in Accession #:    [id number]     Weight:       108.7 lb Date of Birth:  [birth year]       BSA:          1.510 m Patient Age:    78 years       BP:           129/85 mmHg Patient Gender: F              HR:           116 bpm. Exam Location:  Inpatient Procedure: 2D Echo, Cardiac Doppler, Color Doppler and 3D Echo Indications:    Elevated troponins  History:        Patient has no prior history of Echocardiogram examinations.                 Arrythmias:Tachycardia.  Sonographer:    Merrie Roof RDCS Referring Phys: [place]  1. Left ventricular ejection fraction, by estimation, is 55 to 60%. Left ventricular ejection fraction by 3D volume is 63 %. The left ventricle has normal function. The left ventricle has no regional wall motion abnormalities.  2. Right ventricular systolic function is normal. The right ventricular size is normal. There is normal pulmonary artery systolic pressure. The estimated right ventricular systolic pressure is 23.5 mmHg.  3. Left atrial size was mildly dilated.  4. The mitral valve is normal in structure. No evidence of mitral valve regurgitation.  5. The aortic valve is tricuspid. There is mild calcification of the aortic valve. There is mild thickening of the aortic valve. Aortic valve regurgitation is not visualized. Aortic valve sclerosis/calcification is present, without any evidence of aortic stenosis.  6. The inferior vena cava is collapsed, consistent with low right atrial pressure. Conclusion(s)/Recommendation(s): Echo findings suggest hypovolemia. FINDINGS  Left Ventricle: Impaired relaxation pattern may be due to hypovolemia (diastolic mitral annulus velocities are normal for age). Left ventricular ejection fraction, by  estimation, is 55 to 60%. Left ventricular  ejection fraction by 3D volume is 63 %. The  left ventricle has normal function. The left ventricle has no regional wall motion abnormalities. The left ventricular internal cavity size was normal in size. There is no left ventricular hypertrophy. Right Ventricle: The right ventricular size is normal. No increase in right ventricular wall thickness. Right ventricular systolic function is normal. There is normal pulmonary artery systolic pressure. The tricuspid regurgitant velocity is 2.59 m/s, and  with an assumed right atrial pressure of 0 mmHg, the estimated right ventricular systolic pressure is 60.4 mmHg. Left Atrium: Left atrial size was mildly dilated. Right Atrium: Right atrial size was normal in size. Pericardium: There is no evidence of pericardial effusion. Mitral Valve: The mitral valve is normal in structure. No evidence of mitral valve regurgitation. Tricuspid Valve: The tricuspid valve is normal in structure. Tricuspid valve regurgitation is trivial. Aortic Valve: The aortic valve is tricuspid. There is mild calcification of the aortic valve. There is mild thickening of the aortic valve. Aortic valve regurgitation is not visualized. Aortic valve sclerosis/calcification is present, without any evidence of aortic stenosis. Aortic valve mean gradient measures 10.3 mmHg. Aortic valve peak gradient measures 18.6 mmHg. Aortic valve area, by VTI measures 1.91 cm. Pulmonic Valve: The pulmonic valve was not well visualized. Pulmonic valve regurgitation is not visualized. Aorta: The aortic root and ascending aorta are structurally normal, with no evidence of dilitation. Venous: The inferior vena cava is collapsed, consistent with low right atrial pressure. IAS/Shunts: The interatrial septum was not well visualized.  LEFT VENTRICLE PLAX 2D LVIDd:         4.30 cm   Diastology LVIDs:         3.10 cm   LV e' medial:    7.29 cm/s LV PW:         1.00 cm   LV E/e' medial:  10.8 LV IVS:        1.10 cm   LV e' lateral:    10.70 cm/s LVOT diam:     2.00 cm   LV E/e' lateral: 7.4 LV SV:         62 LV SV Index:   41 LVOT Area:     3.14 cm                           3D Volume EF:                          3D EF:        63 %                          LV EDV:       103 ml                          LV ESV:       38 ml                          LV SV:        65 ml RIGHT VENTRICLE RV Basal diam:  2.60 cm RV S prime:     11.40 cm/s TAPSE (M-mode): 2.6 cm LEFT ATRIUM           Index  RIGHT ATRIUM           Index LA diam:      2.90 cm 1.92 cm/m   RA Area:     14.30 cm LA Vol (A2C): 38.7 ml 25.63 ml/m  RA Volume:   30.20 ml  20.00 ml/m LA Vol (A4C): 52.0 ml 34.44 ml/m  AORTIC VALVE AV Area (Vmax):    2.01 cm AV Area (Vmean):   1.91 cm AV Area (VTI):     1.91 cm AV Vmax:           215.67 cm/s AV Vmean:          150.000 cm/s AV VTI:            0.324 m AV Peak Grad:      18.6 mmHg AV Mean Grad:      10.3 mmHg LVOT Vmax:         138.00 cm/s LVOT Vmean:        91.200 cm/s LVOT VTI:          0.197 m LVOT/AV VTI ratio: 0.61  AORTA Ao Root diam: 3.00 cm Ao Asc diam:  2.70 cm MITRAL VALVE               TRICUSPID VALVE MV Area (PHT): 4.71 cm    TR Peak grad:   26.8 mmHg MV Decel Time: 161 msec    TR Vmax:        259.00 cm/s MV E velocity: 78.80 cm/s MV A velocity: 98.10 cm/s  SHUNTS MV E/A ratio:  0.80        Systemic VTI:  0.20 m                            Systemic Diam: 2.00 cm Dani Gobble Croitoru MD Electronically signed by Sanda Klein MD Signature Date/Time: 04/06/2022/1:20:24 PM    Final        LOS: 5 days   Bonnielee Haff  [facility] Pager on [email or web address]  04/08/2022, 10:05 AM

## 2022-04-08 NOTE — Progress Notes (Signed)
   04/08/22 0742  Assess: MEWS Score  Temp (!) 101.5 F (38.6 C)  BP 99/62  MAP (mmHg) 74  Pulse Rate (!) 121  Resp 16  Level of Consciousness Alert  SpO2 95 %  O2 Device Room Air  Assess: MEWS Score  MEWS Temp 2  MEWS Systolic 1  MEWS Pulse 2  MEWS RR 0  MEWS LOC 0  MEWS Score 5  MEWS Score Color Red  Assess: if the MEWS score is Yellow or Red  Were vital signs taken at a resting state? Yes  Focused Assessment Change from prior assessment (see assessment flowsheet)  Does the patient meet 2 or more of the SIRS criteria? Yes  Does the patient have a confirmed or suspected source of infection? Yes  Provider and Rapid Response Notified? Yes  MEWS guidelines implemented *See Row Information* Yes  Treat  MEWS Interventions Escalated (See documentation below);Administered scheduled meds/treatments  Pain Scale 0-10  Pain Score 0  Take Vital Signs  Increase Vital Sign Frequency  Red: Q 1hr X 4 then Q 4hr X 4, if remains red, continue Q 4hrs  Escalate  MEWS: Escalate Red: discuss with charge nurse/RN and provider, consider discussing with RRT  Notify: Charge Nurse/RN  Name of Charge Nurse/RN Notified Russ Halo, RN  Date Charge Nurse/RN Notified 04/08/22  Time Charge Nurse/RN Notified 0745  Notify: Provider  Provider Name/Title Bonnielee Haff, MD  Date Provider Notified 04/08/22  Time Provider Notified [phone]  Method of Notification Page  Notification Reason Change in status  Provider response See new orders  Date of Provider Response 04/08/22  Time of Provider Response 0753  Notify: Rapid Response  Name of Rapid Response RN Notified Cyndie Chime, RN  Date Rapid Response Notified 04/08/22  Time Rapid Response Notified 0808  Assess: SIRS CRITERIA  SIRS Temperature  1  SIRS Pulse 1  SIRS Respirations  0  SIRS WBC 1  SIRS Score Sum  3   Patient denies pain while still, endorses 10/10 abdominal pain with movement and palpation.  Angie Fava, RN

## 2022-04-08 NOTE — Consult Note (Addendum)
Angela Mcpherson  May 05, 1961 [id number]  CARE TEAM:  PCP: Default, Provider, MD  Outpatient Care Team: Patient Care Team: Default, Provider, MD as PCP - General Truitt Merle, MD as Consulting Physician (Oncology) Milus Banister, MD as Attending Physician (Gastroenterology) Kyung Rudd, MD as Consulting Physician (Radiation Oncology)  Inpatient Treatment Team: Treatment Team: Attending Provider: Bonnielee Haff, MD; Consulting Physician: Truitt Merle, MD; Rounding Team: Threasa Beards, MD; Registered Nurse: Angie Fava, RN; Clerk: Simeon Craft; Utilization Review: Micah Noel, RN; Case Manager: Shanon Payor, RN; Mobility Specialist: Ramirez-Flores, Hassan Rowan; Consulting Physician: Edison Pace, Md, MD   This patient is a 61 y.o.female who presents today for surgical evaluation at the request of Dr [place] Pink.   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  Has been on chemotherapy since last year by Dr. Burr Medico.  I think there is discussion about colorectal surgical consultation but [facility] never saw the patient.  Capecitabine and bevacizumab.  Had to pause in October for hospitalization.  Chemotherapy resumed November 2022.  Evidence of progression.  Recently started on oxaliplatin based therapy.  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  Patient had worsening mental confusion and abdominal pain.  Admitted with encephalopathy.  Had last dose of chemotherapy on 8/15.  Admitted 8/17.  Found to have diffuse colonic thickening from cecum to splenic flexure.  Concerning for colitis.  Admitted and placed on antibiotics.  Blood cultures growing out Citrobacter and E. coli.  Urine culture showing Klebsiella.  Some concern for urinary retention.  Infectious disease consulted.  INR above 4 on admission.  Anticoagulation  Eliquis was held.  Restarted yesterday when she seemed to be better.  Then she felt worse.  CT scan shows pelvic fluid condition and pelvis suspicious for abscess.  Concern for being contiguous with rectosigmoid cancer.  Some free fluid but no definite abscess or pneumoperitoneum.  Surgical consultation requested.  Patient walking around in room with husband.  Denies much in the way of abdominal pain to me but apparently has had complaints to others.  No nausea or vomiting but rather distended.  No hematemesis.  No rectal bleeding.  Assessment  Angela Mcpherson  61 y.o. female       Problem List:  Principal Problem:   E coli bacteremia Active Problems:   Malignant neoplasm of rectosigmoid junction (HCC)   Acute encephalopathy   Aortic mural thrombus (HCC)   Thrombocytopenia (HCC)   Protein-calorie malnutrition, severe   Hepatitis C   Sepsis (HCC)   Normocytic anemia   Elevated troponin   Acute urinary retention   Malnutrition of moderate degree   Pelvic abscess in female   Metastatic colorectal cancer and chemotherapy, last dose a week ago.  Admission with diarrhea and colitis on antibiotics.  New pelvic abscess concern for contained perforation at cancer.  Plan:  IV antibiotics.  Usually her standard is to do piperacillin/tazobactam for colon perforation and complex situations.  Apparently infectious ease have concerns about potential resistance to the gram-negative rods that she grew out in her blood cultures and urine cultures although it is sensitive to it.  Agree with at least having better anaerobic coverage given the progression of an abscess on of antibiotics.  She is not in peritonitis shock warranting emergent operative intervention.  She would not be a good candidate given her malnourished state and  recent chemotherapy.  Risk of dehiscence and other issues rather high.  Do not know how well we could avoid abdominal colectomy or Hartmann resection in the setting of a  bulky rectosigmoid tumor with proximal colitis.  No easy options here.   Therefore, would like to have drainage of the abscess.  Most likely will need a transgluteal approach.  Sounds like her Eliquis was held and then restarted yesterday.  Hold it again.  Interestingly, her INR was 4 on admission.  I do not see that it was rechecked.  I am pretty sure we need to get that under better control before considering any intervention.  If INR is okay, perhaps they can consider doing it tomorrow.  They may wish to wait longer but she has progressed on appropriate antibiotics.  Input from Dr. Burr Medico will be helpful.  I believe she returns this week.   I reviewed nursing notes, Consultant ID, med onc notes, hospitalist notes, last 24 h vitals and pain scores, last 48 h intake and output, last 24 h labs and trends, and last 24 h imaging results. I have reviewed this patient's available data, including medical history, events of note, test results, etc as part of my evaluation.  A significant portion of that time was spent in counseling.  Care during the described time interval was provided by me.  This care required high  level of medical decision making.  04/08/2022  Adin Hector, MD, FACS, MASCRS Esophageal, Gastrointestinal & Colorectal Surgery Robotic and Minimally Invasive Surgery  [facility] [address], [place], [place] [address] [phone] Fax [phone] Main  CONTACT INFORMATION:  Weekday (9AM-5PM): Call [facility] main office at [phone]  Weeknight (5PM-9AM) or Weekend/Holiday: Check [email or web address] (password " [email or web address]") for General Surgery [facility] coverage  (Please, do not use SecureChat as it is not reliable communication to reach operating surgeons for immediate patient care)      04/08/2022      Past Medical History:  Diagnosis Date   Colorectal cancer ([place])    Family history of brain cancer    Family history of  prostate cancer    Family history of stomach cancer    Heart murmur    dx at 26   MVP is stable   Hemorrhoids     Past Surgical History:  Procedure Laterality Date   NO PAST SURGERIES      Social History   Socioeconomic History   Marital status: Married    Spouse name: Not on file   Number of children: 0   Years of education: Not on file   Highest education level: Not on file  Occupational History   Not on file  Tobacco Use   Smoking status: Every Day    Packs/day: 1.00    Years: 26.00    Total pack years: 26.00    Types: Cigarettes   Smokeless tobacco: Never  Vaping Use   Vaping Use: Never used  Substance and Sexual Activity   Alcohol use: Not Currently    Comment: used to drink moderately   Drug use: Never   Sexual activity: Not on file  Other Topics Concern   Not on file  Social History Narrative   Not on file   Social Determinants of Health   Financial Resource Strain: Not on file  Food Insecurity: Not on file  Transportation Needs: Not on file  Physical Activity: Not on file  Stress: Not on file  Social Connections: Not on file  Intimate Partner Violence: Not At Risk (02/26/2021)   Humiliation, Afraid, Rape, and Kick questionnaire    Fear of Current or Ex-Partner: No    Emotionally Abused: No    Physically Abused: No    Sexually Abused: No    Family History  Problem Relation Age of Onset   Prostate cancer Father    Brain cancer Maternal Aunt    Prostate cancer Paternal Uncle    Stomach cancer Maternal Grandfather 66   Colon cancer Neg Hx    Colon polyps Neg Hx    Esophageal cancer Neg Hx    Rectal cancer Neg Hx     Current Facility-Administered Medications  Medication Dose Route Frequency Provider Last Rate Last Admin   0.9 %  sodium chloride infusion   Intravenous Continuous Bonnielee Haff, MD 100 mL/hr at 04/08/22 0905 New Bag at 04/08/22 0905   acetaminophen (TYLENOL) tablet 650 mg  650 mg Oral Q6H PRN Cherylann Ratel A, DO   650 mg at  04/08/22 3267   Or   acetaminophen (TYLENOL) suppository 650 mg  650 mg Rectal Q6H PRN Marylyn Ishihara, Tyrone A, DO       ceFEPIme (MAXIPIME) 2 g in sodium chloride 0.9 % 100 mL IVPB  2 g Intravenous Q12H Mignon Pine, DO       feeding supplement (BOOST / RESOURCE BREEZE) liquid 1 Container  1 Container Oral Q24H Bonnielee Haff, MD   1 Container at 04/07/22 0815   feeding supplement (ENSURE ENLIVE / ENSURE PLUS) liquid 237 mL  237 mL Oral BID BM Bonnielee Haff, MD   237 mL at 04/07/22 2043   iohexol (OMNIPAQUE) 9 MG/ML oral solution            metroNIDAZOLE (FLAGYL) IVPB 500 mg  500 mg Intravenous Q8H Mignon Pine, DO       multivitamin with minerals tablet 1 tablet  1 tablet Oral Daily Bonnielee Haff, MD   1 tablet at 04/07/22 1245   oxyCODONE (Oxy IR/ROXICODONE) immediate release tablet 5 mg  5 mg Oral Q4H PRN Marylyn Ishihara, Tyrone A, DO   5 mg at 04/08/22 8099   prochlorperazine (COMPAZINE) injection 10 mg  10 mg Intravenous Q6H PRN Marylyn Ishihara, Tyrone A, DO         Allergies  Allergen Reactions   Codeine     unknown   Epinephrine (Anaphylaxis)     unknown   Orange Fruit [Citrus] Other (See Comments)    Blisters on face.   Streptogramins     unknown   Tomato Other (See Comments)    Blisters on face    ROS:   All other systems reviewed & are negative except per HPI or as noted below: Constitutional:  No fevers, chills, sweats.  Weight stable Eyes:  No vision changes, No discharge HENT:  No sore throats, nasal drainage Lymph: No neck swelling, No bruising easily Pulmonary:  No cough, productive sputum CV: No orthopnea, PND  Patient walks 1/4 mile without difficulty.  No exertional chest/neck/shoulder/arm pain.  GI:  No personal nor family history of inflammatory bowel disease, irritable bowel syndrome, allergy such as Celiac Sprue, dietary/dairy problems, ulcers nor gastritis.  No recent sick contacts/gastroenteritis.  No travel outside the country.  No changes in diet.  Renal: No UTIs, No  hematuria Genital:  No drainage, bleeding, masses Musculoskeletal: No severe joint pain.  Good ROM major joints Skin:  No sores or lesions Heme/Lymph:  No easy bleeding.  No swollen lymph nodes   BP 106/70 (BP Location: Right Arm)   Pulse 99   Temp 97.6 F (36.4 C) (Axillary)   Resp 16   Ht '5\' 4"'$  (1.626 m)   Wt 49.3 kg   SpO2 99%   BMI 18.66 kg/m   Physical Exam:  Constitutional: Not cachectic.  Hygeine adequate.  Vitals signs as above.   Eyes: Pupils reactive, normal extraocular movements. Sclera nonicteric Neuro: CN II-XII intact.  No major focal sensory defects.  No major motor deficits. Lymph: No head/neck/groin lymphadenopathy Psych:  No severe agitation.  No severe anxiety.  Judgment & insight Adequate, Oriented x4, HENT: Normocephalic, Mucus membranes moist.  No thrush.   Neck: Supple, No tracheal deviation.  No obvious thyromegaly Chest: No pain to chest wall compression.  Good respiratory excursion.  No audible wheezing CV:  Pulses intact.  regular rhythm.  No major extremity edema  Abdomen:   Hernia: Not present. Diastasis recti: Mild supraumbilical midline. Somewhat firm.   Moderately distended.  Nontender.  No hepatomegaly.  No splenomegaly  Gen:  Inguinal hernia: Not present.  Inguinal lymph nodes: without lymphadenopathy.    Rectal: (Deferred)  Ext: No obvious deformity or contracture.  Edema: Not present.  No cyanosis Skin: No major subcutaneous nodules.  Warm and dry Musculoskeletal: Severe joint rigidity not present.  No obvious clubbing.  No digital petechiae.     Results:   Labs: Results for orders placed or performed during the hospital encounter of 04/03/22 (from the past 48 hour(s))  CBC with Differential/Platelet     Status: Abnormal   Collection Time: 04/07/22  3:32 AM  Result Value Ref Range   WBC 16.6 (H) 4.0 - 10.5 K/uL   RBC 3.08 (L) 3.87 - 5.11 MIL/uL   Hemoglobin 10.5 (L) 12.0 - 15.0 g/dL   HCT 30.5 (L) 36.0 - 46.0 %   MCV 99.0 80.0 -  100.0 fL   MCH 34.1 (H) 26.0 - 34.0 pg   MCHC 34.4 30.0 - 36.0 g/dL   RDW 23.8 (H) 11.5 - 15.5 %   Platelets 60 (L) 150 - 400 K/uL    Comment: CONSISTENT WITH PREVIOUS RESULT   nRBC 0.0 0.0 - 0.2 %   Neutrophils Relative % 82 %   Neutro Abs 13.6 (H) 1.7 - 7.7 K/uL   Lymphocytes Relative 9 %   Lymphs Abs 1.4 0.7 - 4.0 K/uL   Monocytes Relative 8 %   Monocytes Absolute 1.3 (H) 0.1 - 1.0 K/uL   Eosinophils Relative 0 %   Eosinophils Absolute 0.1 0.0 - 0.5 K/uL   Basophils Relative 0 %   Basophils Absolute 0.0 0.0 - 0.1 K/uL   Immature Granulocytes 1 %   Abs Immature Granulocytes 0.20 (H) 0.00 - 0.07 K/uL    Comment: Performed at [facility], [place] [address], [place], [place] [address]  Comprehensive metabolic panel     Status: Abnormal   Collection Time: 04/07/22  3:32 AM  Result Value Ref Range   Sodium 136 135 - 145 mmol/L   Potassium 3.8 3.5 - 5.1 mmol/L   Chloride 112 (H) 98 - 111 mmol/L   CO2 20 (L) 22 - 32 mmol/L   Glucose, Bld 120 (H) 70 - 99 mg/dL    Comment: Glucose reference range applies only to samples taken after fasting for at least 8 hours.   BUN 15 6 - 20 mg/dL   Creatinine, Ser 0.63 0.44 - 1.00 mg/dL   Calcium 7.7 (  L) 8.9 - 10.3 mg/dL   Total Protein 4.6 (L) 6.5 - 8.1 g/dL   Albumin 2.1 (L) 3.5 - 5.0 g/dL   AST 42 (H) 15 - 41 U/L   ALT 28 0 - 44 U/L   Alkaline Phosphatase 81 38 - 126 U/L   Total Bilirubin 1.9 (H) 0.3 - 1.2 mg/dL   GFR, Estimated >60 >60 mL/min    Comment: (NOTE) Calculated using the CKD-EPI Creatinine Equation (2021)    Anion gap 4 (L) 5 - 15    Comment: Performed at [facility], [place] [address], [place], [place] [address]  Magnesium     Status: None   Collection Time: 04/07/22  3:32 AM  Result Value Ref Range   Magnesium 2.0 1.7 - 2.4 mg/dL    Comment: Performed at [facility], [place] [address] [place], [place] [address]  CBC with Differential/Platelet     Status: Abnormal    Collection Time: 04/08/22  4:19 AM  Result Value Ref Range   WBC 16.8 (H) 4.0 - 10.5 K/uL   RBC 3.43 (L) 3.87 - 5.11 MIL/uL   Hemoglobin 11.7 (L) 12.0 - 15.0 g/dL   HCT 34.1 (L) 36.0 - 46.0 %   MCV 99.4 80.0 - 100.0 fL   MCH 34.1 (H) 26.0 - 34.0 pg   MCHC 34.3 30.0 - 36.0 g/dL   RDW 24.1 (H) 11.5 - 15.5 %   Platelets 92 (L) 150 - 400 K/uL    Comment: SPECIMEN CHECKED FOR CLOTS Immature Platelet Fraction may be clinically indicated, consider ordering this additional test [id number] CONSISTENT WITH PREVIOUS RESULT REPEATED TO VERIFY    nRBC 0.0 0.0 - 0.2 %   Neutrophils Relative % 84 %   Neutro Abs 14.1 (H) 1.7 - 7.7 K/uL   Lymphocytes Relative 11 %   Lymphs Abs 1.8 0.7 - 4.0 K/uL   Monocytes Relative 3 %   Monocytes Absolute 0.5 0.1 - 1.0 K/uL   Eosinophils Relative 0 %   Eosinophils Absolute 0.1 0.0 - 0.5 K/uL   Basophils Relative 1 %   Basophils Absolute 0.1 0.0 - 0.1 K/uL   Immature Granulocytes 1 %   Abs Immature Granulocytes 0.23 (H) 0.00 - 0.07 K/uL   Ovalocytes PRESENT     Comment: Performed at [facility], [place] [address], [place], [place] [address]  Comprehensive metabolic panel     Status: Abnormal   Collection Time: 04/08/22  4:19 AM  Result Value Ref Range   Sodium 133 (L) 135 - 145 mmol/L   Potassium 3.6 3.5 - 5.1 mmol/L   Chloride 107 98 - 111 mmol/L   CO2 20 (L) 22 - 32 mmol/L   Glucose, Bld 96 70 - 99 mg/dL    Comment: Glucose reference range applies only to samples taken after fasting for at least 8 hours.   BUN 13 6 - 20 mg/dL   Creatinine, Ser 0.56 0.44 - 1.00 mg/dL   Calcium 7.9 (L) 8.9 - 10.3 mg/dL   Total Protein 4.7 (L) 6.5 - 8.1 g/dL   Albumin 2.0 (L) 3.5 - 5.0 g/dL   AST 54 (H) 15 - 41 U/L   ALT 33 0 - 44 U/L   Alkaline Phosphatase 95 38 - 126 U/L   Total Bilirubin 2.4 (H) 0.3 - 1.2 mg/dL   GFR, Estimated >60 >60 mL/min    Comment: (NOTE) Calculated using the CKD-EPI Creatinine Equation (2021)    Anion gap 6 5 -  15     Comment: Performed at [facility], [place] [address] [place], [place] [address]  Glucose, capillary     Status: None   Collection Time: 04/08/22  6:09 AM  Result Value Ref Range   Glucose-Capillary 80 70 - 99 mg/dL    Comment: Glucose reference range applies only to samples taken after fasting for at least 8 hours.  Lactic acid, plasma     Status: Abnormal   Collection Time: 04/08/22  9:26 AM  Result Value Ref Range   Lactic Acid, Venous 2.0 (HH) 0.5 - 1.9 mmol/L    Comment: CRITICAL RESULT CALLED TO, READ BACK BY AND VERIFIED WITH BARBEE,H. RN AT 1015 04/08/22 MULLINS,T Performed at [facility], [place] [address] [place], [place] [address]   Procalcitonin - Baseline     Status: None   Collection Time: 04/08/22  9:26 AM  Result Value Ref Range   Procalcitonin 7.63 ng/mL    Comment:        Interpretation: PCT > 2 ng/mL: Systemic infection (sepsis) is likely, unless other causes are known. (NOTE)       Sepsis PCT Algorithm           Lower Respiratory Tract                                      Infection PCT Algorithm    ----------------------------     ----------------------------         PCT < 0.25 ng/mL                PCT < 0.10 ng/mL          Strongly encourage             Strongly discourage   discontinuation of antibiotics    initiation of antibiotics    ----------------------------     -----------------------------       PCT 0.25 - 0.50 ng/mL            PCT 0.10 - 0.25 ng/mL               OR       >80% decrease in PCT            Discourage initiation of                                            antibiotics      Encourage discontinuation           of antibiotics    ----------------------------     -----------------------------         PCT >= 0.50 ng/mL              PCT 0.26 - 0.50 ng/mL               AND       <80% decrease in PCT              Encourage initiation of                                             antibiotics        Encourage  continuation           of antibiotics    ----------------------------     -----------------------------        PCT >= 0.50 ng/mL                  PCT > 0.50 ng/mL               AND         increase in PCT                  Strongly encourage                                      initiation of antibiotics    Strongly encourage escalation           of antibiotics                                     -----------------------------                                           PCT <= 0.25 ng/mL                                                 OR                                        > 80% decrease in PCT                                      Discontinue / Do not initiate                                             antibiotics  Performed at [place] [address], [place], [place] [address]   Lactic acid, plasma     Status: None   Collection Time: 04/08/22 11:45 AM  Result Value Ref Range   Lactic Acid, Venous 1.9 0.5 - 1.9 mmol/L    Comment: Performed at [facility], [place] [address] [place], [place] [address]    Imaging / Studies: CT ABDOMEN PELVIS W CONTRAST  Result Date: 04/08/2022 CLINICAL DATA:  Sepsis, persistent fever, abdominal pain. * Tracking Code: BO * EXAM: CT ABDOMEN AND PELVIS WITH CONTRAST TECHNIQUE: Multidetector CT imaging of the abdomen and pelvis was performed using the standard protocol following bolus administration of intravenous contrast. RADIATION DOSE REDUCTION: This exam was performed according to the departmental dose-optimization program which includes automated exposure control, adjustment of the mA and/or kV according to patient size and/or use of iterative reconstruction technique. CONTRAST:  143m OMNIPAQUE IOHEXOL 300 MG/ML  SOLN COMPARISON:  None Available. FINDINGS: Lower chest: Bibasilar atelectasis small effusions. Hepatobiliary: No focal hepatic lesion. New  large volume of fluid surrounds the margin of the  liver extends along the RIGHT pericolic gutter. Gallbladder distended without evidence inflammation. No biliary duct dilatation. Portal veins are patent. Pancreas: Pancreas is normal. No ductal dilatation. No pancreatic inflammation. Spleen: Normal spleen Adrenals/urinary tract: Adrenal glands normal. There is mild hydronephrosis hydroureter on the LEFT. Obstructive LEFT ureter likely related to abscess formation in the LEFT pelvis. Stomach/Bowel: Stomach proximal small bowel normal. Contrast flows proximal half way through the small bowel. There is multiple air-fluid levels within the small bowel without significant dilatation. The ascending and transverse colon gas-filled relatively normal. No evidence of colitis seen on comparison exam. Within the LEFT lower quadrant just superior to the operator space is a contained fluid collection which has fluid and gas measuring 4.5 x 6.7 cm (image 82/2). Favor contained perforation of the bowel at rectosigmoid level small wiht extraluminal gas noted on image 79/2. No significant intraperitoneal free air. There is a large volume intraperitoneal free fluid. Margin of the free fluid is enhancing in the LEFT pericolic gutter (image 96/2) consistent peritonitis. Again demonstrated a mass at the rectosigmoid junction. Vascular/Lymphatic: Abdominal aorta is normal caliber with intimal calcification. Lymph node deep to the operator space again noted on the LEFT measuring 3.1 x 2.6 cm Reproductive: Grossly unremarkable Other: None Musculoskeletal: No aggressive osseous lesion. IMPRESSION: 1. Gas and fluid collection in the deep LEFT pelvis most consistent with perforation of the rectosigmoid colon with contained abscess formation. 2. New large volume intraperitoneal free fluid. Evidence of peritonitis associated the free fluid. 3. Small bowel ileus. 4. No large volume intraperitoneal free air. 5. New mild hydro nephrosis and hydroureter on the LEFT presumed related to obstruction of  LEFT ureter by the LEFT pelvic abscess. [facility] on 04/08/2022  at13:59. Electronically Signed   By: Suzy Bouchard M.D.   On: 04/08/2022 14:01   DG Abd 1 View  Result Date: 04/08/2022 CLINICAL DATA:  60 year old female history of abdominal distension. EXAM: ABDOMEN - 1 VIEW COMPARISON:  No prior abdominal radiograph. CT of the abdomen and pelvis 04/03/2022. FINDINGS: Diffuse gaseous distension is noted throughout the small bowel and colon. Small bowel loops measure up to 4.1 cm in the central abdomen. Rectal gas is noted. No definite pneumoperitoneum confidently identified. IMPRESSION: Abnormal bowel-gas pattern, most suggestive of an ileus, as above. Electronically Signed   By: Vinnie Langton M.D.   On: 04/08/2022 07:07   ECHOCARDIOGRAM COMPLETE  Result Date: 04/06/2022    ECHOCARDIOGRAM REPORT   Patient Name:   LEILAH POLIMENI Date of Exam: 04/06/2022 Medical Rec #:  [id number]      Height:       64.0 in Accession #:    [id number]     Weight:       108.7 lb Date of Birth:  1960-09-14       BSA:          1.510 m Patient Age:    29 years       BP:           129/85 mmHg Patient Gender: F              HR:           116 bpm. Exam Location:  Inpatient Procedure: 2D Echo, Cardiac Doppler, Color Doppler and 3D Echo Indications:    Elevated troponins  History:        Patient has no prior history of Echocardiogram examinations.  Arrythmias:Tachycardia.  Sonographer:    Merrie Roof RDCS Referring Phys: [place]  1. Left ventricular ejection fraction, by estimation, is 55 to 60%. Left ventricular ejection fraction by 3D volume is 63 %. The left ventricle has normal function. The left ventricle has no regional wall motion abnormalities.  2. Right ventricular systolic function is normal. The right ventricular size is normal. There is normal pulmonary artery systolic pressure. The estimated right ventricular systolic pressure is 28.7 mmHg.  3. Left atrial  size was mildly dilated.  4. The mitral valve is normal in structure. No evidence of mitral valve regurgitation.  5. The aortic valve is tricuspid. There is mild calcification of the aortic valve. There is mild thickening of the aortic valve. Aortic valve regurgitation is not visualized. Aortic valve sclerosis/calcification is present, without any evidence of aortic stenosis.  6. The inferior vena cava is collapsed, consistent with low right atrial pressure. Conclusion(s)/Recommendation(s): Echo findings suggest hypovolemia. FINDINGS  Left Ventricle: Impaired relaxation pattern may be due to hypovolemia (diastolic mitral annulus velocities are normal for age). Left ventricular ejection fraction, by estimation, is 55 to 60%. Left ventricular ejection fraction by 3D volume is 63 %. The  left ventricle has normal function. The left ventricle has no regional wall motion abnormalities. The left ventricular internal cavity size was normal in size. There is no left ventricular hypertrophy. Right Ventricle: The right ventricular size is normal. No increase in right ventricular wall thickness. Right ventricular systolic function is normal. There is normal pulmonary artery systolic pressure. The tricuspid regurgitant velocity is 2.59 m/s, and  with an assumed right atrial pressure of 0 mmHg, the estimated right ventricular systolic pressure is 86.7 mmHg. Left Atrium: Left atrial size was mildly dilated. Right Atrium: Right atrial size was normal in size. Pericardium: There is no evidence of pericardial effusion. Mitral Valve: The mitral valve is normal in structure. No evidence of mitral valve regurgitation. Tricuspid Valve: The tricuspid valve is normal in structure. Tricuspid valve regurgitation is trivial. Aortic Valve: The aortic valve is tricuspid. There is mild calcification of the aortic valve. There is mild thickening of the aortic valve. Aortic valve regurgitation is not visualized. Aortic valve  sclerosis/calcification is present, without any evidence of aortic stenosis. Aortic valve mean gradient measures 10.3 mmHg. Aortic valve peak gradient measures 18.6 mmHg. Aortic valve area, by VTI measures 1.91 cm. Pulmonic Valve: The pulmonic valve was not well visualized. Pulmonic valve regurgitation is not visualized. Aorta: The aortic root and ascending aorta are structurally normal, with no evidence of dilitation. Venous: The inferior vena cava is collapsed, consistent with low right atrial pressure. IAS/Shunts: The interatrial septum was not well visualized.  LEFT VENTRICLE PLAX 2D LVIDd:         4.30 cm   Diastology LVIDs:         3.10 cm   LV e' medial:    7.29 cm/s LV PW:         1.00 cm   LV E/e' medial:  10.8 LV IVS:        1.10 cm   LV e' lateral:   10.70 cm/s LVOT diam:     2.00 cm   LV E/e' lateral: 7.4 LV SV:         62 LV SV Index:   41 LVOT Area:     3.14 cm  3D Volume EF:                          3D EF:        63 %                          LV EDV:       103 ml                          LV ESV:       38 ml                          LV SV:        65 ml RIGHT VENTRICLE RV Basal diam:  2.60 cm RV S prime:     11.40 cm/s TAPSE (M-mode): 2.6 cm LEFT ATRIUM           Index        RIGHT ATRIUM           Index LA diam:      2.90 cm 1.92 cm/m   RA Area:     14.30 cm LA Vol (A2C): 38.7 ml 25.63 ml/m  RA Volume:   30.20 ml  20.00 ml/m LA Vol (A4C): 52.0 ml 34.44 ml/m  AORTIC VALVE AV Area (Vmax):    2.01 cm AV Area (Vmean):   1.91 cm AV Area (VTI):     1.91 cm AV Vmax:           215.67 cm/s AV Vmean:          150.000 cm/s AV VTI:            0.324 m AV Peak Grad:      18.6 mmHg AV Mean Grad:      10.3 mmHg LVOT Vmax:         138.00 cm/s LVOT Vmean:        91.200 cm/s LVOT VTI:          0.197 m LVOT/AV VTI ratio: 0.61  AORTA Ao Root diam: 3.00 cm Ao Asc diam:  2.70 cm MITRAL VALVE               TRICUSPID VALVE MV Area (PHT): 4.71 cm    TR Peak grad:   26.8 mmHg MV Decel Time:  161 msec    TR Vmax:        259.00 cm/s MV E velocity: 78.80 cm/s MV A velocity: 98.10 cm/s  SHUNTS MV E/A ratio:  0.80        Systemic VTI:  0.20 m                            Systemic Diam: 2.00 cm Dani Gobble Croitoru MD Electronically signed by Sanda Klein MD Signature Date/Time: 04/06/2022/1:20:24 PM    Final    CT Angio Chest PE W and/or Wo Contrast  Result Date: 04/03/2022 CLINICAL DATA:  Hypoxia. EXAM: CT ANGIOGRAPHY CHEST WITH CONTRAST TECHNIQUE: Multidetector CT imaging of the chest was performed using the standard protocol during bolus administration of intravenous contrast. Multiplanar CT image reconstructions and MIPs were obtained to evaluate the vascular anatomy. RADIATION DOSE REDUCTION: This exam was performed according to the departmental dose-optimization program which includes automated exposure control, adjustment of the mA and/or kV according to patient  size and/or use of iterative reconstruction technique. CONTRAST:  12m OMNIPAQUE IOHEXOL 300 MG/ML  SOLN COMPARISON:  March 18, 2022. FINDINGS: Cardiovascular: Satisfactory opacification of the pulmonary arteries to the segmental level. No evidence of pulmonary embolism. Normal heart size. No pericardial effusion. Mediastinum/Nodes: No enlarged mediastinal, hilar, or axillary lymph nodes. Thyroid gland, trachea, and esophagus demonstrate no significant findings. Lungs/Pleura: Lungs are clear. No pleural effusion or pneumothorax. Upper Abdomen: No acute abnormality. Musculoskeletal: No chest wall abnormality. No acute or significant osseous findings. Review of the MIP images confirms the above findings. IMPRESSION: No definite evidence of pulmonary embolus. No definite abnormality seen in the chest. Electronically Signed   By: JMarijo ConceptionM.D.   On: 04/03/2022 14:41   CT ABDOMEN PELVIS W CONTRAST  Result Date: 04/03/2022 CLINICAL DATA:  Weakness, hypoxia, metastatic colon cancer * Tracking Code: BO * EXAM: CT ABDOMEN AND PELVIS WITH  CONTRAST TECHNIQUE: Multidetector CT imaging of the abdomen and pelvis was performed using the standard protocol following bolus administration of intravenous contrast. RADIATION DOSE REDUCTION: This exam was performed according to the departmental dose-optimization program which includes automated exposure control, adjustment of the mA and/or kV according to patient size and/or use of iterative reconstruction technique. CONTRAST:  1047mOMNIPAQUE IOHEXOL 300 MG/ML  SOLN COMPARISON:  CT chest abdomen pelvis, 03/18/2022 FINDINGS: Lower chest: No acute abnormality. Please see separately reported examination of the chest. Hepatobiliary: No solid liver abnormality is seen. No gallstones, gallbladder wall thickening, or biliary dilatation. Pancreas: Unremarkable. No pancreatic ductal dilatation or surrounding inflammatory changes. Spleen: Splenomegaly, maximum coronal span 14.7 cm. Adrenals/Urinary Tract: Adrenal glands are unremarkable. Kidneys are normal, without renal calculi, solid lesion, or hydronephrosis. Mildly distended urinary bladder. Stomach/Bowel: Stomach is within normal limits. Appendix appears normal. New, diffuse, long segment wall thickening of the colon, involving the cecum through the splenic flexure (series 7, image 29, 17, series 4, image 55). Unchanged circumferential mass of the rectosigmoid junction (series 4, image 73). Vascular/Lymphatic: Aortic atherosclerosis. Unchanged left mesorectal or pelvic sidewall lymph node measuring 3.6 x 2.3 cm (series 4, image 79). Unchanged prominent subcentimeter retroperitoneal lymph nodes (series 4, image 46). Reproductive: No mass or other significant abnormality. Other: No abdominal wall hernia or abnormality. No ascites. Musculoskeletal: No acute or significant osseous findings. IMPRESSION: 1. New, diffuse, long segment wall thickening of the colon, involving the cecum through the splenic flexure. Findings are consistent with nonspecific infectious,  inflammatory, or ischemic colitis. 2. Unchanged circumferential mass of the rectosigmoid junction. Unchanged enlarged left mesorectal or pelvic sidewall lymph node. 3. Distended urinary bladder.  Correlate for urinary retention. 4. Splenomegaly. Aortic Atherosclerosis (ICD10-I70.0). Electronically Signed   By: AlDelanna Ahmadi.D.   On: 04/03/2022 14:40   DG Chest Port 1 View  Result Date: 04/03/2022 CLINICAL DATA:  Weakness EXAM: PORTABLE CHEST 1 VIEW COMPARISON:  11/18/2021 FINDINGS: The heart size and mediastinal contours are within normal limits. Both lungs are clear. The visualized skeletal structures are unremarkable. IMPRESSION: No acute abnormality lungs in AP portable projection. Electronically Signed   By: AlDelanna Ahmadi.D.   On: 04/03/2022 10:50   CT CHEST ABDOMEN PELVIS W CONTRAST  Result Date: 03/18/2022 CLINICAL DATA:  Metastatic colon cancer, assess treatment response, chemotherapy complete, immunotherapy and Xeloda ongoing rectal pain and diarrhea * Tracking Code: BO * EXAM: CT CHEST, ABDOMEN, AND PELVIS WITH CONTRAST TECHNIQUE: Multidetector CT imaging of the chest, abdomen and pelvis was performed following the standard protocol during bolus administration of intravenous contrast. RADIATION  DOSE REDUCTION: This exam was performed according to the departmental dose-optimization program which includes automated exposure control, adjustment of the mA and/or kV according to patient size and/or use of iterative reconstruction technique. CONTRAST:  133m OMNIPAQUE IOHEXOL 300 MG/ML SOLN additional oral enteric contrast COMPARISON:  12/09/2021 FINDINGS: CT CHEST FINDINGS Cardiovascular: Aortic atherosclerosis. Normal heart size. Left coronary artery calcifications. No pericardial effusion. Mediastinum/Nodes: No enlarged mediastinal, hilar, or axillary lymph nodes. Thyroid gland, trachea, and esophagus demonstrate no significant findings. Lungs/Pleura: Unchanged triangular fissural nodule of the  lateral segment right middle lobe abutting the minor fissure measuring 0.5 cm consistent with a benign fissural lymph node (series 6, image 80). No pleural effusion or pneumothorax. Musculoskeletal: No chest wall mass or suspicious osseous lesions identified. CT ABDOMEN PELVIS FINDINGS Hepatobiliary: No solid liver abnormality is seen. No gallstones. Mild gallbladder wall thickening (series 2, image 66). New, mild, intra and extrahepatic biliary ductal dilatation, the central common bile duct measuring up to 1.1 cm in caliber although tapering smoothly to the ampulla without obstructing calculus or other lesion appreciated. Pancreas: Unremarkable. No pancreatic ductal dilatation or surrounding inflammatory changes. Spleen: New splenomegaly, maximum coronal span 16.5 cm (series 4, image 70). Adrenals/Urinary Tract: Adrenal glands are unremarkable. Kidneys are normal, without renal calculi, solid lesion, or hydronephrosis. Bladder is unremarkable. Stomach/Bowel: Stomach is within normal limits. Appendix appears normal. Moderate burden of stool throughout the colon. Generally unchanged appearance of a circumferential mass of the rectosigmoid junction (series 2, image 104). Vascular/Lymphatic: Aortic atherosclerosis. Interval enlargement of a left mesorectal or pelvic sidewall lymph node measuring 3.7 x 2.3 cm, previously 2.9 x 1.8 cm (series 2, image 108). Unchanged prominent subcentimeter retroperitoneal lymph nodes (series 2, image 73). Reproductive: No mass or other abnormality. Other: No abdominal wall hernia or abnormality. No ascites. Musculoskeletal: No acute osseous findings. IMPRESSION: 1. Generally unchanged appearance of a circumferential mass of the rectosigmoid junction consistent with primary colon malignancy. 2. Interval enlargement of a left mesorectal or pelvic sidewall lymph node, consistent with worsened nodal metastatic disease. Additional unchanged unchanged prominent subcentimeter retroperitoneal  lymph nodes. 3. No evidence of metastatic disease in the chest. 4. Mild gallbladder wall thickening with new, mild, intra and extrahepatic biliary ductal dilatation, the central common bile duct measuring up to 1.1 cm in caliber although tapering smoothly to the ampulla without obstructing calculus or other lesion appreciated. No gallstones appreciated. Correlate with biochemical evidence of cholestasis. 5. New splenomegaly, maximum coronal span 16.5 cm. 6. Coronary artery disease. Aortic Atherosclerosis (ICD10-I70.0). Electronically Signed   By: ADelanna AhmadiM.D.   On: 03/18/2022 11:58    Medications / Allergies: per chart  Antibiotics: Anti-infectives (From admission, onward)    Start     Dose/Rate Route Frequency Ordered Stop   04/08/22 2100  ceFEPIme (MAXIPIME) 2 g in sodium chloride 0.9 % 100 mL IVPB        2 g 200 mL/hr over 30 Minutes Intravenous Every 12 hours 04/08/22 1453     04/08/22 1515  piperacillin-tazobactam (ZOSYN) IVPB 3.375 g  Status:  Discontinued        3.375 g 12.5 mL/hr over 240 Minutes Intravenous Every 8 hours 04/08/22 1428 04/08/22 1447   04/08/22 1452  metroNIDAZOLE (FLAGYL) IVPB 500 mg        500 mg 100 mL/hr over 60 Minutes Intravenous Every 8 hours 04/08/22 1453     04/07/22 2000  ceFEPIme (MAXIPIME) 2 g in sodium chloride 0.9 % 100 mL IVPB  Status:  Discontinued  2 g 200 mL/hr over 30 Minutes Intravenous Every 12 hours 04/07/22 1600 04/08/22 1428   04/04/22 0900  cefTRIAXone (ROCEPHIN) 2 g in sodium chloride 0.9 % 100 mL IVPB  Status:  Discontinued        2 g 200 mL/hr over 30 Minutes Intravenous Every 24 hours 04/04/22 0257 04/07/22 1600   04/03/22 1700  ceFEPIme (MAXIPIME) 2 g in sodium chloride 0.9 % 100 mL IVPB  Status:  Discontinued        2 g 200 mL/hr over 30 Minutes Intravenous Every 8 hours 04/03/22 1634 04/04/22 0257   04/03/22 1500  metroNIDAZOLE (FLAGYL) IVPB 500 mg  Status:  Discontinued        500 mg 100 mL/hr over 60 Minutes Intravenous  Every 12 hours 04/03/22 1446 04/04/22 0257   04/03/22 1045  ceFEPIme (MAXIPIME) 2 g in sodium chloride 0.9 % 100 mL IVPB        2 g 200 mL/hr over 30 Minutes Intravenous  Once 04/03/22 1031 04/03/22 1429   04/03/22 1045  vancomycin (VANCOCIN) IVPB 1000 mg/200 mL premix        1,000 mg 200 mL/hr over 60 Minutes Intravenous  Once 04/03/22 1031 04/03/22 1241         Note: Portions of this report may have been transcribed using voice recognition software. Every effort was made to ensure accuracy; however, inadvertent computerized transcription errors may be present.   Any transcriptional errors that result from this process are unintentional.    Adin Hector, MD, FACS, MASCRS Esophageal, Gastrointestinal & Colorectal Surgery Robotic and Minimally Invasive Surgery  [facility]. [address], [place], [place] [address] [phone] Fax [phone] Main  CONTACT INFORMATION:  Weekday (9AM-5PM): Call [facility] main office at [phone]  Weeknight (5PM-9AM) or Weekend/Holiday: Check [email or web address] (password " [email or web address]") for General Surgery [facility] coverage  (Please, do not use SecureChat as it is not reliable communication to reach operating surgeons for immediate patient care)       04/08/2022  3:02 PM

## 2022-04-08 NOTE — Progress Notes (Signed)
Patient assisted to [facility] where she sat for 5 minutes but was unable to void.  Abdomen noted to be distended and tender to palpation.  Patient c/o pain in abdomen when moving, none when still.  Patient not passing gas, which she says is "unusual."  Angie Fava, RN

## 2022-04-08 NOTE — Progress Notes (Signed)
Angela Mcpherson   DOB:[birth year]   OX#:[id number]   QAS#:[id number]  Oncology follow up   Subjective: Pt had a persistent fever and abdominal pain, CT scan today showed pelvic abscess.  Patient has been seen by ID and IR will drain her abscess related today.  She is awake, alert, appears to be comfortable.  Her husband was also at the bedside.  Objective:  Vitals:   04/08/22 1221 04/08/22 1554  BP: 106/70 103/74  Pulse: 99 100  Resp: 16 18  Temp: 97.6 F (36.4 C) 97.7 F (36.5 C)  SpO2: 99% 99%    Body mass index is 18.66 kg/m.  Intake/Output Summary (Last 24 hours) at 04/08/2022 1647 Last data filed at 04/08/2022 1448 Gross per 24 hour  Intake 590 ml  Output 855 ml  Net -265 ml     Sclerae unicteric  Oropharynx clear  No peripheral adenopathy  Lungs clear -- no rales or rhonchi  Heart regular rate and rhythm  Abdomen soft, mildly distended, diffuse tenderness in the left side  MSK no focal spinal tenderness, no peripheral edema  Neuro nonfocal    CBG (last 3)  Recent Labs    04/08/22 0609  GLUCAP 80     Labs:   Urine Studies No results for input(s): "UHGB", "CRYS" in the last 72 hours.  Invalid input(s): "UACOL", "UAPR", "USPG", "UPH", "UTP", "UGL", "UKET", "UBIL", "UNIT", "UROB", "ULEU", "UEPI", "UWBC", "URBC", "UBAC", "CAST", "UCOM", "BILUA"  Basic Metabolic Panel: Recent Labs  Lab 04/04/22 0336 04/04/22 0744 04/05/22 0502 04/06/22 0352 04/07/22 0332 04/08/22 0419  NA 140  --  137 136 136 133*  K 3.4*  --  2.9* 3.8 3.8 3.6  CL 115*  --  109 111 112* 107  CO2 21*  --  23 22 20* 20*  GLUCOSE 130*  --  167* 116* 120* 96  BUN 32*  --  '19 17 15 13  '$ CREATININE 0.67  --  0.76 0.70 0.63 0.56  CALCIUM 7.6*  --  8.4* 7.9* 7.7* 7.9*  MG  --  2.1  --  1.6* 2.0  --    GFR Estimated Creatinine Clearance: 58.2 mL/min (by C-G formula based on SCr of 0.56 mg/dL). Liver Function Tests: Recent Labs  Lab 04/04/22 0336 04/05/22 0502 04/05/22 1016 04/06/22 0352  04/07/22 0332 04/08/22 0419  AST 23 32  --  32 42* 54*  ALT 15 19  --  22 28 33  ALKPHOS 67 91  --  72 81 95  BILITOT 2.0* 1.6* 1.6* 1.8* 1.9* 2.4*  PROT 4.3* 5.1*  --  4.5* 4.6* 4.7*  ALBUMIN 2.1* 2.4*  --  2.1* 2.1* 2.0*   Recent Labs  Lab 04/03/22 1126  LIPASE 44   Recent Labs  Lab 04/03/22 1426  AMMONIA 34   Coagulation profile Recent Labs  Lab 04/03/22 1035 04/04/22 0336 04/08/22 1517  INR 3.0* 4.0* 2.8*    CBC: Recent Labs  Lab 04/03/22 1035 04/04/22 0336 04/05/22 0502 04/06/22 0352 04/07/22 0332 04/08/22 0419  WBC 13.3* 6.9 8.1 12.0* 16.6* 16.8*  NEUTROABS 11.5*  --  6.7 9.3* 13.6* 14.1*  HGB 11.5* 9.5* 11.5* 10.0* 10.5* 11.7*  HCT 33.8* 28.2* 32.7* 28.9* 30.5* 34.1*  MCV 99.1 99.3 97.3 98.3 99.0 99.4  PLT 72* 39* 56* 46* 60* 92*   Cardiac Enzymes: No results for input(s): "CKTOTAL", "CKMB", "CKMBINDEX", "TROPONINI" in the last 168 hours. BNP: Invalid input(s): "POCBNP" CBG: Recent Labs  Lab 04/08/22 0609  GLUCAP 80  D-Dimer No results for input(s): "DDIMER" in the last 72 hours. Hgb A1c No results for input(s): "HGBA1C" in the last 72 hours. Lipid Profile No results for input(s): "CHOL", "HDL", "LDLCALC", "TRIG", "CHOLHDL", "LDLDIRECT" in the last 72 hours. Thyroid function studies No results for input(s): "TSH", "T4TOTAL", "T3FREE", "THYROIDAB" in the last 72 hours.  Invalid input(s): "FREET3" Anemia work up No results for input(s): "VITAMINB12", "FOLATE", "FERRITIN", "TIBC", "IRON", "RETICCTPCT" in the last 72 hours. Microbiology Recent Results (from the past 240 hour(s))  Culture, blood (routine x 2)     Status: Abnormal (Preliminary result)   Collection Time: 04/03/22 10:40 AM   Specimen: BLOOD  Result Value Ref Range Status   Specimen Description   Final    BLOOD RIGHT ANTECUBITAL Performed at [facility] [address], [place], [place] [address]    Special Requests   Final    BOTTLES DRAWN AEROBIC AND  ANAEROBIC Blood Culture results may not be optimal due to an inadequate volume of blood received in culture bottles Performed at [facility] [address], [place], [place] [address]    Culture  Setup Time   Final    GRAM NEGATIVE RODS IN BOTH AEROBIC AND ANAEROBIC BOTTLES CRITICAL VALUE NOTED.  VALUE IS CONSISTENT WITH PREVIOUSLY REPORTED AND CALLED VALUE.    Culture (A)  Final    CITROBACTER BRAAKII CULTURE REINCUBATED FOR BETTER GROWTH Performed at [facility], [place] [address], [place], [place] [address]    Report Status PENDING  Incomplete   Organism ID, Bacteria CITROBACTER BRAAKII  Final      Susceptibility   Citrobacter braakii - MIC*    CEFAZOLIN RESISTANT Resistant     CEFEPIME <=0.12 SENSITIVE Sensitive     CEFTAZIDIME <=1 SENSITIVE Sensitive     CEFTRIAXONE <=0.25 SENSITIVE Sensitive     CIPROFLOXACIN <=0.25 SENSITIVE Sensitive     GENTAMICIN <=1 SENSITIVE Sensitive     IMIPENEM <=0.25 SENSITIVE Sensitive     TRIMETH/SULFA <=20 SENSITIVE Sensitive     PIP/TAZO <=4 SENSITIVE Sensitive     * CITROBACTER BRAAKII  Culture, blood (routine x 2)     Status: Abnormal   Collection Time: 04/03/22 10:40 AM   Specimen: BLOOD  Result Value Ref Range Status   Specimen Description   Final    BLOOD LEFT ANTECUBITAL Performed at [facility] [address] [place], [place] [address]    Special Requests   Final    BOTTLES DRAWN AEROBIC AND ANAEROBIC Blood Culture adequate volume Performed at [facility] [address], [place], [place] [address]    Culture  Setup Time   Final    GRAM NEGATIVE RODS IN BOTH AEROBIC AND ANAEROBIC BOTTLES CRITICAL RESULT CALLED TO, READ BACK BY AND VERIFIED WITH: M LILLISTON,PHARMD'@0138'$  04/04/22 [place] Performed at [facility], [place] [address] [place], [place] [address]    Culture ESCHERICHIA COLI (A)  Final   Report Status 04/06/2022 FINAL  Final   Organism ID, Bacteria  ESCHERICHIA COLI  Final      Susceptibility   Escherichia coli - MIC*    AMPICILLIN RESISTANT Resistant     CEFAZOLIN 32 INTERMEDIATE Intermediate     CEFEPIME <=0.12 SENSITIVE Sensitive     CEFTAZIDIME <=1 SENSITIVE Sensitive     CEFTRIAXONE <=0.25 SENSITIVE Sensitive     CIPROFLOXACIN <=0.25 SENSITIVE Sensitive     GENTAMICIN <=1 SENSITIVE Sensitive     IMIPENEM <=0.25 SENSITIVE Sensitive  TRIMETH/SULFA <=20 SENSITIVE Sensitive     AMPICILLIN/SULBACTAM <=2 SENSITIVE Sensitive     PIP/TAZO <=4 SENSITIVE Sensitive     * ESCHERICHIA COLI  Blood Culture ID Panel (Reflexed)     Status: Abnormal   Collection Time: 04/03/22 10:40 AM  Result Value Ref Range Status   Enterococcus faecalis NOT DETECTED NOT DETECTED Final   Enterococcus Faecium NOT DETECTED NOT DETECTED Final   Listeria monocytogenes NOT DETECTED NOT DETECTED Final   Staphylococcus species NOT DETECTED NOT DETECTED Final   Staphylococcus aureus (BCID) NOT DETECTED NOT DETECTED Final   Staphylococcus epidermidis NOT DETECTED NOT DETECTED Final   Staphylococcus lugdunensis NOT DETECTED NOT DETECTED Final   Streptococcus species NOT DETECTED NOT DETECTED Final   Streptococcus agalactiae NOT DETECTED NOT DETECTED Final   Streptococcus pneumoniae NOT DETECTED NOT DETECTED Final   Streptococcus pyogenes NOT DETECTED NOT DETECTED Final   A.calcoaceticus-baumannii NOT DETECTED NOT DETECTED Final   Bacteroides fragilis NOT DETECTED NOT DETECTED Final   Enterobacterales DETECTED (A) NOT DETECTED Final    Comment: Enterobacterales represent a large order of gram negative bacteria, not a single organism. CRITICAL RESULT CALLED TO, READ BACK BY AND VERIFIED WITH: M LILLISTON,PHARMD'@0138'$  04/04/22 [place]    Enterobacter cloacae complex NOT DETECTED NOT DETECTED Final   Escherichia coli DETECTED (A) NOT DETECTED Final    Comment: CRITICAL RESULT CALLED TO, READ BACK BY AND VERIFIED WITH: M LILLISTON,PHARMD'@0138'$  04/04/22 [place]     Klebsiella aerogenes NOT DETECTED NOT DETECTED Final   Klebsiella oxytoca NOT DETECTED NOT DETECTED Final   Klebsiella pneumoniae NOT DETECTED NOT DETECTED Final   Proteus species NOT DETECTED NOT DETECTED Final   Salmonella species NOT DETECTED NOT DETECTED Final   Serratia marcescens NOT DETECTED NOT DETECTED Final   Haemophilus influenzae NOT DETECTED NOT DETECTED Final   Neisseria meningitidis NOT DETECTED NOT DETECTED Final   Pseudomonas aeruginosa NOT DETECTED NOT DETECTED Final   Stenotrophomonas maltophilia NOT DETECTED NOT DETECTED Final   Candida albicans NOT DETECTED NOT DETECTED Final   Candida auris NOT DETECTED NOT DETECTED Final   Candida glabrata NOT DETECTED NOT DETECTED Final   Candida krusei NOT DETECTED NOT DETECTED Final   Candida parapsilosis NOT DETECTED NOT DETECTED Final   Candida tropicalis NOT DETECTED NOT DETECTED Final   Cryptococcus neoformans/gattii NOT DETECTED NOT DETECTED Final   CTX-M ESBL NOT DETECTED NOT DETECTED Final   Carbapenem resistance IMP NOT DETECTED NOT DETECTED Final   Carbapenem resistance KPC NOT DETECTED NOT DETECTED Final   Carbapenem resistance NDM NOT DETECTED NOT DETECTED Final   Carbapenem resist OXA 48 LIKE NOT DETECTED NOT DETECTED Final   Carbapenem resistance VIM NOT DETECTED NOT DETECTED Final    Comment: Performed at [facility], [address], [place], [place] [address]  Resp Panel by RT-PCR (Flu A&B, Covid) Anterior Nasal Swab     Status: None   Collection Time: 04/03/22 10:47 AM   Specimen: Anterior Nasal Swab  Result Value Ref Range Status   SARS Coronavirus 2 by RT PCR NEGATIVE NEGATIVE Final    Comment: (NOTE) SARS-CoV-2 target nucleic acids are NOT DETECTED.  The SARS-CoV-2 RNA is generally detectable in upper respiratory specimens during the acute phase of infection. The lowest concentration of SARS-CoV-2 viral copies this assay can detect is 138 copies/mL. A negative result does not preclude  SARS-Cov-2 infection and should not be used as the sole basis for treatment or other patient management decisions. A negative result may occur with  improper specimen collection/handling, submission of specimen other than nasopharyngeal swab, presence of viral mutation(s) within the areas targeted by this assay, and inadequate number of viral copies(<138 copies/mL). A negative result must be combined with clinical observations, patient history, and epidemiological information. The expected result is Negative.  Fact Sheet for Patients:  EntrepreneurPulse.com.au  Fact Sheet for Healthcare Providers:  IncredibleEmployment.be  This test is no t yet approved or cleared by the [place] FDA and  has been authorized for detection and/or diagnosis of SARS-CoV-2 by FDA under an Emergency Use Authorization (EUA). This EUA will remain  in effect (meaning this test can be used) for the duration of the COVID-19 declaration under Section 564(b)(1) of the Act, 21 U.S.C.section 360bbb-3(b)(1), unless the authorization is terminated  or revoked sooner.       Influenza A by PCR NEGATIVE NEGATIVE Final   Influenza B by PCR NEGATIVE NEGATIVE Final    Comment: (NOTE) The Xpert Xpress SARS-CoV-2/FLU/RSV plus assay is intended as an aid in the diagnosis of influenza from Nasopharyngeal swab specimens and should not be used as a sole basis for treatment. Nasal washings and aspirates are unacceptable for Xpert Xpress SARS-CoV-2/FLU/RSV testing.  Fact Sheet for Patients: EntrepreneurPulse.com.au  Fact Sheet for Healthcare Providers: IncredibleEmployment.be  This test is not yet approved or cleared by the [place] FDA and has been authorized for detection and/or diagnosis of SARS-CoV-2 by FDA under an Emergency Use Authorization (EUA). This EUA will remain in effect (meaning this test can be used) for the duration of  the COVID-19 declaration under Section 564(b)(1) of the Act, 21 U.S.C. section 360bbb-3(b)(1), unless the authorization is terminated or revoked.  Performed at [facility], [place] [address] [place], [place] [address]   C Difficile Quick Screen w PCR reflex     Status: None   Collection Time: 04/03/22  2:53 PM   Specimen: Nasal Mucosa; Stool  Result Value Ref Range Status   C Diff antigen NEGATIVE NEGATIVE Final   C Diff toxin NEGATIVE NEGATIVE Final   C Diff interpretation No C. difficile detected.  Final    Comment: Performed at [facility], [place] [address] [place], [place] [address]  Gastrointestinal Panel by PCR , Stool     Status: None   Collection Time: 04/03/22  2:54 PM   Specimen: Nasal Mucosa; Stool  Result Value Ref Range Status   Campylobacter species NOT DETECTED NOT DETECTED Final   Plesimonas shigelloides NOT DETECTED NOT DETECTED Final   Salmonella species NOT DETECTED NOT DETECTED Final   Yersinia enterocolitica NOT DETECTED NOT DETECTED Final   Vibrio species NOT DETECTED NOT DETECTED Final   Vibrio cholerae NOT DETECTED NOT DETECTED Final   Enteroaggregative E coli (EAEC) NOT DETECTED NOT DETECTED Final   Enteropathogenic E coli (EPEC) NOT DETECTED NOT DETECTED Final   Enterotoxigenic E coli (ETEC) NOT DETECTED NOT DETECTED Final   Shiga like toxin producing E coli (STEC) NOT DETECTED NOT DETECTED Final   Shigella/Enteroinvasive E coli (EIEC) NOT DETECTED NOT DETECTED Final   Cryptosporidium NOT DETECTED NOT DETECTED Final   Cyclospora cayetanensis NOT DETECTED NOT DETECTED Final   Entamoeba histolytica NOT DETECTED NOT DETECTED Final   Giardia lamblia NOT DETECTED NOT DETECTED Final   Adenovirus F40/41 NOT DETECTED NOT DETECTED Final   Astrovirus NOT DETECTED NOT DETECTED Final   Norovirus GI/GII NOT DETECTED NOT DETECTED Final   Rotavirus A NOT DETECTED NOT DETECTED Final   Sapovirus (I, II, IV, and  V) NOT DETECTED NOT  DETECTED Final    Comment: Performed at [facility], [address] [place], [place] [address]  Urine Culture     Status: Abnormal   Collection Time: 04/03/22  3:19 PM   Specimen: Urine, Catheterized  Result Value Ref Range Status   Specimen Description   Final    URINE, CATHETERIZED Performed at [facility] [address] [place], [place] [address]    Special Requests   Final    NONE Performed at [facility], [place] [address], [place], [place] [address]    Culture 20,000 COLONIES/mL KLEBSIELLA OXYTOCA (A)  Final   Report Status 04/05/2022 FINAL  Final   Organism ID, Bacteria KLEBSIELLA OXYTOCA (A)  Final      Susceptibility   Klebsiella oxytoca - MIC*    AMPICILLIN >=32 RESISTANT Resistant     CEFAZOLIN <=4 SENSITIVE Sensitive     CEFEPIME <=0.12 SENSITIVE Sensitive     CEFTRIAXONE <=0.25 SENSITIVE Sensitive     CIPROFLOXACIN <=0.25 SENSITIVE Sensitive     GENTAMICIN <=1 SENSITIVE Sensitive     IMIPENEM <=0.25 SENSITIVE Sensitive     NITROFURANTOIN <=16 SENSITIVE Sensitive     TRIMETH/SULFA <=20 SENSITIVE Sensitive     AMPICILLIN/SULBACTAM 8 SENSITIVE Sensitive     PIP/TAZO <=4 SENSITIVE Sensitive     * 20,000 COLONIES/mL KLEBSIELLA OXYTOCA  MRSA Next Gen by PCR, Nasal     Status: None   Collection Time: 04/03/22  8:36 PM   Specimen: Nasal Mucosa; Nasal Swab  Result Value Ref Range Status   MRSA by PCR Next Gen NOT DETECTED NOT DETECTED Final    Comment: (NOTE) The GeneXpert MRSA Assay (FDA approved for NASAL specimens only), is one component of a comprehensive MRSA colonization surveillance program. It is not intended to diagnose MRSA infection nor to guide or monitor treatment for MRSA infections. Test performance is not FDA approved in patients less than 25 years old. Performed at [facility], [place] [address], [place], [place] [address]       Studies:  CT ABDOMEN PELVIS W CONTRAST  Result  Date: 04/08/2022 CLINICAL DATA:  Sepsis, persistent fever, abdominal pain. * Tracking Code: BO * EXAM: CT ABDOMEN AND PELVIS WITH CONTRAST TECHNIQUE: Multidetector CT imaging of the abdomen and pelvis was performed using the standard protocol following bolus administration of intravenous contrast. RADIATION DOSE REDUCTION: This exam was performed according to the departmental dose-optimization program which includes automated exposure control, adjustment of the mA and/or kV according to patient size and/or use of iterative reconstruction technique. CONTRAST:  131m OMNIPAQUE IOHEXOL 300 MG/ML  SOLN COMPARISON:  None Available. FINDINGS: Lower chest: Bibasilar atelectasis small effusions. Hepatobiliary: No focal hepatic lesion. New large volume of fluid surrounds the margin of the liver extends along the RIGHT pericolic gutter. Gallbladder distended without evidence inflammation. No biliary duct dilatation. Portal veins are patent. Pancreas: Pancreas is normal. No ductal dilatation. No pancreatic inflammation. Spleen: Normal spleen Adrenals/urinary tract: Adrenal glands normal. There is mild hydronephrosis hydroureter on the LEFT. Obstructive LEFT ureter likely related to abscess formation in the LEFT pelvis. Stomach/Bowel: Stomach proximal small bowel normal. Contrast flows proximal half way through the small bowel. There is multiple air-fluid levels within the small bowel without significant dilatation. The ascending and transverse colon gas-filled relatively normal. No evidence of colitis seen on comparison exam. Within the LEFT lower quadrant just superior to the operator space is a contained fluid collection which has fluid  and gas measuring 4.5 x 6.7 cm (image 82/2). Favor contained perforation of the bowel at rectosigmoid level small wiht extraluminal gas noted on image 79/2. No significant intraperitoneal free air. There is a large volume intraperitoneal free fluid. Margin of the free fluid is enhancing in  the LEFT pericolic gutter (image 73/7) consistent peritonitis. Again demonstrated a mass at the rectosigmoid junction. Vascular/Lymphatic: Abdominal aorta is normal caliber with intimal calcification. Lymph node deep to the operator space again noted on the LEFT measuring 3.1 x 2.6 cm Reproductive: Grossly unremarkable Other: None Musculoskeletal: No aggressive osseous lesion. IMPRESSION: 1. Gas and fluid collection in the deep LEFT pelvis most consistent with perforation of the rectosigmoid colon with contained abscess formation. 2. New large volume intraperitoneal free fluid. Evidence of peritonitis associated the free fluid. 3. Small bowel ileus. 4. No large volume intraperitoneal free air. 5. New mild hydro nephrosis and hydroureter on the LEFT presumed related to obstruction of LEFT ureter by the LEFT pelvic abscess. Findings conveyed to[facility] on 04/08/2022  at13:59. Electronically Signed   By: Suzy Bouchard M.D.   On: 04/08/2022 14:01   DG Abd 1 View  Result Date: 04/08/2022 CLINICAL DATA:  61 year old female history of abdominal distension. EXAM: ABDOMEN - 1 VIEW COMPARISON:  No prior abdominal radiograph. CT of the abdomen and pelvis 04/03/2022. FINDINGS: Diffuse gaseous distension is noted throughout the small bowel and colon. Small bowel loops measure up to 4.1 cm in the central abdomen. Rectal gas is noted. No definite pneumoperitoneum confidently identified. IMPRESSION: Abnormal bowel-gas pattern, most suggestive of an ileus, as above. Electronically Signed   By: Vinnie Langton M.D.   On: 04/08/2022 07:07    Assessment: 61 y.o. female   Sepsis secondary to Ecoli colitis and bacteremia Acute colitis with sigmoid colon perforation  Metabolic encephalopathy due to sepsis, resolved Left-sided colon cancer, on chemotherapy Normocytic anemia and thrombocytopenia, due to underlying malignancy and chemotherapy Liver cirrhosis, HCV infection   Plan:  -I reviewed her CT scan from  today, which showed a large gas and fluid collection in the deep left pelvis, consistent with perforation of the rectosigmoid colon.  The bowel perforation is probably related to acute colitis, recent chemotherapy and bevacizumab. -I agree with IR drainage tube placement, I spoke with patient and her husband, they agreed to proceed.  I spoke with IR PA Lennette Bihari also. -Infection treatment per ID and primary team  -I told the patient that we have to wait until her abscess is cleared, I will hold on her chemotherapy for the next month or longer, and she would not be a candidate for more bevacizumab due to the bowel perforation. -I will f/u as needed before discharge    Truitt Merle, MD 04/08/2022

## 2022-04-08 NOTE — Progress Notes (Addendum)
Assisted patient to [facility] to attempt void, but patient was unable to void more than one drop.  Patient denies need to urinate.  Bladder scan showed 120 cc.  MD notified.  Angie Fava, RN

## 2022-04-08 NOTE — Progress Notes (Signed)
Patient appeared to be confused. GCS 14 [id number]. Vital signs were taken. She complained too of abdominal pain lower. EKG and blood sugars were taken. Rapid response was notified and checked the patient. Bladder scan was done and charted 346m of retained urine. In and out cath was ordered. Continued to closely monitor the patients status.

## 2022-04-08 NOTE — Progress Notes (Addendum)
Referring Physician(s): Gross,S  Supervising Physician: Corrie Mckusick  Patient Status:  [facility] - In-pt  Chief Complaint:  Abdominal/pelvic pain/abscesses  Subjective: Patient known to IR service from paracentesis in 2022.  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  She was admitted to [facility] on 8/17 with abdominal pain and mild confusion as well as some loose stools and sepsis protocol with E. coli and Citrobacter bacteremia. Latest CT A/P today revealed:  1. Gas and fluid collection in the deep LEFT pelvis most consistent with perforation of the rectosigmoid colon with contained abscess formation. 2. New large volume intraperitoneal free fluid. Evidence of peritonitis associated the free fluid. 3. Small bowel ileus. 4. No large volume intraperitoneal free air. 5. New mild hydro nephrosis and hydroureter on the LEFT presumed related to obstruction of LEFT ureter by the LEFT pelvic abscess  She is currently afebrile, PT 29.1, INR 2.8, lactic acid 2, creatinine 0.56, total bilirubin 2.4, WBC 16.8, hemoglobin 11.7, platelets 92k.  Urine culture with Klebsiella.  Request now received from surgical team for image guided aspiration/drainage of abdominal/pelvic abscesses/fluid collection.   Past Medical History:  Diagnosis Date   Colorectal cancer ([place])    Family history of brain cancer    Family history of prostate cancer    Family history of stomach cancer    Heart murmur    dx at 26   MVP is stable   Hemorrhoids    Past Surgical History:  Procedure Laterality Date   NO PAST SURGERIES       Allergies: Codeine, Epinephrine (anaphylaxis), Orange fruit [citrus], Streptogramins, and Tomato  Medications: Prior to Admission medications   Medication Sig Start Date End Date Taking? Authorizing Provider  acetaminophen (TYLENOL) 325 MG tablet Take 650 mg by mouth every 6 (six) hours as needed for moderate pain.   Yes  [provider]  capecitabine (XELODA) 500 MG tablet TAKE 2 TABLETS EVERY 12 HOURS  FOR 14 DAYS ON THEN 7 DAYS OFF, TAKE WITH MEALS Patient taking differently: Take 1,000 mg/m2 by mouth 2 (two) times daily after a meal. 03/24/22  Yes Truitt Merle, MD  cholecalciferol (VITAMIN D3) 25 MCG (1000 UNIT) tablet Take 1,000 Units by mouth daily.   Yes [provider]  ELIQUIS 5 MG TABS tablet TAKE 1 TABLET BY MOUTH TWICE A DAY Patient taking differently: Take 5 mg by mouth 2 (two) times daily. 03/24/22  Yes Truitt Merle, MD  KLOR-CON M20 20 MEQ tablet TAKE 1 TABLET BY MOUTH TWICE A DAY Patient taking differently: Take 20 mEq by mouth daily. 12/13/21  Yes Truitt Merle, MD  loperamide (IMODIUM) 2 MG capsule Take 1 capsule (2 mg total) by mouth as needed for diarrhea or loose stools. Patient taking differently: Take 2 mg by mouth daily as needed for diarrhea or loose stools. 05/27/21  Yes Nicole Kindred A, DO  ondansetron (ZOFRAN) 8 MG tablet Take 1 tablet (8 mg total) by mouth 2 (two) times daily as needed for refractory nausea / vomiting. Start on day 3 after chemotherapy. Patient taking differently: Take 8 mg by mouth 2 (two) times daily as needed for refractory nausea / vomiting. 03/06/21  Yes Truitt Merle, MD  diphenoxylate-atropine (LOMOTIL) 2.5-0.025 MG tablet Take 2 tablets by mouth 4 (four) times daily as needed for diarrhea or loose stools. Patient not taking: Reported on 04/03/2022 01/06/22   Truitt Merle, MD     Vital Signs: BP 103/74 (BP  Location: Right Arm)   Pulse 100   Temp 97.7 F (36.5 C) (Oral)   Resp 18   Ht '5\' 4"'$  (1.626 m)   Wt 108 lb 11 oz (49.3 kg)   SpO2 99%   BMI 18.66 kg/m   Physical Exam awake, answering simple questions okay.  Husband in room.  Chest with slightly diminished breath sounds at bases.  Heart with slightly tachycardic but regular rhythm.  Abdomen mild to moderately distended, few bowel sounds, some tenderness right lower quadrant.  Some trace pretibial edema  bilaterally, more so on the left.  Imaging: CT ABDOMEN PELVIS W CONTRAST  Result Date: 04/08/2022 CLINICAL DATA:  Sepsis, persistent fever, abdominal pain. * Tracking Code: BO * EXAM: CT ABDOMEN AND PELVIS WITH CONTRAST TECHNIQUE: Multidetector CT imaging of the abdomen and pelvis was performed using the standard protocol following bolus administration of intravenous contrast. RADIATION DOSE REDUCTION: This exam was performed according to the departmental dose-optimization program which includes automated exposure control, adjustment of the mA and/or kV according to patient size and/or use of iterative reconstruction technique. CONTRAST:  163m OMNIPAQUE IOHEXOL 300 MG/ML  SOLN COMPARISON:  None Available. FINDINGS: Lower chest: Bibasilar atelectasis small effusions. Hepatobiliary: No focal hepatic lesion. New large volume of fluid surrounds the margin of the liver extends along the RIGHT pericolic gutter. Gallbladder distended without evidence inflammation. No biliary duct dilatation. Portal veins are patent. Pancreas: Pancreas is normal. No ductal dilatation. No pancreatic inflammation. Spleen: Normal spleen Adrenals/urinary tract: Adrenal glands normal. There is mild hydronephrosis hydroureter on the LEFT. Obstructive LEFT ureter likely related to abscess formation in the LEFT pelvis. Stomach/Bowel: Stomach proximal small bowel normal. Contrast flows proximal half way through the small bowel. There is multiple air-fluid levels within the small bowel without significant dilatation. The ascending and transverse colon gas-filled relatively normal. No evidence of colitis seen on comparison exam. Within the LEFT lower quadrant just superior to the operator space is a contained fluid collection which has fluid and gas measuring 4.5 x 6.7 cm (image 82/2). Favor contained perforation of the bowel at rectosigmoid level small wiht extraluminal gas noted on image 79/2. No significant intraperitoneal free air. There is  a large volume intraperitoneal free fluid. Margin of the free fluid is enhancing in the LEFT pericolic gutter (image 563/8 consistent peritonitis. Again demonstrated a mass at the rectosigmoid junction. Vascular/Lymphatic: Abdominal aorta is normal caliber with intimal calcification. Lymph node deep to the operator space again noted on the LEFT measuring 3.1 x 2.6 cm Reproductive: Grossly unremarkable Other: None Musculoskeletal: No aggressive osseous lesion. IMPRESSION: 1. Gas and fluid collection in the deep LEFT pelvis most consistent with perforation of the rectosigmoid colon with contained abscess formation. 2. New large volume intraperitoneal free fluid. Evidence of peritonitis associated the free fluid. 3. Small bowel ileus. 4. No large volume intraperitoneal free air. 5. New mild hydro nephrosis and hydroureter on the LEFT presumed related to obstruction of LEFT ureter by the LEFT pelvic abscess. Findings [facility] 04/08/2022  at13:59. Electronically Signed   By: SSuzy BouchardM.D.   On: 04/08/2022 14:01   DG Abd 1 View  Result Date: 04/08/2022 CLINICAL DATA:  61year old female history of abdominal distension. EXAM: ABDOMEN - 1 VIEW COMPARISON:  No prior abdominal radiograph. CT of the abdomen and pelvis 04/03/2022. FINDINGS: Diffuse gaseous distension is noted throughout the small bowel and colon. Small bowel loops measure up to 4.1 cm in the central abdomen. Rectal gas is noted. No definite  pneumoperitoneum confidently identified. IMPRESSION: Abnormal bowel-gas pattern, most suggestive of an ileus, as above. Electronically Signed   By: Vinnie Langton M.D.   On: 04/08/2022 07:07   ECHOCARDIOGRAM COMPLETE  Result Date: 04/06/2022    ECHOCARDIOGRAM REPORT   Patient Name:   Angela Mcpherson Date of Exam: 04/06/2022 Medical Rec #:  [id number]      Height:       64.0 in Accession #:    [id number]     Weight:       108.7 lb Date of Birth:  1960-11-22       BSA:          1.510 m Patient  Age:    61 years       BP:           129/85 mmHg Patient Gender: F              HR:           116 bpm. Exam Location:  Inpatient Procedure: 2D Echo, Cardiac Doppler, Color Doppler and 3D Echo Indications:    Elevated troponins  History:        Patient has no prior history of Echocardiogram examinations.                 Arrythmias:Tachycardia.  Sonographer:    Merrie Roof RDCS Referring Phys: [place]  1. Left ventricular ejection fraction, by estimation, is 55 to 60%. Left ventricular ejection fraction by 3D volume is 63 %. The left ventricle has normal function. The left ventricle has no regional wall motion abnormalities.  2. Right ventricular systolic function is normal. The right ventricular size is normal. There is normal pulmonary artery systolic pressure. The estimated right ventricular systolic pressure is 48.1 mmHg.  3. Left atrial size was mildly dilated.  4. The mitral valve is normal in structure. No evidence of mitral valve regurgitation.  5. The aortic valve is tricuspid. There is mild calcification of the aortic valve. There is mild thickening of the aortic valve. Aortic valve regurgitation is not visualized. Aortic valve sclerosis/calcification is present, without any evidence of aortic stenosis.  6. The inferior vena cava is collapsed, consistent with low right atrial pressure. Conclusion(s)/Recommendation(s): Echo findings suggest hypovolemia. FINDINGS  Left Ventricle: Impaired relaxation pattern may be due to hypovolemia (diastolic mitral annulus velocities are normal for age). Left ventricular ejection fraction, by estimation, is 55 to 60%. Left ventricular ejection fraction by 3D volume is 63 %. The  left ventricle has normal function. The left ventricle has no regional wall motion abnormalities. The left ventricular internal cavity size was normal in size. There is no left ventricular hypertrophy. Right Ventricle: The right ventricular size is normal. No increase in right  ventricular wall thickness. Right ventricular systolic function is normal. There is normal pulmonary artery systolic pressure. The tricuspid regurgitant velocity is 2.59 m/s, and  with an assumed right atrial pressure of 0 mmHg, the estimated right ventricular systolic pressure is 85.6 mmHg. Left Atrium: Left atrial size was mildly dilated. Right Atrium: Right atrial size was normal in size. Pericardium: There is no evidence of pericardial effusion. Mitral Valve: The mitral valve is normal in structure. No evidence of mitral valve regurgitation. Tricuspid Valve: The tricuspid valve is normal in structure. Tricuspid valve regurgitation is trivial. Aortic Valve: The aortic valve is tricuspid. There is mild calcification of the aortic valve. There is mild thickening of the aortic valve. Aortic valve regurgitation is not visualized. Aortic valve  sclerosis/calcification is present, without any evidence of aortic stenosis. Aortic valve mean gradient measures 10.3 mmHg. Aortic valve peak gradient measures 18.6 mmHg. Aortic valve area, by VTI measures 1.91 cm. Pulmonic Valve: The pulmonic valve was not well visualized. Pulmonic valve regurgitation is not visualized. Aorta: The aortic root and ascending aorta are structurally normal, with no evidence of dilitation. Venous: The inferior vena cava is collapsed, consistent with low right atrial pressure. IAS/Shunts: The interatrial septum was not well visualized.  LEFT VENTRICLE PLAX 2D LVIDd:         4.30 cm   Diastology LVIDs:         3.10 cm   LV e' medial:    7.29 cm/s LV PW:         1.00 cm   LV E/e' medial:  10.8 LV IVS:        1.10 cm   LV e' lateral:   10.70 cm/s LVOT diam:     2.00 cm   LV E/e' lateral: 7.4 LV SV:         62 LV SV Index:   41 LVOT Area:     3.14 cm                           3D Volume EF:                          3D EF:        63 %                          LV EDV:       103 ml                          LV ESV:       38 ml                          LV  SV:        65 ml RIGHT VENTRICLE RV Basal diam:  2.60 cm RV S prime:     11.40 cm/s TAPSE (M-mode): 2.6 cm LEFT ATRIUM           Index        RIGHT ATRIUM           Index LA diam:      2.90 cm 1.92 cm/m   RA Area:     14.30 cm LA Vol (A2C): 38.7 ml 25.63 ml/m  RA Volume:   30.20 ml  20.00 ml/m LA Vol (A4C): 52.0 ml 34.44 ml/m  AORTIC VALVE AV Area (Vmax):    2.01 cm AV Area (Vmean):   1.91 cm AV Area (VTI):     1.91 cm AV Vmax:           215.67 cm/s AV Vmean:          150.000 cm/s AV VTI:            0.324 m AV Peak Grad:      18.6 mmHg AV Mean Grad:      10.3 mmHg LVOT Vmax:         138.00 cm/s LVOT Vmean:        91.200 cm/s LVOT VTI:          0.197 m LVOT/AV VTI ratio: 0.61  AORTA Ao Root diam: 3.00 cm Ao Asc diam:  2.70 cm MITRAL VALVE               TRICUSPID VALVE MV Area (PHT): 4.71 cm    TR Peak grad:   26.8 mmHg MV Decel Time: 161 msec    TR Vmax:        259.00 cm/s MV E velocity: 78.80 cm/s MV A velocity: 98.10 cm/s  SHUNTS MV E/A ratio:  0.80        Systemic VTI:  0.20 m                            Systemic Diam: 2.00 cm Mihai Croitoru MD Electronically signed by Sanda Klein MD Signature Date/Time: 04/06/2022/1:20:24 PM    Final     Labs:  CBC: Recent Labs    04/05/22 0502 04/06/22 0352 04/07/22 0332 04/08/22 0419  WBC 8.1 12.0* 16.6* 16.8*  HGB 11.5* 10.0* 10.5* 11.7*  HCT 32.7* 28.9* 30.5* 34.1*  PLT 56* 46* 60* 92*    COAGS: Recent Labs    05/18/21 2044 05/19/21 0618 04/03/22 1035 04/04/22 0336 04/08/22 1517  INR 1.3* 1.5* 3.0* 4.0* 2.8*  APTT 24  --   --   --   --     BMP: Recent Labs    04/05/22 0502 04/06/22 0352 04/07/22 0332 04/08/22 0419  NA 137 136 136 133*  K 2.9* 3.8 3.8 3.6  CL 109 111 112* 107  CO2 23 22 20* 20*  GLUCOSE 167* 116* 120* 96  BUN '19 17 15 13  '$ CALCIUM 8.4* 7.9* 7.7* 7.9*  CREATININE 0.76 0.70 0.63 0.56  GFRNONAA >60 >60 >60 >60    LIVER FUNCTION TESTS: Recent Labs    04/05/22 0502 04/05/22 1016 04/06/22 0352  04/07/22 0332 04/08/22 0419  BILITOT 1.6* 1.6* 1.8* 1.9* 2.4*  AST 32  --  32 42* 54*  ALT 19  --  22 28 33  ALKPHOS 91  --  72 81 95  PROT 5.1*  --  4.5* 4.6* 4.7*  ALBUMIN 2.4*  --  2.1* 2.1* 2.0*    Assessment and Plan: Patient known to IR service from paracentesis in 2022.  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  She was admitted to [facility] on 8/17 with abdominal pain and mild confusion as well as some loose stools and sepsis protocol with E. coli and Citrobacter bacteremia. Latest CT A/P today revealed:  1. Gas and fluid collection in the deep LEFT pelvis most consistent with perforation of the rectosigmoid colon with contained abscess formation. 2. New large volume intraperitoneal free fluid. Evidence of peritonitis associated the free fluid. 3. Small bowel ileus. 4. No large volume intraperitoneal free air. 5. New mild hydro nephrosis and hydroureter on the LEFT presumed related to obstruction of LEFT ureter by the LEFT pelvic abscess  She is currently afebrile, PT 29.1, INR 2.8, lactic acid 2, creatinine 0.56, total bilirubin 2.4, WBC 16.8, hemoglobin 11.7, platelets 92k.  Urine culture with Klebsiella.  Request now received from surgical team for image guided aspiration/drainage of abdominal/pelvic abscesses/fluid collection.  Imaging studies have been reviewed by Dr. Dwaine Gale.  Case discussed with Drs. Gross and Burr Medico.  With elevated INR we will ask TRH to administer vitamin K and recheck pro time in a.m.  Patient may require FFP as well.  Tentative plans are to proceed on  8/23 with case if possible.Risks and benefits discussed with the patient /spouse including bleeding, infection, damage to adjacent structures, bowel perforation/fistula connection, and sepsis.  All of the patient's questions were answered, patient is agreeable to proceed. Consent signed and in chart.    Electronically Signed: D. Rowe Robert,  PA-C 04/08/2022, 3:59 PM   I spent a total of 25 Minutes at the the patient's bedside AND on the patient's hospital floor or unit, greater than 50% of which was counseling/coordinating care for CT-guided aspiration/drainage of abdominal/pelvic abscess/fluid collections    Patient ID: Laurette Villescas, female   DOB: [birth year], 61 y.o.   MRN: [id number]

## 2022-04-09 ENCOUNTER — Inpatient Hospital Stay (HOSPITAL_COMMUNITY): Payer: 59

## 2022-04-09 ENCOUNTER — Other Ambulatory Visit: Payer: Self-pay

## 2022-04-09 DIAGNOSIS — B182 Chronic viral hepatitis C: Secondary | ICD-10-CM | POA: Diagnosis not present

## 2022-04-09 DIAGNOSIS — I7410 Embolism and thrombosis of unspecified parts of aorta: Secondary | ICD-10-CM

## 2022-04-09 DIAGNOSIS — K7460 Unspecified cirrhosis of liver: Secondary | ICD-10-CM

## 2022-04-09 DIAGNOSIS — R7881 Bacteremia: Secondary | ICD-10-CM | POA: Diagnosis not present

## 2022-04-09 DIAGNOSIS — R338 Other retention of urine: Secondary | ICD-10-CM | POA: Diagnosis not present

## 2022-04-09 DIAGNOSIS — A419 Sepsis, unspecified organism: Secondary | ICD-10-CM | POA: Diagnosis not present

## 2022-04-09 DIAGNOSIS — G9341 Metabolic encephalopathy: Secondary | ICD-10-CM | POA: Diagnosis not present

## 2022-04-09 DIAGNOSIS — K529 Noninfective gastroenteritis and colitis, unspecified: Secondary | ICD-10-CM | POA: Diagnosis not present

## 2022-04-09 DIAGNOSIS — E876 Hypokalemia: Secondary | ICD-10-CM

## 2022-04-09 DIAGNOSIS — D689 Coagulation defect, unspecified: Secondary | ICD-10-CM

## 2022-04-09 LAB — PROTIME-INR
INR: 2.2 — ABNORMAL HIGH (ref 0.8–1.2)
Prothrombin Time: 24.5 seconds — ABNORMAL HIGH (ref 11.4–15.2)

## 2022-04-09 LAB — CBC WITH DIFFERENTIAL/PLATELET
Abs Immature Granulocytes: 0.28 10*3/uL — ABNORMAL HIGH (ref 0.00–0.07)
Basophils Absolute: 0.1 10*3/uL (ref 0.0–0.1)
Basophils Relative: 0 %
Eosinophils Absolute: 0 10*3/uL (ref 0.0–0.5)
Eosinophils Relative: 0 %
HCT: 27.3 % — ABNORMAL LOW (ref 36.0–46.0)
Hemoglobin: 9.4 g/dL — ABNORMAL LOW (ref 12.0–15.0)
Immature Granulocytes: 1 %
Lymphocytes Relative: 6 %
Lymphs Abs: 1.3 10*3/uL (ref 0.7–4.0)
MCH: 34.3 pg — ABNORMAL HIGH (ref 26.0–34.0)
MCHC: 34.4 g/dL (ref 30.0–36.0)
MCV: 99.6 fL (ref 80.0–100.0)
Monocytes Absolute: 0.5 10*3/uL (ref 0.1–1.0)
Monocytes Relative: 2 %
Neutro Abs: 19.2 10*3/uL — ABNORMAL HIGH (ref 1.7–7.7)
Neutrophils Relative %: 91 %
Platelets: 84 10*3/uL — ABNORMAL LOW (ref 150–400)
RBC: 2.74 MIL/uL — ABNORMAL LOW (ref 3.87–5.11)
RDW: 24.4 % — ABNORMAL HIGH (ref 11.5–15.5)
WBC: 21.3 10*3/uL — ABNORMAL HIGH (ref 4.0–10.5)
nRBC: 0 % (ref 0.0–0.2)

## 2022-04-09 LAB — FERRITIN: Ferritin: 220 ng/mL (ref 11–307)

## 2022-04-09 LAB — IRON AND TIBC
Iron: 18 ug/dL — ABNORMAL LOW (ref 28–170)
Saturation Ratios: 11 % (ref 10.4–31.8)
TIBC: 172 ug/dL — ABNORMAL LOW (ref 250–450)
UIBC: 154 ug/dL

## 2022-04-09 LAB — HEPATITIS PANEL, ACUTE
HCV Ab: REACTIVE — AB
Hep A IgM: NONREACTIVE
Hep B C IgM: NONREACTIVE
Hepatitis B Surface Ag: NONREACTIVE

## 2022-04-09 LAB — COMPREHENSIVE METABOLIC PANEL
ALT: 25 U/L (ref 0–44)
AST: 32 U/L (ref 15–41)
Albumin: 1.9 g/dL — ABNORMAL LOW (ref 3.5–5.0)
Alkaline Phosphatase: 82 U/L (ref 38–126)
Anion gap: 8 (ref 5–15)
BUN: 11 mg/dL (ref 6–20)
CO2: 20 mmol/L — ABNORMAL LOW (ref 22–32)
Calcium: 7.7 mg/dL — ABNORMAL LOW (ref 8.9–10.3)
Chloride: 107 mmol/L (ref 98–111)
Creatinine, Ser: 0.54 mg/dL (ref 0.44–1.00)
GFR, Estimated: >60 mL/min (ref >60)
Glucose, Bld: 111 mg/dL — ABNORMAL HIGH (ref 70–99)
Potassium: 2.6 mmol/L — CL (ref 3.5–5.1)
Sodium: 135 mmol/L (ref 135–145)
Total Bilirubin: 2.1 mg/dL — ABNORMAL HIGH (ref 0.3–1.2)
Total Protein: 4.7 g/dL — ABNORMAL LOW (ref 6.5–8.1)

## 2022-04-09 LAB — FOLATE: Folate: >40 ng/mL (ref >5.9)

## 2022-04-09 LAB — RETICULOCYTES
Immature Retic Fract: 29.6 % — ABNORMAL HIGH (ref 2.3–15.9)
RBC.: 2.7 MIL/uL — ABNORMAL LOW (ref 3.87–5.11)
Retic Count, Absolute: 95.3 10*3/uL (ref 19.0–186.0)
Retic Ct Pct: 3.5 % — ABNORMAL HIGH (ref 0.4–3.1)

## 2022-04-09 LAB — VITAMIN B12: Vitamin B-12: 1530 pg/mL — ABNORMAL HIGH (ref 180–914)

## 2022-04-09 LAB — PROCALCITONIN: Procalcitonin: 6.3 ng/mL

## 2022-04-09 LAB — POTASSIUM: Potassium: 3.2 mmol/L — ABNORMAL LOW (ref 3.5–5.1)

## 2022-04-09 MED ORDER — MIDAZOLAM HCL 2 MG/2ML IJ SOLN
INTRAMUSCULAR | Status: AC | PRN
  Administered 2022-04-09: .5 mg via INTRAVENOUS
  Administered 2022-04-09: 1 mg via INTRAVENOUS
  Administered 2022-04-09: .5 mg via INTRAVENOUS

## 2022-04-09 MED ORDER — POTASSIUM CHLORIDE 10 MEQ/100ML IV SOLN
10.0000 meq | INTRAVENOUS | Status: AC
  Administered 2022-04-09 (×4): 10 meq via INTRAVENOUS
  Filled 2022-04-09 (×4): qty 100

## 2022-04-09 MED ORDER — POTASSIUM CHLORIDE IN NACL 20-0.9 MEQ/L-% IV SOLN
INTRAVENOUS | Status: AC
  Filled 2022-04-09: qty 1000

## 2022-04-09 MED ORDER — FENTANYL CITRATE PF 50 MCG/ML IJ SOSY
25.0000 ug | PREFILLED_SYRINGE | Freq: Once | INTRAMUSCULAR | Status: AC
  Administered 2022-04-09: 25 ug via INTRAVENOUS
  Filled 2022-04-09: qty 1

## 2022-04-09 MED ORDER — SODIUM CHLORIDE 0.9 % IV SOLN
INTRAVENOUS | Status: AC
  Filled 2022-04-09: qty 1000

## 2022-04-09 MED ORDER — FENTANYL CITRATE (PF) 100 MCG/2ML IJ SOLN
INTRAMUSCULAR | Status: AC | PRN
  Administered 2022-04-09: 50 ug via INTRAVENOUS
  Administered 2022-04-09 (×2): 25 ug via INTRAVENOUS
  Administered 2022-04-09: 50 ug via INTRAVENOUS

## 2022-04-09 MED ORDER — SODIUM CHLORIDE 0.9% FLUSH
5.0000 mL | Freq: Three times a day (TID) | INTRAVENOUS | Status: DC
  Administered 2022-04-09 – 2022-04-14 (×13): 5 mL

## 2022-04-09 MED ORDER — SODIUM CHLORIDE 0.9 % IV BOLUS
500.0000 mL | Freq: Once | INTRAVENOUS | Status: AC
  Administered 2022-04-09: 500 mL via INTRAVENOUS

## 2022-04-09 MED ORDER — MIDAZOLAM HCL 2 MG/2ML IJ SOLN
INTRAMUSCULAR | Status: AC
  Filled 2022-04-09: qty 4

## 2022-04-09 MED ORDER — FENTANYL CITRATE (PF) 100 MCG/2ML IJ SOLN
INTRAMUSCULAR | Status: AC
  Filled 2022-04-09: qty 4

## 2022-04-09 NOTE — Progress Notes (Signed)
   04/09/22 1644  Assess: MEWS Score  Temp 97.8 F (36.6 C)  BP 105/65  MAP (mmHg) 77  Pulse Rate (!) 116  ECG Heart Rate (!) 118  Resp 18  Level of Consciousness Alert  SpO2 98 %  O2 Device Room Air  Assess: MEWS Score  MEWS Temp 0  MEWS Systolic 0  MEWS Pulse 2  MEWS RR 0  MEWS LOC 0  MEWS Score 2  MEWS Score Color Yellow  Assess: if the MEWS score is Yellow or Red  Were vital signs taken at a resting state? Yes  Focused Assessment No change from prior assessment  Does the patient meet 2 or more of the SIRS criteria? Yes  Does the patient have a confirmed or suspected source of infection? Yes  Provider and Rapid Response Notified? No  Treat  MEWS Interventions Administered scheduled meds/treatments  Pain Scale 0-10  Pain Score Asleep  Take Vital Signs  Increase Vital Sign Frequency  Yellow: Q 2hr X 2 then Q 4hr X 2, if remains yellow, continue Q 4hrs  Escalate  MEWS: Escalate Yellow: discuss with charge nurse/RN and consider discussing with provider and RRT  Notify: Charge Nurse/RN  Name of Charge Nurse/RN Notified Sharyn Blitz  Date Charge Nurse/RN Notified 04/09/22  Time Charge Nurse/RN Notified 1646  Document  Patient Outcome Stabilized after interventions  Progress note created (see row info) Yes  Assess: SIRS CRITERIA  SIRS Temperature  0  SIRS Pulse 1  SIRS Respirations  0  SIRS WBC 1  SIRS Score Sum  2

## 2022-04-09 NOTE — Assessment & Plan Note (Addendum)
Hgb stable 

## 2022-04-09 NOTE — Assessment & Plan Note (Addendum)
Severe malnutrition ruled out.

## 2022-04-09 NOTE — Progress Notes (Signed)
[facility]  Date of Admission:  04/03/2022           Reason for visit: Follow up on bacteremia   Current antibiotics: Cefepime Metronidazole  ASSESSMENT:    61 y.o. female admitted with:  Colitis and secondary Citrobacter and E. coli bacteremia: Complicated with CT evidence of gas and fluid in the left pelvis consistent with perforation of the rectosigmoid colon with contained abscess formation and peritonitis.  Perforation likely related to colitis, recent chemotherapy, and bevacizumab. Metastatic colorectal cancer: Currently on chemotherapy with last dose approximately 1 week prior to admission.  Seen this admission by Dr. Burr Medico who has recommended holding chemotherapy until abscess has cleared. Urinary retention and left-sided hydroureteronephrosis: Likely related to obstruction from left pelvic abscess.  Foley catheter replaced yesterday. Sepsis secondary to #1. Positive HCV antibody: Noted in February 2023.  HCV RNA pending.  RECOMMENDATIONS:    Continue cefepime Continue metronidazole Follow repeat blood cultures Follow-up plans for IR drainage of abscess Will follow.  Dr. Candiss Norse will be back tomorrow   Principal Problem:   E coli bacteremia Active Problems:   Malignant neoplasm of rectosigmoid junction (HCC)   Acute encephalopathy   Aortic mural thrombus (HCC)   Thrombocytopenia (HCC)   Protein-calorie malnutrition, severe   Hepatitis C   Sepsis (HCC)   Normocytic anemia   Elevated troponin   Acute urinary retention   Malnutrition of moderate degree   Pelvic abscess in female   Hepatic cirrhosis (HCC)    MEDICATIONS:    Scheduled Meds:  Chlorhexidine Gluconate Cloth  6 each Topical Q0600   feeding supplement  1 Container Oral Q24H   feeding supplement  237 mL Oral BID BM   multivitamin with minerals  1 tablet Oral Daily   Continuous Infusions:  0.9 % NaCl with KCl 20 mEq / L     ceFEPime (MAXIPIME) IV 2 g (04/08/22  2136)   metronidazole 500 mg (04/09/22 0954)   potassium chloride 10 mEq (04/09/22 0952)   PRN Meds:.acetaminophen **OR** acetaminophen, oxyCODONE, prochlorperazine  SUBJECTIVE:   24 hour events:  No acute event events noted overnight No further fevers over the last 24 hours INR 2.2 Hepatitis labs ordered WBC increased to 21.3 Platelets stable Hemoglobin 9.4. Potassium 2.6, LFTs normal, T. bili 2.1  Patient seen about to work with therapy this morning.  She would really like something to eat or drink and reports that her mouth is dry.  Review of Systems  All other systems reviewed and are negative.     OBJECTIVE:   Blood pressure 110/68, pulse (!) 105, temperature 97.7 F (36.5 C), resp. rate 19, height 5' 4"  (1.626 m), weight 49.3 kg, SpO2 99 %. Body mass index is 18.66 kg/m.  Physical Exam Constitutional:      Appearance: Normal appearance.  HENT:     Head: Normocephalic and atraumatic.  Eyes:     Extraocular Movements: Extraocular movements intact.     Conjunctiva/sclera: Conjunctivae normal.  Pulmonary:     Effort: Pulmonary effort is normal. No respiratory distress.  Musculoskeletal:        General: Normal range of motion.  Skin:    General: Skin is warm and dry.  Neurological:     General: No focal deficit present.     Mental Status: She is alert and oriented to person, place, and time.  Psychiatric:        Mood and Affect: Mood normal.  Behavior: Behavior normal.      Lab Results: Lab Results  Component Value Date   WBC 21.3 (H) 04/09/2022   HGB 9.4 (L) 04/09/2022   HCT 27.3 (L) 04/09/2022   MCV 99.6 04/09/2022   PLT 84 (L) 04/09/2022    Lab Results  Component Value Date   NA 135 04/09/2022   K 2.6 (LL) 04/09/2022   CO2 20 (L) 04/09/2022   GLUCOSE 111 (H) 04/09/2022   BUN 11 04/09/2022   CREATININE 0.54 04/09/2022   CALCIUM 7.7 (L) 04/09/2022   GFRNONAA >60 04/09/2022   GFRAA  06/26/2010    >60        The eGFR has been  calculated using the MDRD equation. This calculation has not been validated in all clinical situations. eGFR's persistently <60 mL/min signify possible Chronic Kidney Disease.    Lab Results  Component Value Date   ALT 25 04/09/2022   AST 32 04/09/2022   ALKPHOS 82 04/09/2022   BILITOT 2.1 (H) 04/09/2022    No results found for: "CRP"  No results found for: "ESRSEDRATE"   I have reviewed the micro and lab results in Epic.  Imaging: CT ABDOMEN PELVIS W CONTRAST  Result Date: 04/08/2022 CLINICAL DATA:  Sepsis, persistent fever, abdominal pain. * Tracking Code: BO * EXAM: CT ABDOMEN AND PELVIS WITH CONTRAST TECHNIQUE: Multidetector CT imaging of the abdomen and pelvis was performed using the standard protocol following bolus administration of intravenous contrast. RADIATION DOSE REDUCTION: This exam was performed according to the departmental dose-optimization program which includes automated exposure control, adjustment of the mA and/or kV according to patient size and/or use of iterative reconstruction technique. CONTRAST:  189m OMNIPAQUE IOHEXOL 300 MG/ML  SOLN COMPARISON:  None Available. FINDINGS: Lower chest: Bibasilar atelectasis small effusions. Hepatobiliary: No focal hepatic lesion. New large volume of fluid surrounds the margin of the liver extends along the RIGHT pericolic gutter. Gallbladder distended without evidence inflammation. No biliary duct dilatation. Portal veins are patent. Pancreas: Pancreas is normal. No ductal dilatation. No pancreatic inflammation. Spleen: Normal spleen Adrenals/urinary tract: Adrenal glands normal. There is mild hydronephrosis hydroureter on the LEFT. Obstructive LEFT ureter likely related to abscess formation in the LEFT pelvis. Stomach/Bowel: Stomach proximal small bowel normal. Contrast flows proximal half way through the small bowel. There is multiple air-fluid levels within the small bowel without significant dilatation. The ascending and  transverse colon gas-filled relatively normal. No evidence of colitis seen on comparison exam. Within the LEFT lower quadrant just superior to the operator space is a contained fluid collection which has fluid and gas measuring 4.5 x 6.7 cm (image 82/2). Favor contained perforation of the bowel at rectosigmoid level small wiht extraluminal gas noted on image 79/2. No significant intraperitoneal free air. There is a large volume intraperitoneal free fluid. Margin of the free fluid is enhancing in the LEFT pericolic gutter (image 543/1 consistent peritonitis. Again demonstrated a mass at the rectosigmoid junction. Vascular/Lymphatic: Abdominal aorta is normal caliber with intimal calcification. Lymph node deep to the operator space again noted on the LEFT measuring 3.1 x 2.6 cm Reproductive: Grossly unremarkable Other: None Musculoskeletal: No aggressive osseous lesion. IMPRESSION: 1. Gas and fluid collection in the deep LEFT pelvis most consistent with perforation of the rectosigmoid colon with contained abscess formation. 2. New large volume intraperitoneal free fluid. Evidence of peritonitis associated the free fluid. 3. Small bowel ileus. 4. No large volume intraperitoneal free air. 5. New mild hydro nephrosis and hydroureter on the LEFT presumed related  to obstruction of LEFT ureter by the LEFT pelvic abscess. [facility] 04/08/2022  at13:59. Electronically Signed   By: Suzy Bouchard M.D.   On: 04/08/2022 14:01   DG Abd 1 View  Result Date: 04/08/2022 CLINICAL DATA:  61 year old female history of abdominal distension. EXAM: ABDOMEN - 1 VIEW COMPARISON:  No prior abdominal radiograph. CT of the abdomen and pelvis 04/03/2022. FINDINGS: Diffuse gaseous distension is noted throughout the small bowel and colon. Small bowel loops measure up to 4.1 cm in the central abdomen. Rectal gas is noted. No definite pneumoperitoneum confidently identified. IMPRESSION: Abnormal bowel-gas pattern,  most suggestive of an ileus, as above. Electronically Signed   By: Vinnie Langton M.D.   On: 04/08/2022 07:07     Imaging independently reviewed in Epic.    [facility] [phone] pager 04/09/2022, 10:43 AM

## 2022-04-09 NOTE — Progress Notes (Signed)
   04/09/22 1804  Assess: MEWS Score  Temp 98.3 F (36.8 C)  BP 113/66  MAP (mmHg) 80  Pulse Rate (!) 114  Resp 18  Level of Consciousness Alert  SpO2 98 %  O2 Device Room Air  Assess: MEWS Score  MEWS Temp 0  MEWS Systolic 0  MEWS Pulse 2  MEWS RR 0  MEWS LOC 0  MEWS Score 2  MEWS Score Color Yellow  Assess: if the MEWS score is Yellow or Red  Were vital signs taken at a resting state? Yes  Focused Assessment No change from prior assessment  Does the patient meet 2 or more of the SIRS criteria? Yes  Does the patient have a confirmed or suspected source of infection? Yes  Provider and Rapid Response Notified? No  MEWS guidelines implemented *See Row Information* Yes  Treat  MEWS Interventions Escalated (See documentation below)  Pain Scale 0-10  Pain Score 0  Escalate  MEWS: Escalate Yellow: discuss with charge nurse/RN and consider discussing with provider and RRT  Notify: Provider  Provider Name/Title Dr. Loleta Books  Date Provider Notified 04/09/22  Time Provider Notified 5072  Method of Notification Page  Notification Reason Other (Comment) (Elevated HR)  Provider response See new orders  Date of Provider Response 04/09/22  Time of Provider Response 1807  Document  Patient Outcome Stabilized after interventions  Progress note created (see row info) Yes  Assess: SIRS CRITERIA  SIRS Temperature  0  SIRS Pulse 1  SIRS Respirations  0  SIRS WBC 1  SIRS Score Sum  2

## 2022-04-09 NOTE — Progress Notes (Signed)
  Progress Note   Patient: Angela Mcpherson SWN:[id number] DOB: 11/14/1960 DOA: 04/03/2022     6 DOS: the patient was seen and examined on 04/09/2022 at 12:15PM      Brief hospital course: 61 y.o. female with medical history significant for aortic mural thrombus, rectal cancer.  Presented with abdominal pain and confusion.  She had chemotherapy 2 days prior to admission.  Was noted to be lethargic and has been having loose stools at home.  Evaluation in the ED raised concern for colitis.  Patient was hospitalized for further management.       Assessment and Plan: * Severe sepsis ([place])    Pelvic abscess in female Blood cultures with E coli and Citrobacter brakii, urine culture with Klebsiella, all CTX susceptible - Continue cefepime, flagyl - Consult ID, appreciate cares - Place drain today - Follow culture data  - Continue foley for urinary retention  E coli and citrobacter bacteremia    Coagulopathy (HCC) Due to liver disease - Hold apixaban - Follow INR  Hypomagnesemia Resolved  Hypokalemia - Supplement K  Hepatic cirrhosis (HCC) Due to hep C.  Previously compensated.  No ascites now, just elevated INR, low albumin.  MELD actually 26.  Malnutrition of moderate degree Severe malnutrition ruled out.   Acute urinary retention Left hydronephrosis due to mass Cr normal - Continue foley  Myocardial injury No angina, no ECG changes, demand ischemia, myocardial infarction ruled out.  This is mild myocardial injuyr from sepsis, no further work up needed.  Normocytic anemia Hgb stable, one small bloody BM today.  Hepatitis C - Consult ID   Thrombocytopenia (HCC) Stable, brief bleeding as above.  Aortic mural thrombus (HCC) - Hold ELiquis given coagulapathy  Acute metabolic encephalopathy Resolved  Malignant neoplasm of rectosigmoid junction ([place]) - COnsult Oncology          Subjective: One small bloody BM today, none since.  No fever, no confusion,  no chest apin, no dyspnea.     Physical Exam: Vitals:   04/09/22 1644 04/09/22 1804 04/09/22 2000 04/09/22 2129  BP: 105/65 113/66 123/70 125/77  Pulse: (!) 116 (!) 114 (!) 111 (!) 119  Resp: 18 18  (!) 22  Temp: 97.8 F (36.6 C) 98.3 F (36.8 C) 98.3 F (36.8 C) 98.5 F (36.9 C)  TempSrc: Oral Oral Oral Oral  SpO2: 98% 98% 98% 98%  Weight:      Height:       Thin adult female, ambulating in the room, appears weak and tired, mildly jaundiced Tachycardic, regular, soft systolic murmur, no peripheral edema Respiratory rate normal, lungs clear without rales or wheezes, overall diminished Abdomen tender diffusely, somewhat distended with ascites, voluntary guarding noted Attention normal, affect appropriate, judgment and insight appear normal    Data Reviewed: Discussed with infectious disease and general surgery Patient metabolic panel notable for potassium 2.6, albumin 1.9 B12 replete Hemogram normal for leukocytosis, hemoglobin 9.4, slightly down, platelets 84 Iron studies normal INR 2.2 Lactic acid normal Calcitonin elevated  Family Communication: Husband at the bedside    Disposition: Status is: Inpatient The patient was admitted with sepsis  She has been found to have gram-negative rod bacteremia from a pelvic abscess  She will need a drain placed today, then monitoring of her clinical status, hopefully improvement with antibiotics and discharge within the next 5 to 7 days        Author: Edwin Dada, MD 04/09/2022 9:39 PM  For on call review [email or web address].

## 2022-04-09 NOTE — Assessment & Plan Note (Addendum)
Left hydronephrosis due to mass Cr normal.  Able to remove foley after perc drain placed and has been voiding without difficulty.

## 2022-04-09 NOTE — Assessment & Plan Note (Addendum)
Hep C quant RNA this admission elevated.  No prior treatment.

## 2022-04-09 NOTE — Assessment & Plan Note (Signed)
No angina, no ECG changes, demand ischemia, myocardial infarction ruled out.  This is mild myocardial injuyr from sepsis, no further work up needed.

## 2022-04-09 NOTE — Assessment & Plan Note (Addendum)
Due to hep C.  Previously compensated.  Now with mild ascites, elevated INR, low albumin.  MELD >20. - Check ammonia

## 2022-04-09 NOTE — Assessment & Plan Note (Addendum)
Resolved

## 2022-04-09 NOTE — Assessment & Plan Note (Addendum)
P/w HR 160, BP 113/78, RR 30, lactate 5.6, coagulopathy and encephalopathy.  Blood cultures with E coli and Citrobacter brakii, urine culture with Klebsiella, all CTX susceptible  Subsequently found to have pelvic abscess, drain placed.  Culture from drain growing Ecoli, Prevotella, Pseudomonas and E faecalis.  Cefepime/Flagyl changed to Zosyn.  Post-drain, patient developed new abdominal distension and obstipation, ?ileus - Continue Zosyn - Consult ID and Gen Surg, appreciate cares

## 2022-04-09 NOTE — Progress Notes (Signed)
Pt had bright red blood per rectum - approximately 5-9m.  No additional bleeding noted.  MD notified.  Will continue to monitor.

## 2022-04-09 NOTE — Assessment & Plan Note (Addendum)
Persistent - Standard delirium precautions: blinds open and lights on during day, TV off, minimize interruptions at night, PT/OT, avoiding [place] list medications   - Check ammonia

## 2022-04-09 NOTE — Procedures (Signed)
Interventional Radiology Procedure Note  Procedure: Image guided drain placement, left transgluteal.  31F pigtail drain.  Complications: None  EBL: None Sample: Culture sent, ~60cc frankly purulent/feculent  Recommendations: - Routine drain care, with sterile flushes, record output - follow up Cx - routine wound care - OK to advance diet, per primary order  Signed,  Dulcy Fanny. Earleen Newport, DO

## 2022-04-09 NOTE — Progress Notes (Signed)
Occupational Therapy Treatment Patient Details Name: Angela Mcpherson MRN: [id number] DOB: 1961/04/28 Today's Date: 04/09/2022   History of present illness Patient 61 y.o. female presented 04/03/2022 with abdominal pain, confusion, lethargy, and loose stools.  She underwent CT imaging in the emergency department which showed wall thickening of the colon consistent with colitis.  She also had a distended urinary bladder consistent with urinary retention.  C. difficile and GI pathogen panel testing were both negative.  Her blood cultures have grown E. coli and she is currently on ceftriaxone.  She has been afebrile since 8/19 at 8 PM on current antibiotics. 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   OT comments  Patient not requiring physical assistance but needing assistance for lines/leads. Limited to just BSC and standing at sink. Had two Bms while therapist in room. Progressing well. Do not expect any home OT needs at discharge.   Recommendations for follow up therapy are one component of a multi-disciplinary discharge planning process, led by the attending physician.  Recommendations may be updated based on patient status, additional functional criteria and insurance authorization.    Follow Up Recommendations  No OT follow up    Assistance Recommended at Discharge Intermittent Supervision/Assistance  Patient can return home with the following  A little help with walking and/or transfers;A little help with bathing/dressing/bathroom;Assistance with cooking/housework;Help with stairs or ramp for entrance   Equipment Recommendations  None recommended by OT    Recommendations for Other Services      Precautions / Restrictions Precautions Precautions: Fall Precaution Comments: monitor HR Restrictions Weight Bearing Restrictions: No       Mobility Bed Mobility Overal bed mobility: Modified Independent Bed Mobility: Sit to  Supine, Supine to Sit           General bed mobility comments: pt taking extra time and bed rail    Transfers   Equipment used: None Transfers: Sit to/from Stand Sit to Stand: Supervision           General transfer comment: To transfer to [facility] and to sink. Assistance for lines/leads     Balance Overall balance assessment: Mild deficits observed, not formally tested                                         ADL either performed or assessed with clinical judgement   ADL Overall ADL's : Needs assistance/impaired     Grooming: Supervision/safety Grooming Details (indicate cue type and reason): to stand at sink for oral care                 Toilet Transfer: Min guard;BSC/3in1   Toileting- Clothing Manipulation and Hygiene: Supervision/safety;Sit to/from stand       Functional mobility during ADLs: Supervision/safety General ADL Comments: Patient overall needed supervision  due IV, catheter and telemetry box. no physical assistance needed for ADLs and mobility. Continues to have frequent BMs    Extremity/Trunk Assessment Upper Extremity Assessment Upper Extremity Assessment: Overall WFL for tasks assessed   Lower Extremity Assessment Lower Extremity Assessment: Defer to PT evaluation   Cervical / Trunk Assessment Cervical / Trunk Assessment: Normal    Vision   Vision Assessment?: No apparent visual deficits   Perception     Praxis      Cognition Arousal/Alertness: Awake/alert Behavior During Therapy: WFL for tasks assessed/performed Overall Cognitive Status: Within Functional Limits for  tasks assessed                                          Exercises      Shoulder Instructions       General Comments      Pertinent Vitals/ Pain       Pain Assessment Pain Assessment: No/denies pain  Home Living                                          Prior Functioning/Environment               Frequency  Min 2X/week        Progress Toward Goals  OT Goals(current goals can now be found in the care plan section)  Progress towards OT goals: Progressing toward goals  Acute Rehab OT Goals Patient Stated Goal: to eat OT Goal Formulation: With patient/family Time For Goal Achievement: 04/20/22 Potential to Achieve Goals: Good  Plan Discharge plan remains appropriate    Co-evaluation                 AM-PAC OT "6 Clicks" Daily Activity     Outcome Measure   Help from another person eating meals?: None Help from another person taking care of personal grooming?: None Help from another person toileting, which includes using toliet, bedpan, or urinal?: A Little Help from another person bathing (including washing, rinsing, drying)?: A Little Help from another person to put on and taking off regular upper body clothing?: A Little Help from another person to put on and taking off regular lower body clothing?: A Little 6 Click Score: 20    End of Session Equipment Utilized During Treatment: Gait belt  OT Visit Diagnosis: Unsteadiness on feet (R26.81);Other abnormalities of gait and mobility (R26.89);Muscle weakness (generalized) (M62.81)   Activity Tolerance Patient tolerated treatment well   Patient Left in bed;with call bell/phone within reach;with family/visitor present;with nursing/sitter in room   Nurse Communication Mobility status        Time: 3664-4034 OT Time Calculation (min): 27 min  Charges: OT General Charges $OT Visit: 1 Visit OT Treatments $Self Care/Home Management : 23-37 mins  Angela Mcpherson, OTR/L [place]  Office [phone] Pager: [place] 04/09/2022, 11:32 AM

## 2022-04-09 NOTE — Assessment & Plan Note (Addendum)
Pre-existing aortic mural thrombus. - Hold Eliquis on hold given coagulapathy

## 2022-04-09 NOTE — Progress Notes (Addendum)
Progress Note     Subjective: Having intermittent periumbilical and lower abdominal pain that is exacerbated with movement. No nausea or emesis. She is thirsty. She has had bright red bloody stools in the last 24 hours. She has had bloody with Bms in the past but thinks these looked worse  Husband is bedside   Objective: Vital signs in last 24 hours: Temp:  [97.3 F (36.3 C)-99.4 F (37.4 C)] 97.7 F (36.5 C) (08/23 0411) Pulse Rate:  [99-116] 105 (08/23 0411) Resp:  [16-22] 19 (08/23 0411) BP: (94-110)/(64-74) 110/68 (08/23 0411) SpO2:  [96 %-99 %] 99 % (08/23 0411) Last BM Date : 04/07/22  Intake/Output from previous day: 08/22 0701 - 08/23 0700 In: 2996.2 [P.O.:237; I.V.:2090.2; IV Piggyback:244] Out: 750 [Urine:750] Intake/Output this shift: No intake/output data recorded.  PE: General: pleasant, WD, female who is laying in bed in NAD Heart: Palpable radial and pedal pulses bilaterally Lungs: CTAB, no wheezes, rhonchi, or rales noted.  Respiratory effort nonlabored Abd: soft, +BS, no masses, hernias, or organomegaly. Moderate distension. Mild TTP suprapubic region without rebound or guarding MSK: all 4 extremities are symmetrical with no cyanosis, clubbing, or edema. Skin: warm and dry with no masses, lesions, or rashes Psych: A&Ox3 with an appropriate affect.    Lab Results:  Recent Labs    04/08/22 0419 04/09/22 0423  WBC 16.8* 21.3*  HGB 11.7* 9.4*  HCT 34.1* 27.3*  PLT 92* 84*   BMET Recent Labs    04/08/22 0419 04/09/22 0423  NA 133* 135  K 3.6 2.6*  CL 107 107  CO2 20* 20*  GLUCOSE 96 111*  BUN 13 11  CREATININE 0.56 0.54  CALCIUM 7.9* 7.7*   PT/INR Recent Labs    04/08/22 1517 04/09/22 0423  LABPROT 29.1* 24.5*  INR 2.8* 2.2*   CMP     Component Value Date/Time   NA 135 04/09/2022 0423   K 2.6 (LL) 04/09/2022 0423   CL 107 04/09/2022 0423   CO2 20 (L) 04/09/2022 0423   GLUCOSE 111 (H) 04/09/2022 0423   BUN 11 04/09/2022 0423    CREATININE 0.54 04/09/2022 0423   CREATININE 0.55 03/24/2022 1029   CALCIUM 7.7 (L) 04/09/2022 0423   PROT 4.7 (L) 04/09/2022 0423   ALBUMIN 1.9 (L) 04/09/2022 0423   AST 32 04/09/2022 0423   AST 28 03/24/2022 1029   ALT 25 04/09/2022 0423   ALT 19 03/24/2022 1029   ALKPHOS 82 04/09/2022 0423   BILITOT 2.1 (H) 04/09/2022 0423   BILITOT 1.8 (H) 03/24/2022 1029   GFRNONAA >60 04/09/2022 0423   GFRNONAA >60 03/24/2022 1029   GFRAA  06/26/2010 0755    >60        The eGFR has been calculated using the MDRD equation. This calculation has not been validated in all clinical situations. eGFR's persistently <60 mL/min signify possible Chronic Kidney Disease.   Lipase     Component Value Date/Time   LIPASE 44 04/03/2022 1126       Studies/Results: CT ABDOMEN PELVIS W CONTRAST  Result Date: 04/08/2022 CLINICAL DATA:  Sepsis, persistent fever, abdominal pain. * Tracking Code: BO * EXAM: CT ABDOMEN AND PELVIS WITH CONTRAST TECHNIQUE: Multidetector CT imaging of the abdomen and pelvis was performed using the standard protocol following bolus administration of intravenous contrast. RADIATION DOSE REDUCTION: This exam was performed according to the departmental dose-optimization program which includes automated exposure control, adjustment of the mA and/or kV according to patient size and/or use of  iterative reconstruction technique. CONTRAST:  152m OMNIPAQUE IOHEXOL 300 MG/ML  SOLN COMPARISON:  None Available. FINDINGS: Lower chest: Bibasilar atelectasis small effusions. Hepatobiliary: No focal hepatic lesion. New large volume of fluid surrounds the margin of the liver extends along the RIGHT pericolic gutter. Gallbladder distended without evidence inflammation. No biliary duct dilatation. Portal veins are patent. Pancreas: Pancreas is normal. No ductal dilatation. No pancreatic inflammation. Spleen: Normal spleen Adrenals/urinary tract: Adrenal glands normal. There is mild hydronephrosis  hydroureter on the LEFT. Obstructive LEFT ureter likely related to abscess formation in the LEFT pelvis. Stomach/Bowel: Stomach proximal small bowel normal. Contrast flows proximal half way through the small bowel. There is multiple air-fluid levels within the small bowel without significant dilatation. The ascending and transverse colon gas-filled relatively normal. No evidence of colitis seen on comparison exam. Within the LEFT lower quadrant just superior to the operator space is a contained fluid collection which has fluid and gas measuring 4.5 x 6.7 cm (image 82/2). Favor contained perforation of the bowel at rectosigmoid level small wiht extraluminal gas noted on image 79/2. No significant intraperitoneal free air. There is a large volume intraperitoneal free fluid. Margin of the free fluid is enhancing in the LEFT pericolic gutter (image 541/6 consistent peritonitis. Again demonstrated a mass at the rectosigmoid junction. Vascular/Lymphatic: Abdominal aorta is normal caliber with intimal calcification. Lymph node deep to the operator space again noted on the LEFT measuring 3.1 x 2.6 cm Reproductive: Grossly unremarkable Other: None Musculoskeletal: No aggressive osseous lesion. IMPRESSION: 1. Gas and fluid collection in the deep LEFT pelvis most consistent with perforation of the rectosigmoid colon with contained abscess formation. 2. New large volume intraperitoneal free fluid. Evidence of peritonitis associated the free fluid. 3. Small bowel ileus. 4. No large volume intraperitoneal free air. 5. New mild hydro nephrosis and hydroureter on the LEFT presumed related to obstruction of LEFT ureter by the LEFT pelvic abscess. [facility] 04/08/2022  at13:59. Electronically Signed   By: SSuzy BouchardM.D.   On: 04/08/2022 14:01   DG Abd 1 View  Result Date: 04/08/2022 CLINICAL DATA:  61year old female history of abdominal distension. EXAM: ABDOMEN - 1 VIEW COMPARISON:  No prior  abdominal radiograph. CT of the abdomen and pelvis 04/03/2022. FINDINGS: Diffuse gaseous distension is noted throughout the small bowel and colon. Small bowel loops measure up to 4.1 cm in the central abdomen. Rectal gas is noted. No definite pneumoperitoneum confidently identified. IMPRESSION: Abnormal bowel-gas pattern, most suggestive of an ileus, as above. Electronically Signed   By: DVinnie LangtonM.D.   On: 04/08/2022 07:07    Anti-infectives: Anti-infectives (From admission, onward)    Start     Dose/Rate Route Frequency Ordered Stop   04/08/22 2100  ceFEPIme (MAXIPIME) 2 g in sodium chloride 0.9 % 100 mL IVPB        2 g 200 mL/hr over 30 Minutes Intravenous Every 12 hours 04/08/22 1453     04/08/22 1600  metroNIDAZOLE (FLAGYL) IVPB 500 mg        500 mg 100 mL/hr over 60 Minutes Intravenous Every 12 hours 04/08/22 1453     04/08/22 1515  piperacillin-tazobactam (ZOSYN) IVPB 3.375 g  Status:  Discontinued        3.375 g 12.5 mL/hr over 240 Minutes Intravenous Every 8 hours 04/08/22 1428 04/08/22 1447   04/07/22 2000  ceFEPIme (MAXIPIME) 2 g in sodium chloride 0.9 % 100 mL IVPB  Status:  Discontinued  2 g 200 mL/hr over 30 Minutes Intravenous Every 12 hours 04/07/22 1600 04/08/22 1428   04/04/22 0900  cefTRIAXone (ROCEPHIN) 2 g in sodium chloride 0.9 % 100 mL IVPB  Status:  Discontinued        2 g 200 mL/hr over 30 Minutes Intravenous Every 24 hours 04/04/22 0257 04/07/22 1600   04/03/22 1700  ceFEPIme (MAXIPIME) 2 g in sodium chloride 0.9 % 100 mL IVPB  Status:  Discontinued        2 g 200 mL/hr over 30 Minutes Intravenous Every 8 hours 04/03/22 1634 04/04/22 0257   04/03/22 1500  metroNIDAZOLE (FLAGYL) IVPB 500 mg  Status:  Discontinued        500 mg 100 mL/hr over 60 Minutes Intravenous Every 12 hours 04/03/22 1446 04/04/22 0257   04/03/22 1045  ceFEPIme (MAXIPIME) 2 g in sodium chloride 0.9 % 100 mL IVPB        2 g 200 mL/hr over 30 Minutes Intravenous  Once 04/03/22  1031 04/03/22 1429   04/03/22 1045  vancomycin (VANCOCIN) IVPB 1000 mg/200 mL premix        1,000 mg 200 mL/hr over 60 Minutes Intravenous  Once 04/03/22 1031 04/03/22 1241        Assessment/Plan Metastatic colorectal cancer and chemotherapy, last dose 1 week ago.  Admission with diarrhea and colitis on antibiotics. New pelvic abscess concern for contained perforation at cancer.  - no indication for emergent surgical intervention - no peritonitis and no a good surgical candidate given malnourishment and recent chemotherapy. Plan for IR drain today hopefully - INR is 2.2 this am - WBC 21.3 (16.8), febrile to 101.5F yesterday am. Continue abx - ID following - can resume a clear liquid diet after procedure from our standpoint  FEN: NPO for IR procedure ID: [email or web address] VTE: eliquis held (last dose yesterday am)  I reviewed Consultant ID notes, hospitalist notes, last 24 h vitals and pain scores, last 48 h intake and output, last 24 h labs and trends, and last 24 h imaging results.    LOS: 6 days   [facility] 04/09/2022, 7:59 AM Please see Amion for pager number during day hours 7:00am-4:30pm

## 2022-04-09 NOTE — Assessment & Plan Note (Addendum)
Due to liver disease.  INR >4 on admission, has trended down, today 2 - Repeat vitamin K - Would need vitamin K and FFP prior to surgery   - Hold apixaban - Follow INR

## 2022-04-09 NOTE — Progress Notes (Signed)
       CROSS COVER NOTE  NAME: Angela Mcpherson MRN: [id number] DOB : [birth year]    Date of Service   04/09/2022   HPI/Events of Note   Notified of Critical K-->2.6  Interventions   Plan:  Hypokalemia 40 mEq IV K  Recheck K at 11AM Consider PO K once diet resumed after procedure      This document was prepared using Dragon voice recognition software and may include unintentional dictation errors.  Neomia Glass DNP, MHA, FNP-BC Nurse Practitioner [facility] Pager [phone]

## 2022-04-09 NOTE — Assessment & Plan Note (Addendum)
-   Supplement in TPN

## 2022-04-09 NOTE — Assessment & Plan Note (Addendum)
Widely metastatic to liver and lungs.    Given her perforation, deteriorating liver function and worsening functional status, the patient's prognosis is darkening, becoming less likely she will be able to tolerate chemo again in the future. - Consult Palliative Care

## 2022-04-09 NOTE — Hospital Course (Addendum)
Mrs. Bark is a 61 y.o. 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     8/17: Admitted on antibiotics for septic shock from colitis 8/18: Blood cultures growing E coli 8/21: WBC rising, ID consulted 8/22: Still worsening, CT showed new left sided abscess, not present on admission; Gen Surg and IR consulted 8/23: Underwent perc drain placement 8/24: Increasing abdominal pain --> x-ray shows ileus vs obstruction 8/26: NG placed 8/27: Patient pulled NG out overnight, Palliative consulted, encephalopathy worse 8/28: Remove NG tube twice more overnight, fourth NG tube placed this morning 8/29: Remains delirious, poorly responsive

## 2022-04-09 NOTE — Assessment & Plan Note (Addendum)
Stable

## 2022-04-10 ENCOUNTER — Inpatient Hospital Stay (HOSPITAL_COMMUNITY): Payer: 59

## 2022-04-10 DIAGNOSIS — I7410 Embolism and thrombosis of unspecified parts of aorta: Secondary | ICD-10-CM | POA: Diagnosis not present

## 2022-04-10 DIAGNOSIS — R338 Other retention of urine: Secondary | ICD-10-CM | POA: Diagnosis not present

## 2022-04-10 DIAGNOSIS — D689 Coagulation defect, unspecified: Secondary | ICD-10-CM

## 2022-04-10 DIAGNOSIS — A419 Sepsis, unspecified organism: Secondary | ICD-10-CM | POA: Diagnosis not present

## 2022-04-10 DIAGNOSIS — R6520 Severe sepsis without septic shock: Secondary | ICD-10-CM | POA: Diagnosis not present

## 2022-04-10 DIAGNOSIS — G9341 Metabolic encephalopathy: Secondary | ICD-10-CM | POA: Diagnosis not present

## 2022-04-10 LAB — CBC
HCT: 25.4 % — ABNORMAL LOW (ref 36.0–46.0)
Hemoglobin: 8.9 g/dL — ABNORMAL LOW (ref 12.0–15.0)
MCH: 34.5 pg — ABNORMAL HIGH (ref 26.0–34.0)
MCHC: 35 g/dL (ref 30.0–36.0)
MCV: 98.4 fL (ref 80.0–100.0)
Platelets: 85 10*3/uL — ABNORMAL LOW (ref 150–400)
RBC: 2.58 MIL/uL — ABNORMAL LOW (ref 3.87–5.11)
RDW: 24.5 % — ABNORMAL HIGH (ref 11.5–15.5)
WBC: 16.4 10*3/uL — ABNORMAL HIGH (ref 4.0–10.5)
nRBC: 0 % (ref 0.0–0.2)

## 2022-04-10 LAB — COMPREHENSIVE METABOLIC PANEL
ALT: 20 U/L (ref 0–44)
AST: 23 U/L (ref 15–41)
Albumin: 1.7 g/dL — ABNORMAL LOW (ref 3.5–5.0)
Alkaline Phosphatase: 73 U/L (ref 38–126)
Anion gap: 3 — ABNORMAL LOW (ref 5–15)
BUN: 10 mg/dL (ref 6–20)
CO2: 18 mmol/L — ABNORMAL LOW (ref 22–32)
Calcium: 7.7 mg/dL — ABNORMAL LOW (ref 8.9–10.3)
Chloride: 115 mmol/L — ABNORMAL HIGH (ref 98–111)
Creatinine, Ser: 0.49 mg/dL (ref 0.44–1.00)
GFR, Estimated: >60 mL/min (ref >60)
Glucose, Bld: 120 mg/dL — ABNORMAL HIGH (ref 70–99)
Potassium: 2.8 mmol/L — ABNORMAL LOW (ref 3.5–5.1)
Sodium: 136 mmol/L (ref 135–145)
Total Bilirubin: 1.7 mg/dL — ABNORMAL HIGH (ref 0.3–1.2)
Total Protein: 4.4 g/dL — ABNORMAL LOW (ref 6.5–8.1)

## 2022-04-10 LAB — PROTIME-INR
INR: 2.2 — ABNORMAL HIGH (ref 0.8–1.2)
Prothrombin Time: 24.1 seconds — ABNORMAL HIGH (ref 11.4–15.2)

## 2022-04-10 LAB — HCV RNA QUANT
HCV Quantitative Log: 5.167 log10 IU/mL (ref >1.70)
HCV Quantitative: 147000 IU/mL (ref >50)

## 2022-04-10 LAB — MAGNESIUM: Magnesium: 1.5 mg/dL — ABNORMAL LOW (ref 1.7–2.4)

## 2022-04-10 MED ORDER — HYDROMORPHONE HCL 1 MG/ML IJ SOLN
1.0000 mg | INTRAMUSCULAR | Status: AC | PRN
  Administered 2022-04-10 – 2022-04-11 (×2): 1 mg via INTRAVENOUS
  Filled 2022-04-10 (×2): qty 1

## 2022-04-10 MED ORDER — POTASSIUM CHLORIDE 20 MEQ PO PACK
60.0000 meq | PACK | Freq: Two times a day (BID) | ORAL | Status: AC
Start: 2022-04-10 — End: 2022-04-10
  Administered 2022-04-10 (×2): 60 meq via ORAL
  Filled 2022-04-10 (×2): qty 3

## 2022-04-10 MED ORDER — ORAL CARE MOUTH RINSE
15.0000 mL | OROMUCOSAL | Status: DC | PRN
Start: 2022-04-10 — End: 2022-04-25

## 2022-04-10 MED ORDER — MAGNESIUM SULFATE 2 GM/50ML IV SOLN
2.0000 g | Freq: Once | INTRAVENOUS | Status: AC
  Administered 2022-04-10: 2 g via INTRAVENOUS
  Filled 2022-04-10: qty 50

## 2022-04-10 MED ORDER — OXYCODONE HCL 5 MG PO TABS
7.5000 mg | ORAL_TABLET | ORAL | Status: DC | PRN
  Administered 2022-04-10 – 2022-04-11 (×2): 7.5 mg via ORAL
  Filled 2022-04-10 (×2): qty 2

## 2022-04-10 MED ORDER — BOOST / RESOURCE BREEZE PO LIQD CUSTOM
1.0000 | Freq: Two times a day (BID) | ORAL | Status: DC
  Administered 2022-04-10 – 2022-04-11 (×4): 1 via ORAL

## 2022-04-10 MED ORDER — OXYCODONE HCL 5 MG PO TABS
2.5000 mg | ORAL_TABLET | Freq: Once | ORAL | Status: AC
  Administered 2022-04-10: 2.5 mg via ORAL
  Filled 2022-04-10: qty 1

## 2022-04-10 MED ORDER — FENTANYL CITRATE PF 50 MCG/ML IJ SOSY
25.0000 ug | PREFILLED_SYRINGE | INTRAMUSCULAR | Status: DC | PRN
  Administered 2022-04-10: 25 ug via INTRAVENOUS

## 2022-04-10 MED ORDER — FENTANYL CITRATE PF 50 MCG/ML IJ SOSY
PREFILLED_SYRINGE | INTRAMUSCULAR | Status: AC
  Filled 2022-04-10: qty 1

## 2022-04-10 NOTE — Progress Notes (Signed)
   04/09/22 2230  Provider Notification  Provider Name/Title Hollace Hayward, NP  Date Provider Notified 04/09/22  Time Provider Notified 2227  Method of Notification Page  Notification Reason Other (Comment) (elevated HR (noticed heart monitor was hanging around the 130's))  Provider response See new orders  Date of Provider Response 04/09/22  Time of Provider Response 2248  Rapid Response Notification  Name of Rapid Response RN Notified Leafy Ro, RN (Rapid Response RN)  Date Rapid Response Notified 04/09/22  Time Rapid Response Notified 2218   Notified Charge Nurse at 2231 about patient's current status. Notified on call provider about calling Rapid Response Nurse due to patient's heart rate in the 130's on heart monitor. STAT EKG was obtained and patient was already halfway through IV NaCl 0.9% 500 mL bolus. Patient was also in a lot of pain, which could have been contributing to the elevated HR. Nurse gave PRN Tylenol, since patient was not due for the PRN oxyCODONE until 2331. On call provider put in a one time order for IV 25 mcg fentaNYL. Will continue to monitor patient and give the next PRN pain medication when due.

## 2022-04-10 NOTE — Progress Notes (Signed)
Angela Mcpherson   DOB:[birth year]   JO#:[id number]   ZSW#:[id number]  Oncology follow up   Subjective: Pt underwent CT-guided left pelvic abscess drainage by IR yesterday.  Cultures are pending.  She appears to be slightly confused, cannot tell me the name of the hospital she is, but does recognize me and answer most questions.  She has been doing well with stable vital signs in the past few days.  Objective:  Vitals:   04/10/22 0933 04/10/22 1303  BP: 101/66 114/75  Pulse: 100 (!) 110  Resp: 18 17  Temp: 97.7 F (36.5 C) 98.1 F (36.7 C)  SpO2: 97% 98%    Body mass index is 23.73 kg/m.  Intake/Output Summary (Last 24 hours) at 04/10/2022 1635 Last data filed at 04/10/2022 1014 Gross per 24 hour  Intake 2460 ml  Output 725 ml  Net 1735 ml     Sclerae unicteric  Oropharynx clear  No peripheral adenopathy  Lungs clear -- no rales or rhonchi  Heart regular rate and rhythm  Abdomen soft, mildly distended, diffuse tenderness in the left side, (+) draining tube in left buttock area.  MSK no focal spinal tenderness, no peripheral edema  Neuro nonfocal    CBG (last 3)  Recent Labs    04/08/22 0609  GLUCAP 80     Labs:   Urine Studies No results for input(s): "UHGB", "CRYS" in the last 72 hours.  Invalid input(s): "UACOL", "UAPR", "USPG", "UPH", "UTP", "UGL", "UKET", "UBIL", "UNIT", "UROB", "ULEU", "UEPI", "UWBC", "URBC", "UBAC", "CAST", "UCOM", "BILUA"  Basic Metabolic Panel: Recent Labs  Lab 04/04/22 0744 04/05/22 0502 04/06/22 0352 04/07/22 0332 04/08/22 0419 04/09/22 0423 04/09/22 1252 04/10/22 0410  NA  --    < > 136 136 133* 135  --  136  K  --    < > 3.8 3.8 3.6 2.6* 3.2* 2.8*  CL  --    < > 111 112* 107 107  --  115*  CO2  --    < > 22 20* 20* 20*  --  18*  GLUCOSE  --    < > 116* 120* 96 111*  --  120*  BUN  --    < > '17 15 13 11  '$ --  10  CREATININE  --    < > 0.70 0.63 0.56 0.54  --  0.49  CALCIUM  --    < > 7.9* 7.7* 7.9* 7.7*  --  7.7*  MG 2.1  --   1.6* 2.0  --   --   --  1.5*   < > = values in this interval not displayed.   GFR Estimated Creatinine Clearance: 64.6 mL/min (by C-G formula based on SCr of 0.49 mg/dL). Liver Function Tests: Recent Labs  Lab 04/06/22 0352 04/07/22 0332 04/08/22 0419 04/09/22 0423 04/10/22 0410  AST 32 42* 54* 32 23  ALT 22 28 33 25 20  ALKPHOS 72 81 95 82 73  BILITOT 1.8* 1.9* 2.4* 2.1* 1.7*  PROT 4.5* 4.6* 4.7* 4.7* 4.4*  ALBUMIN 2.1* 2.1* 2.0* 1.9* 1.7*   No results for input(s): "LIPASE", "AMYLASE" in the last 168 hours.  No results for input(s): "AMMONIA" in the last 168 hours.  Coagulation profile Recent Labs  Lab 04/04/22 0336 04/08/22 1517 04/09/22 0423 04/10/22 0410  INR 4.0* 2.8* 2.2* 2.2*    CBC: Recent Labs  Lab 04/05/22 0502 04/06/22 0352 04/07/22 0332 04/08/22 0419 04/09/22 0423 04/10/22 0410  WBC 8.1 12.0* 16.6* 16.8* 21.3*  16.4*  NEUTROABS 6.7 9.3* 13.6* 14.1* 19.2*  --   HGB 11.5* 10.0* 10.5* 11.7* 9.4* 8.9*  HCT 32.7* 28.9* 30.5* 34.1* 27.3* 25.4*  MCV 97.3 98.3 99.0 99.4 99.6 98.4  PLT 56* 46* 60* 92* 84* 85*   Cardiac Enzymes: No results for input(s): "CKTOTAL", "CKMB", "CKMBINDEX", "TROPONINI" in the last 168 hours. BNP: Invalid input(s): "POCBNP" CBG: Recent Labs  Lab 04/08/22 0609  GLUCAP 80   D-Dimer No results for input(s): "DDIMER" in the last 72 hours. Hgb A1c No results for input(s): "HGBA1C" in the last 72 hours. Lipid Profile No results for input(s): "CHOL", "HDL", "LDLCALC", "TRIG", "CHOLHDL", "LDLDIRECT" in the last 72 hours. Thyroid function studies No results for input(s): "TSH", "T4TOTAL", "T3FREE", "THYROIDAB" in the last 72 hours.  Invalid input(s): "FREET3" Anemia work up Recent Labs    04/09/22 0423  VITAMINB12 1,530*  FOLATE >40.0  FERRITIN 220  TIBC 172*  IRON 18*  RETICCTPCT 3.5*   Microbiology Recent Results (from the past 240 hour(s))  Culture, blood (routine x 2)     Status: Abnormal (Preliminary result)    Collection Time: 04/03/22 10:40 AM   Specimen: BLOOD  Result Value Ref Range Status   Specimen Description   Final    BLOOD RIGHT ANTECUBITAL Performed at [facility] [address] [place], [place] [address]    Special Requests   Final    BOTTLES DRAWN AEROBIC AND ANAEROBIC Blood Culture results may not be optimal due to an inadequate volume of blood received in culture bottles Performed at [facility] [address], [place], [place] [address]    Culture  Setup Time   Final    GRAM NEGATIVE RODS IN BOTH AEROBIC AND ANAEROBIC BOTTLES CRITICAL VALUE NOTED.  VALUE IS CONSISTENT WITH PREVIOUSLY REPORTED AND CALLED VALUE.    Culture (A)  Final    CITROBACTER BRAAKII GRAM NEGATIVE RODS IDENTIFICATION TO FOLLOW CRITICAL RESULT CALLED TO, READ BACK BY AND VERIFIED WITH: [place] [id number] AT 72 BY EC Performed at [facility], [place] [address] [place], [place] [address]    Report Status PENDING  Incomplete   Organism ID, Bacteria CITROBACTER BRAAKII  Final      Susceptibility   Citrobacter braakii - MIC*    CEFAZOLIN RESISTANT Resistant     CEFEPIME <=0.12 SENSITIVE Sensitive     CEFTAZIDIME <=1 SENSITIVE Sensitive     CEFTRIAXONE <=0.25 SENSITIVE Sensitive     CIPROFLOXACIN <=0.25 SENSITIVE Sensitive     GENTAMICIN <=1 SENSITIVE Sensitive     IMIPENEM <=0.25 SENSITIVE Sensitive     TRIMETH/SULFA <=20 SENSITIVE Sensitive     PIP/TAZO <=4 SENSITIVE Sensitive     * CITROBACTER BRAAKII  Culture, blood (routine x 2)     Status: Abnormal   Collection Time: 04/03/22 10:40 AM   Specimen: BLOOD  Result Value Ref Range Status   Specimen Description   Final    BLOOD LEFT ANTECUBITAL Performed at [facility] [address] [place], [place] [address]    Special Requests   Final    BOTTLES DRAWN AEROBIC AND ANAEROBIC Blood Culture adequate volume Performed at [facility]  [address], [place], [place] [address]    Culture  Setup Time   Final    GRAM NEGATIVE RODS IN BOTH AEROBIC AND ANAEROBIC BOTTLES CRITICAL RESULT CALLED TO, READ BACK BY AND VERIFIED WITH: M LILLISTON,PHARMD'@0138'$  04/04/22 [place] Performed at [facility],  [address], [place], [place] [address]    Culture ESCHERICHIA COLI (A)  Final   Report Status 04/06/2022 FINAL  Final   Organism ID, Bacteria ESCHERICHIA COLI  Final      Susceptibility   Escherichia coli - MIC*    AMPICILLIN RESISTANT Resistant     CEFAZOLIN 32 INTERMEDIATE Intermediate     CEFEPIME <=0.12 SENSITIVE Sensitive     CEFTAZIDIME <=1 SENSITIVE Sensitive     CEFTRIAXONE <=0.25 SENSITIVE Sensitive     CIPROFLOXACIN <=0.25 SENSITIVE Sensitive     GENTAMICIN <=1 SENSITIVE Sensitive     IMIPENEM <=0.25 SENSITIVE Sensitive     TRIMETH/SULFA <=20 SENSITIVE Sensitive     AMPICILLIN/SULBACTAM <=2 SENSITIVE Sensitive     PIP/TAZO <=4 SENSITIVE Sensitive     * ESCHERICHIA COLI  Blood Culture ID Panel (Reflexed)     Status: Abnormal   Collection Time: 04/03/22 10:40 AM  Result Value Ref Range Status   Enterococcus faecalis NOT DETECTED NOT DETECTED Final   Enterococcus Faecium NOT DETECTED NOT DETECTED Final   Listeria monocytogenes NOT DETECTED NOT DETECTED Final   Staphylococcus species NOT DETECTED NOT DETECTED Final   Staphylococcus aureus (BCID) NOT DETECTED NOT DETECTED Final   Staphylococcus epidermidis NOT DETECTED NOT DETECTED Final   Staphylococcus lugdunensis NOT DETECTED NOT DETECTED Final   Streptococcus species NOT DETECTED NOT DETECTED Final   Streptococcus agalactiae NOT DETECTED NOT DETECTED Final   Streptococcus pneumoniae NOT DETECTED NOT DETECTED Final   Streptococcus pyogenes NOT DETECTED NOT DETECTED Final   A.calcoaceticus-baumannii NOT DETECTED NOT DETECTED Final   Bacteroides fragilis NOT DETECTED NOT DETECTED Final   Enterobacterales DETECTED (A) NOT DETECTED Final    Comment: Enterobacterales  represent a large order of gram negative bacteria, not a single organism. CRITICAL RESULT CALLED TO, READ BACK BY AND VERIFIED WITH: M LILLISTON,PHARMD'@0138'$  04/04/22 [place]    Enterobacter cloacae complex NOT DETECTED NOT DETECTED Final   Escherichia coli DETECTED (A) NOT DETECTED Final    Comment: CRITICAL RESULT CALLED TO, READ BACK BY AND VERIFIED WITH: M LILLISTON,PHARMD'@0138'$  04/04/22 [facility]    Klebsiella aerogenes NOT DETECTED NOT DETECTED Final   Klebsiella oxytoca NOT DETECTED NOT DETECTED Final   Klebsiella pneumoniae NOT DETECTED NOT DETECTED Final   Proteus species NOT DETECTED NOT DETECTED Final   Salmonella species NOT DETECTED NOT DETECTED Final   Serratia marcescens NOT DETECTED NOT DETECTED Final   Haemophilus influenzae NOT DETECTED NOT DETECTED Final   Neisseria meningitidis NOT DETECTED NOT DETECTED Final   Pseudomonas aeruginosa NOT DETECTED NOT DETECTED Final   Stenotrophomonas maltophilia NOT DETECTED NOT DETECTED Final   Candida albicans NOT DETECTED NOT DETECTED Final   Candida auris NOT DETECTED NOT DETECTED Final   Candida glabrata NOT DETECTED NOT DETECTED Final   Candida krusei NOT DETECTED NOT DETECTED Final   Candida parapsilosis NOT DETECTED NOT DETECTED Final   Candida tropicalis NOT DETECTED NOT DETECTED Final   Cryptococcus neoformans/gattii NOT DETECTED NOT DETECTED Final   CTX-M ESBL NOT DETECTED NOT DETECTED Final   Carbapenem resistance IMP NOT DETECTED NOT DETECTED Final   Carbapenem resistance KPC NOT DETECTED NOT DETECTED Final   Carbapenem resistance NDM NOT DETECTED NOT DETECTED Final   Carbapenem resist OXA 48 LIKE NOT DETECTED NOT DETECTED Final   Carbapenem resistance VIM NOT DETECTED NOT DETECTED Final    Comment: Performed at [facility], [address] [place], [place] [address]  Resp Panel by RT-PCR (Flu A&B, Covid) Anterior Nasal  Swab     Status: None   Collection Time: 04/03/22 10:47 AM   Specimen: Anterior Nasal Swab  Result  Value Ref Range Status   SARS Coronavirus 2 by RT PCR NEGATIVE NEGATIVE Final    Comment: (NOTE) SARS-CoV-2 target nucleic acids are NOT DETECTED.  The SARS-CoV-2 RNA is generally detectable in upper respiratory specimens during the acute phase of infection. The lowest concentration of SARS-CoV-2 viral copies this assay can detect is 138 copies/mL. A negative result does not preclude SARS-Cov-2 infection and should not be used as the sole basis for treatment or other patient management decisions. A negative result may occur with  improper specimen collection/handling, submission of specimen other than nasopharyngeal swab, presence of viral mutation(s) within the areas targeted by this assay, and inadequate number of viral copies(<138 copies/mL). A negative result must be combined with clinical observations, patient history, and epidemiological information. The expected result is Negative.  Fact Sheet for Patients:  EntrepreneurPulse.com.au  Fact Sheet for Healthcare Providers:  IncredibleEmployment.be  This test is no t yet approved or cleared by the [place] FDA and  has been authorized for detection and/or diagnosis of SARS-CoV-2 by FDA under an Emergency Use Authorization (EUA). This EUA will remain  in effect (meaning this test can be used) for the duration of the COVID-19 declaration under Section 564(b)(1) of the Act, 21 U.S.C.section 360bbb-3(b)(1), unless the authorization is terminated  or revoked sooner.       Influenza A by PCR NEGATIVE NEGATIVE Final   Influenza B by PCR NEGATIVE NEGATIVE Final    Comment: (NOTE) The Xpert Xpress SARS-CoV-2/FLU/RSV plus assay is intended as an aid in the diagnosis of influenza from Nasopharyngeal swab specimens and should not be used as a sole basis for treatment. Nasal washings and aspirates are unacceptable for Xpert Xpress SARS-CoV-2/FLU/RSV testing.  Fact Sheet for  Patients: EntrepreneurPulse.com.au  Fact Sheet for Healthcare Providers: IncredibleEmployment.be  This test is not yet approved or cleared by the [place] FDA and has been authorized for detection and/or diagnosis of SARS-CoV-2 by FDA under an Emergency Use Authorization (EUA). This EUA will remain in effect (meaning this test can be used) for the duration of the COVID-19 declaration under Section 564(b)(1) of the Act, 21 U.S.C. section 360bbb-3(b)(1), unless the authorization is terminated or revoked.  Performed at [facility], [place] [address] [place], [place] [address]   C Difficile Quick Screen w PCR reflex     Status: None   Collection Time: 04/03/22  2:53 PM   Specimen: Nasal Mucosa; Stool  Result Value Ref Range Status   C Diff antigen NEGATIVE NEGATIVE Final   C Diff toxin NEGATIVE NEGATIVE Final   C Diff interpretation No C. difficile detected.  Final    Comment: Performed at [facility], [place] [address] [place], [place] [address]  Gastrointestinal Panel by PCR , Stool     Status: None   Collection Time: 04/03/22  2:54 PM   Specimen: Nasal Mucosa; Stool  Result Value Ref Range Status   Campylobacter species NOT DETECTED NOT DETECTED Final   Plesimonas shigelloides NOT DETECTED NOT DETECTED Final   Salmonella species NOT DETECTED NOT DETECTED Final   Yersinia enterocolitica NOT DETECTED NOT DETECTED Final   Vibrio species NOT DETECTED NOT DETECTED Final   Vibrio cholerae NOT DETECTED NOT DETECTED Final   Enteroaggregative E coli (EAEC) NOT DETECTED NOT DETECTED Final   Enteropathogenic E coli (EPEC) NOT DETECTED NOT DETECTED Final  Enterotoxigenic E coli (ETEC) NOT DETECTED NOT DETECTED Final   Shiga like toxin producing E coli (STEC) NOT DETECTED NOT DETECTED Final   Shigella/Enteroinvasive E coli (EIEC) NOT DETECTED NOT DETECTED Final   Cryptosporidium NOT DETECTED NOT DETECTED Final    Cyclospora cayetanensis NOT DETECTED NOT DETECTED Final   Entamoeba histolytica NOT DETECTED NOT DETECTED Final   Giardia lamblia NOT DETECTED NOT DETECTED Final   Adenovirus F40/41 NOT DETECTED NOT DETECTED Final   Astrovirus NOT DETECTED NOT DETECTED Final   Norovirus GI/GII NOT DETECTED NOT DETECTED Final   Rotavirus A NOT DETECTED NOT DETECTED Final   Sapovirus (I, II, IV, and V) NOT DETECTED NOT DETECTED Final    Comment: Performed at [facility], [address] [place], [place] [address]  Urine Culture     Status: Abnormal   Collection Time: 04/03/22  3:19 PM   Specimen: Urine, Catheterized  Result Value Ref Range Status   Specimen Description   Final    URINE, CATHETERIZED Performed at [facility] [address] [place], [place] [address]    Special Requests   Final    NONE Performed at [facility], [place] [address], [place], [place] [address]    Culture 20,000 COLONIES/mL KLEBSIELLA OXYTOCA (A)  Final   Report Status 04/05/2022 FINAL  Final   Organism ID, Bacteria KLEBSIELLA OXYTOCA (A)  Final      Susceptibility   Klebsiella oxytoca - MIC*    AMPICILLIN >=32 RESISTANT Resistant     CEFAZOLIN <=4 SENSITIVE Sensitive     CEFEPIME <=0.12 SENSITIVE Sensitive     CEFTRIAXONE <=0.25 SENSITIVE Sensitive     CIPROFLOXACIN <=0.25 SENSITIVE Sensitive     GENTAMICIN <=1 SENSITIVE Sensitive     IMIPENEM <=0.25 SENSITIVE Sensitive     NITROFURANTOIN <=16 SENSITIVE Sensitive     TRIMETH/SULFA <=20 SENSITIVE Sensitive     AMPICILLIN/SULBACTAM 8 SENSITIVE Sensitive     PIP/TAZO <=4 SENSITIVE Sensitive     * 20,000 COLONIES/mL KLEBSIELLA OXYTOCA  MRSA Next Gen by PCR, Nasal     Status: None   Collection Time: 04/03/22  8:36 PM   Specimen: Nasal Mucosa; Nasal Swab  Result Value Ref Range Status   MRSA by PCR Next Gen NOT DETECTED NOT DETECTED Final    Comment: (NOTE) The GeneXpert MRSA Assay (FDA approved for NASAL specimens  only), is one component of a comprehensive MRSA colonization surveillance program. It is not intended to diagnose MRSA infection nor to guide or monitor treatment for MRSA infections. Test performance is not FDA approved in patients less than 48 years old. Performed at [facility], [place] [address] [place], [place] [address]   Culture, blood (Routine X 2) w Reflex to ID Panel     Status: None (Preliminary result)   Collection Time: 04/08/22  9:30 AM   Specimen: BLOOD  Result Value Ref Range Status   Specimen Description   Final    BLOOD SITE NOT SPECIFIED Performed at [facility] [address], [place], [place] [address]    Special Requests   Final    BOTTLES DRAWN AEROBIC AND ANAEROBIC Blood Culture adequate volume Performed at [facility] [address], [place], [place] [address]    Culture   Final    NO GROWTH 2 DAYS Performed at [facility] [address], [place], [place] [address]    Report Status PENDING  Incomplete  Culture, blood (  Routine X 2) w Reflex to ID Panel     Status: None (Preliminary result)   Collection Time: 04/08/22  9:59 AM   Specimen: BLOOD  Result Value Ref Range Status   Specimen Description   Final    BLOOD Performed at [facility] [address], [place], [place] [address]    Special Requests   Final    BLOOD Performed at [facility], [place] [address] [place], [place] [address]    Culture   Final    NO GROWTH 2 DAYS Performed at [facility] [address], [place], [place] [address]    Report Status PENDING  Incomplete  Aerobic/Anaerobic Culture w Gram Stain (surgical/deep wound)     Status: None (Preliminary result)   Collection Time: 04/09/22  4:24 PM   Specimen: Abscess  Result Value Ref Range Status   Specimen Description   Final    ABSCESS Performed at [facility] [address] [place],  [place] [address]    Special Requests ABDOMEN  Final   Gram Stain   Final    ABUNDANT WBC PRESENT, PREDOMINANTLY PMN ABUNDANT GRAM NEGATIVE RODS FEW GRAM POSITIVE COCCI IN PAIRS    Culture   Final    TOO YOUNG TO READ Performed at [facility], [place] [address], [place],  [address]    Report Status PENDING  Incomplete      Studies:  CT GUIDED VISCERAL FLUID DRAIN BY PERC CATH  Result Date: 04/09/2022 INDICATION: 61 year old female with a history of pelvic abscess referred for drainage EXAM: CT GUIDED DRAINAGE OF PELVIC ABSCESS MEDICATIONS: The patient is currently admitted to the hospital and receiving intravenous antibiotics. The antibiotics were administered within an appropriate time frame prior to the initiation of the procedure. ANESTHESIA/SEDATION: 2.0 mg IV Versed 150 mcg IV Fentanyl Moderate Sedation Time:  22 minutes The patient was continuously monitored during the procedure by the interventional radiology nurse under my direct supervision. COMPLICATIONS: None TECHNIQUE: Informed written consent was obtained from the patient after a thorough discussion of the procedural risks, benefits and alternatives. All questions were addressed. Maximal Sterile Barrier Technique was utilized including caps, mask, sterile gowns, sterile gloves, sterile drape, hand hygiene and skin antiseptic. A timeout was performed prior to the initiation of the procedure. PROCEDURE: Patient was position prone on the CT gantry table. Scout CT was acquired for planning purposes. The left gluteal region was prepped with Chlorhexidine in a sterile fashion, and a sterile drape was applied covering the operative field. A sterile gown and sterile gloves were used for the procedure. Local anesthesia was provided with 1% Lidocaine. Using CT guidance, a trocar needle was passed in a transgluteal approach, avoiding the major vasculature, and targeting the fluid and gas collection within the left pelvis. Once we confirmed  needle tip position, stylet was removed and a wire was passed into the collection. Modified Seldinger technique was used to place a 10 French pigtail catheter. Aspiration approximately 60 cc of frankly feculent/purulent material was performed. Sample sent for culture. The drain was sutured in position and attached to bulb suction. Final CT was acquired. Patient tolerated the procedure well and remained hemodynamically stable throughout. No complications were encountered and no significant blood loss. FINDINGS: Scout CT again demonstrates fluid and gas collection within the left pelvis. Left transgluteal approach was performed avoiding the major vasculature within the left hemipelvis. Pathologic lymphadenopathy is again noted. Final CT demonstrates decompression of the  fluid and gas collection of the left pelvis. IMPRESSION: Status post CT-guided left transgluteal drain for left pelvic abscess. Signed, Dulcy Fanny. Nadene Rubins, RPVI [facility] Electronically Signed   By: Corrie Mckusick D.O.   On: 04/09/2022 16:53    Assessment: 61 y.o. female   Sepsis secondary to Ecoli colitis and bacteremia Acute colitis with sigmoid colon perforation  Metabolic encephalopathy due to sepsis, resolved Left-sided colon cancer, on chemotherapy Normocytic anemia and thrombocytopenia, due to underlying malignancy and chemotherapy Liver cirrhosis, HCV infection   Plan:  -s/p pelvic abscess draining, on IV antibiotics cefepime, cultures pending. -She was started a confused appointment, not sure if it'is related to pain medication -infection management per primary and ID team, appreciate their care and also surgical team's assistance  -she will be off chemo for a while, until she recovers well from this infection. Unfortunately she may not be able to tolerate intensive chemotherapy down the road.  Her cancer is not progressed.  Her overall prognosis is poor. -I  will f/u as needed next week.    Truitt Merle, MD 04/10/2022

## 2022-04-10 NOTE — Evaluation (Signed)
Physical Therapy Evaluation Patient Details Name: Angela Mcpherson MRN: [id number] DOB: [birth year] Today's Date: 04/10/2022  History of Present Illness  Patient 61 y.o. female presented 04/03/2022 with abdominal pain, confusion, lethargy, and loose stools.  She underwent CT imaging in the emergency department which showed wall thickening of the colon consistent with colitis.  She also had a distended urinary bladder consistent with urinary retention.  C. difficile and GI pathogen panel testing were both negative.  Her blood cultures have grown E. coli and she is currently on ceftriaxone.  She has been afebrile since 8/19 at 8 PM on current antibiotics. 04/09/22 drain placed for pelvic abscess. 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  Clinical Impression  Pt admitted with above diagnosis. Assisted pt with bedside commode to bed transfer with min hand held assist for balance. Pt declined ambulation 2* pain in L gluteal region.  Pt currently with functional limitations due to the deficits listed below (see PT Problem List). Pt will benefit from skilled PT to increase their independence and safety with mobility to allow discharge to the venue listed below.          Recommendations for follow up therapy are one component of a multi-disciplinary discharge planning process, led by the attending physician.  Recommendations may be updated based on patient status, additional functional criteria and insurance authorization.  Follow Up Recommendations Home health PT      Assistance Recommended at Discharge Intermittent Supervision/Assistance  Patient can return home with the following  A little help with walking and/or transfers;A little help with bathing/dressing/bathroom;Assistance with cooking/housework;Assist for transportation    Equipment Recommendations Rolling walker (2 wheels)  Recommendations for Other Services       Functional  Status Assessment Patient has had a recent decline in their functional status and demonstrates the ability to make significant improvements in function in a reasonable and predictable amount of time.     Precautions / Restrictions Precautions Precautions: Fall Precaution Comments: monitor HR Restrictions Weight Bearing Restrictions: No      Mobility  Bed Mobility               General bed mobility comments: up on bedside commode    Transfers Overall transfer level: Needs assistance Equipment used: 1 person hand held assist Transfers: Sit to/from Stand, Bed to chair/wheelchair/BSC Sit to Stand: Min assist           General transfer comment: transferred bedside commode to bed, then took 2 side steps at edge of bed, min A for balance 2* posterior lean    Ambulation/Gait                  Stairs            Wheelchair Mobility    Modified Rankin (Stroke Patients Only)       Balance Overall balance assessment: Needs assistance Sitting-balance support: Feet supported, No upper extremity supported Sitting balance-Leahy Scale: Good     Standing balance support: Single extremity supported Standing balance-Leahy Scale: Poor Standing balance comment: posterior lean in standing                             Pertinent Vitals/Pain Pain Assessment Pain Assessment: 0-10 Pain Score: 7  Pain Location: L gluteal area Pain Descriptors / Indicators: Sore Pain Intervention(s): Limited activity within patient's tolerance, Monitored during session, Premedicated before session, Repositioned    Home Living Family/patient expects  to be discharged to:: Private residence Living Arrangements: Spouse/significant other Available Help at Discharge: Family;Available PRN/intermittently Type of Home: House Home Access: Stairs to enter       Home Layout: One level;Laundry or work area in [employer]: None Additional Comments: bidet    Prior  Function Prior Level of Function : Independent/Modified Independent                     Hand Dominance   Dominant Hand: Right    Extremity/Trunk Assessment   Upper Extremity Assessment Upper Extremity Assessment: Overall WFL for tasks assessed    Lower Extremity Assessment Lower Extremity Assessment: Overall WFL for tasks assessed    Cervical / Trunk Assessment Cervical / Trunk Assessment: Normal  Communication   Communication: No difficulties  Cognition Arousal/Alertness: Awake/alert Behavior During Therapy: WFL for tasks assessed/performed Overall Cognitive Status: Within Functional Limits for tasks assessed                                 General Comments: difficulty staying on task, multiple verbal/manual cues required to follow commands        General Comments      Exercises     Assessment/Plan    PT Assessment Patient needs continued PT services  PT Problem List Decreased activity tolerance;Decreased balance;Decreased mobility;Pain       PT Treatment Interventions DME instruction;Therapeutic activities;Gait training;Functional mobility training;Patient/family education    PT Goals (Current goals can be found in the Care Plan section)  Acute Rehab PT Goals Patient Stated Goal: get home and keep doing her exercises PT Goal Formulation: With patient Time For Goal Achievement: 04/24/22 Potential to Achieve Goals: Good    Frequency Min 3X/week     Co-evaluation               AM-PAC PT "6 Clicks" Mobility  Outcome Measure Help needed turning from your back to your side while in a flat bed without using bedrails?: A Little Help needed moving from lying on your back to sitting on the side of a flat bed without using bedrails?: A Little Help needed moving to and from a bed to a chair (including a wheelchair)?: A Little Help needed standing up from a chair using your arms (e.g., wheelchair or bedside chair)?: A Little Help needed  to walk in hospital room?: A Little Help needed climbing 3-5 steps with a railing? : A Lot 6 Click Score: 17    End of Session   Activity Tolerance: Patient limited by pain Patient left: in bed;with bed alarm set;with call bell/phone within reach Nurse Communication: Mobility status PT Visit Diagnosis: Unsteadiness on feet (R26.81);Difficulty in walking, not elsewhere classified (R26.2)    Time: 0981-1914 PT Time Calculation (min) (ACUTE ONLY): 10 min   Charges:   PT Evaluation $PT Eval Moderate Complexity: 1 Mod          Philomena Doheny PT 04/10/2022  Acute Rehabilitation Services  Office [phone]

## 2022-04-10 NOTE — Progress Notes (Signed)
Nutrition Follow-up  DOCUMENTATION CODES:   Non-severe (moderate) malnutrition in context of chronic illness  INTERVENTION:  - continue Boost Breeze BID and Ensure Plus High Protein BID. - diet advancement as medically feasible.  - weigh patient today.    NUTRITION DIAGNOSIS:   Moderate Malnutrition related to chronic illness, cancer and cancer related treatments as evidenced by moderate fat depletion, moderate muscle depletion. -ongoing  GOAL:   Patient will meet greater than or equal to 90% of their needs -unable to meet currently  MONITOR:   PO intake, Supplement acceptance, Diet advancement, Labs, Weight trends  ASSESSMENT:   61 y.o. female with medical history of for aortic mural thrombus and rectal cancer on oral chemo. She presented to the ED due to abdominal pain and confusion. Her last chemo was 2 days PTA. At home it was noted that she was lethargic and having loose stools. In the ED there was concern for colitis so she was admitted for further management. Patient admitted with dx of sepsis.  Diet advanced from CLD to Soft on 8/18 at 1350. She ate 50% of breakfast and 0% of lunch and dinner on 8/20; 0% of breakfast and lunch and 50% of dinner on 8/21. Diet changed back to CLD on 8/22 at 0850 and to NPO on 8/22 at 1410 before being advanced back to Soft yesterday at 1640 and she ate 25% of dinner. Diet was changed to Dysphagia 1, thin liquids today at 1102.   Patient enjoys oral nutrition supplements and has been focusing on consuming these each time they are offered.   She has not been weighed since admission on 8/18. Mild pitting edema to BLE documented in the edema section of flow sheet yesterday AM and PM.   Surgery team note from today reviewed. No plan for surgery at this time. No peritonitis noted at this time.   Yesterday (8/23) she underwent L transgluteal drain placement by IR.    Labs reviewed; K: 2.8 mmol/l, Cl: 115 mmol/l, Ca: 7.7 mg/dl, Mg: 1.5  mg/dl.  Medications reviewed; 2 g IV Mg sulfate x1 run 8/24, 1 tablet multivitamin with minerals/day, 60 mEq Klor-Con x2 doses 8/24, 10 mEq IV KCl x4 runs 8/23.   Diet Order:   Diet Order             DIET - DYS 1 Room service appropriate? Yes; Fluid consistency: Thin  Diet effective now                   EDUCATION NEEDS:   Education needs have been addressed  Skin:  Skin Assessment: Reviewed RN Assessment  Last BM:  8/24 (type 7 x1, large amount)  Height:   Ht Readings from Last 1 Encounters:  04/04/22 '5\' 4"'$  (1.626 m)    Weight:   Wt Readings from Last 1 Encounters:  04/04/22 49.3 kg     BMI:  Body mass index is 18.66 kg/m.  Estimated Nutritional Needs:  Kcal:  1700-2000 kcal Protein:  85-95 grams Fluid:  >/= 2 L/day     Jarome Matin, MS, RD, LDN, CNSC Registered Dietitian II Inpatient Clinical Nutrition RD pager # and on-call/weekend pager # available in [facility]

## 2022-04-10 NOTE — Progress Notes (Signed)
  Progress Note   Patient: Angela Mcpherson TKW:[id number] DOB: 01/06/1961 DOA: 04/03/2022     7 DOS: the patient was seen and examined on 04/10/2022 at 12:15PM      Brief hospital course: Angela Mcpherson is a 61 y.o. 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     8/17: Admitted on antibiotics for colitis 8/18: Blood cultures growing E coli 8/21: WBC rising, ID consulted 8/22: Still worsening, CT shows new left sided abscess, not present on admission; Gen Surg and IR consulted 8/23: Underwent perc drain placement     Assessment and Plan: * Severe sepsis ([place]) E coli and citrobacter bacteremia Pelvic abscess in female  Blood cultures with E coli and Citrobacter brakii, urine culture with Klebsiella, all CTX susceptible  Improving WBC and symptoms after perc drain placement yesterday  - Continue cefepime, flagyl - Consult ID, appreciate cares - Follow culture data  - Continue foley for urinary retention      Coagulopathy ([place]) Present on admission, INR 4.  Due to liver disease - Hold apixaban - Follow INR    Hypok Still <3 - Supp K     Acute urinary retention Left hydronephrosis due to mass - Continue foley    Aortic mural Thrombus - Hold Eliquis given coagulapathy               Subjective: Pain poorly controlled, no fever, confusion, vomiting, diarrhea.  No respiratory distress.     Physical Exam: Vitals:   04/10/22 0933 04/10/22 1247 04/10/22 1303 04/10/22 1647  BP: 101/66  114/75 123/80  Pulse: 100  (!) 110 (!) 110  Resp: '18  17 18  '$ Temp: 97.7 F (36.5 C)  98.1 F (36.7 C) 98.3 F (36.8 C)  TempSrc:   Oral Oral  SpO2: 97%  98% 96%  Weight:  62.7 kg    Height:       Thin adult female, lying in bed, appears uncomfortable Tachycardic, no murmurs, no peripheral edema Respiratory rate slightly increased, lungs clear without  rales or wheezes Abdomen tender, somewhat distended with ascites still, more tense than yesterday, no guarding noted throughout Attention normal, affect blunted, judgment insight appear normal, face symmetric, speech fluent       Data Reviewed: Potassium still 2.8, hemoglobin down to 8.9, slight decrease, platelets 84, no change Bicarb slightly low, creatinine and sodium normal INR 2.2, no change  Family Communication: Husband at the bedside    Disposition: Status is: Inpatient The patient was admitted with sepsis  She has been found to have gram-negative rod bacteremia from a pelvic abscess           Author: Edwin Dada, MD 04/10/2022 8:01 PM  For on call review [email or web address].

## 2022-04-10 NOTE — Progress Notes (Signed)
Patient having increased abdominal pain. KUB was obtained and demonstrates worsening bowel distension. Plan to keep NPO for now, continue pain-control, consider NGT but no N/V currently.

## 2022-04-10 NOTE — Progress Notes (Signed)
Progress Note     Subjective: Still with some abdominal pain in LLQ, not eating much but likes the peach Breeze and Ensure.   Objective: Vital signs in last 24 hours: Temp:  [97.4 F (36.3 C)-99.3 F (37.4 C)] 97.7 F (36.5 C) (08/24 0933) Pulse Rate:  [100-123] 100 (08/24 0933) Resp:  [13-22] 18 (08/24 0933) BP: (101-125)/(64-77) 101/66 (08/24 0933) SpO2:  [95 %-100 %] 97 % (08/24 0933) Last BM Date : 04/09/22  Intake/Output from previous day: 08/23 0701 - 08/24 0700 In: 3417.1 [P.O.:600; I.V.:1090.9; IV Piggyback:1726.2] Out: 785 [Urine:700; Drains:25] Intake/Output this shift: No intake/output data recorded.  PE: General:  NAD Lungs: Respiratory effort nonlabored Abd: soft, but distended, drain noted on left side with minimal output.  What I can see appears more purulent in nature.  Tender throughout but minimal, except more in LLQ    Lab Results:  Recent Labs    04/09/22 0423 04/10/22 0410  WBC 21.3* 16.4*  HGB 9.4* 8.9*  HCT 27.3* 25.4*  PLT 84* 85*   BMET Recent Labs    04/09/22 0423 04/09/22 1252 04/10/22 0410  NA 135  --  136  K 2.6* 3.2* 2.8*  CL 107  --  115*  CO2 20*  --  18*  GLUCOSE 111*  --  120*  BUN 11  --  10  CREATININE 0.54  --  0.49  CALCIUM 7.7*  --  7.7*   PT/INR Recent Labs    04/09/22 0423 04/10/22 0410  LABPROT 24.5* 24.1*  INR 2.2* 2.2*   CMP     Component Value Date/Time   NA 136 04/10/2022 0410   K 2.8 (L) 04/10/2022 0410   CL 115 (H) 04/10/2022 0410   CO2 18 (L) 04/10/2022 0410   GLUCOSE 120 (H) 04/10/2022 0410   BUN 10 04/10/2022 0410   CREATININE 0.49 04/10/2022 0410   CREATININE 0.55 03/24/2022 1029   CALCIUM 7.7 (L) 04/10/2022 0410   PROT 4.4 (L) 04/10/2022 0410   ALBUMIN 1.7 (L) 04/10/2022 0410   AST 23 04/10/2022 0410   AST 28 03/24/2022 1029   ALT 20 04/10/2022 0410   ALT 19 03/24/2022 1029   ALKPHOS 73 04/10/2022 0410   BILITOT 1.7 (H) 04/10/2022 0410   BILITOT 1.8 (H) 03/24/2022 1029    GFRNONAA >60 04/10/2022 0410   GFRNONAA >60 03/24/2022 1029   GFRAA  06/26/2010 0755    >60        The eGFR has been calculated using the MDRD equation. This calculation has not been validated in all clinical situations. eGFR's persistently <60 mL/min signify possible Chronic Kidney Disease.   Lipase     Component Value Date/Time   LIPASE 44 04/03/2022 1126       Studies/Results: CT GUIDED VISCERAL FLUID DRAIN BY PERC CATH  Result Date: 04/09/2022 INDICATION: 61 year old female with a history of pelvic abscess referred for drainage EXAM: CT GUIDED DRAINAGE OF PELVIC ABSCESS MEDICATIONS: The patient is currently admitted to the hospital and receiving intravenous antibiotics. The antibiotics were administered within an appropriate time frame prior to the initiation of the procedure. ANESTHESIA/SEDATION: 2.0 mg IV Versed 150 mcg IV Fentanyl Moderate Sedation Time:  22 minutes The patient was continuously monitored during the procedure by the interventional radiology nurse under my direct supervision. COMPLICATIONS: None TECHNIQUE: Informed written consent was obtained from the patient after a thorough discussion of the procedural risks, benefits and alternatives. All questions were addressed. Maximal Sterile Barrier Technique was utilized including  caps, mask, sterile gowns, sterile gloves, sterile drape, hand hygiene and skin antiseptic. A timeout was performed prior to the initiation of the procedure. PROCEDURE: Patient was position prone on the CT gantry table. Scout CT was acquired for planning purposes. The left gluteal region was prepped with Chlorhexidine in a sterile fashion, and a sterile drape was applied covering the operative field. A sterile gown and sterile gloves were used for the procedure. Local anesthesia was provided with 1% Lidocaine. Using CT guidance, a trocar needle was passed in a transgluteal approach, avoiding the major vasculature, and targeting the fluid and gas  collection within the left pelvis. Once we confirmed needle tip position, stylet was removed and a wire was passed into the collection. Modified Seldinger technique was used to place a 10 French pigtail catheter. Aspiration approximately 60 cc of frankly feculent/purulent material was performed. Sample sent for culture. The drain was sutured in position and attached to bulb suction. Final CT was acquired. Patient tolerated the procedure well and remained hemodynamically stable throughout. No complications were encountered and no significant blood loss. FINDINGS: Scout CT again demonstrates fluid and gas collection within the left pelvis. Left transgluteal approach was performed avoiding the major vasculature within the left hemipelvis. Pathologic lymphadenopathy is again noted. Final CT demonstrates decompression of the fluid and gas collection of the left pelvis. IMPRESSION: Status post CT-guided left transgluteal drain for left pelvic abscess. Signed, Dulcy Fanny. Nadene Rubins, RPVI [facility] Radiology Electronically Signed   By: Corrie Mckusick D.O.   On: 04/09/2022 16:53   CT ABDOMEN PELVIS W CONTRAST  Result Date: 04/08/2022 CLINICAL DATA:  Sepsis, persistent fever, abdominal pain. * Tracking Code: BO * EXAM: CT ABDOMEN AND PELVIS WITH CONTRAST TECHNIQUE: Multidetector CT imaging of the abdomen and pelvis was performed using the standard protocol following bolus administration of intravenous contrast. RADIATION DOSE REDUCTION: This exam was performed according to the departmental dose-optimization program which includes automated exposure control, adjustment of the mA and/or kV according to patient size and/or use of iterative reconstruction technique. CONTRAST:  17m OMNIPAQUE IOHEXOL 300 MG/ML  SOLN COMPARISON:  None Available. FINDINGS: Lower chest: Bibasilar atelectasis small effusions. Hepatobiliary: No focal hepatic lesion. New large volume of fluid  surrounds the margin of the liver extends along the RIGHT pericolic gutter. Gallbladder distended without evidence inflammation. No biliary duct dilatation. Portal veins are patent. Pancreas: Pancreas is normal. No ductal dilatation. No pancreatic inflammation. Spleen: Normal spleen Adrenals/urinary tract: Adrenal glands normal. There is mild hydronephrosis hydroureter on the LEFT. Obstructive LEFT ureter likely related to abscess formation in the LEFT pelvis. Stomach/Bowel: Stomach proximal small bowel normal. Contrast flows proximal half way through the small bowel. There is multiple air-fluid levels within the small bowel without significant dilatation. The ascending and transverse colon gas-filled relatively normal. No evidence of colitis seen on comparison exam. Within the LEFT lower quadrant just superior to the operator space is a contained fluid collection which has fluid and gas measuring 4.5 x 6.7 cm (image 82/2). Favor contained perforation of the bowel at rectosigmoid level small wiht extraluminal gas noted on image 79/2. No significant intraperitoneal free air. There is a large volume intraperitoneal free fluid. Margin of the free fluid is enhancing in the LEFT pericolic gutter (image 593/7 consistent peritonitis. Again demonstrated a mass at the rectosigmoid junction. Vascular/Lymphatic: Abdominal aorta is normal caliber with intimal calcification. Lymph node deep to the operator space again noted on the LEFT measuring 3.1 x 2.6  cm Reproductive: Grossly unremarkable Other: None Musculoskeletal: No aggressive osseous lesion. IMPRESSION: 1. Gas and fluid collection in the deep LEFT pelvis most consistent with perforation of the rectosigmoid colon with contained abscess formation. 2. New large volume intraperitoneal free fluid. Evidence of peritonitis associated the free fluid. 3. Small bowel ileus. 4. No large volume intraperitoneal free air. 5. New mild hydro nephrosis and hydroureter on the LEFT  presumed related to obstruction of LEFT ureter by the LEFT pelvic abscess. [facility] on 04/08/2022  at13:59. Electronically Signed   By: Suzy Bouchard M.D.   On: 04/08/2022 14:01    Anti-infectives: Anti-infectives (From admission, onward)    Start     Dose/Rate Route Frequency Ordered Stop   04/08/22 2100  ceFEPIme (MAXIPIME) 2 g in sodium chloride 0.9 % 100 mL IVPB        2 g 200 mL/hr over 30 Minutes Intravenous Every 12 hours 04/08/22 1453     04/08/22 1600  metroNIDAZOLE (FLAGYL) IVPB 500 mg        500 mg 100 mL/hr over 60 Minutes Intravenous Every 12 hours 04/08/22 1453     04/08/22 1515  piperacillin-tazobactam (ZOSYN) IVPB 3.375 g  Status:  Discontinued        3.375 g 12.5 mL/hr over 240 Minutes Intravenous Every 8 hours 04/08/22 1428 04/08/22 1447   04/07/22 2000  ceFEPIme (MAXIPIME) 2 g in sodium chloride 0.9 % 100 mL IVPB  Status:  Discontinued        2 g 200 mL/hr over 30 Minutes Intravenous Every 12 hours 04/07/22 1600 04/08/22 1428   04/04/22 0900  cefTRIAXone (ROCEPHIN) 2 g in sodium chloride 0.9 % 100 mL IVPB  Status:  Discontinued        2 g 200 mL/hr over 30 Minutes Intravenous Every 24 hours 04/04/22 0257 04/07/22 1600   04/03/22 1700  ceFEPIme (MAXIPIME) 2 g in sodium chloride 0.9 % 100 mL IVPB  Status:  Discontinued        2 g 200 mL/hr over 30 Minutes Intravenous Every 8 hours 04/03/22 1634 04/04/22 0257   04/03/22 1500  metroNIDAZOLE (FLAGYL) IVPB 500 mg  Status:  Discontinued        500 mg 100 mL/hr over 60 Minutes Intravenous Every 12 hours 04/03/22 1446 04/04/22 0257   04/03/22 1045  ceFEPIme (MAXIPIME) 2 g in sodium chloride 0.9 % 100 mL IVPB        2 g 200 mL/hr over 30 Minutes Intravenous  Once 04/03/22 1031 04/03/22 1429   04/03/22 1045  vancomycin (VANCOCIN) IVPB 1000 mg/200 mL premix        1,000 mg 200 mL/hr over 60 Minutes Intravenous  Once 04/03/22 1031 04/03/22 1241        Assessment/Plan Metastatic colorectal  cancer and chemotherapy, last dose 1 week ago.  Admission with diarrhea and colitis on antibiotics. New pelvic abscess concern for contained perforation at cancer.  - no indication for emergent surgical intervention - no peritonitis and not a good surgical candidate given malnourishment and recent chemotherapy.  -IR drain on 8/23 -cx gram - rods, gram + cocci  - WBC down to 16K today, AF last 24 hrs. Continue abx - ID following -soft diet, protein shakes  FEN: soft diet, protein shakes ID: [email or web address] VTE: eliquis held (last dose yesterday am)  I reviewed Consultant ID notes, hospitalist notes, last 24 h vitals and pain scores, last 48 h intake and output, last 24 h labs and trends,  and last 24 h imaging results.    LOS: 7 days   Henreitta Cea, [facility] Surgery 04/10/2022, 9:42 AM Please see Amion for pager number during day hours 7:00am-4:30pm

## 2022-04-10 NOTE — Progress Notes (Signed)
Referring Physician(s): Gross, S  Supervising Physician: Michaelle Birks  Patient Status:  [facility] - In-pt  Chief Complaint:  Pelvic abscess s/p L transgluteal drain placement   Subjective:  Pt sitting upright in bed. She is A&O. She is in no distress.  She states she feels better and has not required pain medication today. Pt denies pain, N/V, chills/fever. She is eating and drinking normally.   Allergies: Codeine, Epinephrine (anaphylaxis), Orange fruit [citrus], Streptogramins, and Tomato  Medications: Prior to Admission medications   Medication Sig Start Date End Date Taking? Authorizing Provider  acetaminophen (TYLENOL) 325 MG tablet Take 650 mg by mouth every 6 (six) hours as needed for moderate pain.   Yes [provider]  capecitabine (XELODA) 500 MG tablet TAKE 2 TABLETS EVERY 12 HOURS  FOR 14 DAYS ON THEN 7 DAYS OFF, TAKE WITH MEALS Patient taking differently: Take 1,000 mg/m2 by mouth 2 (two) times daily after a meal. 03/24/22  Yes Truitt Merle, MD  cholecalciferol (VITAMIN D3) 25 MCG (1000 UNIT) tablet Take 1,000 Units by mouth daily.   Yes [provider]  ELIQUIS 5 MG TABS tablet TAKE 1 TABLET BY MOUTH TWICE A DAY Patient taking differently: Take 5 mg by mouth 2 (two) times daily. 03/24/22  Yes Truitt Merle, MD  KLOR-CON M20 20 MEQ tablet TAKE 1 TABLET BY MOUTH TWICE A DAY Patient taking differently: Take 20 mEq by mouth daily. 12/13/21  Yes Truitt Merle, MD  loperamide (IMODIUM) 2 MG capsule Take 1 capsule (2 mg total) by mouth as needed for diarrhea or loose stools. Patient taking differently: Take 2 mg by mouth daily as needed for diarrhea or loose stools. 05/27/21  Yes Nicole Kindred A, DO  ondansetron (ZOFRAN) 8 MG tablet Take 1 tablet (8 mg total) by mouth 2 (two) times daily as needed for refractory nausea / vomiting. Start on day 3 after chemotherapy. Patient taking differently: Take 8 mg by mouth 2 (two) times daily as needed for refractory nausea /  vomiting. 03/06/21  Yes Truitt Merle, MD  diphenoxylate-atropine (LOMOTIL) 2.5-0.025 MG tablet Take 2 tablets by mouth 4 (four) times daily as needed for diarrhea or loose stools. Patient not taking: Reported on 04/03/2022 01/06/22   Truitt Merle, MD     Vital Signs: BP 114/75 (BP Location: Left Arm)   Pulse (!) 110   Temp 98.1 F (36.7 C) (Oral)   Resp 17   Ht '5\' 4"'$  (1.626 m)   Wt 138 lb 3.7 oz (62.7 kg)   SpO2 98%   BMI 23.73 kg/m   Physical Exam Vitals reviewed.  Constitutional:      General: She is not in acute distress.    Appearance: Normal appearance. She is not ill-appearing.  HENT:     Head: Normocephalic and atraumatic.  Eyes:     Extraocular Movements: Extraocular movements intact.     Pupils: Pupils are equal, round, and reactive to light.  Pulmonary:     Effort: Pulmonary effort is normal. No respiratory distress.  Skin:    General: Skin is warm and dry.     Comments: Drain flushes/aspirates easily. Site unremarkable with sutures/statlock in place. ~ 20 cc purulent OP in JP. Dressing C/D/I.    Neurological:     Mental Status: She is alert and oriented to person, place, and time.  Psychiatric:        Mood and Affect: Mood normal.        Behavior: Behavior normal.  Thought Content: Thought content normal.        Judgment: Judgment normal.     Imaging: CT GUIDED VISCERAL FLUID DRAIN BY PERC CATH  Result Date: 04/09/2022 INDICATION: 61 year old female with a history of pelvic abscess referred for drainage EXAM: CT GUIDED DRAINAGE OF PELVIC ABSCESS MEDICATIONS: The patient is currently admitted to the hospital and receiving intravenous antibiotics. The antibiotics were administered within an appropriate time frame prior to the initiation of the procedure. ANESTHESIA/SEDATION: 2.0 mg IV Versed 150 mcg IV Fentanyl Moderate Sedation Time:  22 minutes The patient was continuously monitored during the procedure by the interventional radiology nurse under my direct  supervision. COMPLICATIONS: None TECHNIQUE: Informed written consent was obtained from the patient after a thorough discussion of the procedural risks, benefits and alternatives. All questions were addressed. Maximal Sterile Barrier Technique was utilized including caps, mask, sterile gowns, sterile gloves, sterile drape, hand hygiene and skin antiseptic. A timeout was performed prior to the initiation of the procedure. PROCEDURE: Patient was position prone on the CT gantry table. Scout CT was acquired for planning purposes. The left gluteal region was prepped with Chlorhexidine in a sterile fashion, and a sterile drape was applied covering the operative field. A sterile gown and sterile gloves were used for the procedure. Local anesthesia was provided with 1% Lidocaine. Using CT guidance, a trocar needle was passed in a transgluteal approach, avoiding the major vasculature, and targeting the fluid and gas collection within the left pelvis. Once we confirmed needle tip position, stylet was removed and a wire was passed into the collection. Modified Seldinger technique was used to place a 10 French pigtail catheter. Aspiration approximately 60 cc of frankly feculent/purulent material was performed. Sample sent for culture. The drain was sutured in position and attached to bulb suction. Final CT was acquired. Patient tolerated the procedure well and remained hemodynamically stable throughout. No complications were encountered and no significant blood loss. FINDINGS: Scout CT again demonstrates fluid and gas collection within the left pelvis. Left transgluteal approach was performed avoiding the major vasculature within the left hemipelvis. Pathologic lymphadenopathy is again noted. Final CT demonstrates decompression of the fluid and gas collection of the left pelvis. IMPRESSION: Status post CT-guided left transgluteal drain for left pelvic abscess. Signed, Dulcy Fanny. Nadene Rubins, RPVI [facility] Radiology Electronically Signed   By: Corrie Mckusick D.O.   On: 04/09/2022 16:53   CT ABDOMEN PELVIS W CONTRAST  Result Date: 04/08/2022 CLINICAL DATA:  Sepsis, persistent fever, abdominal pain. * Tracking Code: BO * EXAM: CT ABDOMEN AND PELVIS WITH CONTRAST TECHNIQUE: Multidetector CT imaging of the abdomen and pelvis was performed using the standard protocol following bolus administration of intravenous contrast. RADIATION DOSE REDUCTION: This exam was performed according to the departmental dose-optimization program which includes automated exposure control, adjustment of the mA and/or kV according to patient size and/or use of iterative reconstruction technique. CONTRAST:  169m OMNIPAQUE IOHEXOL 300 MG/ML  SOLN COMPARISON:  None Available. FINDINGS: Lower chest: Bibasilar atelectasis small effusions. Hepatobiliary: No focal hepatic lesion. New large volume of fluid surrounds the margin of the liver extends along the RIGHT pericolic gutter. Gallbladder distended without evidence inflammation. No biliary duct dilatation. Portal veins are patent. Pancreas: Pancreas is normal. No ductal dilatation. No pancreatic inflammation. Spleen: Normal spleen Adrenals/urinary tract: Adrenal glands normal. There is mild hydronephrosis hydroureter on the LEFT. Obstructive LEFT ureter likely related to abscess formation in the LEFT pelvis. Stomach/Bowel: Stomach proximal small bowel  normal. Contrast flows proximal half way through the small bowel. There is multiple air-fluid levels within the small bowel without significant dilatation. The ascending and transverse colon gas-filled relatively normal. No evidence of colitis seen on comparison exam. Within the LEFT lower quadrant just superior to the operator space is a contained fluid collection which has fluid and gas measuring 4.5 x 6.7 cm (image 82/2). Favor contained perforation of the bowel at rectosigmoid level small wiht extraluminal gas  noted on image 79/2. No significant intraperitoneal free air. There is a large volume intraperitoneal free fluid. Margin of the free fluid is enhancing in the LEFT pericolic gutter (image 32/3) consistent peritonitis. Again demonstrated a mass at the rectosigmoid junction. Vascular/Lymphatic: Abdominal aorta is normal caliber with intimal calcification. Lymph node deep to the operator space again noted on the LEFT measuring 3.1 x 2.6 cm Reproductive: Grossly unremarkable Other: None Musculoskeletal: No aggressive osseous lesion. IMPRESSION: 1. Gas and fluid collection in the deep LEFT pelvis most consistent with perforation of the rectosigmoid colon with contained abscess formation. 2. New large volume intraperitoneal free fluid. Evidence of peritonitis associated the free fluid. 3. Small bowel ileus. 4. No large volume intraperitoneal free air. 5. New mild hydro nephrosis and hydroureter on the LEFT presumed related to obstruction of LEFT ureter by the LEFT pelvic abscess. [facility] on 04/08/2022  at13:59. Electronically Signed   By: Suzy Bouchard M.D.   On: 04/08/2022 14:01   DG Abd 1 View  Result Date: 04/08/2022 CLINICAL DATA:  61 year old female history of abdominal distension. EXAM: ABDOMEN - 1 VIEW COMPARISON:  No prior abdominal radiograph. CT of the abdomen and pelvis 04/03/2022. FINDINGS: Diffuse gaseous distension is noted throughout the small bowel and colon. Small bowel loops measure up to 4.1 cm in the central abdomen. Rectal gas is noted. No definite pneumoperitoneum confidently identified. IMPRESSION: Abnormal bowel-gas pattern, most suggestive of an ileus, as above. Electronically Signed   By: Vinnie Langton M.D.   On: 04/08/2022 07:07    Labs:  CBC: Recent Labs    04/07/22 0332 04/08/22 0419 04/09/22 0423 04/10/22 0410  WBC 16.6* 16.8* 21.3* 16.4*  HGB 10.5* 11.7* 9.4* 8.9*  HCT 30.5* 34.1* 27.3* 25.4*  PLT 60* 92* 84* 85*    COAGS: Recent Labs     05/18/21 2044 05/19/21 0618 04/04/22 0336 04/08/22 1517 04/09/22 0423 04/10/22 0410  INR 1.3*   < > 4.0* 2.8* 2.2* 2.2*  APTT 24  --   --   --   --   --    < > = values in this interval not displayed.    BMP: Recent Labs    04/07/22 0332 04/08/22 0419 04/09/22 0423 04/09/22 1252 04/10/22 0410  NA 136 133* 135  --  136  K 3.8 3.6 2.6* 3.2* 2.8*  CL 112* 107 107  --  115*  CO2 20* 20* 20*  --  18*  GLUCOSE 120* 96 111*  --  120*  BUN '15 13 11  '$ --  10  CALCIUM 7.7* 7.9* 7.7*  --  7.7*  CREATININE 0.63 0.56 0.54  --  0.49  GFRNONAA >60 >60 >60  --  >60    LIVER FUNCTION TESTS: Recent Labs    04/07/22 0332 04/08/22 0419 04/09/22 0423 04/10/22 0410  BILITOT 1.9* 2.4* 2.1* 1.7*  AST 42* 54* 32 23  ALT 28 33 25 20  ALKPHOS 81 95 82 73  PROT 4.6* 4.7* 4.7* 4.4*  ALBUMIN 2.1* 2.0*  1.9* 1.7*    Assessment and Plan:  Pelvic abscess s/p L transgluteal drain placement   Pt sitting upright in bed. She is A&O. She is in no distress.  She states she feels better and has not required pain medication today. Pt denies pain, N/V, chills/fever. She is eating and drinking normally.   Drain flushes/aspirates easily. Site unremarkable with sutures/statlock in place. ~ 20 cc purulent OP in JP. Dressing C/D/I.  Documented 25 cc OP over last 24 hrs.   WBC 16.4 (21.3), afebrile, VSS Cx results @ 24 hrs:  ABUNDANT WBC PRESENT, PREDOMINANTLY PMN  ABUNDANT GRAM NEGATIVE RODS  FEW GRAM POSITIVE COCCI IN PAIRS   Culture TOO YOUNG TO READ  Performed at [facility], [address] [place], [place] [address]    Plan:  Continue TID flushes with 5 cc NS. Record output Q shift. Dressing changes QD or PRN if soiled.  Call IR APP or on call IR MD if difficulty flushing or sudden change in drain output.  Repeat imaging/possible drain injection once output < 10 mL/QD (excluding flush material.)   Discharge planning: Please contact IR APP or on call IR MD prior to patient d/c to  ensure appropriate follow up plans are in place. Typically patient will follow up with IR clinic 10-14 days post d/c for repeat imaging/possible drain injection. IR scheduler will contact patient with date/time of appointment. Patient will need to flush drain QD with 5 cc NS, record output QD, dressing changes every 2-3 days or earlier if soiled.    IR will continue to follow - please call with questions or concerns.   Electronically Signed: Tyson Alias, NP 04/10/2022, 3:13 PM   I spent a total of 15 Minutes at the the patient's bedside AND on the patient's hospital floor or unit, greater than 50% of which was counseling/coordinating care for pelvic abscess.

## 2022-04-10 NOTE — Significant Event (Signed)
Rapid Response Event Note   Reason for Call :  Tachycardia  Initial Focused Assessment:  Patient oriented to self, grimacing, crying out in pain, afebrile, Vital signs within expected range except for HR in 110-120 range. Chart review reveals similar documentation. Provider B. Lennox Grumbles, NP previously notified by bedside RN and fluid boluses that she ordered were in process of infusing.   Interventions:  EKG obtained showed Sinus Tachycardia. Chart reviewed and discussed with provider, patient, and bedside nursing staff. Administered Tylenol for pain since patient unable to receive Oxycodone for pain at this time. Orders given for additional IV push of Fentanyl 50mg to help with pain management thought to be contributing to tachycardia. Bedside nursing staff provided incontinence care and bed linen change.   Plan of Care:  Monitor pain management and continue current plan of care. No escalation of care required at this time.  Event Summary:   MD Notified:Lorra Hals NP Call Time: 2231 Arrival Time: 2235 End Time: 2[place] RN

## 2022-04-10 NOTE — Plan of Care (Signed)

## 2022-04-11 DIAGNOSIS — R338 Other retention of urine: Secondary | ICD-10-CM | POA: Diagnosis not present

## 2022-04-11 DIAGNOSIS — G9341 Metabolic encephalopathy: Secondary | ICD-10-CM | POA: Diagnosis not present

## 2022-04-11 DIAGNOSIS — R6520 Severe sepsis without septic shock: Secondary | ICD-10-CM | POA: Diagnosis not present

## 2022-04-11 DIAGNOSIS — A419 Sepsis, unspecified organism: Secondary | ICD-10-CM | POA: Diagnosis not present

## 2022-04-11 DIAGNOSIS — I7410 Embolism and thrombosis of unspecified parts of aorta: Secondary | ICD-10-CM | POA: Diagnosis not present

## 2022-04-11 LAB — COMPREHENSIVE METABOLIC PANEL
ALT: 17 U/L (ref 0–44)
AST: 20 U/L (ref 15–41)
Albumin: 1.7 g/dL — ABNORMAL LOW (ref 3.5–5.0)
Alkaline Phosphatase: 84 U/L (ref 38–126)
Anion gap: 3 — ABNORMAL LOW (ref 5–15)
BUN: 10 mg/dL (ref 6–20)
CO2: 19 mmol/L — ABNORMAL LOW (ref 22–32)
Calcium: 8.2 mg/dL — ABNORMAL LOW (ref 8.9–10.3)
Chloride: 115 mmol/L — ABNORMAL HIGH (ref 98–111)
Creatinine, Ser: 0.45 mg/dL (ref 0.44–1.00)
GFR, Estimated: >60 mL/min (ref >60)
Glucose, Bld: 100 mg/dL — ABNORMAL HIGH (ref 70–99)
Potassium: 3.6 mmol/L (ref 3.5–5.1)
Sodium: 137 mmol/L (ref 135–145)
Total Bilirubin: 1.7 mg/dL — ABNORMAL HIGH (ref 0.3–1.2)
Total Protein: 4.7 g/dL — ABNORMAL LOW (ref 6.5–8.1)

## 2022-04-11 LAB — CBC
HCT: 26.7 % — ABNORMAL LOW (ref 36.0–46.0)
Hemoglobin: 9.1 g/dL — ABNORMAL LOW (ref 12.0–15.0)
MCH: 34.1 pg — ABNORMAL HIGH (ref 26.0–34.0)
MCHC: 34.1 g/dL (ref 30.0–36.0)
MCV: 100 fL (ref 80.0–100.0)
Platelets: 97 10*3/uL — ABNORMAL LOW (ref 150–400)
RBC: 2.67 MIL/uL — ABNORMAL LOW (ref 3.87–5.11)
RDW: 24.6 % — ABNORMAL HIGH (ref 11.5–15.5)
WBC: 16.8 10*3/uL — ABNORMAL HIGH (ref 4.0–10.5)
nRBC: 0 % (ref 0.0–0.2)

## 2022-04-11 LAB — CULTURE, BLOOD (ROUTINE X 2)

## 2022-04-11 LAB — PROTIME-INR
INR: 2.1 — ABNORMAL HIGH (ref 0.8–1.2)
Prothrombin Time: 23.1 seconds — ABNORMAL HIGH (ref 11.4–15.2)

## 2022-04-11 MED ORDER — OXYCODONE HCL 5 MG PO TABS
10.0000 mg | ORAL_TABLET | ORAL | Status: DC | PRN
  Administered 2022-04-11 – 2022-04-12 (×2): 10 mg via ORAL
  Filled 2022-04-11 (×3): qty 2

## 2022-04-11 MED ORDER — POTASSIUM CHLORIDE IN NACL 20-0.9 MEQ/L-% IV SOLN
INTRAVENOUS | Status: AC
  Filled 2022-04-11 (×5): qty 1000

## 2022-04-11 MED ORDER — SODIUM CHLORIDE 0.9 % IV BOLUS
500.0000 mL | Freq: Once | INTRAVENOUS | Status: AC
  Administered 2022-04-11: 500 mL via INTRAVENOUS

## 2022-04-11 MED ORDER — BISACODYL 10 MG RE SUPP
10.0000 mg | Freq: Once | RECTAL | Status: AC
Start: 2022-04-11 — End: 2022-04-11
  Administered 2022-04-11: 10 mg via RECTAL
  Filled 2022-04-11: qty 1

## 2022-04-11 NOTE — Progress Notes (Signed)
[facility] for [facility]ious Disease  Date of Admission:  04/03/2022    Principal Problem:   Severe sepsis [facility]) Active Problems:   Malignant neoplasm of rectosigmoid junction (HCC)   Acute metabolic encephalopathy   Aortic mural thrombus (HCC)   Thrombocytopenia (HCC)   Hepatitis C   Normocytic anemia   Myocardial injury   Acute urinary retention   Malnutrition of moderate degree   E coli and citrobacter bacteremia   Pelvic abscess in female   Hepatic cirrhosis (HCC)   Hypokalemia   Hypomagnesemia   Coagulopathy (HCC)          Assessment: 61 year old female admitted with: #Colitis and secondary Citrobacter E. coli bacteremia #Pelvic abscess status post drain placement on 8/14 - Complicated with CT evidence of gas and fluid in the left pelvis consistent with perforation of the rectosigmoid colon with contained abscess formation peritonitis.  Perforation likely related to colitis, recent chemotherapy,bevacizumab. -Underwent CT-guided drainage of left pelvic abscess on 8/23.  Approximately 60 cc of frank feculent/purulent material was aspirated. #Metastatic colon cancer. - Currently on chemotherapy with last dose approximately 1 week prior to admission. - Admission by Dr. Olin Hauser who was recommended holding chemotherapy until abscess has cleared. #Urinary retention on left lateral ureteronephrosis. - Likely related to obstruction of left pelvic abscess.  Foley in place #Sepsis secondary to bacteremia   Recommendations: -Continue cefepime and metronidazole - Anticipate 4 weeks of antibiotics and drain placement -Follow drain cultures from 8/23  #Chronic HCV infection -VL 147,000 ib 8/23 -Patient reports that she has known of her hep C history for about 10 years.  Given her mental status, carcinoma she would like to hold off treatment for hepatitis C.  She notes that she contracted hepatitis C from her husband.  Denies any history of IVDA,  tattoos.  Microbiology:   Antibiotics: Cefepime and metro Cultures: Blood 8/22 p    SUBJECTIVE: Laying in bed. No new complaints.  Interval : Afebrile overnight. Wbc 16.8k    Review of Systems: ROS   Scheduled Meds:  Chlorhexidine Gluconate Cloth  6 each Topical Q0600   feeding supplement  1 Container Oral BID BM   feeding supplement  237 mL Oral BID BM   multivitamin with minerals  1 tablet Oral Daily   sodium chloride flush  5 mL Intracatheter Q8H   Continuous Infusions:  0.9 % NaCl with KCl 20 mEq / L 75 mL/hr at 04/11/22 0929   ceFEPime (MAXIPIME) IV 2 g (04/11/22 0945)   metronidazole 500 mg (04/11/22 0943)   PRN Meds:.acetaminophen **OR** acetaminophen, mouth rinse, oxyCODONE, prochlorperazine Allergies  Allergen Reactions   Codeine     unknown   Epinephrine (Anaphylaxis)     unknown   Orange Fruit [Citrus] Other (See Comments)    Blisters on face.   Streptogramins     unknown   Tomato Other (See Comments)    Blisters on face    OBJECTIVE: Vitals:   04/11/22 0548 04/11/22 0928 04/11/22 1125 04/11/22 1321  BP: 110/74 114/74 116/74 115/75  Pulse: (!) 110 (!) 116 (!) 120 (!) 123  Resp: '20 18  20  '$ Temp: 97.8 F (36.6 C) 98.4 F (36.9 C) 98.6 F (37 C) 98 F (36.7 C)  TempSrc: Oral  Oral Oral  SpO2: 97% 97%  97%  Weight:      Height:       Body mass index is 23.73 kg/m.  Physical Exam Constitutional:  Appearance: Normal appearance.  HENT:     Head: Normocephalic and atraumatic.     Right Ear: Tympanic membrane normal.     Left Ear: Tympanic membrane normal.     Nose: Nose normal.     Mouth/Throat:     Mouth: Mucous membranes are moist.  Eyes:     Extraocular Movements: Extraocular movements intact.     Conjunctiva/sclera: Conjunctivae normal.     Pupils: Pupils are equal, round, and reactive to light.  Cardiovascular:     Rate and Rhythm: Normal rate and regular rhythm.     Heart sounds: No murmur heard.    No friction rub. No  gallop.  Pulmonary:     Effort: Pulmonary effort is normal.     Breath sounds: Normal breath sounds.  Abdominal:     General: Abdomen is flat.     Palpations: Abdomen is soft.  Musculoskeletal:        General: Normal range of motion.  Skin:    General: Skin is warm and dry.     Comments: Left sided flank drian  Neurological:     General: No focal deficit present.     Mental Status: She is alert and oriented to person, place, and time.  Psychiatric:        Mood and Affect: Mood normal.       Lab Results Lab Results  Component Value Date   WBC 16.8 (H) 04/11/2022   HGB 9.1 (L) 04/11/2022   HCT 26.7 (L) 04/11/2022   MCV 100.0 04/11/2022   PLT 97 (L) 04/11/2022    Lab Results  Component Value Date   CREATININE 0.45 04/11/2022   BUN 10 04/11/2022   NA 137 04/11/2022   K 3.6 04/11/2022   CL 115 (H) 04/11/2022   CO2 19 (L) 04/11/2022    Lab Results  Component Value Date   ALT 17 04/11/2022   AST 20 04/11/2022   ALKPHOS 84 04/11/2022   BILITOT 1.7 (H) 04/11/2022        Laurice Record, [facility] 04/11/2022, 2:21 PM

## 2022-04-11 NOTE — Plan of Care (Signed)
  Problem: Education: Goal: Knowledge of General Education information will improve Description: Including pain rating scale, medication(s)/side effects and non-pharmacologic comfort measures Outcome: Progressing   Problem: Coping: Goal: Level of anxiety will decrease Outcome: Progressing   Problem: Pain Managment: Goal: General experience of comfort will improve Outcome: Not Progressing   Problem: Safety: Goal: Ability to remain free from injury will improve Outcome: Progressing   Problem: Skin Integrity: Goal: Risk for impaired skin integrity will decrease Outcome: Progressing   Problem: Respiratory: Goal: Ability to maintain adequate ventilation will improve Outcome: Progressing

## 2022-04-11 NOTE — Progress Notes (Signed)
The patient has had sips of clears today. She has tolerated well. She denies N/V, or abdominal pain.

## 2022-04-11 NOTE — Progress Notes (Addendum)
   04/10/22 2208  Assess: MEWS Score  Temp 99.1 F (37.3 C)  BP 130/82  MAP (mmHg) 96  Pulse Rate (!) 136  Resp 20  Level of Consciousness Alert  SpO2 96 %  O2 Device Room Air  Patient Activity (if Appropriate) In bed  Assess: MEWS Score  MEWS Temp 0  MEWS Systolic 0  MEWS Pulse 3  MEWS RR 0  MEWS LOC 0  MEWS Score 3  MEWS Score Color Yellow  Assess: if the MEWS score is Yellow or Red  Were vital signs taken at a resting state? Yes  Focused Assessment No change from prior assessment  Does the patient meet 2 or more of the SIRS criteria? Yes  Does the patient have a confirmed or suspected source of infection? Yes  Provider and Rapid Response Notified? Yes  MEWS guidelines implemented *See Row Information* No, previously yellow, continue vital signs every 4 hours  Treat  MEWS Interventions Administered prn meds/treatments;Escalated (See documentation below)  Pain Scale 0-10  Pain Score 8  Pain Type Acute pain  Pain Location Abdomen  Pain Orientation Lower  Pain Descriptors / Indicators Aching;Discomfort  Pain Frequency Constant  Pain Onset On-going  Patients Stated Pain Goal 5  Pain Intervention(s) Medication (See eMAR);MD notified (Comment) (will give pain medication when able to give)  Multiple Pain Sites No  Escalate  MEWS: Escalate Yellow: discuss with charge nurse/RN and consider discussing with provider and RRT  Notify: Charge Nurse/RN  Name of Charge Nurse/RN Notified Karrie Doffing, RN  Date Charge Nurse/RN Notified 04/10/22  Time Charge Nurse/RN Notified 2220  Notify: Provider  Provider Name/Title T. Opyd, MD  Date Provider Notified 04/10/22  Time Provider Notified 2220  Method of Notification Page  Notification Reason Other (Comment) (patient being in pain and elevated HR)  Provider response See new orders  Date of Provider Response 04/10/22  Time of Provider Response 2223  Notify: Rapid Response  Name of Rapid Response RN Notified Russella Dar., RN   Date Rapid Response Notified 04/10/22  Time Rapid Response Notified 2212  Document  Progress note created (see row info) Yes  Assess: SIRS CRITERIA  SIRS Temperature  0  SIRS Pulse 1  SIRS Respirations  0  SIRS WBC 1  SIRS Score Sum  2

## 2022-04-11 NOTE — Progress Notes (Signed)
Patient ID: Angela Mcpherson, female   DOB: 04-13-1961, 61 y.o.   MRN: [id number]    Referring Physician(s): Gross,S  Supervising Physician: Arne Cleveland  Patient Status:  [facility] - In-pt  Chief Complaint:  Pelvic abscess s/p L transgluteal drain placement 8/23  Subjective: Pt sleeping but arousable; denies N/V; still has some abd discomfort; drain output minimal today   Allergies: Codeine, Epinephrine (anaphylaxis), Orange fruit [citrus], Streptogramins, and Tomato  Medications: Prior to Admission medications   Medication Sig Start Date End Date Taking? Authorizing Provider  acetaminophen (TYLENOL) 325 MG tablet Take 650 mg by mouth every 6 (six) hours as needed for moderate pain.   Yes [provider]  capecitabine (XELODA) 500 MG tablet TAKE 2 TABLETS EVERY 12 HOURS  FOR 14 DAYS ON THEN 7 DAYS OFF, TAKE WITH MEALS Patient taking differently: Take 1,000 mg/m2 by mouth 2 (two) times daily after a meal. 03/24/22  Yes Truitt Merle, MD  cholecalciferol (VITAMIN D3) 25 MCG (1000 UNIT) tablet Take 1,000 Units by mouth daily.   Yes [provider]  ELIQUIS 5 MG TABS tablet TAKE 1 TABLET BY MOUTH TWICE A DAY Patient taking differently: Take 5 mg by mouth 2 (two) times daily. 03/24/22  Yes Truitt Merle, MD  KLOR-CON M20 20 MEQ tablet TAKE 1 TABLET BY MOUTH TWICE A DAY Patient taking differently: Take 20 mEq by mouth daily. 12/13/21  Yes Truitt Merle, MD  loperamide (IMODIUM) 2 MG capsule Take 1 capsule (2 mg total) by mouth as needed for diarrhea or loose stools. Patient taking differently: Take 2 mg by mouth daily as needed for diarrhea or loose stools. 05/27/21  Yes Nicole Kindred A, DO  ondansetron (ZOFRAN) 8 MG tablet Take 1 tablet (8 mg total) by mouth 2 (two) times daily as needed for refractory nausea / vomiting. Start on day 3 after chemotherapy. Patient taking differently: Take 8 mg by mouth 2 (two) times daily as needed for refractory nausea / vomiting. 03/06/21  Yes Truitt Merle,  MD  diphenoxylate-atropine (LOMOTIL) 2.5-0.025 MG tablet Take 2 tablets by mouth 4 (four) times daily as needed for diarrhea or loose stools. Patient not taking: Reported on 04/03/2022 01/06/22   Truitt Merle, MD     Vital Signs: BP 115/75 (BP Location: Right Arm)   Pulse (!) 123   Temp 98 F (36.7 C) (Oral)   Resp 20   Ht '5\' 4"'$  (1.626 m)   Wt 138 lb 3.7 oz (62.7 kg)   SpO2 97%   BMI 23.73 kg/m   Physical Exam drowsy but arouasble; left TG drain intact, insertion site ok, output 10 cc turbid, beige fluid  Imaging: DG Abd Portable 1V  Result Date: 04/10/2022 CLINICAL DATA:  Abdominal pain EXAM: PORTABLE ABDOMEN - 1 VIEW COMPARISON:  04/08/2022 FINDINGS: Increased small bowel distension compared to prior, bowel loops dilated up to 6.9 cm. No gross intramural air. IMPRESSION: Worsening bowel distension compared to prior, ileus versus distal bowel obstruction. Electronically Signed   By: Donavan Foil M.D.   On: 04/10/2022 23:05   CT GUIDED VISCERAL FLUID DRAIN BY PERC CATH  Result Date: 04/09/2022 INDICATION: 61 year old female with a history of pelvic abscess referred for drainage EXAM: CT GUIDED DRAINAGE OF PELVIC ABSCESS MEDICATIONS: The patient is currently admitted to the hospital and receiving intravenous antibiotics. The antibiotics were administered within an appropriate time frame prior to the initiation of the procedure. ANESTHESIA/SEDATION: 2.0 mg IV Versed 150 mcg IV Fentanyl Moderate Sedation Time:  22 minutes The  patient was continuously monitored during the procedure by the interventional radiology nurse under my direct supervision. COMPLICATIONS: None TECHNIQUE: Informed written consent was obtained from the patient after a thorough discussion of the procedural risks, benefits and alternatives. All questions were addressed. Maximal Sterile Barrier Technique was utilized including caps, mask, sterile gowns, sterile gloves, sterile drape, hand hygiene and skin antiseptic. A timeout  was performed prior to the initiation of the procedure. PROCEDURE: Patient was position prone on the CT gantry table. Scout CT was acquired for planning purposes. The left gluteal region was prepped with Chlorhexidine in a sterile fashion, and a sterile drape was applied covering the operative field. A sterile gown and sterile gloves were used for the procedure. Local anesthesia was provided with 1% Lidocaine. Using CT guidance, a trocar needle was passed in a transgluteal approach, avoiding the major vasculature, and targeting the fluid and gas collection within the left pelvis. Once we confirmed needle tip position, stylet was removed and a wire was passed into the collection. Modified Seldinger technique was used to place a 10 French pigtail catheter. Aspiration approximately 60 cc of frankly feculent/purulent material was performed. Sample sent for culture. The drain was sutured in position and attached to bulb suction. Final CT was acquired. Patient tolerated the procedure well and remained hemodynamically stable throughout. No complications were encountered and no significant blood loss. FINDINGS: Scout CT again demonstrates fluid and gas collection within the left pelvis. Left transgluteal approach was performed avoiding the major vasculature within the left hemipelvis. Pathologic lymphadenopathy is again noted. Final CT demonstrates decompression of the fluid and gas collection of the left pelvis. IMPRESSION: Status post CT-guided left transgluteal drain for left pelvic abscess. Signed, Dulcy Fanny. Nadene Rubins, RPVI V[facility] Inter[facility] Radiology Special[facility] Radiology Electronically Signed   By: Corrie Mckusick D.O.   On: 04/09/2022 16:53   CT ABDOMEN PELVIS W CONTRAST  Result Date: 04/08/2022 CLINICAL DATA:  Sepsis, persistent fever, abdominal pain. * Tracking Code: BO * EXAM: CT ABDOMEN AND PELVIS WITH CONTRAST TECHNIQUE: Multidetector CT imaging of the abdomen and pelvis was  performed using the standard protocol following bolus administration of intravenous contrast. RADIATION DOSE REDUCTION: This exam was performed according to the departmental dose-optimization program which includes automated exposure control, adjustment of the mA and/or kV according to patient size and/or use of iterative reconstruction technique. CONTRAST:  111m OMNIPAQUE IOHEXOL 300 MG/ML  SOLN COMPARISON:  None Available. FINDINGS: Lower chest: Bibasilar atelectasis small effusions. Hepatobiliary: No focal hepatic lesion. New large volume of fluid surrounds the margin of the liver extends along the RIGHT pericolic gutter. Gallbladder distended without evidence inflammation. No biliary duct dilatation. Portal veins are patent. Pancreas: Pancreas is normal. No ductal dilatation. No pancreatic inflammation. Spleen: Normal spleen Adrenals/urinary tract: Adrenal glands normal. There is mild hydronephrosis hydroureter on the LEFT. Obstructive LEFT ureter likely related to abscess formation in the LEFT pelvis. Stomach/Bowel: Stomach proximal small bowel normal. Contrast flows proximal half way through the small bowel. There is multiple air-fluid levels within the small bowel without significant dilatation. The ascending and transverse colon gas-filled relatively normal. No evidence of colitis seen on comparison exam. Within the LEFT lower quadrant just superior to the operator space is a contained fluid collection which has fluid and gas measuring 4.5 x 6.7 cm (image 82/2). Favor contained perforation of the bowel at rectosigmoid level small wiht extraluminal gas noted on image 79/2. No significant intraperitoneal free air. There is a large volume intraperitoneal free fluid.  Margin of the free fluid is enhancing in the LEFT pericolic gutter (image 81/4) consistent peritonitis. Again demonstrated a mass at the rectosigmoid junction. Vascular/Lymphatic: Abdominal aorta is normal caliber with intimal calcification. Lymph  node deep to the operator space again noted on the LEFT measuring 3.1 x 2.6 cm Reproductive: Grossly unremarkable Other: None Musculoskeletal: No aggressive osseous lesion. IMPRESSION: 1. Gas and fluid collection in the deep LEFT pelvis most consistent with perforation of the rectosigmoid colon with contained abscess formation. 2. New large volume intraperitoneal free fluid. Evidence of peritonitis associated the free fluid. 3. Small bowel ileus. 4. No large volume intraperitoneal free air. 5. New mild hydro nephrosis and hydroureter on the LEFT presumed related to obstruction of LEFT ureter by the LEFT pelvic abscess. [facility] on 04/08/2022  at13:59. Electronically Signed   By: Suzy Bouchard M.D.   On: 04/08/2022 14:01   DG Abd 1 View  Result Date: 04/08/2022 CLINICAL DATA:  61 year old female history of abdominal distension. EXAM: ABDOMEN - 1 VIEW COMPARISON:  No prior abdominal radiograph. CT of the abdomen and pelvis 04/03/2022. FINDINGS: Diffuse gaseous distension is noted throughout the small bowel and colon. Small bowel loops measure up to 4.1 cm in the central abdomen. Rectal gas is noted. No definite pneumoperitoneum confidently identified. IMPRESSION: Abnormal bowel-gas pattern, most suggestive of an ileus, as above. Electronically Signed   By: Vinnie Langton M.D.   On: 04/08/2022 07:07    Labs:  CBC: Recent Labs    04/08/22 0419 04/09/22 0423 04/10/22 0410 04/11/22 0426  WBC 16.8* 21.3* 16.4* 16.8*  HGB 11.7* 9.4* 8.9* 9.1*  HCT 34.1* 27.3* 25.4* 26.7*  PLT 92* 84* 85* 97*    COAGS: Recent Labs    05/18/21 2044 05/19/21 0618 04/08/22 1517 04/09/22 0423 04/10/22 0410 04/11/22 0426  INR 1.3*   < > 2.8* 2.2* 2.2* 2.1*  APTT 24  --   --   --   --   --    < > = values in this interval not displayed.    BMP: Recent Labs    04/08/22 0419 04/09/22 0423 04/09/22 1252 04/10/22 0410 04/11/22 0426  NA 133* 135  --  136 137  K 3.6 2.6* 3.2* 2.8*  3.6  CL 107 107  --  115* 115*  CO2 20* 20*  --  18* 19*  GLUCOSE 96 111*  --  120* 100*  BUN 13 11  --  10 10  CALCIUM 7.9* 7.7*  --  7.7* 8.2*  CREATININE 0.56 0.54  --  0.49 0.45  GFRNONAA >60 >60  --  >60 >60    LIVER FUNCTION TESTS: Recent Labs    04/08/22 0419 04/09/22 0423 04/10/22 0410 04/11/22 0426  BILITOT 2.4* 2.1* 1.7* 1.7*  AST 54* 32 23 20  ALT 33 '25 20 17  '$ ALKPHOS 95 82 73 84  PROT 4.7* 4.7* 4.4* 4.7*  ALBUMIN 2.0* 1.9* 1.7* 1.7*    Assessment and Plan: Pt with hx of metastatic colorectal cancer, recent admission for diarrhea and colitis along with new pelvic abscess concerning for contained perforation at cancer; status post pelvic abscess drain placement on 8/23; afebrile, remains tacky, WBC 16.8 up from 16.4, hemoglobin stable, creatinine normal, cultures pending  Drain Location: left transgluteal/pelvic Size: Fr size: 10 Fr Date of placement: 04/09/22  Currently to: Drain collection device: suction bulb 24 hour output:  Output by Drain (mL) 04/09/22 0701 - 04/09/22 1900 04/09/22 1901 - 04/10/22 0700 04/10/22 0701 -  04/10/22 1900 04/10/22 1901 - 04/11/22 0700 04/11/22 0701 - 04/11/22 1616  Open Drain 1 Left Buttock 10 Fr.  '25 10 20 10      '$ Current examination: Flushes/aspirates easily.  Insertion site unremarkable. Suture  place. Dressed appropriately.   Plan: Continue TID flushes with 5 cc NS. Record output Q shift. Dressing changes QD or PRN if soiled.  Call IR APP or on call IR MD if difficulty flushing or sudden change in drain output.  Repeat imaging/possible drain injection once output < 10 mL/QD (excluding flush material). Consideration for drain removal if output is < 10 mL/QD (excluding flush material), pending discussion with the providing surgical service.  Discharge planning: Please contact IR APP or on call IR MD prior to patient d/c to ensure appropriate follow up plans are in place. Typically patient will follow up with IR clinic  10-14 days post d/c for repeat imaging/possible drain injection. IR scheduler will contact patient with date/time of appointment. Patient will need to flush drain QD with 5 cc NS, record output QD, dressing changes every 2-3 days or earlier if soiled.   IR will continue to follow - please call with questions or concerns.     Electronically Signed: D. Rowe Robert, PA-C 04/11/2022, 4:06 PM   I spent a total of 15 Minutes at the the patient's bedside AND on the patient's hospital floor or unit, greater than 50% of which was counseling/coordinating care for pelvic abscess drain

## 2022-04-11 NOTE — Progress Notes (Signed)
Progress Note     Subjective: Pain improving, but more distended by report overnight so made NPO.  Patient denies any nausea or vomiting and is wanting liquids back.  No flatus and no bm for several days   Objective: Vital signs in last 24 hours: Temp:  [97.8 F (36.6 C)-99.1 F (37.3 C)] 98.4 F (36.9 C) (08/25 0928) Pulse Rate:  [110-136] 116 (08/25 0928) Resp:  [17-20] 18 (08/25 0928) BP: (109-130)/(74-82) 114/74 (08/25 0928) SpO2:  [94 %-98 %] 97 % (08/25 0928) Weight:  [62.7 kg] 62.7 kg (08/24 1247) Last BM Date : 04/10/22  Intake/Output from previous day: 08/24 0701 - 08/25 0700 In: 1115 [P.O.:660; I.V.:5; IV Piggyback:450] Out: 730 [Urine:700; Drains:30] Intake/Output this shift: No intake/output data recorded.  PE: General:  NAD Lungs: Respiratory effort nonlabored Abd: soft, but distended (doesn't seem any more distended today than yesterday), drain noted on left side with minimal slightly thin milky output, 30cc/yesterday   Lab Results:  Recent Labs    04/10/22 0410 04/11/22 0426  WBC 16.4* 16.8*  HGB 8.9* 9.1*  HCT 25.4* 26.7*  PLT 85* 97*   BMET Recent Labs    04/10/22 0410 04/11/22 0426  NA 136 137  K 2.8* 3.6  CL 115* 115*  CO2 18* 19*  GLUCOSE 120* 100*  BUN 10 10  CREATININE 0.49 0.45  CALCIUM 7.7* 8.2*   PT/INR Recent Labs    04/10/22 0410 04/11/22 0426  LABPROT 24.1* 23.1*  INR 2.2* 2.1*   CMP     Component Value Date/Time   NA 137 04/11/2022 0426   K 3.6 04/11/2022 0426   CL 115 (H) 04/11/2022 0426   CO2 19 (L) 04/11/2022 0426   GLUCOSE 100 (H) 04/11/2022 0426   BUN 10 04/11/2022 0426   CREATININE 0.45 04/11/2022 0426   CREATININE 0.55 03/24/2022 1029   CALCIUM 8.2 (L) 04/11/2022 0426   PROT 4.7 (L) 04/11/2022 0426   ALBUMIN 1.7 (L) 04/11/2022 0426   AST 20 04/11/2022 0426   AST 28 03/24/2022 1029   ALT 17 04/11/2022 0426   ALT 19 03/24/2022 1029   ALKPHOS 84 04/11/2022 0426   BILITOT 1.7 (H) 04/11/2022 0426    BILITOT 1.8 (H) 03/24/2022 1029   GFRNONAA >60 04/11/2022 0426   GFRNONAA >60 03/24/2022 1029   GFRAA  06/26/2010 0755    >60        The eGFR has been calculated using the MDRD equation. This calculation has not been validated in all clinical situations. eGFR's persistently <60 mL/min signify possible Chronic Kidney Disease.   Lipase     Component Value Date/Time   LIPASE 44 04/03/2022 1126       Studies/Results: DG Abd Portable 1V  Result Date: 04/10/2022 CLINICAL DATA:  Abdominal pain EXAM: PORTABLE ABDOMEN - 1 VIEW COMPARISON:  04/08/2022 FINDINGS: Increased small bowel distension compared to prior, bowel loops dilated up to 6.9 cm. No gross intramural air. IMPRESSION: Worsening bowel distension compared to prior, ileus versus distal bowel obstruction. Electronically Signed   By: Donavan Foil M.D.   On: 04/10/2022 23:05   CT GUIDED VISCERAL FLUID DRAIN BY PERC CATH  Result Date: 04/09/2022 INDICATION: 61 year old female with a history of pelvic abscess referred for drainage EXAM: CT GUIDED DRAINAGE OF PELVIC ABSCESS MEDICATIONS: The patient is currently admitted to the hospital and receiving intravenous antibiotics. The antibiotics were administered within an appropriate time frame prior to the initiation of the procedure. ANESTHESIA/SEDATION: 2.0 mg IV Versed 150  mcg IV Fentanyl Moderate Sedation Time:  22 minutes The patient was continuously monitored during the procedure by the interventional radiology nurse under my direct supervision. COMPLICATIONS: None TECHNIQUE: Informed written consent was obtained from the patient after a thorough discussion of the procedural risks, benefits and alternatives. All questions were addressed. Maximal Sterile Barrier Technique was utilized including caps, mask, sterile gowns, sterile gloves, sterile drape, hand hygiene and skin antiseptic. A timeout was performed prior to the initiation of the procedure. PROCEDURE: Patient was position prone on  the CT gantry table. Scout CT was acquired for planning purposes. The left gluteal region was prepped with Chlorhexidine in a sterile fashion, and a sterile drape was applied covering the operative field. A sterile gown and sterile gloves were used for the procedure. Local anesthesia was provided with 1% Lidocaine. Using CT guidance, a trocar needle was passed in a transgluteal approach, avoiding the major vasculature, and targeting the fluid and gas collection within the left pelvis. Once we confirmed needle tip position, stylet was removed and a wire was passed into the collection. Modified Seldinger technique was used to place a 10 French pigtail catheter. Aspiration approximately 60 cc of frankly feculent/purulent material was performed. Sample sent for culture. The drain was sutured in position and attached to bulb suction. Final CT was acquired. Patient tolerated the procedure well and remained hemodynamically stable throughout. No complications were encountered and no significant blood loss. FINDINGS: Scout CT again demonstrates fluid and gas collection within the left pelvis. Left transgluteal approach was performed avoiding the major vasculature within the left hemipelvis. Pathologic lymphadenopathy is again noted. Final CT demonstrates decompression of the fluid and gas collection of the left pelvis. IMPRESSION: Status post CT-guided left transgluteal drain for left pelvic abscess. Signed, Dulcy Fanny. Nadene Rubins, RPVI Vascular and Interventional Radiology Specialists [facility] Radiology Electronically Signed   By: Corrie Mckusick D.O.   On: 04/09/2022 16:53    Anti-infectives: Anti-infectives (From admission, onward)    Start     Dose/Rate Route Frequency Ordered Stop   04/08/22 2100  ceFEPIme (MAXIPIME) 2 g in sodium chloride 0.9 % 100 mL IVPB        2 g 200 mL/hr over 30 Minutes Intravenous Every 12 hours 04/08/22 1453     04/08/22 1600  metroNIDAZOLE (FLAGYL) IVPB 500 mg        500 mg 100  mL/hr over 60 Minutes Intravenous Every 12 hours 04/08/22 1453     04/08/22 1515  piperacillin-tazobactam (ZOSYN) IVPB 3.375 g  Status:  Discontinued        3.375 g 12.5 mL/hr over 240 Minutes Intravenous Every 8 hours 04/08/22 1428 04/08/22 1447   04/07/22 2000  ceFEPIme (MAXIPIME) 2 g in sodium chloride 0.9 % 100 mL IVPB  Status:  Discontinued        2 g 200 mL/hr over 30 Minutes Intravenous Every 12 hours 04/07/22 1600 04/08/22 1428   04/04/22 0900  cefTRIAXone (ROCEPHIN) 2 g in sodium chloride 0.9 % 100 mL IVPB  Status:  Discontinued        2 g 200 mL/hr over 30 Minutes Intravenous Every 24 hours 04/04/22 0257 04/07/22 1600   04/03/22 1700  ceFEPIme (MAXIPIME) 2 g in sodium chloride 0.9 % 100 mL IVPB  Status:  Discontinued        2 g 200 mL/hr over 30 Minutes Intravenous Every 8 hours 04/03/22 1634 04/04/22 0257   04/03/22 1500  metroNIDAZOLE (FLAGYL) IVPB 500 mg  Status:  Discontinued        500 mg 100 mL/hr over 60 Minutes Intravenous Every 12 hours 04/03/22 1446 04/04/22 0257   04/03/22 1045  ceFEPIme (MAXIPIME) 2 g in sodium chloride 0.9 % 100 mL IVPB        2 g 200 mL/hr over 30 Minutes Intravenous  Once 04/03/22 1031 04/03/22 1429   04/03/22 1045  vancomycin (VANCOCIN) IVPB 1000 mg/200 mL premix        1,000 mg 200 mL/hr over 60 Minutes Intravenous  Once 04/03/22 1031 04/03/22 1241        Assessment/Plan Metastatic colorectal cancer and chemotherapy, last dose 1 week ago.  Admission with diarrhea and colitis on antibiotics. New pelvic abscess concern for contained perforation at cancer.  - no indication for emergent surgical intervention - no peritonitis and not a good surgical candidate given malnourishment and recent chemotherapy.  -IR drain on 8/23 -cx gram - rods, gram + cocci  - WBC stable at 16K today, AF last 24 hrs. Continue abx - ID following - abd x-ray reviewed from overnight.  Her SB is definitely dilated, but not that much more so than on her CT scan several  days ago.  Given she is dilated, but with no nausea or vomiting, will keep NPO but allow sips of clear liquids from the floor to assure she does not develop N/V. -dulcolax suppository  FEN: NPO, x sips from floor/IVFs ID: [email or web address] VTE: eliquis on hold, INR 2.1  I reviewed Consultant ID notes, hospitalist notes, last 24 h vitals and pain scores, last 48 h intake and output, last 24 h labs and trends, and last 24 h imaging results.    LOS: 8 days   Angela Mcpherson, [facility] 04/11/2022, 9:53 AM Please see Amion for pager number during day hours 7:00am-4:30pm

## 2022-04-11 NOTE — Progress Notes (Signed)
Bladder scan (187 cc) completed at 6 hours post foley removal. Patient was encouraged to void. The pt was not able to void at this time. Will continue to encourage.

## 2022-04-11 NOTE — TOC Progression Note (Addendum)
Transition of Care [facility]) - Progression Note    Patient Details  Name: Taniesha Glanz MRN: [id number] Date of Birth: 1960-08-31  Transition of Care [facility]) CM/SW [place], RN Phone Number: 04/11/2022, 11:55 AM  Clinical Narrative:   This RNCM offered choice for HHPT and DME: RW. Patient and husband Antony Haste report patient has a rolling walker at home, declined RW. This RNCM advised PT has recc home PT services, patient and husband declined HHPT and will follow up with her PCP if she feels needs later. This RNCM encouraged husband to follow up with PCP within 3-7 days and discuss further.  Note: per chart review patient had previous [place] services with [facility].   No TOC needs at this time.    Expected Discharge Plan: [place] Barriers to Discharge: No Barriers Identified  Expected Discharge Plan and Services Expected Discharge Plan: [place] In-house Referral: NA Discharge Planning Services: CM Consult Post Acute Care Choice: Home Health, Durable Medical Equipment Living arrangements for the past 2 months: Single Family Home                 DME Arranged: N/A DME Agency: NA       HH Arranged: NA HH Agency: NA         Social Determinants of Health (SDOH) Interventions    Readmission Risk Interventions    04/11/2022   11:52 AM  Readmission Risk Prevention Plan  Transportation Screening Complete  PCP or Specialist Appt within 3-5 Days Complete  HRI or [place] Complete  Social Work Consult for [place] Planning/Counseling Complete  Palliative Care Screening Not Applicable  Medication Review Press photographer) Complete

## 2022-04-11 NOTE — Progress Notes (Addendum)
[facility]  Date of Admission:  04/03/2022    Principal Problem:   Severe sepsis [facility]) Active Problems:   Malignant neoplasm of rectosigmoid junction (HCC)   Acute metabolic encephalopathy   Aortic mural thrombus (HCC)   Thrombocytopenia (HCC)   Hepatitis C   Normocytic anemia   Myocardial injury   Acute urinary retention   Malnutrition of moderate degree   E coli and citrobacter bacteremia   Pelvic abscess in female   Hepatic cirrhosis (HCC)   Hypokalemia   Hypomagnesemia   Coagulopathy (HCC)          Assessment: 61 year old female admitted with: #Colitis and secondary Citrobacter E. coli bacteremia #Pelvic abscess status post drain placement on 4/00 - Complicated with CT evidence of gas and fluid in the left pelvis consistent with perforation of the rectosigmoid colon with contained abscess formation peritonitis.  Perforation likely related to colitis, recent chemotherapy,bevacizumab. -Underwent CT-guided drainage of left pelvic abscess on 8/23.  Approximately 60 cc of frank feculent/purulent material was aspirated. #Metastatic colon cancer. - Currently on chemotherapy with last dose approximately 1 week prior to admission. - Admission by Dr. Olin Hauser who was recommended holding chemotherapy until abscess has cleared. #Urinary retention on left lateral ureteronephrosis. - Likely related to obstruction of left pelvic abscess.  Foley in place #Sepsis secondary to bacteremia #HCV antibody positive  Recommendations: -Continue cefepime and metronidazole - Follow urine cultures - Anticipate 4 weeks of antibiotics and drain placement -Follow HCV viral. Review risks  Microbiology:   Antibiotics: Cefepime and metro Cultures: Blood 8/22 p    SUBJECTIVE:  Siting upright. Reports she feels well. Interval : Afebrile overnight. Wbc 16k  Review of Systems: ROS   Scheduled Meds:  Chlorhexidine Gluconate Cloth  6 each Topical Q0600    feeding supplement  1 Container Oral BID BM   feeding supplement  237 mL Oral BID BM   fentaNYL       multivitamin with minerals  1 tablet Oral Daily   sodium chloride flush  5 mL Intracatheter Q8H   Continuous Infusions:  ceFEPime (MAXIPIME) IV 2 g (04/10/22 2122)   metronidazole 500 mg (04/10/22 2211)   PRN Meds:.acetaminophen **OR** acetaminophen, fentaNYL, mouth rinse, oxyCODONE, prochlorperazine Allergies  Allergen Reactions   Codeine     unknown   Epinephrine (Anaphylaxis)     unknown   Orange Fruit [Citrus] Other (See Comments)    Blisters on face.   Streptogramins     unknown   Tomato Other (See Comments)    Blisters on face    OBJECTIVE: Vitals:   04/10/22 1647 04/10/22 2208 04/11/22 0155 04/11/22 0548  BP: 123/80 130/82 109/76 110/74  Pulse: (!) 110 (!) 136 (!) 121 (!) 110  Resp: '18 20 20 20  '$ Temp: 98.3 F (36.8 C) 99.1 F (37.3 C) 98.3 F (36.8 C) 97.8 F (36.6 C)  TempSrc: Oral Oral Oral Oral  SpO2: 96% 96% 94% 97%  Weight:      Height:       Body mass index is 23.73 kg/m.  Physical Exam Constitutional:      Appearance: Normal appearance.  HENT:     Head: Normocephalic and atraumatic.     Right Ear: Tympanic membrane normal.     Left Ear: Tympanic membrane normal.     Nose: Nose normal.     Mouth/Throat:     Mouth: Mucous membranes are moist.  Eyes:     Extraocular  Movements: Extraocular movements intact.     Conjunctiva/sclera: Conjunctivae normal.     Pupils: Pupils are equal, round, and reactive to light.  Cardiovascular:     Rate and Rhythm: Normal rate and regular rhythm.     Heart sounds: No murmur heard.    No friction rub. No gallop.  Pulmonary:     Effort: Pulmonary effort is normal.     Breath sounds: Normal breath sounds.  Abdominal:     General: Abdomen is flat.     Palpations: Abdomen is soft.  Musculoskeletal:        General: Normal range of motion.  Skin:    General: Skin is warm and dry.     Comments: Left sided  flank drian  Neurological:     General: No focal deficit present.     Mental Status: She is alert and oriented to person, place, and time.  Psychiatric:        Mood and Affect: Mood normal.       Lab Results Lab Results  Component Value Date   WBC 16.8 (H) 04/11/2022   HGB 9.1 (L) 04/11/2022   HCT 26.7 (L) 04/11/2022   MCV 100.0 04/11/2022   PLT 97 (L) 04/11/2022    Lab Results  Component Value Date   CREATININE 0.45 04/11/2022   BUN 10 04/11/2022   NA 137 04/11/2022   K 3.6 04/11/2022   CL 115 (H) 04/11/2022   CO2 19 (L) 04/11/2022    Lab Results  Component Value Date   ALT 17 04/11/2022   AST 20 04/11/2022   ALKPHOS 84 04/11/2022   BILITOT 1.7 (H) 04/11/2022        Laurice Record, [place] [facility] 04/11/2022, 6:18 AM

## 2022-04-11 NOTE — Progress Notes (Signed)
Progress Note   Patient: Angela Mcpherson [id number]:[id number] DOB: 08-28-1960 DOA: 04/03/2022     8 DOS: the patient was seen and examined on 04/11/2022 at 11:40AM      Brief hospital course: Angela Mcpherson is a 61 y.o. 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     8/17: Admitted on antibiotics for colitis 8/18: Blood cultures growing E coli 8/21: WBC rising, ID consulted 8/22: Still worsening, CT shows new left sided abscess, not present on admission; Gen Surg and IR consulted 8/23: Underwent perc drain placement 8/24: Increasing abdominal pain overnight, no flatus or BM yet, HRs up, Abd x-ray shows dilated SB     Assessment and Plan: * Severe sepsis E coli and citrobacter bacteremia Pelvic abscess in female s/p percutaneous drain Blood and urine cultures with GNRs, see prior notes.    Abscess aspirate culture pending   Has abdominal distension, no BM or flatus, worsening pain, tachycardia, WBC stable.  - Continue Cefepime, Flagyl - Consult ID, appreciate cares - Follow culture data  - D/c foley - Defer management of bowel function to Gen Surg  - Plan when cultures result from abscess drain are to try to convert to oral regimen for 4 weeks with outpatient f/u and repeat imaging     Coagulopathy Present on admission, INR 4.  Presumably due to liver disease.  Doubt DIC.  INR down to 2.1 but stable there - Hold apixaban - Follow INR    Hypokalemia K supplemented and resolved    Acute urinary retention Left hydronephrosis due to mass Now that drain is in, hopefully her urinary retention is resolved - Trial foley d/c   Aortic mural Thrombus - Hold Eliquis given coagulapathy - Consult Hematology, appreciate cares               Subjective: Increased pain overnight, distention of the abdomen, no flatus or bowel movements.  Mentation is good,  looking at her phone with her husband.  But in fairly significant discomfort.  No vomiting, no fever.  Abdominal pain is colicky.     Physical Exam: Vitals:   04/11/22 0548 04/11/22 0928 04/11/22 1125 04/11/22 1321  BP: 110/74 114/74 116/74 115/75  Pulse: (!) 110 (!) 116 (!) 120 (!) 123  Resp: '20 18  20  '$ Temp: 97.8 F (36.6 C) 98.4 F (36.9 C) 98.6 F (37 C) 98 F (36.7 C)  TempSrc: Oral  Oral Oral  SpO2: 97% 97%  97%  Weight:      Height:       Thin adult female, lying in bed, appears uncomfortable, skin wan Tachycardic, no murmurs, no significant peripheral edema Respiratory rate shallow but no rales or wheezes Abdomen tender and distended, some of this is ascites, but this actually feels more like bowel distention to be, no guarding noted throughout, distended Attention normal, affect blunted, judgment and insight appear normal, face symmetric, speech fluent, generalized weakness but symmetric strength        Data Reviewed: Potassium improved today, INR 2.1, sodium and creatinine normal Blood cell count 16, no change, platelets 90, no change, hemoglobin up to 9.1 Abdominal x-ray, shows distended bowel    Family Communication: Husband at the bedside    Disposition: Status is: Inpatient The patient was admitted with sepsis  She has been found to have gram-negative rod bacteremia from a pelvic abscess  She has had  a drain placed, but not yet resumption of bowel function  When her cultures mature, we can transition to oral antibiotics, and then when she is able to tolerate diet and has resumption of bowel function, will plan for discharge with several weeks of oral antibiotics and outpatient follow-up for repeat imaging and drain removal        Author: Edwin Dada, MD 04/11/2022 4:36 PM  For on call review [email or web address].

## 2022-04-12 ENCOUNTER — Inpatient Hospital Stay (HOSPITAL_COMMUNITY): Payer: 59

## 2022-04-12 DIAGNOSIS — K567 Ileus, unspecified: Secondary | ICD-10-CM

## 2022-04-12 DIAGNOSIS — R338 Other retention of urine: Secondary | ICD-10-CM | POA: Diagnosis not present

## 2022-04-12 DIAGNOSIS — G9341 Metabolic encephalopathy: Secondary | ICD-10-CM | POA: Diagnosis not present

## 2022-04-12 DIAGNOSIS — I7410 Embolism and thrombosis of unspecified parts of aorta: Secondary | ICD-10-CM | POA: Diagnosis not present

## 2022-04-12 DIAGNOSIS — A419 Sepsis, unspecified organism: Secondary | ICD-10-CM | POA: Diagnosis not present

## 2022-04-12 LAB — COMPREHENSIVE METABOLIC PANEL
ALT: 19 U/L (ref 0–44)
AST: 19 U/L (ref 15–41)
Albumin: 1.8 g/dL — ABNORMAL LOW (ref 3.5–5.0)
Alkaline Phosphatase: 89 U/L (ref 38–126)
Anion gap: 5 (ref 5–15)
BUN: 10 mg/dL (ref 6–20)
CO2: 22 mmol/L (ref 22–32)
Calcium: 8.6 mg/dL — ABNORMAL LOW (ref 8.9–10.3)
Chloride: 112 mmol/L — ABNORMAL HIGH (ref 98–111)
Creatinine, Ser: 0.4 mg/dL — ABNORMAL LOW (ref 0.44–1.00)
GFR, Estimated: >60 mL/min (ref >60)
Glucose, Bld: 105 mg/dL — ABNORMAL HIGH (ref 70–99)
Potassium: 3 mmol/L — ABNORMAL LOW (ref 3.5–5.1)
Sodium: 139 mmol/L (ref 135–145)
Total Bilirubin: 2.3 mg/dL — ABNORMAL HIGH (ref 0.3–1.2)
Total Protein: 5.3 g/dL — ABNORMAL LOW (ref 6.5–8.1)

## 2022-04-12 LAB — CBC
HCT: 28.6 % — ABNORMAL LOW (ref 36.0–46.0)
Hemoglobin: 9.9 g/dL — ABNORMAL LOW (ref 12.0–15.0)
MCH: 34 pg (ref 26.0–34.0)
MCHC: 34.6 g/dL (ref 30.0–36.0)
MCV: 98.3 fL (ref 80.0–100.0)
Platelets: 110 10*3/uL — ABNORMAL LOW (ref 150–400)
RBC: 2.91 MIL/uL — ABNORMAL LOW (ref 3.87–5.11)
RDW: 24.5 % — ABNORMAL HIGH (ref 11.5–15.5)
WBC: 15.4 10*3/uL — ABNORMAL HIGH (ref 4.0–10.5)
nRBC: 0 % (ref 0.0–0.2)

## 2022-04-12 LAB — PROTIME-INR
INR: 2.1 — ABNORMAL HIGH (ref 0.8–1.2)
Prothrombin Time: 22.9 seconds — ABNORMAL HIGH (ref 11.4–15.2)

## 2022-04-12 MED ORDER — POTASSIUM CHLORIDE 10 MEQ/100ML IV SOLN: 10.0000 meq | INTRAVENOUS | Status: DC

## 2022-04-12 MED ORDER — LORAZEPAM 2 MG/ML IJ SOLN
1.0000 mg | Freq: Once | INTRAMUSCULAR | Status: AC
  Administered 2022-04-12: 1 mg via INTRAVENOUS
  Filled 2022-04-12: qty 1

## 2022-04-12 MED ORDER — POTASSIUM CHLORIDE 10 MEQ/100ML IV SOLN
10.0000 meq | INTRAVENOUS | Status: AC
  Administered 2022-04-12 (×4): 10 meq via INTRAVENOUS
  Filled 2022-04-12 (×4): qty 100

## 2022-04-12 MED ORDER — HYDROMORPHONE HCL 1 MG/ML IJ SOLN
0.5000 mg | INTRAMUSCULAR | Status: DC | PRN
  Administered 2022-04-12 – 2022-04-20 (×13): 0.5 mg via INTRAVENOUS
  Filled 2022-04-12 (×14): qty 0.5

## 2022-04-12 NOTE — Progress Notes (Signed)
Subjective/Chief Complaint: No real complaints, no real flatus or bm   Objective: Vital signs in last 24 hours: Temp:  [97.9 F (36.6 C)-98.6 F (37 C)] 98.1 F (36.7 C) (08/26 0500) Pulse Rate:  [58-123] 58 (08/26 0500) Resp:  [17-20] 18 (08/26 0500) BP: (108-116)/(74-79) 108/77 (08/26 0500) SpO2:  [92 %-100 %] 92 % (08/26 0500) Last BM Date : 04/11/22  Intake/Output from previous day: 08/25 0701 - 08/26 0700 In: 2339.9 [P.O.:200; I.V.:1239.9; IV Piggyback:900] Out: 625 [Urine:600; Drains:25] Intake/Output this shift: No intake/output data recorded.  Abd distended, drain with thin output, nontender  Lab Results:  Recent Labs    04/11/22 0426 04/12/22 0511  WBC 16.8* 15.4*  HGB 9.1* 9.9*  HCT 26.7* 28.6*  PLT 97* 110*   BMET Recent Labs    04/11/22 0426 04/12/22 0511  NA 137 139  K 3.6 3.0*  CL 115* 112*  CO2 19* 22  GLUCOSE 100* 105*  BUN 10 10  CREATININE 0.45 0.40*  CALCIUM 8.2* 8.6*   PT/INR Recent Labs    04/11/22 0426 04/12/22 0511  LABPROT 23.1* 22.9*  INR 2.1* 2.1*   ABG No results for input(s): "PHART", "HCO3" in the last 72 hours.  Invalid input(s): "PCO2", "PO2"  Studies/Results: DG Abd Portable 1V  Result Date: 04/10/2022 CLINICAL DATA:  Abdominal pain EXAM: PORTABLE ABDOMEN - 1 VIEW COMPARISON:  04/08/2022 FINDINGS: Increased small bowel distension compared to prior, bowel loops dilated up to 6.9 cm. No gross intramural air. IMPRESSION: Worsening bowel distension compared to prior, ileus versus distal bowel obstruction. Electronically Signed   By: Donavan Foil M.D.   On: 04/10/2022 23:05    Anti-infectives: Anti-infectives (From admission, onward)    Start     Dose/Rate Route Frequency Ordered Stop   04/08/22 2100  ceFEPIme (MAXIPIME) 2 g in sodium chloride 0.9 % 100 mL IVPB        2 g 200 mL/hr over 30 Minutes Intravenous Every 12 hours 04/08/22 1453     04/08/22 1600  metroNIDAZOLE (FLAGYL) IVPB 500 mg        500 mg 100  mL/hr over 60 Minutes Intravenous Every 12 hours 04/08/22 1453     04/08/22 1515  piperacillin-tazobactam (ZOSYN) IVPB 3.375 g  Status:  Discontinued        3.375 g 12.5 mL/hr over 240 Minutes Intravenous Every 8 hours 04/08/22 1428 04/08/22 1447   04/07/22 2000  ceFEPIme (MAXIPIME) 2 g in sodium chloride 0.9 % 100 mL IVPB  Status:  Discontinued        2 g 200 mL/hr over 30 Minutes Intravenous Every 12 hours 04/07/22 1600 04/08/22 1428   04/04/22 0900  cefTRIAXone (ROCEPHIN) 2 g in sodium chloride 0.9 % 100 mL IVPB  Status:  Discontinued        2 g 200 mL/hr over 30 Minutes Intravenous Every 24 hours 04/04/22 0257 04/07/22 1600   04/03/22 1700  ceFEPIme (MAXIPIME) 2 g in sodium chloride 0.9 % 100 mL IVPB  Status:  Discontinued        2 g 200 mL/hr over 30 Minutes Intravenous Every 8 hours 04/03/22 1634 04/04/22 0257   04/03/22 1500  metroNIDAZOLE (FLAGYL) IVPB 500 mg  Status:  Discontinued        500 mg 100 mL/hr over 60 Minutes Intravenous Every 12 hours 04/03/22 1446 04/04/22 0257   04/03/22 1045  ceFEPIme (MAXIPIME) 2 g in sodium chloride 0.9 % 100 mL IVPB  2 g 200 mL/hr over 30 Minutes Intravenous  Once 04/03/22 1031 04/03/22 1429   04/03/22 1045  vancomycin (VANCOCIN) IVPB 1000 mg/200 mL premix        1,000 mg 200 mL/hr over 60 Minutes Intravenous  Once 04/03/22 1031 04/03/22 1241       Assessment/Plan: Metastatic colorectal cancer and chemotherapy, last dose about 1 week ago.  pelvic abscess concern for contained perforation at cancer.  - no indication for emergent surgical intervention - no peritonitis and not a good surgical candidate given malnourishment and recent chemotherapy.  -IR drain on 8/23, at some point will need f/u ct scan -cx gram - rods, gram + cocci  - WBC 15.4 today, AF last 24 hrs. Continue abx - ID following - will check another xray today.  She may need ng drainage.  - situation difficult with stage IV colorectal cancer and her medical problems. I  think consideration of TPN is reasonable and will leave decision to primary. I also think a GOC discussion for her is reasonable. I don't think she will come to surgery and if does need it unlikely to be helpful  doubtful she will get any more chemotherapy anytime soon also with perforated cancer   FEN: NPO, x sips from floor/IVFs ID: cefepime/flagyl VTE: eliquis on hold, INR 2.1   I reviewed hospitalist notes, last 24 h vitals and pain scores, last 48 h intake and output, last 24 h labs and trends, and last 24 h imaging results.  This care required straight-forward level of medical decision making.   Rolm Bookbinder 04/12/2022

## 2022-04-12 NOTE — Progress Notes (Signed)
  Progress Note   Patient: Angela Mcpherson [id number] DOB: May 12, 1961 DOA: 04/03/2022     9 DOS: the patient was seen and examined on 04/12/2022 at 11:40AM      Brief hospital course: Angela Mcpherson is a 61 y.o. 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     8/17: Admitted on antibiotics for colitis 8/18: Blood cultures growing E coli 8/21: WBC rising, ID consulted 8/22: Still worsening, CT shows new left sided abscess, not present on admission; Gen Surg and IR consulted 8/23: Underwent perc drain placement 8/24: Increasing abdominal pain overnight, no flatus or BM yet, HRs up, Abd x-ray shows dilated SB 8/26: SB more dilated, NG placed     Assessment and Plan: * Severe sepsis E coli and citrobacter bacteremia Pelvic abscess in female s/p percutaneous drain Blood and urine cultures with GNRs, abscess aspirate with E coli and prevotella, report still pending   - Continue Cefepime, Flagyl - Consult ID, appreciate cares - Follow culture data   Ileus X-ray today with significantly more dilation.  Still in pain, still no flatus or BM. - Place NG to LIS -S tart IV fluids   Delirium The patient had some delirium and acute metabolic encephalopathy related to hospital stay, this appears to be getting worse again due to her ileus. -Recommend standard delirium precautions  Coagulopathy Due to liver disease.  Confers worse prognosis.  INR no change - Hold apixaban - Follow INR    Hypokalemia - SuppK again    Acute urinary retention Left hydronephrosis due to mass Foley removed and voiding okay   Aortic mural Thrombus - Contineu to hold Eliquis given coagulapathy - Consult Hematology, appreciate cares               Subjective: No change to colicky abdominal pain, bloating seems somewhat worse to me, patient seems somewhat confused.  She had no  fever.  She seems to be voiding okay.     Physical Exam: Vitals:   04/11/22 2130 04/12/22 0100 04/12/22 0500 04/12/22 1320  BP: 114/79  108/77 119/79  Pulse: (!) 116 (!) 107 (!) 58 (!) 119  Resp: '18 17 18 18  '$ Temp: 97.9 F (36.6 C)  98.1 F (36.7 C) 99 F (37.2 C)  TempSrc: Oral  Oral Oral  SpO2: 100%  92% 97%  Weight:      Height:       Thin adult female, lying in bed, uncomfortable Tachycardic, no murmurs, no peripheral edema Respiratory rate shallow, no rales, no wheezing, abdomen is distended and tender, guarding. Attention diminished, affect blunted, judgment and insight appear slightly impaired, face symmetric, speech fluent, generalized weakness but symmetric strength         Data Reviewed: Discussed with general surgery Potassium down to 3.0, creatinine stable White blood cell count slightly down to 15 Abdominal x-ray, personally reviewed, significantly worse than yesterday Hemogram shows hemoglobin up to 9 INR 2.1 Platelets up to 100      Family Communication: Husband at the bedside    Disposition: Status is: Inpatient          Author: Edwin Dada, MD 04/12/2022 4:39 PM  For on call review [email or web address].

## 2022-04-13 ENCOUNTER — Inpatient Hospital Stay: Payer: Self-pay

## 2022-04-13 ENCOUNTER — Inpatient Hospital Stay (HOSPITAL_COMMUNITY): Payer: 59

## 2022-04-13 DIAGNOSIS — G9341 Metabolic encephalopathy: Secondary | ICD-10-CM | POA: Diagnosis not present

## 2022-04-13 DIAGNOSIS — A419 Sepsis, unspecified organism: Secondary | ICD-10-CM | POA: Diagnosis not present

## 2022-04-13 DIAGNOSIS — Z7189 Other specified counseling: Secondary | ICD-10-CM | POA: Diagnosis not present

## 2022-04-13 DIAGNOSIS — Z515 Encounter for palliative care: Secondary | ICD-10-CM

## 2022-04-13 DIAGNOSIS — R338 Other retention of urine: Secondary | ICD-10-CM | POA: Diagnosis not present

## 2022-04-13 DIAGNOSIS — I7410 Embolism and thrombosis of unspecified parts of aorta: Secondary | ICD-10-CM | POA: Diagnosis not present

## 2022-04-13 DIAGNOSIS — R531 Weakness: Secondary | ICD-10-CM | POA: Diagnosis not present

## 2022-04-13 LAB — COMPREHENSIVE METABOLIC PANEL
ALT: 14 U/L (ref 0–44)
AST: 20 U/L (ref 15–41)
Albumin: 1.9 g/dL — ABNORMAL LOW (ref 3.5–5.0)
Alkaline Phosphatase: 83 U/L (ref 38–126)
Anion gap: 8 (ref 5–15)
BUN: 12 mg/dL (ref 6–20)
CO2: 20 mmol/L — ABNORMAL LOW (ref 22–32)
Calcium: 8.7 mg/dL — ABNORMAL LOW (ref 8.9–10.3)
Chloride: 113 mmol/L — ABNORMAL HIGH (ref 98–111)
Creatinine, Ser: 0.39 mg/dL — ABNORMAL LOW (ref 0.44–1.00)
GFR, Estimated: >60 mL/min (ref >60)
Glucose, Bld: 98 mg/dL (ref 70–99)
Potassium: 3.3 mmol/L — ABNORMAL LOW (ref 3.5–5.1)
Sodium: 141 mmol/L (ref 135–145)
Total Bilirubin: 2.2 mg/dL — ABNORMAL HIGH (ref 0.3–1.2)
Total Protein: 5.7 g/dL — ABNORMAL LOW (ref 6.5–8.1)

## 2022-04-13 LAB — CULTURE, BLOOD (ROUTINE X 2)
Culture: NO GROWTH
Culture: NO GROWTH
Special Requests: ADEQUATE

## 2022-04-13 LAB — CBC
HCT: 30.4 % — ABNORMAL LOW (ref 36.0–46.0)
Hemoglobin: 10.4 g/dL — ABNORMAL LOW (ref 12.0–15.0)
MCH: 33.7 pg (ref 26.0–34.0)
MCHC: 34.2 g/dL (ref 30.0–36.0)
MCV: 98.4 fL (ref 80.0–100.0)
Platelets: 166 10*3/uL (ref 150–400)
RBC: 3.09 MIL/uL — ABNORMAL LOW (ref 3.87–5.11)
RDW: 24.8 % — ABNORMAL HIGH (ref 11.5–15.5)
WBC: 19.3 10*3/uL — ABNORMAL HIGH (ref 4.0–10.5)
nRBC: 0 % (ref 0.0–0.2)

## 2022-04-13 LAB — PROTIME-INR
INR: 1.8 — ABNORMAL HIGH (ref 0.8–1.2)
Prothrombin Time: 21 seconds — ABNORMAL HIGH (ref 11.4–15.2)

## 2022-04-13 MED ORDER — SODIUM CHLORIDE 0.9% FLUSH
10.0000 mL | INTRAVENOUS | Status: DC | PRN
  Administered 2022-04-23: 10 mL

## 2022-04-13 MED ORDER — LORAZEPAM 2 MG/ML IJ SOLN
1.0000 mg | Freq: Once | INTRAMUSCULAR | Status: AC
  Administered 2022-04-13: 1 mg via INTRAVENOUS
  Filled 2022-04-13: qty 1

## 2022-04-13 MED ORDER — POTASSIUM CHLORIDE 10 MEQ/100ML IV SOLN
10.0000 meq | INTRAVENOUS | Status: AC
  Administered 2022-04-13 (×4): 10 meq via INTRAVENOUS
  Filled 2022-04-13 (×4): qty 100

## 2022-04-13 MED ORDER — SODIUM CHLORIDE 0.9% FLUSH
10.0000 mL | Freq: Two times a day (BID) | INTRAVENOUS | Status: DC
  Administered 2022-04-13 – 2022-04-14 (×2): 10 mL

## 2022-04-13 MED ORDER — LIP MEDEX EX OINT
TOPICAL_OINTMENT | CUTANEOUS | Status: DC | PRN
  Administered 2022-04-13: 75 via TOPICAL
  Filled 2022-04-13: qty 7

## 2022-04-13 NOTE — Progress Notes (Signed)
Patient medicated with Ativan per MD orders.  Attempted to place NGT with CN x3.  Patient unable to follow commands and swallow and continued to jerk her head whenever we attempted to pass the NGT.  On call aware.  Wants staff to try again "after some time".  Will continue to monitor and attempt again if patient is more cooperative later.

## 2022-04-13 NOTE — Progress Notes (Signed)
  Progress Note   Patient: Angela Mcpherson [id number]:[id number] DOB: 11/29/1960 DOA: 04/03/2022     10 DOS: the patient was seen and examined on 04/13/2022 at 11:40AM      Brief hospital course: Mrs. Piasecki is a 61 y.o. 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     8/17: Admitted on antibiotics for colitis 8/18: Blood cultures growing E coli 8/21: WBC rising, ID consulted 8/22: Still worsening, CT shows new left sided abscess, not present on admission; Gen Surg and IR consulted 8/23: Underwent perc drain placement 8/24: Increasing abdominal pain overnight, no flatus or BM yet, HRs up, Abd x-ray shows dilated SB 8/26: SB more dilated, NG placed 8/27: NG removed overnight, Palliative consulted, patient more somnolent     Assessment and Plan: * Severe sepsis E coli and citrobacter bacteremia Pelvic abscess in female s/p percutaneous drain Blood and urine cultures with GNRs, abscess aspirate with E coli and prevotella, report still pending   - Continue Cefepime, Flagyl  - Follow abscess culture to finalize    Ileus Persistent.  NG placed 8/26, removed overnight.  Too delirious this morning to replace. - Attempt to replace when able - Continue IVF   Delirium Persists. - Standard delirium precautions: blinds open and lights on during day, TV off, minimize interruptions at night, PT/OT, avoiding Beers list medications    Coagulopathy INR slightly down. - Hold apixaban for now - Follow INR    Hypokalemia - Supplement K again    Acute urinary retention Left hydronephrosis due to mass Foley removed and voiding okay   Aortic mural thrombus - Contineu to hold Eliquis given coagulapathy - Consult Hematology, appreciate cares               Subjective: No fever overnight, no change in abdomen distention, no flatus or bowel movement, no fever, confusion  is worse, she pulled out her NG overnight..     Physical Exam: Vitals:   04/12/22 1320 04/12/22 2132 04/13/22 0350 04/13/22 1202  BP: 119/79 130/89 124/86 118/86  Pulse: (!) 119 (!) 123 (!) 123 (!) 124  Resp: '18 20 20 '$ (!) 24  Temp: 99 F (37.2 C) (!) 97.5 F (36.4 C) (!) 97.5 F (36.4 C) 98 F (36.7 C)  TempSrc: Oral Oral Oral Oral  SpO2: 97% 100% 100% 97%  Weight:      Height:       Thin adult female, lying in bed, sluggish and somnolent Tachycardic, no murmurs, 2+ lower extremity edema  Respiratory rate shallow, no rales or wheezing Abdomen distended, feels like an ileus Attention diminished, affect blunted, judgment insight appear impaired, generalized weakness         Data Reviewed: Potassium still low, creatinine stable White blood cell count no change Hemoglobin stable, INR slightly down       Family Communication: Husband at the bedside    Disposition: Status is: Inpatient          Author: Edwin Dada, MD 04/13/2022 4:07 PM  For on call review [email or web address].

## 2022-04-13 NOTE — Progress Notes (Signed)
Pharmacy: TPN TPN ordered after 12 noon deadline. PICC ordered for TPN access> just ordered, not yet placed.  Plan: order TPN labs for 8/28 TPN will start 8/28 at 1800 once PICC placed Will consult RD for goals  Eudelia Bunch, Pharm.D 04/13/2022 4:48 PM

## 2022-04-13 NOTE — Progress Notes (Addendum)
Subjective/Chief Complaint: Somnolent after meds, pulled ng overnight   Objective: Vital signs in last 24 hours: Temp:  [97.5 F (36.4 C)-99 F (37.2 C)] 97.5 F (36.4 C) (08/27 0350) Pulse Rate:  [119-123] 123 (08/27 0350) Resp:  [18-20] 20 (08/27 0350) BP: (119-130)/(79-89) 124/86 (08/27 0350) SpO2:  [97 %-100 %] 100 % (08/27 0350) Last BM Date : 04/11/22  Intake/Output from previous day: 08/26 0701 - 08/27 0700 In: 2289.7 [I.V.:1889.7; IV Piggyback:400] Out: 950 [Urine:575; Emesis/NG output:350; Drains:25] Intake/Output this shift: No intake/output data recorded.  Ab distended, drain with thin output  Lab Results:  Recent Labs    04/12/22 0511 04/13/22 0420  WBC 15.4* 19.3*  HGB 9.9* 10.4*  HCT 28.6* 30.4*  PLT 110* 166   BMET Recent Labs    04/12/22 0511 04/13/22 0420  NA 139 141  K 3.0* 3.3*  CL 112* 113*  CO2 22 20*  GLUCOSE 105* 98  BUN 10 12  CREATININE 0.40* 0.39*  CALCIUM 8.6* 8.7*   PT/INR Recent Labs    04/12/22 0511 04/13/22 0420  LABPROT 22.9* 21.0*  INR 2.1* 1.8*   ABG No results for input(s): "PHART", "HCO3" in the last 72 hours.  Invalid input(s): "PCO2", "PO2"  Studies/Results: DG Abd 1 View  Result Date: 04/12/2022 CLINICAL DATA:  NG tube placement EXAM: ABDOMEN - 1 VIEW COMPARISON:  Study done earlier today FINDINGS: There is interval placement of enteric tube with its tip in the medial aspect of fundus of the stomach. Dilated bowel loops are seen in upper abdomen. There are linear densities in the lower lung fields suggesting possible subsegmental atelectasis. IMPRESSION: Tip of enteric tube is seen in the stomach. Dilation of bowel loops may be due to ileus or partial obstruction. Linear densities are seen in both lower lung fields suggesting atelectasis. Electronically Signed   By: Elmer Picker M.D.   On: 04/12/2022 15:13   DG Abd Portable 1V  Result Date: 04/12/2022 CLINICAL DATA:  Ileus EXAM: PORTABLE ABDOMEN -  1 VIEW COMPARISON:  04/10/2022 FINDINGS: Interval placement of percutaneous pigtail drainage catheter within the low left pelvis. Prominent air distended loops of large and small bowel throughout the abdomen. No gross free intraperitoneal air. No acute bony findings. IMPRESSION: Prominent air distended loops of large and small bowel throughout the abdomen, most suggestive of ileus. Distal obstruction not excluded. Continued radiographic follow-up recommended. Electronically Signed   By: Davina Poke D.O.   On: 04/12/2022 09:59    Anti-infectives: Anti-infectives (From admission, onward)    Start     Dose/Rate Route Frequency Ordered Stop   04/08/22 2100  ceFEPIme (MAXIPIME) 2 g in sodium chloride 0.9 % 100 mL IVPB        2 g 200 mL/hr over 30 Minutes Intravenous Every 12 hours 04/08/22 1453     04/08/22 1600  metroNIDAZOLE (FLAGYL) IVPB 500 mg        500 mg 100 mL/hr over 60 Minutes Intravenous Every 12 hours 04/08/22 1453     04/08/22 1515  piperacillin-tazobactam (ZOSYN) IVPB 3.375 g  Status:  Discontinued        3.375 g 12.5 mL/hr over 240 Minutes Intravenous Every 8 hours 04/08/22 1428 04/08/22 1447   04/07/22 2000  ceFEPIme (MAXIPIME) 2 g in sodium chloride 0.9 % 100 mL IVPB  Status:  Discontinued        2 g 200 mL/hr over 30 Minutes Intravenous Every 12 hours 04/07/22 1600 04/08/22 1428   04/04/22 0900  cefTRIAXone (ROCEPHIN) 2 g in sodium chloride 0.9 % 100 mL IVPB  Status:  Discontinued        2 g 200 mL/hr over 30 Minutes Intravenous Every 24 hours 04/04/22 0257 04/07/22 1600   04/03/22 1700  ceFEPIme (MAXIPIME) 2 g in sodium chloride 0.9 % 100 mL IVPB  Status:  Discontinued        2 g 200 mL/hr over 30 Minutes Intravenous Every 8 hours 04/03/22 1634 04/04/22 0257   04/03/22 1500  metroNIDAZOLE (FLAGYL) IVPB 500 mg  Status:  Discontinued        500 mg 100 mL/hr over 60 Minutes Intravenous Every 12 hours 04/03/22 1446 04/04/22 0257   04/03/22 1045  ceFEPIme (MAXIPIME) 2 g in  sodium chloride 0.9 % 100 mL IVPB        2 g 200 mL/hr over 30 Minutes Intravenous  Once 04/03/22 1031 04/03/22 1429   04/03/22 1045  vancomycin (VANCOCIN) IVPB 1000 mg/200 mL premix        1,000 mg 200 mL/hr over 60 Minutes Intravenous  Once 04/03/22 1031 04/03/22 1241       Assessment/Plan: Metastatic colorectal cancer on chemotherapy pelvic abscess concern for contained perforation at cancer.  - no indication for emergent surgical intervention - no peritonitis and not a good surgical candidate given malnourishment and recent chemotherapy.  She may need a proximal diversion for this however but needs to be in better condition. I am not sure she would do well at this point.  Or a g tube for drainage.   -will attempt ng tube again today -IR drain on 8/23, at some point will need f/u ct scan -cx gram - rods, gram + cocci  - WBC 19.3 today,Continue abx - ID following - situation difficult with stage IV colorectal cancer and her medical problems. I think TPN is also a good plan in case she does need something   FEN: NPO, IVFs ID: [email or web address] VTE: eliquis on hold, INR 1.8 I discussed plan with her husband  I reviewed hospitalist notes, last 24 h vitals and pain scores, last 48 h intake and output, last 24 h labs and trends, and last 24 h imaging results.  This care required straight-forward level of medical decision making.    Rolm Bookbinder 04/13/2022   Below is risk calculator for colostomy only and I think this is underestimate as does not take in to account liver dysfunction

## 2022-04-13 NOTE — Consult Note (Signed)
Consultation Note Date: 04/13/2022   Patient Name: Angela Mcpherson  DOB: 21-Sep-1960  MRN: [id number]  Age / Sex: 61 y.o., female  PCP: Default, Provider, MD Referring Physician: Edwin Dada, *  Reason for Consultation: Establishing goals of care  HPI/Patient Profile: 61 y.o. female   admitted on 04/03/2022    Clinical Assessment and Goals of Care: 61 year old female who lives at home with her husband and 3 dogs in [place], [place] with life limiting illness of metastatic colorectal cancer, admitted to hospital medicine service with general surgery colleagues following for pelvic abscess concerning for contained perforation of cancer.  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  Also has history of mural thrombus on Eliquis, initially admitted for abdominal pain and confusion.  Was placed on antibiotics for colitis and blood cultures showing E. coli.  Repeat abdominal imaging showed new left-sided abscess that was not present on admission, underwent percutaneous drain.  Attempts at NG tube are being made because of abdominal x-ray showing dilated small bowel loops. Palliative medicine consultation has been requested for CODE STATUS and broad goals of care discussions. Patient is resting in bed, she awakens some but does not verbalize.  She appears with generalized weakness and is not awake alert enough to carry on full goals of care conversations.  Patient's husband Milca Sytsma is present at the bedside.  I introduced myself and palliative care as follows: Palliative medicine is specialized medical care for people living with serious illness. It focuses on providing relief from the symptoms and stress of a serious illness. The goal is to improve quality of life for both the patient and the family. Goals of care: Broad aims of medical therapy in relation to the patient's  values and preferences. Our aim is to provide medical care aimed at enabling patients to achieve the goals that matter most to them, given the circumstances of their particular medical situation and their constraints.   Brief life review performed.  Patient's husband describes her as a very strong independent woman.  Goals wishes and values attempted to be explored.  Discussed frankly but compassionately with the patient's husband at the bedside about how this current hospitalization is going as well as the patient's underlying serious illness.  Patient's husband states that he is well aware of the fact that she has stage IV cancer and that she is up against a lot of things.  We discussed about CODE STATUS in detail.  Differences between full code versus DNR/DNI were explored.  Patient's husband elects for continuation of full CODE STATUS for now.  He states that if the patient ends up on tubes and machines and is not awake alert anymore with no meaningful recovery, then, at that time, he will elect for liberation from tubes and machines and a focus on comfort care at that time, but for now, he wishes to continue with full CODE STATUS.  He states that he is aware of the serious nature of the patient's illness but  hopes that that she will be able to recover from this acute event.  He is agreeable to NG tube and initiation of TPN. For active listening and supportive care, compassionate empathic presence along with other therapeutic techniques at the time of this initial palliative encounter.  Thank you for the consult.  NEXT OF KIN  Spouse   SUMMARY OF RECOMMENDATIONS   Code/full scope for now. NG tube to be replaced, TPN to be initiated.  Husband understands that in the current situation, patient is very high risk for any kind of surgical interventions and would like to continue with current plan of NG tube and TPN and current mode of care.  While he remains hopeful that the patient will overcome this acute  decline, he states that he is aware of the serious nature of the patient's condition.  We talked about time trial of current interventions for the next few days and then to continue our goals of care discussions and CODE STATUS discussions and he is in agreement.  PMT to follow.  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 The following aspects are pertinent.  Past Medical History:  Diagnosis Date   Colorectal cancer ([place])    Family history of brain cancer    Family history of prostate cancer    Family history of stomach cancer    Heart murmur    dx at 26   MVP is stable   Hemorrhoids    Social History   Socioeconomic History   Marital status: Married    Spouse name: Not on file   Number of children: 0   Years of education: Not on file   Highest education level: Not on file  Occupational History   Not on file  Tobacco Use   Smoking status: Every Day    Packs/day: 1.00    Years: 26.00    Total pack years: 26.00    Types: Cigarettes   Smokeless tobacco: Never  Vaping Use   Vaping Use: Never used  Substance and Sexual Activity   Alcohol use: Not Currently    Comment: used to drink moderately   Drug use: Never   Sexual activity: Not on file  Other Topics Concern   Not on file  Social  History Narrative   Not on file   Social Determinants of Health   Financial Resource Strain: Not on file  Food Insecurity: Not on file  Transportation Needs: Not on file  Physical Activity: Not on file  Stress: Not on file  Social Connections: Not on file   Family History  Problem Relation Age of Onset   Prostate cancer Father    Brain cancer Maternal Aunt    Prostate cancer Paternal Uncle    Stomach cancer Maternal Grandfather 15   Colon cancer Neg Hx    Colon polyps Neg Hx    Esophageal cancer Neg Hx    Rectal cancer Neg Hx  Scheduled Meds:  Chlorhexidine Gluconate Cloth  6 each Topical Q0600   multivitamin with minerals  1 tablet Oral Daily   sodium chloride flush  5 mL Intracatheter Q8H   Continuous Infusions:  0.9 % NaCl with KCl 20 mEq / L 75 mL/hr at 04/13/22 0604   ceFEPime (MAXIPIME) IV 2 g (04/13/22 0911)   metronidazole 500 mg (04/13/22 0949)   potassium chloride 10 mEq (04/13/22 1114)   PRN Meds:.acetaminophen **OR** acetaminophen, HYDROmorphone (DILAUDID) injection, mouth rinse, prochlorperazine Medications Prior to Admission:  Prior to Admission medications   Medication Sig Start Date End Date Taking? Authorizing Provider  acetaminophen (TYLENOL) 325 MG tablet Take 650 mg by mouth every 6 (six) hours as needed for moderate pain.   Yes [provider]  capecitabine (XELODA) 500 MG tablet TAKE 2 TABLETS EVERY 12 HOURS  FOR 14 DAYS ON THEN 7 DAYS OFF, TAKE WITH MEALS Patient taking differently: Take 1,000 mg/m2 by mouth 2 (two) times daily after a meal. 03/24/22  Yes Truitt Merle, MD  cholecalciferol (VITAMIN D3) 25 MCG (1000 UNIT) tablet Take 1,000 Units by mouth daily.   Yes [provider]  ELIQUIS 5 MG TABS tablet TAKE 1 TABLET BY MOUTH TWICE A DAY Patient taking differently: Take 5 mg by mouth 2 (two) times daily. 03/24/22  Yes Truitt Merle, MD  KLOR-CON M20 20 MEQ tablet TAKE 1 TABLET BY MOUTH TWICE A DAY Patient taking differently: Take 20 mEq  by mouth daily. 12/13/21  Yes Truitt Merle, MD  loperamide (IMODIUM) 2 MG capsule Take 1 capsule (2 mg total) by mouth as needed for diarrhea or loose stools. Patient taking differently: Take 2 mg by mouth daily as needed for diarrhea or loose stools. 05/27/21  Yes Nicole Kindred A, DO  ondansetron (ZOFRAN) 8 MG tablet Take 1 tablet (8 mg total) by mouth 2 (two) times daily as needed for refractory nausea / vomiting. Start on day 3 after chemotherapy. Patient taking differently: Take 8 mg by mouth 2 (two) times daily as needed for refractory nausea / vomiting. 03/06/21  Yes Truitt Merle, MD  diphenoxylate-atropine (LOMOTIL) 2.5-0.025 MG tablet Take 2 tablets by mouth 4 (four) times daily as needed for diarrhea or loose stools. Patient not taking: Reported on 04/03/2022 01/06/22   Truitt Merle, MD   Allergies  Allergen Reactions   Codeine     unknown   Epinephrine (Anaphylaxis)     unknown   Orange Fruit [Citrus] Other (See Comments)    Blisters on face.   Streptogramins     unknown   Tomato Other (See Comments)    Blisters on face   Review of Systems Not awake, appears with generalized weakness Physical Exam Not awake, appears generalized weakness Shallow regular breath sounds Abdomen is distended  Vital Signs: BP 118/86 (BP Location: Right Arm)   Pulse (!) 124   Temp 98 F (36.7 C) (Oral)   Resp (!) 24   Ht '5\' 4"'$  (1.626 m)   Wt 62.7 kg   SpO2 97%   BMI 23.73 kg/m  Pain Scale: Faces POSS *See Group Information*: S-Acceptable,Sleep, easy to arouse Pain Score: Asleep   SpO2: SpO2: 97 % O2 Device:SpO2: 97 % O2 Flow Rate: .O2 Flow Rate (L/min): 4 L/min  IO: Intake/output summary:  Intake/Output Summary (Last 24 hours) at 04/13/2022 1208 Last data filed at 04/13/2022 0604 Gross per 24 hour  Intake 2289.74 ml  Output 690 ml  Net 1599.74 ml    LBM: Last BM Date :  04/11/22 Baseline Weight: Weight: 49.3 kg Most recent weight: Weight: 62.7 kg     Palliative  Assessment/Data:   PPS 30%  Time In:  11 Time Out:  12 Time Total:  60  Greater than 50%  of this time was spent counseling and coordinating care related to the above assessment and plan.  Signed by: Loistine Chance, MD   Please contact Palliative Medicine Team phone at [phone] for questions and concerns.  For individual provider: See Shea Evans

## 2022-04-13 NOTE — Progress Notes (Signed)
Peripherally Inserted Central Catheter Placement  The IV Nurse has discussed with the patient and/or persons authorized to consent for the patient, the purpose of this procedure and the potential benefits and risks involved with this procedure.  The benefits include less needle sticks, lab draws from the catheter, and the patient may be discharged home with the catheter. Risks include, but not limited to, infection, bleeding, blood clot (thrombus formation), and puncture of an artery; nerve damage and irregular heartbeat and possibility to perform a PICC exchange if needed/ordered by physician.  Alternatives to this procedure were also discussed.  Bard Power PICC patient education guide, fact sheet on infection prevention and patient information card has been provided to patient /or left at bedside.    PICC Placement Documentation  PICC Double Lumen 04/13/22 Right Brachial 32 cm 0 cm (Active)  Indication for Insertion or Continuance of Line Administration of hyperosmolar/irritating solutions (i.e. TPN, Vancomycin, etc.) 04/13/22 1906  Exposed Catheter (cm) 0 cm 04/13/22 1906  Site Assessment Clean, Dry, Intact 04/13/22 1906  Lumen #1 Status Flushed;Saline locked;Blood return noted 04/13/22 1906  Lumen #2 Status Flushed;Saline locked;Blood return noted 04/13/22 1906  Dressing Type Transparent;Securing device 04/13/22 1906  Dressing Status Antimicrobial disc in place 04/13/22 1906  Safety [place] Not Applicable 25/63/89 3734  Line Care Connections checked and tightened 04/13/22 1906  Line Adjustment (NICU/IV Team Only) No 04/13/22 1906  Dressing Intervention New dressing 04/13/22 1906  Dressing Change Due 04/20/22 04/13/22 1906       Mickel Baas  Halley Kincer 04/13/2022, 7:08 PM

## 2022-04-13 NOTE — Progress Notes (Signed)
16 french NG inserted without difficulty, correct placement verified by auscultation.  Immediate removal of 100 mls dark bile.  X-ray confirmation pending.

## 2022-04-13 NOTE — Progress Notes (Signed)
Rounding on patient and found she had pulled her NGT.  Patient has been without pain or nausea throughout shift and has had roughly 250cc out via NGT.  Contacted MD on call regarding replacing NGT and orders received to give Ativan prior to attempting as prior RN had difficult time with patient pulling at the tube.  CN aware and will help this nurse replace NGT.

## 2022-04-14 ENCOUNTER — Inpatient Hospital Stay: Payer: 59

## 2022-04-14 ENCOUNTER — Inpatient Hospital Stay (HOSPITAL_COMMUNITY): Payer: 59

## 2022-04-14 ENCOUNTER — Inpatient Hospital Stay: Payer: 59 | Admitting: Hematology

## 2022-04-14 DIAGNOSIS — B9689 Other specified bacterial agents as the cause of diseases classified elsewhere: Secondary | ICD-10-CM | POA: Diagnosis not present

## 2022-04-14 DIAGNOSIS — R338 Other retention of urine: Secondary | ICD-10-CM | POA: Diagnosis not present

## 2022-04-14 DIAGNOSIS — A419 Sepsis, unspecified organism: Secondary | ICD-10-CM | POA: Diagnosis not present

## 2022-04-14 DIAGNOSIS — I7410 Embolism and thrombosis of unspecified parts of aorta: Secondary | ICD-10-CM | POA: Diagnosis not present

## 2022-04-14 DIAGNOSIS — G9341 Metabolic encephalopathy: Secondary | ICD-10-CM | POA: Diagnosis not present

## 2022-04-14 DIAGNOSIS — N730 Acute parametritis and pelvic cellulitis: Secondary | ICD-10-CM

## 2022-04-14 DIAGNOSIS — R6520 Severe sepsis without septic shock: Secondary | ICD-10-CM | POA: Diagnosis not present

## 2022-04-14 LAB — CBC
HCT: 29 % — ABNORMAL LOW (ref 36.0–46.0)
Hemoglobin: 9.7 g/dL — ABNORMAL LOW (ref 12.0–15.0)
MCH: 33.7 pg (ref 26.0–34.0)
MCHC: 33.4 g/dL (ref 30.0–36.0)
MCV: 100.7 fL — ABNORMAL HIGH (ref 80.0–100.0)
Platelets: 132 10*3/uL — ABNORMAL LOW (ref 150–400)
RBC: 2.88 MIL/uL — ABNORMAL LOW (ref 3.87–5.11)
RDW: 25 % — ABNORMAL HIGH (ref 11.5–15.5)
WBC: 11.9 10*3/uL — ABNORMAL HIGH (ref 4.0–10.5)
nRBC: 0 % (ref 0.0–0.2)

## 2022-04-14 LAB — COMPREHENSIVE METABOLIC PANEL
ALT: 13 U/L (ref 0–44)
AST: 17 U/L (ref 15–41)
Albumin: 1.9 g/dL — ABNORMAL LOW (ref 3.5–5.0)
Alkaline Phosphatase: 72 U/L (ref 38–126)
Anion gap: 5 (ref 5–15)
BUN: 16 mg/dL (ref 6–20)
CO2: 22 mmol/L (ref 22–32)
Calcium: 8.5 mg/dL — ABNORMAL LOW (ref 8.9–10.3)
Chloride: 117 mmol/L — ABNORMAL HIGH (ref 98–111)
Creatinine, Ser: 0.41 mg/dL — ABNORMAL LOW (ref 0.44–1.00)
GFR, Estimated: >60 mL/min (ref >60)
Glucose, Bld: 112 mg/dL — ABNORMAL HIGH (ref 70–99)
Potassium: 4 mmol/L (ref 3.5–5.1)
Sodium: 144 mmol/L (ref 135–145)
Total Bilirubin: 1.8 mg/dL — ABNORMAL HIGH (ref 0.3–1.2)
Total Protein: 5.4 g/dL — ABNORMAL LOW (ref 6.5–8.1)

## 2022-04-14 LAB — PROTIME-INR
INR: 2.1 — ABNORMAL HIGH (ref 0.8–1.2)
Prothrombin Time: 23 seconds — ABNORMAL HIGH (ref 11.4–15.2)

## 2022-04-14 LAB — MAGNESIUM: Magnesium: 1.7 mg/dL (ref 1.7–2.4)

## 2022-04-14 LAB — PHOSPHORUS: Phosphorus: 2.8 mg/dL (ref 2.5–4.6)

## 2022-04-14 LAB — GLUCOSE, CAPILLARY: Glucose-Capillary: 142 mg/dL — ABNORMAL HIGH (ref 70–99)

## 2022-04-14 LAB — TRIGLYCERIDES: Triglycerides: 47 mg/dL (ref <150)

## 2022-04-14 MED ORDER — TRAVASOL 10 % IV SOLN
INTRAVENOUS | Status: AC
  Filled 2022-04-14: qty 480

## 2022-04-14 MED ORDER — PIPERACILLIN-TAZOBACTAM 3.375 G IVPB
3.3750 g | Freq: Three times a day (TID) | INTRAVENOUS | Status: DC
  Administered 2022-04-14 – 2022-04-21 (×20): 3.375 g via INTRAVENOUS
  Filled 2022-04-14 (×20): qty 50

## 2022-04-14 MED ORDER — MAGNESIUM SULFATE 2 GM/50ML IV SOLN
2.0000 g | Freq: Once | INTRAVENOUS | Status: AC
  Administered 2022-04-14: 2 g via INTRAVENOUS
  Filled 2022-04-14: qty 50

## 2022-04-14 MED ORDER — SODIUM CHLORIDE 0.9 % IV SOLN: INTRAVENOUS | Status: AC

## 2022-04-14 MED ORDER — INSULIN ASPART 100 UNIT/ML IJ SOLN
0.0000 [IU] | Freq: Three times a day (TID) | INTRAMUSCULAR | Status: DC
  Administered 2022-04-14: 1 [IU] via SUBCUTANEOUS
  Administered 2022-04-15: 2 [IU] via SUBCUTANEOUS
  Administered 2022-04-15: 1 [IU] via SUBCUTANEOUS
  Administered 2022-04-15: 2 [IU] via SUBCUTANEOUS
  Administered 2022-04-16: 1 [IU] via SUBCUTANEOUS

## 2022-04-14 NOTE — Progress Notes (Signed)
PHARMACY - TOTAL PARENTERAL NUTRITION CONSULT NOTE   Indication: Prolonged ileus  Patient Measurements: Height: '5\' 4"'$  (162.6 cm) Weight: 62.7 kg (138 lb 3.7 oz) IBW/kg (Calculated) : 54.7 TPN AdjBW (KG): 49.3 Body mass index is 23.73 kg/m. Usual Weight:   Assessment:  61 y.o. 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  8/22 CT shows new left sided abscess, not present on admission; on 8/23, CCS and IR consulted and underwent perc drain placement. On 8/24, AXR showed dilated SB and NGT placed on 8/26.  Patient removed NGT 8/27 and unable to replace at this time. On 8/27, Pharmacy consulted to dose TPN.  Glucose / Insulin: No hx DM. Goal CBGs 100-150. Serum glucose 117 this AM. Electrolytes: All WNL including Corr Cal 10.18 except Mg at low end 1.7. Renal: BUN/SCr WNL Hepatic: AST/ALT, AlkPhos WNL, Tbili elevated 1.8 Intake / Output; urine x 3 occurrences recorded, no stools; 20cc drains; 780cc NGT output  MIVF: NS w/ 20KCl at 75/hr GI Imaging: as above GI Surgeries / Procedures:   Central access: PICC TPN start date: 8/28  Nutritional Goals: Goal TPN rate is 75 mL/hr (provides 90 g of protein and 1818 kcals per day)  RD Assessment: 8/24 Estimated Needs Total Energy Estimated Needs: 1700-2000 kcal Total Protein Estimated Needs: 85-95 grams Total Fluid Estimated Needs: >/= 2 L/day  Current Nutrition:  NPO  Plan:  Now: Mg Sulfate 2g IV x 1  Start TPN at 57m/hr at 1800 This will provide 48g protein, 969kcal per day Electrolytes in TPN:  Na 586m/L,  K 5073mL,  Ca 5mE64m,  Mg 5mEq60m  Phos 15mmo82m  Cl:Ac 1:2 Add standard MVI and trace elements to TPN Initiate Sensitive q8h SSI and adjust as needed  Reduce MIVF to 35 mL/hr at 1800 and change to NS Monitor TPN labs on Mon/Thurs, CMET/Mg/Phos in AM  Nzinga Ferran WPeggyann JubamD,  BCPS Pharmacy: [phone],7:22 AM

## 2022-04-14 NOTE — Progress Notes (Signed)
  Progress Note   Patient: Angela Mcpherson GLO:[id number] DOB: June 27, 1961 DOA: 04/03/2022     11 DOS: the patient was seen and examined on 04/14/2022 at 10:36 AM      Brief hospital course: Mrs. Birchmeier is a 61 y.o. 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     8/17: Admitted on antibiotics for colitis 8/18: Blood cultures growing E coli 8/21: WBC rising, ID consulted 8/22: Still worsening, CT shows new left sided abscess, not present on admission; Gen Surg and IR consulted 8/23: Underwent perc drain placement 8/24: Increasing abdominal pain overnight, no flatus or BM yet, HRs up, Abd x-ray shows dilated SB 8/26: SB more dilated, NG placed 8/27: NG removed overnight, Palliative consulted, patient more somnolent     Assessment and Plan: * Severe sepsis ([place])  Pelvic abscess in female s/p perc drain E coli and citrobacter bacteremia Blood cultures with E coli and Citrobacter brakii, urine culture with Klebsiella, all CTX susceptible - Continue cefepime, flagyl - Consult ID, appreciate cares - Place drain today - Follow culture data  - Continue foley for urinary retention    Ileus  Radiographically unchanged, clinically unchanged.  Note: She is fairly edematous. -Place PICC, start TPN - Maintain NG - Appreciate general surgery insight   Acute metabolic encephalopathy This has recurred, in the setting of bad ileus, poor oral intake.     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  Previously compensated.  No ascites now, just elevated INR, low albumin.  MELD actually 26.  Edema noted     Aortic mural thrombus ([facility]) - Hold ELiquis given coagulapathy   Malignant neoplasm of rectosigmoid junction ([place]) - COnsult Oncology          Subjective: Pulled  her NG out twice overnight, still making good urine, fairly large amount of green bilious output from her NG tube, belly still unchanged, swollen and distended, she remains dazed and confused     Physical Exam: Vitals:   04/14/22 0440 04/14/22 1353 04/14/22 1559 04/14/22 1900  BP: (!) 158/83 132/79 138/89 125/84  Pulse: (!) 119 (!) 119 (!) 121 (!) 116  Resp: (!) 23 (!) 24 (!) 25 (!) 25  Temp: 98.3 F (36.8 C) 98.4 F (36.9 C) 97.7 F (36.5 C) (!) 97.5 F (36.4 C)  TempSrc: Axillary Oral Oral Oral  SpO2: 97% 97% 99% 95%  Weight:      Height:       Thin adult female, wan complexion, lying in bed, tired Tachycardic, regular, no murmurs, 2+ lower extremity edema Respiratory rate increased, no rales or wheezes Abdomen still distended, mildly tender to deep palpation, otherwise nothing focal Attention diminished, affect blunted, confused at times, severe generalized weakness    Data Reviewed: Discussed with general surgery team Basic metabolic panel shows sodium up to 144, potassium normal Creatinine normal, albumin 1.9 Total bilirubin down to 1.8, INR still 2.1 White blood cell count down to 11 Platelets 132, hemoglobin stable 9.7   Family Communication: Husband at the bedside    Disposition: Status is: Inpatient         Author: Edwin Dada, MD 04/14/2022 7:11 PM  For on call review [email or web address].

## 2022-04-14 NOTE — Progress Notes (Signed)
Nutrition Follow-up  DOCUMENTATION CODES:   Non-severe (moderate) malnutrition in context of chronic illness  INTERVENTION:   Monitor magnesium, potassium, and phosphorus BID for at least 3 days, MD to replete as needed, as pt is at risk for refeeding syndrome.  -TPN management per Pharmacy -Recommend 100 mg Thiamine x 5 days given refeeding syndrome risk  -If diet advanced, resume Ensure Plus High Protein po BID, each supplement provides 350 kcal and 20 grams of protein.   NUTRITION DIAGNOSIS:   Moderate Malnutrition related to chronic illness, cancer and cancer related treatments as evidenced by moderate fat depletion, moderate muscle depletion.  Ongoing.  GOAL:   Patient will meet greater than or equal to 90% of their needs  Not meeting.  MONITOR:   PO intake, Supplement acceptance, Diet advancement, Labs, Weight trends  REASON FOR ASSESSMENT:   Consult New TPN/TNA  ASSESSMENT:   61 y.o. female with medical history of for aortic mural thrombus and rectal cancer on oral chemo. She presented to the ED due to abdominal pain and confusion. Her last chemo was 2 days PTA. At home it was noted that she was lethargic and having loose stools. In the ED there was concern for colitis so she was admitted for further management. Patient admitted with dx of sepsis.  8/23: IR drain placement  Patient in room sleeping, visitor sleeping at bedside as well.   Pt has been NPO since 8/24. TPN to start today at 40 ml/hr. Pt with NGT placed, set to suction.  Per nursing documentation, pt with moderate BLE edema.   Medications: IV Mg sulfate  Labs reviewed.  Diet Order:   Diet Order             Diet NPO time specified Except for: Ice Chips, Other (See Comments), Sips with Meds  Diet effective now                   EDUCATION NEEDS:   Education needs have been addressed  Skin:  Skin Assessment: Reviewed RN Assessment  Last BM:  8/25 -type 7  Height:   Ht  Readings from Last 1 Encounters:  04/04/22 '5\' 4"'$  (1.626 m)    Weight:   Wt Readings from Last 1 Encounters:  04/10/22 62.7 kg   BMI:  Body mass index is 23.73 kg/m.  Estimated Nutritional Needs:   Kcal:  1700-2000 kcal  Protein:  85-95 grams  Fluid:  >/= 2 L/day   Clayton Bibles, MS, RD, LDN Inpatient Clinical Dietitian Contact information available via Amion

## 2022-04-14 NOTE — Progress Notes (Signed)
Patient removed NGT with safety mitts in place.  This RN attempted to replace without success x1.  CN called and assisted with reinsertion.  NGT placed to L nare and XR ordered to check placement.  Abdominal contents aspirated on insertion.  Bil soft wrist restraints applied per MD order.  Patient educated on purpose for restraints as she is awake and more lucid than earlier.  Reports understanding however continues to move towards the tube when restraints not secured to bed.  Will contact husband at [place] opportunity.

## 2022-04-14 NOTE — Progress Notes (Signed)
Assigned nurse request assistance will reinsertion of NGT after patient removed tube. Re-Inserted back in left nare will minimal difficulty. Noted old blood in nare. Mild bleeding from frequent attempts to place NGT. Awaiting portable Abdominal xray to confirm placement. Will cont to monitor.

## 2022-04-14 NOTE — Assessment & Plan Note (Signed)
X-ray unchanged.  Unclear to me if this is ileus or mechanical obstruction from her perforated circumferential sigmoid mass.  Gen Surg suspect the latter.  They have considered that diverting colostomy may help if rectal obstruction is the cause.  At very least, they would offer a venting gastrostomy tube, so that patient had the option to discharge home with Hospice.  In the meantime, Dr. Hilma Favors recommends starting Decadron to reduce inflammation and hopefully relieve obstruction.  - Maintain NG for now - Continue TPN  - Decadron and Octreotide per Palliative Care

## 2022-04-14 NOTE — Progress Notes (Signed)
PT Cancellation Note  Patient Details Name: Angela Mcpherson MRN: [id number] DOB: February 22, 1961   Cancelled Treatment:    Reason Eval/Treat Not Completed: Patient declined, no reason specified. Spoke with pateint and husband. Encouraged her to try to get up and walk. Will check back on tomorrow to see if she is agreeable.       [facility]  Office: [phone] Pager: [phone]

## 2022-04-14 NOTE — Progress Notes (Addendum)
Referring Physician(s): Gross, S  Supervising Physician: Aletta Edouard  Patient Status:  [facility] - In-pt  Chief Complaint:  Pelvic abscess s/p L transgluteal drain placement   Subjective:  Pt lying in bed with husband at bedside. She appears confused and delayed in response.   Allergies: Codeine, Epinephrine (anaphylaxis), Orange fruit [citrus], Streptogramins, and Tomato  Medications: Prior to Admission medications   Medication Sig Start Date End Date Taking? Authorizing Provider  acetaminophen (TYLENOL) 325 MG tablet Take 650 mg by mouth every 6 (six) hours as needed for moderate pain.   Yes [provider]  capecitabine (XELODA) 500 MG tablet TAKE 2 TABLETS EVERY 12 HOURS  FOR 14 DAYS ON THEN 7 DAYS OFF, TAKE WITH MEALS Patient taking differently: Take 1,000 mg/m2 by mouth 2 (two) times daily after a meal. 03/24/22  Yes Truitt Merle, MD  cholecalciferol (VITAMIN D3) 25 MCG (1000 UNIT) tablet Take 1,000 Units by mouth daily.   Yes [provider]  ELIQUIS 5 MG TABS tablet TAKE 1 TABLET BY MOUTH TWICE A DAY Patient taking differently: Take 5 mg by mouth 2 (two) times daily. 03/24/22  Yes Truitt Merle, MD  KLOR-CON M20 20 MEQ tablet TAKE 1 TABLET BY MOUTH TWICE A DAY Patient taking differently: Take 20 mEq by mouth daily. 12/13/21  Yes Truitt Merle, MD  loperamide (IMODIUM) 2 MG capsule Take 1 capsule (2 mg total) by mouth as needed for diarrhea or loose stools. Patient taking differently: Take 2 mg by mouth daily as needed for diarrhea or loose stools. 05/27/21  Yes Nicole Kindred A, DO  ondansetron (ZOFRAN) 8 MG tablet Take 1 tablet (8 mg total) by mouth 2 (two) times daily as needed for refractory nausea / vomiting. Start on day 3 after chemotherapy. Patient taking differently: Take 8 mg by mouth 2 (two) times daily as needed for refractory nausea / vomiting. 03/06/21  Yes Truitt Merle, MD  diphenoxylate-atropine (LOMOTIL) 2.5-0.025 MG tablet Take 2 tablets by mouth 4 (four)  times daily as needed for diarrhea or loose stools. Patient not taking: Reported on 04/03/2022 01/06/22   Truitt Merle, MD     Vital Signs: BP 132/79 (BP Location: Left Wrist)   Pulse (!) 119   Temp 98.4 F (36.9 C) (Oral)   Resp (!) 24   Ht '5\' 4"'$  (1.626 m)   Wt 138 lb 3.7 oz (62.7 kg)   SpO2 97%   BMI 23.73 kg/m   Physical Exam Vitals reviewed.  Constitutional:      General: She is not in acute distress.    Appearance: She is ill-appearing.  HENT:     Nose:     Comments: NG L nare to IWS Eyes:     Extraocular Movements: Extraocular movements intact.     Pupils: Pupils are equal, round, and reactive to light.  Pulmonary:     Effort: Pulmonary effort is normal. No respiratory distress.  Skin:    General: Skin is warm and dry.     Comments: Drain flushes easily. Does not aspirate. Site unremarkable with sutures/statlock in place.Scant amt purulent OP in JP. Dressing is damp and torn. RN to place new dressing.   Neurological:     Mental Status: She is alert. She is disoriented.  Psychiatric:        Mood and Affect: Mood normal.        Behavior: Behavior normal.     Imaging: DG Abd 1 View  Result Date: 04/14/2022 CLINICAL DATA:  Check  gastric catheter placement EXAM: ABDOMEN - 1 VIEW COMPARISON:  Film from the previous day. FINDINGS: Gastric catheter is again coiled within the stomach. Scattered large and small bowel gas is noted with persistent small bowel dilatation. Pigtail catheter is noted in the pelvis consistent with recent pelvic abscess drainage IMPRESSION: Gastric catheter within the stomach. Stable gaseous distension of the large and small bowel. Electronically Signed   By: Inez Catalina M.D.   On: 04/14/2022 02:52   [place] EKG SITE RITE  Result Date: 04/13/2022 If [facility] image not attached, placement could not be confirmed due to current cardiac rhythm.  DG Abd 1 View  Result Date: 04/13/2022 CLINICAL DATA:  Evaluate NG tube EXAM: ABDOMEN - 1 VIEW COMPARISON:   April 12, 2022 FINDINGS: The NG tube is within the stomach. Air-filled dilated loops of large and small bowel again identified. IMPRESSION: The NG tube terminates in the stomach. Air-filled dilated loops of large and small bowel are again identified. The findings are most suggestive ileus. Electronically Signed   By: Dorise Bullion III M.D.   On: 04/13/2022 13:46   DG Abd 1 View  Result Date: 04/12/2022 CLINICAL DATA:  NG tube placement EXAM: ABDOMEN - 1 VIEW COMPARISON:  Study done earlier today FINDINGS: There is interval placement of enteric tube with its tip in the medial aspect of fundus of the stomach. Dilated bowel loops are seen in upper abdomen. There are linear densities in the lower lung fields suggesting possible subsegmental atelectasis. IMPRESSION: Tip of enteric tube is seen in the stomach. Dilation of bowel loops may be due to ileus or partial obstruction. Linear densities are seen in both lower lung fields suggesting atelectasis. Electronically Signed   By: Elmer Picker M.D.   On: 04/12/2022 15:13   DG Abd Portable 1V  Result Date: 04/12/2022 CLINICAL DATA:  Ileus EXAM: PORTABLE ABDOMEN - 1 VIEW COMPARISON:  04/10/2022 FINDINGS: Interval placement of percutaneous pigtail drainage catheter within the low left pelvis. Prominent air distended loops of large and small bowel throughout the abdomen. No gross free intraperitoneal air. No acute bony findings. IMPRESSION: Prominent air distended loops of large and small bowel throughout the abdomen, most suggestive of ileus. Distal obstruction not excluded. Continued radiographic follow-up recommended. Electronically Signed   By: Davina Poke D.O.   On: 04/12/2022 09:59   DG Abd Portable 1V  Result Date: 04/10/2022 CLINICAL DATA:  Abdominal pain EXAM: PORTABLE ABDOMEN - 1 VIEW COMPARISON:  04/08/2022 FINDINGS: Increased small bowel distension compared to prior, bowel loops dilated up to 6.9 cm. No gross intramural air. IMPRESSION:  Worsening bowel distension compared to prior, ileus versus distal bowel obstruction. Electronically Signed   By: Donavan Foil M.D.   On: 04/10/2022 23:05    Labs:  CBC: Recent Labs    04/11/22 0426 04/12/22 0511 04/13/22 0420 04/14/22 0403  WBC 16.8* 15.4* 19.3* 11.9*  HGB 9.1* 9.9* 10.4* 9.7*  HCT 26.7* 28.6* 30.4* 29.0*  PLT 97* 110* 166 132*    COAGS: Recent Labs    05/18/21 2044 05/19/21 0618 04/11/22 0426 04/12/22 0511 04/13/22 0420 04/14/22 0403  INR 1.3*   < > 2.1* 2.1* 1.8* 2.1*  APTT 24  --   --   --   --   --    < > = values in this interval not displayed.    BMP: Recent Labs    04/11/22 0426 04/12/22 0511 04/13/22 0420 04/14/22 0403  NA 137 139 141 144  K 3.6  3.0* 3.3* 4.0  CL 115* 112* 113* 117*  CO2 19* 22 20* 22  GLUCOSE 100* 105* 98 112*  BUN '10 10 12 16  '$ CALCIUM 8.2* 8.6* 8.7* 8.5*  CREATININE 0.45 0.40* 0.39* 0.41*  GFRNONAA >60 >60 >60 >60    LIVER FUNCTION TESTS: Recent Labs    04/11/22 0426 04/12/22 0511 04/13/22 0420 04/14/22 0403  BILITOT 1.7* 2.3* 2.2* 1.8*  AST '20 19 20 17  '$ ALT '17 19 14 13  '$ ALKPHOS 84 89 83 72  PROT 4.7* 5.3* 5.7* 5.4*  ALBUMIN 1.7* 1.8* 1.9* 1.9*    Assessment and Plan: Pelvic abscess s/p L transgluteal drain placement with Dr. Earleen Newport 04/09/22.   Pt lying in bed with husband at bedside. She is a poor historian d/t confusion. Pt is delayed in her responses. Pt husband states they placed NG tube for bowel obstruction. Pt has restraints on bed but not applied at this time for pulling NG tube several times.    Drain flushes easily. Does not aspirate. Site unremarkable with sutures/statlock in place.Scant amt purulent OP in JP. Dressing is damp and torn. RN to place new dressing. 20 cc documented OP over last 24 hours.   WBC 11.9 (19.3), afebrile  Continue TID flushes with 5 cc NS. Record output Q shift. Dressing changes QD or PRN if soiled.  Call IR APP or on call IR MD if difficulty flushing or sudden  change in drain output.  Repeat imaging/possible drain injection once output < 10 mL/QD (excluding flush material.)   Discharge planning: Please contact IR APP or on call IR MD prior to patient d/c to ensure appropriate follow up plans are in place. Typically patient will follow up with IR clinic 10-14 days post d/c for repeat imaging/possible drain injection. IR scheduler will contact patient with date/time of appointment. Patient will need to flush drain QD with 5 cc NS, record output QD, dressing changes every 2-3 days or earlier if soiled.    IR will continue to follow - please call with questions or concerns.    Electronically Signed: Tyson Alias, NP 04/14/2022, 2:13 PM   I spent a total of 15 Minutes at the the patient's bedside AND on the patient's hospital floor or unit, greater than 50% of which was counseling/coordinating care for pelvic abscess.

## 2022-04-14 NOTE — TOC Progression Note (Signed)
Transition of Care [facility]) - Progression Note    Patient Details  Name: Angela Mcpherson MRN: [id number] Date of Birth: [birth year]  Transition of Care [facility] CM/SW [place], RN Phone Number: 04/14/2022, 4:17 PM  Clinical Narrative:   Notified Pam with [employer] for potential home IV abx.  TOC will continue to follow.    Expected Discharge Plan: [place] Barriers to Discharge: No Barriers Identified  Expected Discharge Plan and Services Expected Discharge Plan: [place] In-house Referral: NA Discharge Planning Services: CM Consult Post Acute Care Choice: Home Health, Durable Medical Equipment Living arrangements for the past 2 months: Single Family Home                 DME Arranged: N/A DME Agency: NA       HH Arranged: NA HH Agency: NA         Social Determinants of Health (SDOH) Interventions    Readmission Risk Interventions    04/11/2022   11:52 AM  Readmission Risk Prevention Plan  Transportation Screening Complete  PCP or Specialist Appt within 3-5 Days Complete  HRI or [place] Complete  Social Work Consult for [place] Planning/Counseling Complete  Palliative Care Screening Not Applicable  Medication Review [employer] photographer) Complete

## 2022-04-14 NOTE — Progress Notes (Signed)
[facility] Progress Note     Subjective: CC-  NG replaced yesterday, 780cc bilious output. Not complaining of any abdominal pain this morning. No flatus or BM. Asking for water  Objective: Vital signs in last 24 hours: Temp:  [98 F (36.7 C)-98.4 F (36.9 C)] 98.3 F (36.8 C) (08/28 0440) Pulse Rate:  [119-124] 119 (08/28 0440) Resp:  [23-24] 23 (08/28 0440) BP: (118-158)/(75-86) 158/83 (08/28 0440) SpO2:  [95 %-97 %] 97 % (08/28 0440) Last BM Date : 04/11/22  Intake/Output from previous day: 08/27 0701 - 08/28 0700 In: 1947.4 [I.V.:1147.4; IV Piggyback:800] Out: 0938 [Urine:850; Emesis/NG output:780; Drains:20] Intake/Output this shift: No intake/output data recorded.  PE: Gen:  Alert but drowsy, NAD Abd: distended but somewhat soft and nontender, hyperactive bowel sounds  Lab Results:  Recent Labs    04/13/22 0420 04/14/22 0403  WBC 19.3* 11.9*  HGB 10.4* 9.7*  HCT 30.4* 29.0*  PLT 166 132*   BMET Recent Labs    04/13/22 0420 04/14/22 0403  NA 141 144  K 3.3* 4.0  CL 113* 117*  CO2 20* 22  GLUCOSE 98 112*  BUN 12 16  CREATININE 0.39* 0.41*  CALCIUM 8.7* 8.5*   PT/INR Recent Labs    04/13/22 0420 04/14/22 0403  LABPROT 21.0* 23.0*  INR 1.8* 2.1*   CMP     Component Value Date/Time   NA 144 04/14/2022 0403   K 4.0 04/14/2022 0403   CL 117 (H) 04/14/2022 0403   CO2 22 04/14/2022 0403   GLUCOSE 112 (H) 04/14/2022 0403   BUN 16 04/14/2022 0403   CREATININE 0.41 (L) 04/14/2022 0403   CREATININE 0.55 03/24/2022 1029   CALCIUM 8.5 (L) 04/14/2022 0403   PROT 5.4 (L) 04/14/2022 0403   ALBUMIN 1.9 (L) 04/14/2022 0403   AST 17 04/14/2022 0403   AST 28 03/24/2022 1029   ALT 13 04/14/2022 0403   ALT 19 03/24/2022 1029   ALKPHOS 72 04/14/2022 0403   BILITOT 1.8 (H) 04/14/2022 0403   BILITOT 1.8 (H) 03/24/2022 1029   GFRNONAA >60 04/14/2022 0403   GFRNONAA >60 03/24/2022 1029   GFRAA  06/26/2010 0755    >60        The eGFR has  been calculated using the MDRD equation. This calculation has not been validated in all clinical situations. eGFR's persistently <60 mL/min signify possible Chronic Kidney Disease.   Lipase     Component Value Date/Time   LIPASE 44 04/03/2022 1126       Studies/Results: DG Abd 1 View  Result Date: 04/14/2022 CLINICAL DATA:  Check gastric catheter placement EXAM: ABDOMEN - 1 VIEW COMPARISON:  Film from the previous day. FINDINGS: Gastric catheter is again coiled within the stomach. Scattered large and small bowel gas is noted with persistent small bowel dilatation. Pigtail catheter is noted in the pelvis consistent with recent pelvic abscess drainage IMPRESSION: Gastric catheter within the stomach. Stable gaseous distension of the large and small bowel. Electronically Signed   By: Inez Catalina M.D.   On: 04/14/2022 02:52   [place] EKG SITE RITE  Result Date: 04/13/2022 If [facility] image not attached, placement could not be confirmed due to current cardiac rhythm.  DG Abd 1 View  Result Date: 04/13/2022 CLINICAL DATA:  Evaluate NG tube EXAM: ABDOMEN - 1 VIEW COMPARISON:  April 12, 2022 FINDINGS: The NG tube is within the stomach. Air-filled dilated loops of large and small bowel again identified. IMPRESSION: The NG tube terminates in  the stomach. Air-filled dilated loops of large and small bowel are again identified. The findings are most suggestive ileus. Electronically Signed   By: Dorise Bullion III M.D.   On: 04/13/2022 13:46   DG Abd 1 View  Result Date: 04/12/2022 CLINICAL DATA:  NG tube placement EXAM: ABDOMEN - 1 VIEW COMPARISON:  Study done earlier today FINDINGS: There is interval placement of enteric tube with its tip in the medial aspect of fundus of the stomach. Dilated bowel loops are seen in upper abdomen. There are linear densities in the lower lung fields suggesting possible subsegmental atelectasis. IMPRESSION: Tip of enteric tube is seen in the stomach. Dilation of  bowel loops may be due to ileus or partial obstruction. Linear densities are seen in both lower lung fields suggesting atelectasis. Electronically Signed   By: Elmer Picker M.D.   On: 04/12/2022 15:13    Anti-infectives: Anti-infectives (From admission, onward)    Start     Dose/Rate Route Frequency Ordered Stop   04/08/22 2100  ceFEPIme (MAXIPIME) 2 g in sodium chloride 0.9 % 100 mL IVPB        2 g 200 mL/hr over 30 Minutes Intravenous Every 12 hours 04/08/22 1453     04/08/22 1600  metroNIDAZOLE (FLAGYL) IVPB 500 mg        500 mg 100 mL/hr over 60 Minutes Intravenous Every 12 hours 04/08/22 1453     04/08/22 1515  piperacillin-tazobactam (ZOSYN) IVPB 3.375 g  Status:  Discontinued        3.375 g 12.5 mL/hr over 240 Minutes Intravenous Every 8 hours 04/08/22 1428 04/08/22 1447   04/07/22 2000  ceFEPIme (MAXIPIME) 2 g in sodium chloride 0.9 % 100 mL IVPB  Status:  Discontinued        2 g 200 mL/hr over 30 Minutes Intravenous Every 12 hours 04/07/22 1600 04/08/22 1428   04/04/22 0900  cefTRIAXone (ROCEPHIN) 2 g in sodium chloride 0.9 % 100 mL IVPB  Status:  Discontinued        2 g 200 mL/hr over 30 Minutes Intravenous Every 24 hours 04/04/22 0257 04/07/22 1600   04/03/22 1700  ceFEPIme (MAXIPIME) 2 g in sodium chloride 0.9 % 100 mL IVPB  Status:  Discontinued        2 g 200 mL/hr over 30 Minutes Intravenous Every 8 hours 04/03/22 1634 04/04/22 0257   04/03/22 1500  metroNIDAZOLE (FLAGYL) IVPB 500 mg  Status:  Discontinued        500 mg 100 mL/hr over 60 Minutes Intravenous Every 12 hours 04/03/22 1446 04/04/22 0257   04/03/22 1045  ceFEPIme (MAXIPIME) 2 g in sodium chloride 0.9 % 100 mL IVPB        2 g 200 mL/hr over 30 Minutes Intravenous  Once 04/03/22 1031 04/03/22 1429   04/03/22 1045  vancomycin (VANCOCIN) IVPB 1000 mg/200 mL premix        1,000 mg 200 mL/hr over 60 Minutes Intravenous  Once 04/03/22 1031 04/03/22 1241        Assessment/Plan Metastatic colorectal  cancer on chemotherapy Pelvic abscess concern for contained perforation at cancer - no indication for emergent surgical intervention - no peritonitis - She may need a proximal diversion for this or a G tube for drainage, but needs to be in better condition. Currently not a good surgical candidate given malnourishment and recent chemotherapy.   - TPN to start today - Continue NG to LIWS - s/p IR drain on 8/23, at some point  will need f/u CT scan - cx E COLI pansensitive, report pending. 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

## 2022-04-14 NOTE — Progress Notes (Signed)
[facility] for Infectious Disease   Reason for visit: Follow up on pelvic abscess, colitis, bowel obstruction  Interval History: still distended.  Husband at bedside.  WBC down to 11.9.  remains afebrile.  NGT in place.      Physical Exam: Constitutional:  Vitals:   04/14/22 0440 04/14/22 1353  BP: (!) 158/83 132/79  Pulse: (!) 119 (!) 119  Resp: (!) 23 (!) 24  Temp: 98.3 F (36.8 C) 98.4 F (36.9 C)  SpO2: 97% 97%   patient appears in NAD Respiratory: Normal respiratory effort  Review of Systems: Constitutional: negative for fevers and chills  Lab Results  Component Value Date   WBC 11.9 (H) 04/14/2022   HGB 9.7 (L) 04/14/2022   HCT 29.0 (L) 04/14/2022   MCV 100.7 (H) 04/14/2022   PLT 132 (L) 04/14/2022    Lab Results  Component Value Date   CREATININE 0.41 (L) 04/14/2022   BUN 16 04/14/2022   NA 144 04/14/2022   K 4.0 04/14/2022   CL 117 (H) 04/14/2022   CO2 22 04/14/2022    Lab Results  Component Value Date   ALT 13 04/14/2022   AST 17 04/14/2022   ALKPHOS 72 04/14/2022     Microbiology: Recent Results (from the past 240 hour(s))  Culture, blood (Routine X 2) w Reflex to ID Panel     Status: None   Collection Time: 04/08/22  9:30 AM   Specimen: BLOOD  Result Value Ref Range Status   Specimen Description   Final    BLOOD SITE NOT SPECIFIED Performed at [facility], [place] [address] [place], [place] [address]    Special Requests   Final    BOTTLES DRAWN AEROBIC AND ANAEROBIC Blood Culture adequate volume Performed at [place] [address], [place], [place] [address]    Culture   Final    NO GROWTH 5 DAYS Performed at [facility], [place] [address] [place], [place] [address]    Report Status 04/13/2022 FINAL  Final  Culture, blood (Routine X 2) w Reflex to ID Panel     Status: None   Collection Time: 04/08/22  9:59 AM   Specimen: BLOOD  Result Value Ref Range Status   Specimen  Description   Final    BLOOD Performed at [place] [facility] [address] [place], [place] [address]    Special Requests   Final    BLOOD Performed at [facility], [place] [address], [place], [place] [address]    Culture   Final    NO GROWTH 5 DAYS Performed at [facility], [place] [address], [place], [place] [address]    Report Status 04/13/2022 FINAL  Final  Aerobic/Anaerobic Culture w Gram Stain (surgical/deep wound)     Status: None (Preliminary result)   Collection Time: 04/09/22  4:24 PM   Specimen: Abscess  Result Value Ref Range Status   Specimen Description   Final    ABSCESS Performed at [facility] [address] [place], [place] [address]    Special Requests ABDOMEN  Final   Gram Stain   Final    ABUNDANT WBC PRESENT, PREDOMINANTLY PMN ABUNDANT GRAM NEGATIVE RODS FEW GRAM POSITIVE COCCI IN PAIRS    Culture   Final    ABUNDANT ESCHERICHIA COLI MODERATE PREVOTELLA DENTICOLA BETA LACTAMASE POSITIVE RARE PSEUDOMONAS AERUGINOSA MODERATE ENTEROCOCCUS FAECALIS SUSCEPTIBILITIES TO FOLLOW Performed at [facility], [address] [place], [place] [address]    Report Status PENDING  Incomplete   Organism ID, Bacteria ESCHERICHIA COLI  Final      Susceptibility   Escherichia coli - MIC*    AMPICILLIN <=2 SENSITIVE Sensitive     CEFAZOLIN <=4 SENSITIVE Sensitive     CEFEPIME <=0.12 SENSITIVE Sensitive     CEFTAZIDIME <=1 SENSITIVE Sensitive     CEFTRIAXONE <=0.25 SENSITIVE Sensitive     CIPROFLOXACIN <=0.25 SENSITIVE Sensitive     GENTAMICIN <=1 SENSITIVE Sensitive     IMIPENEM <=0.25 SENSITIVE Sensitive     TRIMETH/SULFA <=20 SENSITIVE Sensitive     AMPICILLIN/SULBACTAM <=2 SENSITIVE Sensitive     PIP/TAZO <=4 SENSITIVE Sensitive     * ABUNDANT ESCHERICHIA COLI    Impression/Plan:  1. E coli and citrobacter bacteremia - repeat cultures have remained negative.  Now s/p > 7 days of treatment.  No  further antibiotics indicated for this.  2.  Pelvic abscess - perforation of the rectosigmoid colon with contained abscess.  Multiple organisms including Pseudomonas, Enterococcus.  Will change antibiotics to piperacillin/tazobactam to treat all.  Drain in place and will continue to monitor.   3.  Nutrition - to start TPN for nutrition support.

## 2022-04-15 ENCOUNTER — Encounter (HOSPITAL_COMMUNITY): Payer: Self-pay | Admitting: Internal Medicine

## 2022-04-15 DIAGNOSIS — R338 Other retention of urine: Secondary | ICD-10-CM | POA: Diagnosis not present

## 2022-04-15 DIAGNOSIS — A419 Sepsis, unspecified organism: Secondary | ICD-10-CM | POA: Diagnosis not present

## 2022-04-15 DIAGNOSIS — I7410 Embolism and thrombosis of unspecified parts of aorta: Secondary | ICD-10-CM | POA: Diagnosis not present

## 2022-04-15 DIAGNOSIS — G9341 Metabolic encephalopathy: Secondary | ICD-10-CM | POA: Diagnosis not present

## 2022-04-15 LAB — COMPREHENSIVE METABOLIC PANEL
ALT: 13 U/L (ref 0–44)
AST: 18 U/L (ref 15–41)
Albumin: 1.8 g/dL — ABNORMAL LOW (ref 3.5–5.0)
Alkaline Phosphatase: 66 U/L (ref 38–126)
Anion gap: 3 — ABNORMAL LOW (ref 5–15)
BUN: 15 mg/dL (ref 6–20)
CO2: 23 mmol/L (ref 22–32)
Calcium: 8.2 mg/dL — ABNORMAL LOW (ref 8.9–10.3)
Chloride: 116 mmol/L — ABNORMAL HIGH (ref 98–111)
Creatinine, Ser: 0.41 mg/dL — ABNORMAL LOW (ref 0.44–1.00)
GFR, Estimated: >60 mL/min (ref >60)
Glucose, Bld: 162 mg/dL — ABNORMAL HIGH (ref 70–99)
Potassium: 3.2 mmol/L — ABNORMAL LOW (ref 3.5–5.1)
Sodium: 142 mmol/L (ref 135–145)
Total Bilirubin: 1.7 mg/dL — ABNORMAL HIGH (ref 0.3–1.2)
Total Protein: 5.3 g/dL — ABNORMAL LOW (ref 6.5–8.1)

## 2022-04-15 LAB — CBC
HCT: 28.8 % — ABNORMAL LOW (ref 36.0–46.0)
Hemoglobin: 9.5 g/dL — ABNORMAL LOW (ref 12.0–15.0)
MCH: 33.9 pg (ref 26.0–34.0)
MCHC: 33 g/dL (ref 30.0–36.0)
MCV: 102.9 fL — ABNORMAL HIGH (ref 80.0–100.0)
Platelets: 102 10*3/uL — ABNORMAL LOW (ref 150–400)
RBC: 2.8 MIL/uL — ABNORMAL LOW (ref 3.87–5.11)
RDW: 25.2 % — ABNORMAL HIGH (ref 11.5–15.5)
WBC: 10.3 10*3/uL (ref 4.0–10.5)
nRBC: 0 % (ref 0.0–0.2)

## 2022-04-15 LAB — PROTIME-INR
INR: 2 — ABNORMAL HIGH (ref 0.8–1.2)
Prothrombin Time: 22.1 seconds — ABNORMAL HIGH (ref 11.4–15.2)

## 2022-04-15 LAB — AEROBIC/ANAEROBIC CULTURE W GRAM STAIN (SURGICAL/DEEP WOUND)

## 2022-04-15 LAB — MAGNESIUM: Magnesium: 1.7 mg/dL (ref 1.7–2.4)

## 2022-04-15 LAB — GLUCOSE, CAPILLARY
Glucose-Capillary: 170 mg/dL — ABNORMAL HIGH (ref 70–99)
Glucose-Capillary: 154 mg/dL — ABNORMAL HIGH (ref 70–99)
Glucose-Capillary: 131 mg/dL — ABNORMAL HIGH (ref 70–99)
Glucose-Capillary: 181 mg/dL — ABNORMAL HIGH (ref 70–99)

## 2022-04-15 LAB — PHOSPHORUS: Phosphorus: 2 mg/dL — ABNORMAL LOW (ref 2.5–4.6)

## 2022-04-15 MED ORDER — SODIUM CHLORIDE 0.9 % IV SOLN: INTRAVENOUS | Status: AC

## 2022-04-15 MED ORDER — DEXTROSE 5 % IV SOLN
5.0000 mg | Freq: Once | INTRAVENOUS | Status: AC
  Administered 2022-04-15: 5 mg via INTRAVENOUS
  Filled 2022-04-15: qty 0.5

## 2022-04-15 MED ORDER — DEXAMETHASONE SODIUM PHOSPHATE 10 MG/ML IJ SOLN
8.0000 mg | Freq: Every day | INTRAMUSCULAR | Status: DC
  Administered 2022-04-15 – 2022-04-17 (×3): 8 mg via INTRAVENOUS
  Filled 2022-04-15 (×4): qty 1

## 2022-04-15 MED ORDER — POTASSIUM PHOSPHATES 15 MMOLE/5ML IV SOLN
15.0000 mmol | Freq: Once | INTRAVENOUS | Status: AC
  Administered 2022-04-15: 15 mmol via INTRAVENOUS
  Filled 2022-04-15: qty 5

## 2022-04-15 MED ORDER — TRAVASOL 10 % IV SOLN
INTRAVENOUS | Status: AC
  Filled 2022-04-15: qty 660

## 2022-04-15 MED ORDER — SODIUM CHLORIDE 0.9 % IV SOLN
50.0000 ug/h | INTRAVENOUS | Status: DC
  Administered 2022-04-15: 25 ug/h via INTRAVENOUS
  Administered 2022-04-17 – 2022-04-18 (×3): 50 ug/h via INTRAVENOUS
  Filled 2022-04-15 (×10): qty 1

## 2022-04-15 MED ORDER — POTASSIUM CHLORIDE 10 MEQ/100ML IV SOLN
10.0000 meq | INTRAVENOUS | Status: AC
  Administered 2022-04-15 (×2): 10 meq via INTRAVENOUS
  Filled 2022-04-15 (×2): qty 100

## 2022-04-15 MED ORDER — MAGNESIUM SULFATE 2 GM/50ML IV SOLN
2.0000 g | Freq: Once | INTRAVENOUS | Status: AC
  Administered 2022-04-15: 2 g via INTRAVENOUS
  Filled 2022-04-15: qty 50

## 2022-04-15 MED ORDER — OCTREOTIDE LOAD VIA INFUSION
25.0000 ug | Freq: Once | INTRAVENOUS | Status: AC
  Administered 2022-04-15: 25 ug via INTRAVENOUS
  Filled 2022-04-15: qty 13

## 2022-04-15 NOTE — Progress Notes (Addendum)
PHARMACY - TOTAL PARENTERAL NUTRITION CONSULT NOTE   Indication: Prolonged ileus  Patient Measurements: Height: '5\' 4"'$  (162.6 cm) Weight: 62.7 kg (138 lb 3.7 oz) IBW/kg (Calculated) : 54.7 TPN AdjBW (KG): 49.3 Body mass index is 23.73 kg/m. Usual Weight:   Assessment:  61 y.o. 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  8/22 CT shows new left sided abscess, not present on admission; on 8/23, CCS and IR consulted and underwent perc drain placement. On 8/24, AXR showed dilated SB and NGT placed on 8/26.  Patient removed NGT 8/27 and unable to replace at this time. On 8/27, Pharmacy consulted to dose TPN.  Glucose / Insulin: No hx DM. Goal CBGs 100-150.  - CBG range 142-170 since starting TPN - 3 units sSSI required Electrolytes: K/Mg/Phos low, Cl elevated, Corr Cal 9.96 WNL. Renal: BUN/SCr WNL Hepatic: AST/ALT, AlkPhos WNL, Tbili elevated 1.7 Intake / Output; urine 868m recorded, no stools; 520cc NGT output  MIVF: NS w/ 20KCl at 35/hr GI Imaging: as above GI Surgeries / Procedures:  8/28: s/p L transgluteal drain placement by IR  Central access: PICC TPN start date: 8/28  Nutritional Goals: Goal TPN rate is 75 mL/hr (provides 90 g of protein and 1818 kcals per day)  RD Assessment: 8/24 Estimated Needs Total Energy Estimated Needs: 1700-2000 kcal Total Protein Estimated Needs: 85-95 grams Total Fluid Estimated Needs: >/= 2 L/day  Current Nutrition:  NPO  Plan:  Now: Mg Sulfate 2g IV x 1, KCl 170m x 1, KPhos 1545m x 1  Increase TPN to 21m40m at 1800 This will provide 66g protein, 1332kcal per day Electrolytes in TPN:  Na 50mE71m  K 50mEq27m Ca 5mEq/L37mMg 5mEq/L,8mhos 15mmol/L80ml:Ac 1:2 Add standard MVI and trace elements to TPN Initiate Sensitive q8h SSI and adjust as needed  Add 100 mg Thiamine x 5 days given refeeding  syndrome risk per RD recommendations Reduce MIVF to 20 mL/hr at 1800  Monitor TPN labs on Mon/Thurs, CMET/Mg/Phos in AM  Geremy Rister WillPeggyann Juba BCPS Pharmacy: [phone],7:06 AM

## 2022-04-15 NOTE — Progress Notes (Signed)
Subjective/Chief Complaint: Patient reports no flatus NG tube functioning - still feels nauseated TPN has been started  Objective: Vital signs in last 24 hours: Temp:  [97.5 F (36.4 C)-98.4 F (36.9 C)] 97.5 F (36.4 C) (08/29 0545) Pulse Rate:  [116-121] 117 (08/29 0545) Resp:  [20-25] 20 (08/29 0545) BP: (125-146)/(79-93) 146/93 (08/29 0545) SpO2:  [95 %-99 %] 96 % (08/29 0545) Last BM Date : 04/11/22  Intake/Output from previous day: 08/28 0701 - 08/29 0700 In: 2034.3 [I.V.:1822.8; IV Piggyback:211.5] Out: 1325 [Urine:800; Emesis/NG output:525] Intake/Output this shift: No intake/output data recorded.  Alert, interactive Abd - distended, mildly tender  Lab Results:  Recent Labs    04/14/22 0403 04/15/22 0325  WBC 11.9* 10.3  HGB 9.7* 9.5*  HCT 29.0* 28.8*  PLT 132* 102*   BMET Recent Labs    04/14/22 0403 04/15/22 0325  NA 144 142  K 4.0 3.2*  CL 117* 116*  CO2 22 23  GLUCOSE 112* 162*  BUN 16 15  CREATININE 0.41* 0.41*  CALCIUM 8.5* 8.2*   PT/INR Recent Labs    04/14/22 0403 04/15/22 0325  LABPROT 23.0* 22.1*  INR 2.1* 2.0*   ABG No results for input(s): "PHART", "HCO3" in the last 72 hours.  Invalid input(s): "PCO2", "PO2"  Studies/Results: DG Abd 1 View  Result Date: 04/14/2022 CLINICAL DATA:  Check gastric catheter placement EXAM: ABDOMEN - 1 VIEW COMPARISON:  Film from the previous day. FINDINGS: Gastric catheter is again coiled within the stomach. Scattered large and small bowel gas is noted with persistent small bowel dilatation. Pigtail catheter is noted in the pelvis consistent with recent pelvic abscess drainage IMPRESSION: Gastric catheter within the stomach. Stable gaseous distension of the large and small bowel. Electronically Signed   By: Inez Catalina M.D.   On: 04/14/2022 02:52   [place] EKG SITE RITE  Result Date: 04/13/2022 If [facility] image not attached, placement could not be confirmed due to current cardiac rhythm.  DG  Abd 1 View  Result Date: 04/13/2022 CLINICAL DATA:  Evaluate NG tube EXAM: ABDOMEN - 1 VIEW COMPARISON:  April 12, 2022 FINDINGS: The NG tube is within the stomach. Air-filled dilated loops of large and small bowel again identified. IMPRESSION: The NG tube terminates in the stomach. Air-filled dilated loops of large and small bowel are again identified. The findings are most suggestive ileus. Electronically Signed   By: Dorise Bullion III M.D.   On: 04/13/2022 13:46    Anti-infectives: Anti-infectives (From admission, onward)    Start     Dose/Rate Route Frequency Ordered Stop   04/14/22 1800  piperacillin-tazobactam (ZOSYN) IVPB 3.375 g        3.375 g 12.5 mL/hr over 240 Minutes Intravenous Every 8 hours 04/14/22 1224     04/08/22 2100  ceFEPIme (MAXIPIME) 2 g in sodium chloride 0.9 % 100 mL IVPB  Status:  Discontinued        2 g 200 mL/hr over 30 Minutes Intravenous Every 12 hours 04/08/22 1453 04/14/22 1224   04/08/22 1600  metroNIDAZOLE (FLAGYL) IVPB 500 mg  Status:  Discontinued        500 mg 100 mL/hr over 60 Minutes Intravenous Every 12 hours 04/08/22 1453 04/14/22 1224   04/08/22 1515  piperacillin-tazobactam (ZOSYN) IVPB 3.375 g  Status:  Discontinued        3.375 g 12.5 mL/hr over 240 Minutes Intravenous Every 8 hours 04/08/22 1428 04/08/22 1447   04/07/22 2000  ceFEPIme (MAXIPIME) 2 g  in sodium chloride 0.9 % 100 mL IVPB  Status:  Discontinued        2 g 200 mL/hr over 30 Minutes Intravenous Every 12 hours 04/07/22 1600 04/08/22 1428   04/04/22 0900  cefTRIAXone (ROCEPHIN) 2 g in sodium chloride 0.9 % 100 mL IVPB  Status:  Discontinued        2 g 200 mL/hr over 30 Minutes Intravenous Every 24 hours 04/04/22 0257 04/07/22 1600   04/03/22 1700  ceFEPIme (MAXIPIME) 2 g in sodium chloride 0.9 % 100 mL IVPB  Status:  Discontinued        2 g 200 mL/hr over 30 Minutes Intravenous Every 8 hours 04/03/22 1634 04/04/22 0257   04/03/22 1500  metroNIDAZOLE (FLAGYL) IVPB 500 mg  Status:   Discontinued        500 mg 100 mL/hr over 60 Minutes Intravenous Every 12 hours 04/03/22 1446 04/04/22 0257   04/03/22 1045  ceFEPIme (MAXIPIME) 2 g in sodium chloride 0.9 % 100 mL IVPB        2 g 200 mL/hr over 30 Minutes Intravenous  Once 04/03/22 1031 04/03/22 1429   04/03/22 1045  vancomycin (VANCOCIN) IVPB 1000 mg/200 mL premix        1,000 mg 200 mL/hr over 60 Minutes Intravenous  Once 04/03/22 1031 04/03/22 1241       Assessment/Plan: Metastatic colorectal cancer on chemotherapy Pelvic abscess concern for contained perforation at cancer - no indication for emergent surgical intervention - no peritonitis - She may need a proximal diversion for the obstruction and/or a G tube for drainage, but needs to be in better condition. Currently not a good surgical candidate given malnourishment and recent chemotherapy.   - TPN - Continue NG to LIWS - s/p IR drain on 8/23, at some point will need f/u CT scan - cx E COLI pansensitive, report pending. WBC trending down, afebrile, ID following - INR elevated - will need FFP/ Vit K prior to any surgical intervention - Palliative involved    FEN: NPO/NGT, IVFs ID: [email or web address] VTE: eliquis on hold   Delirium Coagulopathy Left hydronephrosis due to mass Aortic mural thrombus on eliquis - eliquis on hold  LOS: 12 days    Angela Mcpherson 04/15/2022

## 2022-04-15 NOTE — Assessment & Plan Note (Signed)
-   Supp in TPN

## 2022-04-15 NOTE — Progress Notes (Signed)
Progress Note   Angela Mcpherson: Angela Mcpherson TFT:[id number] DOB: [birth year] DOA: 04/03/2022     12 DOS: the Angela Mcpherson was seen and examined on 04/15/2022 at 10:55AM      Brief hospital course: Angela Mcpherson is a 61 y.o. 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     8/17: Admitted on antibiotics for septic shock from colitis 8/18: Blood cultures growing E coli 8/21: WBC rising, ID consulted 8/22: Still worsening, CT showed new left sided abscess, not present on admission; Gen Surg and IR consulted 8/23: Underwent perc drain placement 8/24: Increasing abdominal pain --> x-ray shows ileus vs obstruction 8/26: NG placed 8/27: Angela Mcpherson pulled NG out overnight, Palliative consulted, encephalopathy worse 8/28: Remove NG tube twice more overnight, fourth NG tube placed this morning 8/29: Remains delirious, poorly responsive     Assessment and Plan: * Septic shock due to Escherichia coli (HCC) E coli and citrobacter bacteremia Pelvic abscess in female P/w HR 160, BP 113/78, RR 30, lactate 5.6, coagulopathy and encephalopathy.  Blood cultures with E coli and Citrobacter brakii, urine culture with Klebsiella, all CTX susceptible  Subsequently found to have pelvic abscess, drain placed.  Culture from drain growing Ecoli, Prevotella, Pseudomonas and E faecalis.  Cefepime/Flagyl changed to Zosyn.  Post-drain, Angela Mcpherson developed new abdominal distension and obstipation, ?ileus - Continue Zosyn - Consult ID and Gen Surg, appreciate cares        Ileus vs bowel obstruction X-ray unchanged.  Unclear to me if this is ileus or mechanical obstruction from her perforated circumferential sigmoid mass.  Gen Surg suspect the latter.  They have considered that diverting colostomy may help if rectal obstruction is the cause.  At very least, they would offer a venting gastrostomy tube, so that  Angela Mcpherson had the option to discharge home with Hospice.  In the meantime, Dr. Hilma Favors recommends starting Decadron to reduce inflammation and hopefully relieve obstruction.  - Maintain NG for now - Continue TPN  - Decadron and Octreotide per Palliative Care      Coagulopathy ([place]) Due to liver disease.  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  Previously compensated.  Now with mild ascites, elevated INR, low albumin.  MELD >20. - Check ammonia  History of aortic mural thrombus  Pre-existing aortic mural thrombus. 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    Given her perforation, deteriorating liver function and worsening functional status, the Angela Mcpherson's prognosis is darkening, becoming less likely she will be able to tolerate chemo again in the future. - Consult Palliative Care       Stable issues: Hypophosphatemia - Supp in TPN  Hypomagnesemia Resolved  Hypokalemia - Supplement in TPN  Malnutrition of moderate degree Severe malnutrition ruled out.   Acute urinary retention Left hydronephrosis due to mass Cr normal.  Able to remove foley after perc drain placed and has been voiding without difficulty.   Myocardial injury No angina, no ECG changes, demand  ischemia, myocardial infarction ruled out.  This is mild myocardial injuyr from sepsis, no further work up needed.  Normocytic anemia Hgb stable  Hepatitis C Hep C quant RNA this admission elevated.  No prior treatment.  Thrombocytopenia  (HCC) Stable            Subjective: No change in abdominal swelling, no fever overnight, no resumption of bowel function, no bowel movement or flatus.  She is making urine.  She remains confused.     Physical Exam: BP 131/70 (BP Location: Left Wrist)   Pulse (!) 113   Temp 97.8 F (36.6 C) (Oral)   Resp 18   Ht '5\' 4"'$  (1.626 m)   Wt 62.7 kg   SpO2 98%   BMI 23.73 kg/m   Frail elderly female, lying in bed, appears dazed and confused Tachycardic, regular, 2+ lower extremity edema Respiratory rate shallow and somewhat fast, no rales or wheezes appreciated Abdomen distended, feels like ileus/bowel obstruction, not ascites, mild diffuse tenderness, no guarding, no rebound Attention diminished, affect blunted, judgment and insight appear impaired, face symmetric, speech fluent, severe generalized weakness     Data Reviewed: Discussed with general surgery and palliative care Magnesium 1.7, phosphate low Potassium down to 3.2 Creatinine no change, albumin 1.8, no change White blood cell count down to 10 Hemoglobin 9.5 and no change Platelets down to 102K  Family Communication: Husband at the bedside    Disposition: Status is: Inpatient The Angela Mcpherson was admitted with septic shock from perforated rectal cancer.  She developed an abscess, which is now had a drain placed.  Subsequent to the drain placement, she has developed an ileus vs complete rectal obstruction  Palliative care are involved, it is becoming increasingly unlikely that the Angela Mcpherson will tolerate future chemo, and decisions need to be made about how best to palliate her severe intestinal obstruction        Author: Edwin Dada, MD 04/15/2022 7:20 PM  For on call review [email or web address].

## 2022-04-15 NOTE — Progress Notes (Signed)
Referring Physician(s): Dr. Clyda Greener  Supervising Physician: Michaelle Birks  Patient Status:  [facility] - In-pt  Chief Complaint:  Pelvic abscess. IR placed a pelvic abscess drain via a left transgluteal approach on 8.23.23 by Dr. Earleen Newport  Subjective:  Patient seen at bedside with husband. Patient quiet. Appears tired. Husband asking questions about the possibility of a colonostomy. Husband deferred to general surgery    Allergies: Codeine, Epinephrine (anaphylaxis), Orange fruit [citrus], Streptogramins, and Tomato  Medications: Prior to Admission medications   Medication Sig Start Date End Date Taking? Authorizing Provider  acetaminophen (TYLENOL) 325 MG tablet Take 650 mg by mouth every 6 (six) hours as needed for moderate pain.   Yes [provider]  capecitabine (XELODA) 500 MG tablet TAKE 2 TABLETS EVERY 12 HOURS  FOR 14 DAYS ON THEN 7 DAYS OFF, TAKE WITH MEALS Patient taking differently: Take 1,000 mg/m2 by mouth 2 (two) times daily after a meal. 03/24/22  Yes Truitt Merle, MD  cholecalciferol (VITAMIN D3) 25 MCG (1000 UNIT) tablet Take 1,000 Units by mouth daily.   Yes [provider]  ELIQUIS 5 MG TABS tablet TAKE 1 TABLET BY MOUTH TWICE A DAY Patient taking differently: Take 5 mg by mouth 2 (two) times daily. 03/24/22  Yes Truitt Merle, MD  KLOR-CON M20 20 MEQ tablet TAKE 1 TABLET BY MOUTH TWICE A DAY Patient taking differently: Take 20 mEq by mouth daily. 12/13/21  Yes Truitt Merle, MD  loperamide (IMODIUM) 2 MG capsule Take 1 capsule (2 mg total) by mouth as needed for diarrhea or loose stools. Patient taking differently: Take 2 mg by mouth daily as needed for diarrhea or loose stools. 05/27/21  Yes Nicole Kindred A, DO  ondansetron (ZOFRAN) 8 MG tablet Take 1 tablet (8 mg total) by mouth 2 (two) times daily as needed for refractory nausea / vomiting. Start on day 3 after chemotherapy. Patient taking differently: Take 8 mg by mouth 2 (two) times daily as needed for  refractory nausea / vomiting. 03/06/21  Yes Truitt Merle, MD  diphenoxylate-atropine (LOMOTIL) 2.5-0.025 MG tablet Take 2 tablets by mouth 4 (four) times daily as needed for diarrhea or loose stools. Patient not taking: Reported on 04/03/2022 01/06/22   Truitt Merle, MD     Vital Signs: BP 131/70 (BP Location: Left Wrist)   Pulse (!) 113   Temp 97.8 F (36.6 C) (Oral)   Resp 18   Ht '5\' 4"'$  (1.626 m)   Wt 138 lb 3.7 oz (62.7 kg)   SpO2 98%   BMI 23.73 kg/m   Physical Exam Vitals and nursing note reviewed.  Constitutional:      Appearance: She is well-developed. She is ill-appearing.  HENT:     Head: Normocephalic and atraumatic.  Eyes:     Conjunctiva/sclera: Conjunctivae normal.  Pulmonary:     Comments: On o2 Abdominal:     Comments: Positive left pelvic drain via a left transgluteal approach. Srain to  suction. Site is unremarkable with no erythema, edema, tenderness, bleeding or drainage noted at exit site. Suture and stat lock in place. Dressing is clean dry and intact. Drain just flushed and emptied prior to arrival. Yellow/tan output noted to be in the line of the JP drain. Per RN at bedside drain is able to be flushed easily.    Musculoskeletal:        General: Normal range of motion.     Cervical back: Normal range of motion.  Skin:    General:  Skin is warm.  Neurological:     Mental Status: She is alert and oriented to person, place, and time.     Imaging: DG Abd 1 View  Result Date: 04/14/2022 CLINICAL DATA:  Check gastric catheter placement EXAM: ABDOMEN - 1 VIEW COMPARISON:  Film from the previous day. FINDINGS: Gastric catheter is again coiled within the stomach. Scattered large and small bowel gas is noted with persistent small bowel dilatation. Pigtail catheter is noted in the pelvis consistent with recent pelvic abscess drainage IMPRESSION: Gastric catheter within the stomach. Stable gaseous distension of the large and small bowel. Electronically Signed   By: Inez Catalina M.D.   On: 04/14/2022 02:52   [place] EKG SITE RITE  Result Date: 04/13/2022 If [facility] image not attached, placement could not be confirmed due to current cardiac rhythm.  DG Abd 1 View  Result Date: 04/13/2022 CLINICAL DATA:  Evaluate NG tube EXAM: ABDOMEN - 1 VIEW COMPARISON:  April 12, 2022 FINDINGS: The NG tube is within the stomach. Air-filled dilated loops of large and small bowel again identified. IMPRESSION: The NG tube terminates in the stomach. Air-filled dilated loops of large and small bowel are again identified. The findings are most suggestive ileus. Electronically Signed   By: Dorise Bullion III M.D.   On: 04/13/2022 13:46   DG Abd 1 View  Result Date: 04/12/2022 CLINICAL DATA:  NG tube placement EXAM: ABDOMEN - 1 VIEW COMPARISON:  Study done earlier today FINDINGS: There is interval placement of enteric tube with its tip in the medial aspect of fundus of the stomach. Dilated bowel loops are seen in upper abdomen. There are linear densities in the lower lung fields suggesting possible subsegmental atelectasis. IMPRESSION: Tip of enteric tube is seen in the stomach. Dilation of bowel loops may be due to ileus or partial obstruction. Linear densities are seen in both lower lung fields suggesting atelectasis. Electronically Signed   By: Elmer Picker M.D.   On: 04/12/2022 15:13   DG Abd Portable 1V  Result Date: 04/12/2022 CLINICAL DATA:  Ileus EXAM: PORTABLE ABDOMEN - 1 VIEW COMPARISON:  04/10/2022 FINDINGS: Interval placement of percutaneous pigtail drainage catheter within the low left pelvis. Prominent air distended loops of large and small bowel throughout the abdomen. No gross free intraperitoneal air. No acute bony findings. IMPRESSION: Prominent air distended loops of large and small bowel throughout the abdomen, most suggestive of ileus. Distal obstruction not excluded. Continued radiographic follow-up recommended. Electronically Signed   By: Davina Poke D.O.    On: 04/12/2022 09:59    Labs:  CBC: Recent Labs    04/12/22 0511 04/13/22 0420 04/14/22 0403 04/15/22 0325  WBC 15.4* 19.3* 11.9* 10.3  HGB 9.9* 10.4* 9.7* 9.5*  HCT 28.6* 30.4* 29.0* 28.8*  PLT 110* 166 132* 102*    COAGS: Recent Labs    05/18/21 2044 05/19/21 0618 04/12/22 0511 04/13/22 0420 04/14/22 0403 04/15/22 0325  INR 1.3*   < > 2.1* 1.8* 2.1* 2.0*  APTT 24  --   --   --   --   --    < > = values in this interval not displayed.    BMP: Recent Labs    04/12/22 0511 04/13/22 0420 04/14/22 0403 04/15/22 0325  NA 139 141 144 142  K 3.0* 3.3* 4.0 3.2*  CL 112* 113* 117* 116*  CO2 22 20* 22 23  GLUCOSE 105* 98 112* 162*  BUN '10 12 16 15  '$ CALCIUM 8.6* 8.7*  8.5* 8.2*  CREATININE 0.40* 0.39* 0.41* 0.41*  GFRNONAA >60 >60 >60 >60    LIVER FUNCTION TESTS: Recent Labs    04/12/22 0511 04/13/22 0420 04/14/22 0403 04/15/22 0325  BILITOT 2.3* 2.2* 1.8* 1.7*  AST '19 20 17 18  '$ ALT '19 14 13 13  '$ ALKPHOS 89 83 72 66  PROT 5.3* 5.7* 5.4* 5.3*  ALBUMIN 1.8* 1.9* 1.9* 1.8*    Assessment and Plan:  61 y.o. female inpatient. History of metastatic rectosigmoid adenocarcinoma. Presented to the ED on 8.17.23 with abdominal pain, AMS, lethargy and diarrhea. Found to have colitis with urinary retention and a pelvic abscess. IR placed a left sided pelvic drain via a left transgluteal approach on 8.23.23.   Drain Location: left transgluteal pelvic abscess drain Size: Fr size: 10 Fr Date of placement: 8.23.23  Currently to: Drain collection device: suction bulb 24 hour output:  Output by Drain (mL) 04/13/22 0700 - 04/13/22 1459 04/13/22 1500 - 04/13/22 2259 04/13/22 2300 - 04/14/22 0659 04/14/22 0700 - 04/14/22 1459 04/14/22 1500 - 04/14/22 2259 04/14/22 2300 - 04/15/22 0659 04/15/22 0700 - 04/15/22 1405  Open Drain 1 Left Buttock 10 Fr.  '10 10    30  '$ Cultures grew e. Coli, pseudomonas aeruginosa, enterococcus faecalis. WBC WNL.   Interval imaging/drain  manipulation:  None. G tube placement confirmation x ray on 8.28.23  Current examination: Flushes/aspirates easily.  Insertion site unremarkable. Suture and stat lock in place. Dressed appropriately.   Plan: Continue TID flushes with 5 cc NS. Record output Q shift. Dressing changes QD or PRN if soiled.  Call IR APP or on call IR MD if difficulty flushing or sudden change in drain output.  Repeat imaging/possible drain injection once output < 10 mL/QD (excluding flush material). Consideration for drain removal if output is < 10 mL/QD (excluding flush material), pending discussion with the providing surgical service.  Discharge planning: Please contact IR APP or on call IR MD prior to patient d/c to ensure appropriate follow up plans are in place. Typically patient will follow up with IR clinic 10-14 days post d/c for repeat imaging/possible drain injection. IR scheduler will contact patient with date/time of appointment. Patient will need to flush drain QD with 5 cc NS, record output QD, dressing changes every 2-3 days or earlier if soiled.   IR will continue to follow - please call with questions or concerns.  Electronically Signed: Jacqualine Mau, NP 04/15/2022, 1:59 PM   I spent a total of 15 Minutes at the patient's bedside AND on the patient's hospital floor or unit, greater than 50% of which was counseling/coordinating care for pelvic abscess.

## 2022-04-15 NOTE — Progress Notes (Signed)
Resuming care of pt, pt resting in bed.

## 2022-04-15 NOTE — Progress Notes (Signed)
Occupational Therapy Treatment Patient Details Name: Angela Mcpherson MRN: [id number] DOB: 1961-02-18 Today's Date: 04/15/2022   History of present illness Patient 61 y.o. female presented 04/03/2022 with abdominal pain, confusion, lethargy, and loose stools.  She underwent CT imaging in the emergency department which showed wall thickening of the colon consistent with colitis.  She also had a distended urinary bladder consistent with urinary retention.  C. difficile and GI pathogen panel testing were both negative.  Her blood cultures have grown E. coli and she is currently on ceftriaxone.  She has been afebrile since 8/19 at 8 PM on current antibiotics. 04/09/22 drain placed for pelvic abscess. 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   OT comments  Pt supine in bed and spouse present, agreeable to OT session.  Engaged in bed mobility with min-mod assist, sitting EOB briefly with min guard before reporting fatigued and needing to lay back down.  She worked on combing hair from bed level. She requires cueing for attention and problem solving during session. OT will continue to follow, pending progress may need follow up therapy.    Recommendations for follow up therapy are one component of a multi-disciplinary discharge planning process, led by the attending physician.  Recommendations may be updated based on patient status, additional functional criteria and insurance authorization.    Follow Up Recommendations  No OT follow up    Assistance Recommended at Discharge Intermittent Supervision/Assistance  Patient can return home with the following  A little help with walking and/or transfers;A little help with bathing/dressing/bathroom;Assistance with cooking/housework;Help with stairs or ramp for entrance   Equipment Recommendations  BSC/3in1    Recommendations for Other Services      Precautions / Restrictions  Precautions Precautions: Fall Precaution Comments: L gluteal drain, NG tube Restrictions Weight Bearing Restrictions: No       Mobility Bed Mobility Overal bed mobility: Needs Assistance Bed Mobility: Supine to Sit, Sit to Supine     Supine to sit: Min assist Sit to supine: Mod assist   General bed mobility comments: min assist to elevated trunk to EOB, mod assist to control trunk and reposition in bed    Transfers                   General transfer comment: pt declined due to fatigue     Balance Overall balance assessment: Needs assistance Sitting-balance support: No upper extremity supported, Feet supported Sitting balance-Leahy Scale: Fair Sitting balance - Comments: limited tolerance, min guard for safety                                   ADL either performed or assessed with clinical judgement   ADL Overall ADL's : Needs assistance/impaired Eating/Feeding: NPO Eating/Feeding Details (indicate cue type and reason): NG tube             Upper Body Dressing : Min guard;Sitting   Lower Body Dressing: Sitting/lateral leans;Moderate assistance     Toilet Transfer Details (indicate cue type and reason): deferred         Functional mobility during ADLs: Minimal assistance General ADL Comments: limited to EOB, pt fatigues quickly and reports "I need to lay back down"    Extremity/Trunk Assessment Upper Extremity Assessment Upper Extremity Assessment: Generalized weakness            Vision       Perception  Praxis      Cognition Arousal/Alertness: Awake/alert Behavior During Therapy: WFL for tasks assessed/performed Overall Cognitive Status: Impaired/Different from baseline Area of Impairment: Attention, Following commands, Problem solving, Awareness                   Current Attention Level: Sustained   Following Commands: Follows one step commands consistently, Follows one step commands with increased time,  Follows multi-step commands inconsistently   Awareness: Emergent Problem Solving: Slow processing, Requires verbal cues, Difficulty sequencing General Comments: pt oriented and follows simple commands with cueing for attention and problem sovling.  Increased time required.        Exercises      Shoulder Instructions       General Comments spouse present and supportive    Pertinent Vitals/ Pain       Pain Assessment Pain Assessment: Faces Faces Pain Scale: Hurts even more Pain Location: stomach, L gluteal area Pain Descriptors / Indicators: Sore Pain Intervention(s): Limited activity within patient's tolerance, Monitored during session, Repositioned  Home Living                                          Prior Functioning/Environment              Frequency  Min 2X/week        Progress Toward Goals  OT Goals(current goals can now be found in the care plan section)  Progress towards OT goals: OT to reassess next treatment  Acute Rehab OT Goals Time For Goal Achievement: 04/20/22 Potential to Achieve Goals: Good  Plan Discharge plan remains appropriate;Frequency remains appropriate    Co-evaluation                 AM-PAC OT "6 Clicks" Daily Activity     Outcome Measure   Help from another person eating meals?: Total (NPO) Help from another person taking care of personal grooming?: A Little Help from another person toileting, which includes using toliet, bedpan, or urinal?: A Lot Help from another person bathing (including washing, rinsing, drying)?: A Lot Help from another person to put on and taking off regular upper body clothing?: A Little Help from another person to put on and taking off regular lower body clothing?: A Lot 6 Click Score: 13    End of Session    OT Visit Diagnosis: Unsteadiness on feet (R26.81);Other abnormalities of gait and mobility (R26.89);Muscle weakness (generalized) (M62.81)   Activity Tolerance  Patient limited by fatigue   Patient Left in bed;with call bell/phone within reach;with bed alarm set;with family/visitor present   Nurse Communication Mobility status        Time: 6568-1275 OT Time Calculation (min): 16 min  Charges: OT General Charges $OT Visit: 1 Visit OT Treatments $Self Care/Home Management : 8-22 mins  Jolaine Artist, OT Acute Rehabilitation Services Office [phone]   Delight Stare 04/15/2022, 10:55 AM

## 2022-04-16 ENCOUNTER — Inpatient Hospital Stay (HOSPITAL_COMMUNITY): Payer: 59

## 2022-04-16 ENCOUNTER — Encounter (HOSPITAL_COMMUNITY): Payer: Self-pay | Admitting: Internal Medicine

## 2022-04-16 DIAGNOSIS — G9341 Metabolic encephalopathy: Secondary | ICD-10-CM | POA: Diagnosis not present

## 2022-04-16 DIAGNOSIS — R7881 Bacteremia: Secondary | ICD-10-CM | POA: Diagnosis not present

## 2022-04-16 DIAGNOSIS — K7460 Unspecified cirrhosis of liver: Secondary | ICD-10-CM | POA: Diagnosis not present

## 2022-04-16 DIAGNOSIS — D689 Coagulation defect, unspecified: Secondary | ICD-10-CM | POA: Diagnosis not present

## 2022-04-16 LAB — CBC
HCT: 30.5 % — ABNORMAL LOW (ref 36.0–46.0)
Hemoglobin: 9.8 g/dL — ABNORMAL LOW (ref 12.0–15.0)
MCH: 34 pg (ref 26.0–34.0)
MCHC: 32.1 g/dL (ref 30.0–36.0)
MCV: 105.9 fL — ABNORMAL HIGH (ref 80.0–100.0)
Platelets: 87 10*3/uL — ABNORMAL LOW (ref 150–400)
RBC: 2.88 MIL/uL — ABNORMAL LOW (ref 3.87–5.11)
RDW: 25 % — ABNORMAL HIGH (ref 11.5–15.5)
WBC: 7.3 10*3/uL (ref 4.0–10.5)
nRBC: 0 % (ref 0.0–0.2)

## 2022-04-16 LAB — COMPREHENSIVE METABOLIC PANEL
ALT: 15 U/L (ref 0–44)
AST: 22 U/L (ref 15–41)
Albumin: 1.9 g/dL — ABNORMAL LOW (ref 3.5–5.0)
Alkaline Phosphatase: 71 U/L (ref 38–126)
Anion gap: 4 — ABNORMAL LOW (ref 5–15)
BUN: 15 mg/dL (ref 6–20)
CO2: 24 mmol/L (ref 22–32)
Calcium: 8.2 mg/dL — ABNORMAL LOW (ref 8.9–10.3)
Chloride: 111 mmol/L (ref 98–111)
Creatinine, Ser: 0.43 mg/dL — ABNORMAL LOW (ref 0.44–1.00)
GFR, Estimated: >60 mL/min (ref >60)
Glucose, Bld: 176 mg/dL — ABNORMAL HIGH (ref 70–99)
Potassium: 3.9 mmol/L (ref 3.5–5.1)
Sodium: 139 mmol/L (ref 135–145)
Total Bilirubin: 1.3 mg/dL — ABNORMAL HIGH (ref 0.3–1.2)
Total Protein: 5.5 g/dL — ABNORMAL LOW (ref 6.5–8.1)

## 2022-04-16 LAB — GLUCOSE, CAPILLARY
Glucose-Capillary: 133 mg/dL — ABNORMAL HIGH (ref 70–99)
Glucose-Capillary: 188 mg/dL — ABNORMAL HIGH (ref 70–99)
Glucose-Capillary: 179 mg/dL — ABNORMAL HIGH (ref 70–99)

## 2022-04-16 LAB — MAGNESIUM: Magnesium: 1.8 mg/dL (ref 1.7–2.4)

## 2022-04-16 LAB — PROTIME-INR
INR: 1.7 — ABNORMAL HIGH (ref 0.8–1.2)
Prothrombin Time: 19.7 seconds — ABNORMAL HIGH (ref 11.4–15.2)

## 2022-04-16 LAB — PHOSPHORUS: Phosphorus: 3.1 mg/dL (ref 2.5–4.6)

## 2022-04-16 LAB — AMMONIA: Ammonia: 26 umol/L (ref 9–35)

## 2022-04-16 MED ORDER — BISACODYL 10 MG RE SUPP
10.0000 mg | Freq: Once | RECTAL | Status: AC
  Administered 2022-04-16: 10 mg via RECTAL
  Filled 2022-04-16: qty 1

## 2022-04-16 MED ORDER — SODIUM CHLORIDE (PF) 0.9 % IJ SOLN
INTRAMUSCULAR | Status: AC
  Filled 2022-04-16: qty 50

## 2022-04-16 MED ORDER — HYDROCORTISONE ACETATE 25 MG RE SUPP
25.0000 mg | Freq: Two times a day (BID) | RECTAL | Status: DC
  Administered 2022-04-16 – 2022-04-24 (×15): 25 mg via RECTAL
  Filled 2022-04-16 (×19): qty 1

## 2022-04-16 MED ORDER — SODIUM CHLORIDE 0.9 % IV SOLN: INTRAVENOUS | Status: DC

## 2022-04-16 MED ORDER — IOHEXOL 300 MG/ML  SOLN
100.0000 mL | Freq: Once | INTRAMUSCULAR | Status: AC | PRN
  Administered 2022-04-17: 100 mL via INTRAVENOUS

## 2022-04-16 MED ORDER — TRAVASOL 10 % IV SOLN
INTRAVENOUS | Status: AC
  Filled 2022-04-16: qty 900

## 2022-04-16 MED ORDER — INSULIN ASPART 100 UNIT/ML IJ SOLN
0.0000 [IU] | Freq: Four times a day (QID) | INTRAMUSCULAR | Status: DC
  Administered 2022-04-16 – 2022-04-17 (×5): 2 [IU] via SUBCUTANEOUS
  Administered 2022-04-17: 3 [IU] via SUBCUTANEOUS
  Administered 2022-04-18: 2 [IU] via SUBCUTANEOUS
  Administered 2022-04-18: 1 [IU] via SUBCUTANEOUS

## 2022-04-16 NOTE — TOC Progression Note (Signed)
Transition of Care [facility]) - Progression Note    Patient Details  Name: Angela Mcpherson MRN: [id number] Date of Birth: May 08, 1961  Transition of [facility]) CM/SW Contact  Roseanne Kaufman, RN Phone Number: 04/16/2022, 3:13 PM  Clinical Narrative:   Per chart review, awaiting patient's husband to make decision on options, per palliative team. TOC will continue follow.    Expected Discharge Plan: [place] Barriers to Discharge: No Barriers Identified  Expected Discharge Plan and Services Expected Discharge Plan: [place] In-house Referral: NA Discharge Planning Services: CM Consult Post Acute Care Choice: Home Health, Durable Medical Equipment Living arrangements for the past 2 months: Single Family Home                 DME Arranged: N/A DME Agency: NA       HH Arranged: NA HH Agency: NA         Social Determinants of Health (SDOH) Interventions    Readmission Risk Interventions    04/16/2022    2:57 PM 04/11/2022   11:52 AM  Readmission Risk Prevention Plan  Transportation Screening Complete Complete  PCP or Specialist Appt within 3-5 Days  Complete  HRI or [place]  Complete  Social Work Consult for [place] Planning/Counseling  Complete  Palliative Care Screening  Not Applicable  Medication Review [employer]) Complete Complete  PCP or Specialist appointment within 3-5 days of discharge Complete   HRI or [place] Complete   SW Recovery Care/Counseling Consult Complete   Palliative Care Screening Complete   [place] Not Applicable

## 2022-04-16 NOTE — Progress Notes (Signed)
[facility] Progress Note  Patient: Angela Mcpherson     BZJ:[id number]  DOA: 04/03/2022   PCP: Default, Provider, MD       Brief hospital course:   This is a 61 year old female with rectal cancer that is metastatic to the liver and lung, hep C cirrhosis, aortic mural thrombus who presented to the hospital with abdominal pain and confusion.  Initially she was found to have colitis. She was started on IV antibiotics.  Blood cultures ultimately grew E. coli and despite appropriate antibiotics WBC count continued to rise. 03/2019 CT revealed left-sided pelvic abscess On 8/23 she underwent left transgluteal drain placement for the pelvic abscess 8/24 she had increasing abdominal pain with an x-ray suggestive of an 8/26 NG tube was placed but the patient removed it the following day it was replaced a couple more times but the patient again removed this requiring it to be replaced a fourth time 8/29-started on steroids and octreotide  Subjective:  Has some lower abdominal pain.  No nausea.  No other complaints  Assessment and Plan: Principal Problem: Severe Sepsis-   E coli and citrobacter bacteremia -Currently receiving Zosyn - Appreciate ID consult Active Problems:  Metastatic colorectal cancer with pelvic abscess suspected to be secondary to the cancer -Continue drain and palliative treatments to help resolve obstruction -Currently on TPN and n.p.o. -Palliative care recommending radiation oncology consult as the patient and her husband are hesitant to undergo a palliative surgery    Acute metabolic encephalopathy -Her husband feels that she is much more alert today-she still appears disoriented on my evaluation    Aortic mural thrombus (HCC)   Thrombocytopenia (HCC) Elevated INR -Eliquis on hold    Hepatitis C    Acute urinary retention -Has resolved  DVT prophylaxis:  Place and maintain sequential compression device Start: 04/04/22 0946     Code Status: DNR   Consultants: General surgery, palliative care Level of Care: Level of care: Med-Surg Disposition Plan:  Status is: Inpatient Remains inpatient appropriate because: Ongoing active IV treatments  Objective:   Vitals:   04/15/22 0545 04/15/22 1223 04/16/22 0306 04/16/22 1254  BP: (!) 146/93 131/70 (!) 137/91 118/69  Pulse: (!) 117 (!) 113 98 91  Resp: '20 18 18 18  '$ Temp: (!) 97.5 F (36.4 C) 97.8 F (36.6 C) 97.7 F (36.5 C) 97.9 F (36.6 C)  TempSrc:  Oral Oral Oral  SpO2: 96% 98% 94% 95%  Weight:      Height:       Filed Weights   04/04/22 0847 04/10/22 1247  Weight: 49.3 kg 62.7 kg   Exam: General exam: Appears comfortable  HEENT:  oral mucosa dry, no sclera icterus or thrush Respiratory system: Clear to auscultation. Respiratory effort normal. Cardiovascular system: S1 & S2 heard, regular rate and rhythm Gastrointestinal system: Abdomen soft, distended, tympanic, NG tube present-drain present in left buttock Central nervous system: Alert and oriented. No focal neurological deficits. Extremities: Has anasarca Skin: No rashes or ulcers Psychiatry:  Mood & affect appropriate.    Imaging and lab data was personally reviewed    CBC: Recent Labs  Lab 04/12/22 0511 04/13/22 0420 04/14/22 0403 04/15/22 0325 04/16/22 0357  WBC 15.4* 19.3* 11.9* 10.3 7.3  HGB 9.9* 10.4* 9.7* 9.5* 9.8*  HCT 28.6* 30.4* 29.0* 28.8* 30.5*  MCV 98.3 98.4 100.7* 102.9* 105.9*  PLT 110* 166 132* 102* 87*   Basic Metabolic Panel: Recent Labs  Lab 04/10/22 0410 04/11/22 0426 04/12/22 0511 04/13/22 0420 04/14/22 0403  04/15/22 0325 04/16/22 0357  NA 136   < > 139 141 144 142 139  K 2.8*   < > 3.0* 3.3* 4.0 3.2* 3.9  CL 115*   < > 112* 113* 117* 116* 111  CO2 18*   < > 22 20* '22 23 24  '$ GLUCOSE 120*   < > 105* 98 112* 162* 176*  BUN 10   < > '10 12 16 15 15  '$ CREATININE 0.49   < > 0.40* 0.39* 0.41* 0.41* 0.43*  CALCIUM 7.7*   < > 8.6* 8.7* 8.5* 8.2* 8.2*  MG 1.5*  --   --   --  1.7  1.7 1.8  PHOS  --   --   --   --  2.8 2.0* 3.1   < > = values in this interval not displayed.   GFR: Estimated Creatinine Clearance: 64.6 mL/min (A) (by C-G formula based on SCr of 0.43 mg/dL (L)).  Scheduled Meds:  Chlorhexidine Gluconate Cloth  6 each Topical Q0600   dexamethasone (DECADRON) injection  8 mg Intravenous Daily   hydrocortisone  25 mg Rectal BID   insulin aspart  0-9 Units Subcutaneous Q6H   Continuous Infusions:  sodium chloride 10 mL/hr at 04/16/22 1628   octreotide (SANDOSTATIN) 500 mcg in sodium chloride 0.9 % 250 mL (2 mcg/mL) infusion 50 mcg/hr (04/16/22 1425)   piperacillin-tazobactam (ZOSYN)  IV 3.375 g (04/16/22 1630)   TPN ADULT (ION) 75 mL/hr at 04/16/22 1746     LOS: 13 days   Author: Debbe Odea  04/16/2022 6:16 PM

## 2022-04-16 NOTE — Progress Notes (Signed)
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  Admitted with altered mental status and was found to be in septic shock from intra-abdominal abscess with blood cultures positive for E. Coli.  She currently has an NG tube placed for gastric and small intestinal decompression due to a combination of ileus and probable metastatic obstruction at the rectosigmoid junction.  He continues to have significant delirium and is unable to participate in any conversations regarding her own goals of care however her husband Zenia Resides is at the bedside and is supportive and helping to make decisions for her.  Prior to this admission and her bowel perforation she had a high functional status and reported good quality of life.  Follow-up today guarding her goals of care as well as pain and symptom management.  Patient's spouse is still in the process of considering options and asking questions about what is possible to help his wife regain some quality of life and manage her cancer related complications.  She has now been hospitalized for 12 days, she is on TPN and unable to sustain her nutrition persistent altered mental status.  General surgery has advised against an aggressive surgery to remove the rectosigmoid mass and abscess.  She has a percutaneous drain placed.  Recommendations: Initiate a trial of steroids and octreotide for metastatic bowel obstruction. Readdress her goals of care based on response to the steroids, octreotide and depending on whether her obstruction resolves or not.  At this time she would not be a good candidate for surgery or chemotherapy to treat her cancer.  She has widespread involvement including her lymph nodes and lungs and an overall very poor prognosis.   Lane Hacker, DO Palliative Medicine  Time: 35 min

## 2022-04-16 NOTE — Progress Notes (Signed)
PT Cancellation Note  Patient Details Name: Angela Mcpherson MRN: [id number] DOB: 1961-04-15   Cancelled Treatment:    Reason Eval/Treat Not Completed: Other (comment) Per RN, pt has palliative meeting pending today.  Will await GOC and palliative discussion.  Please discontinue PT if appropriate.   Myrtis Hopping [place] 04/16/2022, 12:50 PM Jannette Spanner PT, DPT Physical Therapist Acute Rehabilitation Services Preferred contact method: Secure Chat Weekend Pager Only: [phone] Office: [phone]

## 2022-04-16 NOTE — Progress Notes (Signed)
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  Admitted with altered mental status and was found to be in septic shock from intra-abdominal abscess with blood cultures positive for E. Coli.  She currently has an NG tube placed for gastric and small intestinal decompression due to a combination of ileus and probable metastatic obstruction at the rectosigmoid junction.  He continues to have significant delirium and is unable to participate in any conversations regarding her own goals of care however her husband Zenia Resides is at the bedside and is supportive and helping to make decisions for her.  Prior to this admission and her bowel perforation she had a high functional status and reported good quality of life.  Follow-up today guarding her goals of care as well as pain and symptom management.  Patient's spouse is still in the process of considering options and asking questions about what is possible to help his wife regain some quality of life and manage her cancer related complications.  She has now been hospitalized for 12 days, she is on TPN and unable to sustain her nutrition persistent altered mental status.  General surgery has advised against an aggressive surgery to remove the rectosigmoid mass and abscess.  She has a percutaneous drain placed.  Recommendations: Goals discussion today - now DNR order in place but desires full scope medical interventions except in event she is not breathing or pulseless. Agrees to Venting PEG Continue Steroids Increase Octreotide to 50 Dulcolax HC suppository Recommend radiation oncology evaluation.  Lane Hacker, DO Palliative Medicine  Time: 35 min

## 2022-04-16 NOTE — Progress Notes (Signed)
Subjective/Chief Complaint: Patient awake, alert Husband at bedside No flatus or BM NG 650 cc output Pelvic drain - 40 cc output   Objective: Vital signs in last 24 hours: Temp:  [97.7 F (36.5 C)-97.8 F (36.6 C)] 97.7 F (36.5 C) (08/30 0306) Pulse Rate:  [98-113] 98 (08/30 0306) Resp:  [18] 18 (08/30 0306) BP: (131-137)/(70-91) 137/91 (08/30 0306) SpO2:  [94 %-98 %] 94 % (08/30 0306) Last BM Date : 04/11/22  Intake/Output from previous day: 08/29 0701 - 08/30 0700 In: 2618.6 [P.O.:75; I.V.:1858.3; IV Piggyback:685.3] Out: 990 [Urine:300; Emesis/NG output:650; Drains:40] Intake/Output this shift: No intake/output data recorded.  Alert, interactive Abd - distended, mildly tender  Lab Results:  Recent Labs    04/15/22 0325 04/16/22 0357  WBC 10.3 7.3  HGB 9.5* 9.8*  HCT 28.8* 30.5*  PLT 102* 87*   BMET Recent Labs    04/15/22 0325 04/16/22 0357  NA 142 139  K 3.2* 3.9  CL 116* 111  CO2 23 24  GLUCOSE 162* 176*  BUN 15 15  CREATININE 0.41* 0.43*  CALCIUM 8.2* 8.2*   PT/INR Recent Labs    04/15/22 0325 04/16/22 0357  LABPROT 22.1* 19.7*  INR 2.0* 1.7*   ABG No results for input(s): "PHART", "HCO3" in the last 72 hours.  Invalid input(s): "PCO2", "PO2"  Studies/Results: No results found.  Anti-infectives: Anti-infectives (From admission, onward)    Start     Dose/Rate Route Frequency Ordered Stop   04/14/22 1800  piperacillin-tazobactam (ZOSYN) IVPB 3.375 g        3.375 g 12.5 mL/hr over 240 Minutes Intravenous Every 8 hours 04/14/22 1224     04/08/22 2100  ceFEPIme (MAXIPIME) 2 g in sodium chloride 0.9 % 100 mL IVPB  Status:  Discontinued        2 g 200 mL/hr over 30 Minutes Intravenous Every 12 hours 04/08/22 1453 04/14/22 1224   04/08/22 1600  metroNIDAZOLE (FLAGYL) IVPB 500 mg  Status:  Discontinued        500 mg 100 mL/hr over 60 Minutes Intravenous Every 12 hours 04/08/22 1453 04/14/22 1224   04/08/22 1515   piperacillin-tazobactam (ZOSYN) IVPB 3.375 g  Status:  Discontinued        3.375 g 12.5 mL/hr over 240 Minutes Intravenous Every 8 hours 04/08/22 1428 04/08/22 1447   04/07/22 2000  ceFEPIme (MAXIPIME) 2 g in sodium chloride 0.9 % 100 mL IVPB  Status:  Discontinued        2 g 200 mL/hr over 30 Minutes Intravenous Every 12 hours 04/07/22 1600 04/08/22 1428   04/04/22 0900  cefTRIAXone (ROCEPHIN) 2 g in sodium chloride 0.9 % 100 mL IVPB  Status:  Discontinued        2 g 200 mL/hr over 30 Minutes Intravenous Every 24 hours 04/04/22 0257 04/07/22 1600   04/03/22 1700  ceFEPIme (MAXIPIME) 2 g in sodium chloride 0.9 % 100 mL IVPB  Status:  Discontinued        2 g 200 mL/hr over 30 Minutes Intravenous Every 8 hours 04/03/22 1634 04/04/22 0257   04/03/22 1500  metroNIDAZOLE (FLAGYL) IVPB 500 mg  Status:  Discontinued        500 mg 100 mL/hr over 60 Minutes Intravenous Every 12 hours 04/03/22 1446 04/04/22 0257   04/03/22 1045  ceFEPIme (MAXIPIME) 2 g in sodium chloride 0.9 % 100 mL IVPB        2 g 200 mL/hr over 30 Minutes Intravenous  Once 04/03/22  1031 04/03/22 1429   04/03/22 1045  vancomycin (VANCOCIN) IVPB 1000 mg/200 mL premix        1,000 mg 200 mL/hr over 60 Minutes Intravenous  Once 04/03/22 1031 04/03/22 1241       Assessment/Plan: Expand All Collapse All       Subjective/Chief Complaint: Patient reports no flatus NG tube functioning - still feels nauseated TPN has been started   Objective: Vital signs in last 24 hours: Temp:  [97.5 F (36.4 C)-98.4 F (36.9 C)] 97.5 F (36.4 C) (08/29 0545) Pulse Rate:  [116-121] 117 (08/29 0545) Resp:  [20-25] 20 (08/29 0545) BP: (125-146)/(79-93) 146/93 (08/29 0545) SpO2:  [95 %-99 %] 96 % (08/29 0545) Last BM Date : 04/11/22   Intake/Output from previous day: 08/28 0701 - 08/29 0700 In: 2034.3 [I.V.:1822.8; IV Piggyback:211.5] Out: 1325 [Urine:800; Emesis/NG output:525] Intake/Output this shift: No intake/output data  recorded.   Alert, interactive Abd - distended, mildly tender   Lab Results:  Recent Labs (last 2 labs)      Recent Labs    04/14/22 0403 04/15/22 0325  WBC 11.9* 10.3  HGB 9.7* 9.5*  HCT 29.0* 28.8*  PLT 132* 102*      BMET Recent Labs (last 2 labs)      Recent Labs    04/14/22 0403 04/15/22 0325  NA 144 142  K 4.0 3.2*  CL 117* 116*  CO2 22 23  GLUCOSE 112* 162*  BUN 16 15  CREATININE 0.41* 0.41*  CALCIUM 8.5* 8.2*      PT/INR Recent Labs (last 2 labs)      Recent Labs    04/14/22 0403 04/15/22 0325  LABPROT 23.0* 22.1*  INR 2.1* 2.0*      ABG  Recent Labs (last 2 labs)  No results for input(s): "PHART", "HCO3" in the last 72 hours.   Invalid input(s): "PCO2", "PO2"     Studies/Results:  Imaging Results (Last 48 hours)  DG Abd 1 View   Result Date: 04/14/2022 CLINICAL DATA:  Check gastric catheter placement EXAM: ABDOMEN - 1 VIEW COMPARISON:  Film from the previous day. FINDINGS: Gastric catheter is again coiled within the stomach. Scattered large and small bowel gas is noted with persistent small bowel dilatation. Pigtail catheter is noted in the pelvis consistent with recent pelvic abscess drainage IMPRESSION: Gastric catheter within the stomach. Stable gaseous distension of the large and small bowel. Electronically Signed   By: Inez Catalina M.D.   On: 04/14/2022 02:52    [place] EKG SITE RITE   Result Date: 04/13/2022 If [facility] image not attached, placement could not be confirmed due to current cardiac rhythm.   DG Abd 1 View   Result Date: 04/13/2022 CLINICAL DATA:  Evaluate NG tube EXAM: ABDOMEN - 1 VIEW COMPARISON:  April 12, 2022 FINDINGS: The NG tube is within the stomach. Air-filled dilated loops of large and small bowel again identified. IMPRESSION: The NG tube terminates in the stomach. Air-filled dilated loops of large and small bowel are again identified. The findings are most suggestive ileus. Electronically Signed   By: Dorise Bullion III M.D.   On: 04/13/2022 13:46      Anti-infectives: Anti-infectives (From admission, onward)        Start     Dose/Rate Route Frequency Ordered Stop    04/14/22 1800   piperacillin-tazobactam (ZOSYN) IVPB 3.375 g        3.375 g 12.5 mL/hr over 240 Minutes Intravenous Every 8 hours 04/14/22  1224      04/08/22 2100   ceFEPIme (MAXIPIME) 2 g in sodium chloride 0.9 % 100 mL IVPB  Status:  Discontinued        2 g 200 mL/hr over 30 Minutes Intravenous Every 12 hours 04/08/22 1453 04/14/22 1224    04/08/22 1600   metroNIDAZOLE (FLAGYL) IVPB 500 mg  Status:  Discontinued        500 mg 100 mL/hr over 60 Minutes Intravenous Every 12 hours 04/08/22 1453 04/14/22 1224    04/08/22 1515   piperacillin-tazobactam (ZOSYN) IVPB 3.375 g  Status:  Discontinued        3.375 g 12.5 mL/hr over 240 Minutes Intravenous Every 8 hours 04/08/22 1428 04/08/22 1447    04/07/22 2000   ceFEPIme (MAXIPIME) 2 g in sodium chloride 0.9 % 100 mL IVPB  Status:  Discontinued        2 g 200 mL/hr over 30 Minutes Intravenous Every 12 hours 04/07/22 1600 04/08/22 1428    04/04/22 0900   cefTRIAXone (ROCEPHIN) 2 g in sodium chloride 0.9 % 100 mL IVPB  Status:  Discontinued        2 g 200 mL/hr over 30 Minutes Intravenous Every 24 hours 04/04/22 0257 04/07/22 1600    04/03/22 1700   ceFEPIme (MAXIPIME) 2 g in sodium chloride 0.9 % 100 mL IVPB  Status:  Discontinued        2 g 200 mL/hr over 30 Minutes Intravenous Every 8 hours 04/03/22 1634 04/04/22 0257    04/03/22 1500   metroNIDAZOLE (FLAGYL) IVPB 500 mg  Status:  Discontinued        500 mg 100 mL/hr over 60 Minutes Intravenous Every 12 hours 04/03/22 1446 04/04/22 0257    04/03/22 1045   ceFEPIme (MAXIPIME) 2 g in sodium chloride 0.9 % 100 mL IVPB        2 g 200 mL/hr over 30 Minutes Intravenous  Once 04/03/22 1031 04/03/22 1429    04/03/22 1045   vancomycin (VANCOCIN) IVPB 1000 mg/200 mL premix        1,000 mg 200 mL/hr over 60 Minutes Intravenous  Once  04/03/22 1031 04/03/22 1241             Assessment/Plan: Metastatic colorectal cancer on chemotherapy Pelvic abscess concern for contained perforation at cancer - no indication for emergent surgical intervention - no peritonitis - any type of surgery will be higher risk due to her multiple ongoing comorbidities and poor nutritional status. - I spent some time with the patient and her husband discussing the possible palliative gastrostomy tube and loop colostomy tube, which might temporarily improve her remaining quality of life.  This might allow her to eat and obtain some nutritional benefit from food.  The gastrostomy tube could replace the NG tube and would be easier to manage at home.   - TPN - Continue NG to LIWS - s/p IR drain on 8/23, at some point will need f/u CT scan - cx E COLI pansensitive, report pending. WBC trending down, afebrile, ID following - INR elevated - will need FFP/ Vit K prior to any surgical intervention - Palliative involved - The family wants to try the non-surgical interventions suggested by Dr. Donovan Kail (decadron/ octreotide) first.  If no improvement, they are likely to consent to proceed with palliative surgery. - We will follow-up tomorrow.   FEN: NPO/NGT, IVFs ID: [email or web address] VTE: eliquis on hold   Delirium Coagulopathy Left hydronephrosis due to mass  LOS: 13 days    Angela Mcpherson 04/16/2022

## 2022-04-16 NOTE — Progress Notes (Signed)
PHARMACY - TOTAL PARENTERAL NUTRITION CONSULT NOTE   Indication: Prolonged ileus  Patient Measurements: Height: '5\' 4"'$  (162.6 cm) Weight: 62.7 kg (138 lb 3.7 oz) IBW/kg (Calculated) : 54.7 TPN AdjBW (KG): 49.3 Body mass index is 23.73 kg/m. Usual Weight:   Assessment:  61 y.o. 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  8/22 CT shows new left sided abscess, not present on admission; on 8/23, CCS and IR consulted and underwent perc drain placement. On 8/24, AXR showed dilated SB and NGT placed on 8/26.  Patient removed NGT 8/27 and unable to replace at this time. On 8/27, Pharmacy consulted to dose TPN.  Glucose / Insulin: No hx DM. Goal CBGs 100-150.  - CBG range 154-181 - 4 units sSSI required - Dexamethasone '8mg'$  IV daily x 10 days started 8/29 Electrolytes: K/Mg/Phos all improved after replacement yesterday, Cl elevated, Corr Cal 9.88 WNL. Renal: BUN/SCr WNL Hepatic: AST/ALT, AlkPhos WNL, Tbili elevated 1.3 but improving Intake / Output; urine 349m recorded (accuracy?), no stools; 650cc NGT output  MIVF: NS w/ 20KCl at 20/hr, octreotide drip at 12.526mhr started 8/29 GI Imaging: as above GI Surgeries / Procedures:  8/28: s/p L transgluteal drain placement by IR  Central access: PICC TPN start date: 8/28  Nutritional Goals: Goal TPN rate is 75 mL/hr (provides 90 g of protein and 1818 kcals per day)  RD Assessment: 8/24 Estimated Needs Total Energy Estimated Needs: 1700-2000 kcal Total Protein Estimated Needs: 85-95 grams Total Fluid Estimated Needs: >/= 2 L/day  Current Nutrition:  NPO TPN at 55 ml/hr  Plan:   Increase TPN to 7534mr at 1800 Electrolytes in TPN:  Na 82m77m,  K 82mE17m  Ca 5mEq/12m Mg 5mEq/L60mPhos 15mmol/42mCl:Ac 1:2 Add standard MVI and trace elements to TPN Continue Sensitive q6h SSI and adjust as needed   Add 100 mg Thiamine x 5 days given refeeding syndrome risk per RD recommendations (started 8/29) Reduce MIVF to KVO at [facility]  Monitor TPN labs on Mon/Thurs  Myonna Chisom WilPeggyann Juba, BCPS Pharmacy: [phone],7:12 AM

## 2022-04-16 NOTE — Progress Notes (Signed)
Referring Physician(s): Golding,E/ Toth,P  Supervising Physician: Jacqulynn Cadet  Patient Status:  [facility] - In-pt  Chief Complaint: Pelvic abscess s/p L transgluteal drain placement 8/23; metastatic colon cancer with ileus/bowel dilatation   Subjective: Pt still has some bowel distension/discomfort; NG in place; denies fever,HA,CP, cough, N/V or bleeding; c/o swollen LE's, some dyspnea; husband in room; mental state a little better today   Allergies: Codeine, Epinephrine (anaphylaxis), Orange fruit [citrus], Streptogramins, and Tomato  Medications: Prior to Admission medications   Medication Sig Start Date End Date Taking? Authorizing Provider  acetaminophen (TYLENOL) 325 MG tablet Take 650 mg by mouth every 6 (six) hours as needed for moderate pain.   Yes [provider]  capecitabine (XELODA) 500 MG tablet TAKE 2 TABLETS EVERY 12 HOURS  FOR 14 DAYS ON THEN 7 DAYS OFF, TAKE WITH MEALS Patient taking differently: Take 1,000 mg/m2 by mouth 2 (two) times daily after a meal. 03/24/22  Yes Truitt Merle, MD  cholecalciferol (VITAMIN D3) 25 MCG (1000 UNIT) tablet Take 1,000 Units by mouth daily.   Yes [provider]  ELIQUIS 5 MG TABS tablet TAKE 1 TABLET BY MOUTH TWICE A DAY Patient taking differently: Take 5 mg by mouth 2 (two) times daily. 03/24/22  Yes Truitt Merle, MD  KLOR-CON M20 20 MEQ tablet TAKE 1 TABLET BY MOUTH TWICE A DAY Patient taking differently: Take 20 mEq by mouth daily. 12/13/21  Yes Truitt Merle, MD  loperamide (IMODIUM) 2 MG capsule Take 1 capsule (2 mg total) by mouth as needed for diarrhea or loose stools. Patient taking differently: Take 2 mg by mouth daily as needed for diarrhea or loose stools. 05/27/21  Yes Nicole Kindred A, DO  ondansetron (ZOFRAN) 8 MG tablet Take 1 tablet (8 mg total) by mouth 2 (two) times daily as needed for refractory nausea / vomiting. Start on day 3 after chemotherapy. Patient taking differently: Take 8 mg by mouth 2 (two)  times daily as needed for refractory nausea / vomiting. 03/06/21  Yes Truitt Merle, MD  diphenoxylate-atropine (LOMOTIL) 2.5-0.025 MG tablet Take 2 tablets by mouth 4 (four) times daily as needed for diarrhea or loose stools. Patient not taking: Reported on 04/03/2022 01/06/22   Truitt Merle, MD     Vital Signs: BP 118/69 (BP Location: Left Wrist)   Pulse 91   Temp 97.9 F (36.6 C) (Oral)   Resp 18   Ht '5\' 4"'$  (1.626 m)   Wt 138 lb 3.7 oz (62.7 kg)   SpO2 95%   BMI 23.73 kg/m   Physical Exam pt awake, answers simple questions ok; NG in place; chest- dim BS bases; heart- RRR; abd- dist,few BS, some diffuse tenderness to palpation; bilat LE edema; TG drain intact, not sig tender, OP 40 cc turbid beige colored fluid; drain flushed  Imaging: DG Abd 1 View  Result Date: 04/14/2022 CLINICAL DATA:  Check gastric catheter placement EXAM: ABDOMEN - 1 VIEW COMPARISON:  Film from the previous day. FINDINGS: Gastric catheter is again coiled within the stomach. Scattered large and small bowel gas is noted with persistent small bowel dilatation. Pigtail catheter is noted in the pelvis consistent with recent pelvic abscess drainage IMPRESSION: Gastric catheter within the stomach. Stable gaseous distension of the large and small bowel. Electronically Signed   By: Inez Catalina M.D.   On: 04/14/2022 02:52   [place] EKG SITE RITE  Result Date: 04/13/2022 If Site Rite image not attached, placement could not be confirmed due to current cardiac  rhythm.  DG Abd 1 View  Result Date: 04/13/2022 CLINICAL DATA:  Evaluate NG tube EXAM: ABDOMEN - 1 VIEW COMPARISON:  April 12, 2022 FINDINGS: The NG tube is within the stomach. Air-filled dilated loops of large and small bowel again identified. IMPRESSION: The NG tube terminates in the stomach. Air-filled dilated loops of large and small bowel are again identified. The findings are most suggestive ileus. Electronically Signed   By: Dorise Bullion III M.D.   On: 04/13/2022 13:46     Labs:  CBC: Recent Labs    04/13/22 0420 04/14/22 0403 04/15/22 0325 04/16/22 0357  WBC 19.3* 11.9* 10.3 7.3  HGB 10.4* 9.7* 9.5* 9.8*  HCT 30.4* 29.0* 28.8* 30.5*  PLT 166 132* 102* 87*    COAGS: Recent Labs    05/18/21 2044 05/19/21 0618 04/13/22 0420 04/14/22 0403 04/15/22 0325 04/16/22 0357  INR 1.3*   < > 1.8* 2.1* 2.0* 1.7*  APTT 24  --   --   --   --   --    < > = values in this interval not displayed.    BMP: Recent Labs    04/13/22 0420 04/14/22 0403 04/15/22 0325 04/16/22 0357  NA 141 144 142 139  K 3.3* 4.0 3.2* 3.9  CL 113* 117* 116* 111  CO2 20* '22 23 24  '$ GLUCOSE 98 112* 162* 176*  BUN '12 16 15 15  '$ CALCIUM 8.7* 8.5* 8.2* 8.2*  CREATININE 0.39* 0.41* 0.41* 0.43*  GFRNONAA >60 >60 >60 >60    LIVER FUNCTION TESTS: Recent Labs    04/13/22 0420 04/14/22 0403 04/15/22 0325 04/16/22 0357  BILITOT 2.2* 1.8* 1.7* 1.3*  AST '20 17 18 22  '$ ALT '14 13 13 15  '$ ALKPHOS 83 72 66 71  PROT 5.7* 5.4* 5.3* 5.5*  ALBUMIN 1.9* 1.9* 1.8* 1.9*    Assessment and Plan: Pt with hx of metastatic colorectal cancer, recent admission for diarrhea and colitis along with new pelvic abscess concerning for contained perforation at cancer; status post pelvic abscess drain placement on 8/23; on IV zosyn; cx- e coli, prevotella, rare pseudomonas, enterococcus; she currently has an NG tube placed for gastric and small intestinal decompression due to a combination of ileus and probable metastatic obstruction at the rectosigmoid junction; request now received from pall care/CCS for venting G tube; afebrile; WBC nl, hgb 9.8, plts 87k, PT 19.7/INR 1.7, creat 0.43, ammonia nl. Imaging studies have been reviewed by Dr. Laurence Ferrari.Risks and benefits image guided venting gastrostomy tube placement was discussed with the patient /spouse including, but not limited to the need for a barium enema during the procedure, bleeding, infection, peritonitis and/or damage to adjacent  structures.  All of the patient's questions were answered, patient is agreeable to proceed.  Consent signed and in chart. Procedure tent planned for 8/31.    Electronically Signed: D. Rowe Robert, PA-C 04/16/2022, 4:05 PM   I spent a total of 25 Minutes at the the patient's bedside AND on the patient's hospital floor or unit, greater than 50% of which was counseling/coordinating care for pelvic abscess drain, plans for venting gastrostomy tube placement    Patient ID: Angela Mcpherson, female   DOB: 1960-11-23, 61 y.o.   MRN: [id number]

## 2022-04-17 ENCOUNTER — Inpatient Hospital Stay (HOSPITAL_COMMUNITY): Payer: 59

## 2022-04-17 ENCOUNTER — Encounter (HOSPITAL_COMMUNITY): Payer: Self-pay | Admitting: Internal Medicine

## 2022-04-17 DIAGNOSIS — R7881 Bacteremia: Secondary | ICD-10-CM | POA: Diagnosis not present

## 2022-04-17 DIAGNOSIS — G9341 Metabolic encephalopathy: Secondary | ICD-10-CM | POA: Diagnosis not present

## 2022-04-17 DIAGNOSIS — D689 Coagulation defect, unspecified: Secondary | ICD-10-CM | POA: Diagnosis not present

## 2022-04-17 DIAGNOSIS — C19 Malignant neoplasm of rectosigmoid junction: Secondary | ICD-10-CM | POA: Diagnosis not present

## 2022-04-17 DIAGNOSIS — K7460 Unspecified cirrhosis of liver: Secondary | ICD-10-CM | POA: Diagnosis not present

## 2022-04-17 DIAGNOSIS — K56600 Partial intestinal obstruction, unspecified as to cause: Secondary | ICD-10-CM | POA: Diagnosis not present

## 2022-04-17 DIAGNOSIS — N739 Female pelvic inflammatory disease, unspecified: Secondary | ICD-10-CM | POA: Diagnosis not present

## 2022-04-17 LAB — COMPREHENSIVE METABOLIC PANEL
ALT: 15 U/L (ref 0–44)
AST: 22 U/L (ref 15–41)
Albumin: 1.7 g/dL — ABNORMAL LOW (ref 3.5–5.0)
Alkaline Phosphatase: 62 U/L (ref 38–126)
Anion gap: 5 (ref 5–15)
BUN: 15 mg/dL (ref 6–20)
CO2: 27 mmol/L (ref 22–32)
Calcium: 8 mg/dL — ABNORMAL LOW (ref 8.9–10.3)
Chloride: 105 mmol/L (ref 98–111)
Creatinine, Ser: 0.42 mg/dL — ABNORMAL LOW (ref 0.44–1.00)
GFR, Estimated: >60 mL/min (ref >60)
Glucose, Bld: 152 mg/dL — ABNORMAL HIGH (ref 70–99)
Potassium: 3.8 mmol/L (ref 3.5–5.1)
Sodium: 137 mmol/L (ref 135–145)
Total Bilirubin: 1.3 mg/dL — ABNORMAL HIGH (ref 0.3–1.2)
Total Protein: 4.9 g/dL — ABNORMAL LOW (ref 6.5–8.1)

## 2022-04-17 LAB — MAGNESIUM: Magnesium: 1.6 mg/dL — ABNORMAL LOW (ref 1.7–2.4)

## 2022-04-17 LAB — GLUCOSE, CAPILLARY
Glucose-Capillary: 152 mg/dL — ABNORMAL HIGH (ref 70–99)
Glucose-Capillary: 154 mg/dL — ABNORMAL HIGH (ref 70–99)
Glucose-Capillary: 180 mg/dL — ABNORMAL HIGH (ref 70–99)
Glucose-Capillary: 204 mg/dL — ABNORMAL HIGH (ref 70–99)

## 2022-04-17 LAB — CBC WITH DIFFERENTIAL/PLATELET
Abs Immature Granulocytes: 0.09 10*3/uL — ABNORMAL HIGH (ref 0.00–0.07)
Basophils Absolute: 0 10*3/uL (ref 0.0–0.1)
Basophils Relative: 0 %
Eosinophils Absolute: 0 10*3/uL (ref 0.0–0.5)
Eosinophils Relative: 0 %
HCT: 27.4 % — ABNORMAL LOW (ref 36.0–46.0)
Hemoglobin: 9 g/dL — ABNORMAL LOW (ref 12.0–15.0)
Immature Granulocytes: 1 %
Lymphocytes Relative: 7 %
Lymphs Abs: 0.5 10*3/uL — ABNORMAL LOW (ref 0.7–4.0)
MCH: 34.5 pg — ABNORMAL HIGH (ref 26.0–34.0)
MCHC: 32.8 g/dL (ref 30.0–36.0)
MCV: 105 fL — ABNORMAL HIGH (ref 80.0–100.0)
Monocytes Absolute: 0.5 10*3/uL (ref 0.1–1.0)
Monocytes Relative: 7 %
Neutro Abs: 5.9 10*3/uL (ref 1.7–7.7)
Neutrophils Relative %: 85 %
Platelets: 78 10*3/uL — ABNORMAL LOW (ref 150–400)
RBC: 2.61 MIL/uL — ABNORMAL LOW (ref 3.87–5.11)
RDW: 24.8 % — ABNORMAL HIGH (ref 11.5–15.5)
WBC: 7 10*3/uL (ref 4.0–10.5)
nRBC: 0 % (ref 0.0–0.2)

## 2022-04-17 LAB — TYPE AND SCREEN
ABO/RH(D): O NEG
Antibody Screen: NEGATIVE

## 2022-04-17 LAB — PROTIME-INR
INR: 1.9 — ABNORMAL HIGH (ref 0.8–1.2)
Prothrombin Time: 21.2 seconds — ABNORMAL HIGH (ref 11.4–15.2)

## 2022-04-17 LAB — TRIGLYCERIDES: Triglycerides: 54 mg/dL (ref <150)

## 2022-04-17 LAB — PHOSPHORUS: Phosphorus: 2.4 mg/dL — ABNORMAL LOW (ref 2.5–4.6)

## 2022-04-17 MED ORDER — SODIUM CHLORIDE 0.9% IV SOLUTION: Freq: Once | INTRAVENOUS | Status: DC

## 2022-04-17 MED ORDER — POTASSIUM PHOSPHATES 15 MMOLE/5ML IV SOLN
15.0000 mmol | Freq: Once | INTRAVENOUS | Status: AC
  Administered 2022-04-17: 15 mmol via INTRAVENOUS
  Filled 2022-04-17: qty 5

## 2022-04-17 MED ORDER — MAGNESIUM SULFATE 4 GM/100ML IV SOLN
4.0000 g | Freq: Once | INTRAVENOUS | Status: AC
  Administered 2022-04-17: 4 g via INTRAVENOUS
  Filled 2022-04-17: qty 100

## 2022-04-17 MED ORDER — TRAVASOL 10 % IV SOLN
INTRAVENOUS | Status: AC
  Filled 2022-04-17: qty 900

## 2022-04-17 NOTE — Progress Notes (Signed)
Attempted to see pt for therapy today. Pt stated her legs are swollen and she will not get up with them this way.  Explained how moving around may help but pt continued to decline. Will attempt back as schedule allows.  Jinger Neighbors, [place] [phone]

## 2022-04-17 NOTE — H&P (View-Only) (Signed)
Patient ID: Angela Mcpherson, female   DOB: 01-15-1961, 61 y.o.   MRN: [id number]  Radiology does not feel that there is a safe window to allow percutaneous placement of a gastrostomy tube.  Also, the patient's colon seems very distended proximal to the obstruction in the rectum.  The patient likely has a competent ileocecal valve, this distention will not be relieved with the simply gastrostomy tube.  I had a discussion with Dr. Rhea Pink as well as a longer discussion with the patient and her husband.  We recommend laparoscopic-assisted diverting loop colostomy or ileostomy along with a laparoscopic placement of a gastrostomy tube.  The colostomy will help decompress the colon and subsequently the small bowel.  The gastrostomy tube will be available if the patient becomes distended or nauseous.  It can also be used for tube feeds if she does not have much appetite.  The patient does have significant ascites and liver dysfunction.  INR is 1.9.  We will administer 2 units of fresh frozen plasma tomorrow morning prior to surgery.  We will also administer platelets as she is thrombocytopenic.  I clearly explained that these measures are only palliative and not curative.  Their goal is to get the patient out of the hospital and home.    We will plan laparoscopic assisted loop colostomy/ileostomy and laparoscopic assisted gastrostomy tube placement tomorrow.  The surgical procedure has been discussed with the patient.  Potential risks, benefits, alternative treatments, and expected outcomes have been explained.  All of the patient's questions at this time have been answered.  The likelihood of reaching the patient's treatment goal is good.  The patient understand the proposed surgical procedure and wishes to proceed.   [name], MD, [facility]  General Surgery   04/17/2022 3:12 PM

## 2022-04-17 NOTE — Progress Notes (Signed)
Ordered a CT to evaluate the anatomy for a venting gastrostomy tube and to evaluate the pelvic abscess.  The colon is very distended and there is no percutaneous window to place a gastrostomy tube.  The bowel distention persists despite having a NGT so I don't think a venting gastrostomy tube will be very helpful.  I would consider a surgical option such as a diverting colostomy/ileostomy.

## 2022-04-17 NOTE — Progress Notes (Signed)
Patient ID: Angela Mcpherson, female   DOB: Apr 10, 1961, 61 y.o.   MRN: [id number]  Radiology does not feel that there is a safe window to allow percutaneous placement of a gastrostomy tube.  Also, the patient's colon seems very distended proximal to the obstruction in the rectum.  The patient likely has a competent ileocecal valve, this distention will not be relieved with the simply gastrostomy tube.  I had a discussion with Dr. Rhea Pink as well as a longer discussion with the patient and Angela Mcpherson husband.  We recommend laparoscopic-assisted diverting loop colostomy or ileostomy along with a laparoscopic placement of a gastrostomy tube.  The colostomy will help decompress the colon and subsequently the small bowel.  The gastrostomy tube will be available if the patient becomes distended or nauseous.  It can also be used for tube feeds if she does not have much appetite.  The patient does have significant ascites and liver dysfunction.  INR is 1.9.  We will administer 2 units of fresh frozen plasma tomorrow morning prior to surgery.  We will also administer platelets as she is thrombocytopenic.  I clearly explained that these measures are only palliative and not curative.  Their goal is to get the patient out of the hospital and home.    We will plan laparoscopic assisted loop colostomy/ileostomy and laparoscopic assisted gastrostomy tube placement tomorrow.  The surgical procedure has been discussed with the patient.  Potential risks, benefits, alternative treatments, and expected outcomes have been explained.  All of the patient's questions at this time have been answered.  The likelihood of reaching the patient's treatment goal is good.  The patient understand the proposed surgical procedure and wishes to proceed.   [name], MD, [facility] Surgery  General Surgery   04/17/2022 3:12 PM

## 2022-04-17 NOTE — Progress Notes (Signed)
Patient ID: Angela Mcpherson, female   DOB: July 07, 1961, 61 y.o.   MRN: [id number] Pt afebrile; no new c/o; abd remains distended;TG/pelvic drain OP 10 cc; latest CT done yesterday shows decompressed drained pelvic abscess; WBC NL, hgb 9(9.8), PT/INR 21.2/1.9(19.7/1.7), creat 0.42; rec continuing pelvic drain for now until CCS has f/u with pt regarding any potential surgical plans; pt will need drain injection prior to removal; venting G tube cannot be placed percutaneously based on latest CT findings with no access window available- see Dr. Moises Blood note. Above d/w pt/spouse this am. CCS /TRH also aware.

## 2022-04-17 NOTE — Progress Notes (Signed)
Patient ID: Angela Mcpherson, female   DOB: 07-Feb-1961, 62 y.o.   MRN: [id number]   I have reviewed the notes from [facility] and Palliative care.  Have noted plans for rad onc consultation.  IR evaluating for possible placement of venting gastrostomy tube.  We are available if the decision changes and she needs surgical palliative intervention with a diverting loop colostomy/ ileostomy.  [name], MD, [facility] Surgery  General Surgery   04/17/2022 7:39 AM

## 2022-04-17 NOTE — Progress Notes (Signed)
PHARMACY - TOTAL PARENTERAL NUTRITION CONSULT NOTE   Indication: Prolonged ileus  Patient Measurements: Height: '5\' 4"'$  (162.6 cm) Weight: 62.7 kg (138 lb 3.7 oz) IBW/kg (Calculated) : 54.7 TPN AdjBW (KG): 49.3 Body mass index is 23.73 kg/m. Usual Weight:   Assessment:  61 y.o. 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  8/22 CT shows new left sided abscess, not present on admission; on 8/23, CCS and IR consulted and underwent perc drain placement. On 8/24, AXR showed dilated SB and NGT placed on 8/26.  Patient removed NGT 8/27 and unable to replace at this time. On 8/27, Pharmacy consulted to dose TPN.  Glucose / Insulin: No hx DM. Goal CBGs 100-150.  - CBG range 152-188 - 8 units sSSI required - Dexamethasone '8mg'$  IV daily x 10 days started 8/29 Electrolytes: Mg and Phos low, Corr Cal 9.84 WNL. Renal: BUN/SCr WNL Hepatic: AST/ALT, AlkPhos WNL, Tbili elevated 1.3 but improving Triglycerides: WNL 8/28 and 8/31 Intake / Output; urine x 2 occurences, no stools; 300cc NGT, 10cc drain output  MIVF: NS at 10/hr, octreotide drip at 7m/hr started 8/29 GI Imaging: as above GI Surgeries / Procedures:  8/28: s/p L transgluteal drain placement by IR  Central access: PICC TPN start date: 8/28  Nutritional Goals: Goal TPN rate is 75 mL/hr (provides 90 g of protein and 1818 kcals per day)  RD Assessment: 8/24 Estimated Needs Total Energy Estimated Needs: 1700-2000 kcal Total Protein Estimated Needs: 85-95 grams Total Fluid Estimated Needs: >/= 2 L/day  Current Nutrition:  NPO TPN at 75 ml/hr  Plan:  Now: Mg Sulfate 4g IV x 1, KPhos 177ml (2229mK+) IV x 1  Continue TPN at goal rate 17m69m at 1800 Electrolytes in TPN: no changes today Na 50mE53m  K 50mEq36m Ca 5mEq/L38mMg 5mEq/L,70mhos 15mmol/L23ml:Ac 1:2 Add standard MVI and trace elements  to TPN Continue Sensitive q6h SSI and adjust as needed  Add 100 mg Thiamine x 5 days given refeeding syndrome risk per RD recommendations (started 8/29) [facility] Monitor TPN labs on Mon/Thurs, CMET/Mg/Phos inAM  [place] [facility] Pharmacy: [phone],7:02 AM

## 2022-04-17 NOTE — Progress Notes (Signed)
[facility] Progress Note  Patient: Angela Mcpherson     GEX:[id number]  DOA: 04/03/2022   PCP: Default, Provider, MD       Brief hospital course:   This is a 61 year old female with rectal cancer that is metastatic to the liver and lung, hep C cirrhosis, aortic mural thrombus who presented to the hospital with abdominal pain and confusion.  Initially she was found to have colitis. She was started on IV antibiotics.  Blood cultures ultimately grew E. coli and despite appropriate antibiotics WBC count continued to rise. 03/2019 CT revealed left-sided pelvic abscess On 8/23 she underwent left transgluteal drain placement for the pelvic abscess 8/24 she had increasing abdominal pain with an x-ray suggestive of an 8/26 NG tube was placed but the patient removed it the following day it was replaced a couple more times but the patient again removed this requiring it to be replaced a fourth time 8/29-started on steroids and octreotide  Subjective:  Continues to have abdominal distention and pain.  Assessment and Plan: Principal Problem: Severe Sepsis-   E coli and citrobacter bacteremia -Currently receiving Zosyn - Appreciate ID consult  Active Problems:  Metastatic colorectal cancer with pelvic abscess suspected to be secondary to the cancer -Have discussed the plan with Dr. Anselm Pancoast, Dr. Hilma Favors and Dr. 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) Thrombocytopenia (HCC) Elevated INR -Eliquis on hold    Hepatitis C cirrhosis and Splenomegaly    Acute urinary retention -Has resolved  DVT prophylaxis:  Place and maintain  sequential compression device Start: 04/04/22 0946     Code Status: DNR  Consultants: General surgery, palliative care Level of Care: Level of care: Med-Surg Disposition Plan:  Status is: Inpatient Remains inpatient appropriate because: Ongoing active IV treatments  Objective:   Vitals:   04/16/22 1254 04/16/22 2220 04/17/22 0450 04/17/22 1208  BP: 118/69 (!) 142/80 (!) 147/80 (!) 148/92  Pulse: 91 91 87 94  Resp: '18 20 20 20  '$ Temp: 97.9 F (36.6 C) 97.8 F (36.6 C) 97.8 F (36.6 C) 97.6 F (36.4 C)  TempSrc: Oral   Oral  SpO2: 95% 94% 93% 95%  Weight:      Height:       Filed Weights   04/04/22 0847 04/10/22 1247  Weight: 49.3 kg 62.7 kg   Exam: General exam: Appears comfortable  HEENT: PERRLA, oral mucosa moist, no sclera icterus or thrush Respiratory system: Clear to auscultation. Respiratory effort normal. Cardiovascular system: S1 & S2 heard, regular rate and rhythm Gastrointestinal system: Abdomen soft, distended, tympanic, high pitched bowel sounds Extremities: No cyanosis, clubbing - pedal edema Skin: No rashes or ulcers Psychiatry:  flat affect  Imaging and lab data was personally reviewed    CBC: Recent Labs  Lab 04/13/22 0420 04/14/22 0403 04/15/22 0325 04/16/22 0357 04/17/22 0317  WBC 19.3* 11.9* 10.3 7.3 7.0  NEUTROABS  --   --   --   --  5.9  HGB 10.4* 9.7* 9.5* 9.8* 9.0*  HCT 30.4* 29.0* 28.8* 30.5* 27.4*  MCV 98.4 100.7* 102.9* 105.9* 105.0*  PLT 166 132* 102* 87* 78*    Basic  Metabolic Panel: Recent Labs  Lab 04/13/22 0420 04/14/22 0403 04/15/22 0325 04/16/22 0357 04/17/22 0317  NA 141 144 142 139 137  K 3.3* 4.0 3.2* 3.9 3.8  CL 113* 117* 116* 111 105  CO2 20* '22 23 24 27  '$ GLUCOSE 98 112* 162* 176* 152*  BUN '12 16 15 15 15  '$ CREATININE 0.39* 0.41* 0.41* 0.43* 0.42*  CALCIUM 8.7* 8.5* 8.2* 8.2* 8.0*  MG  --  1.7 1.7 1.8 1.6*  PHOS  --  2.8 2.0* 3.1 2.4*    GFR: Estimated Creatinine Clearance: 64.6 mL/min (A) (by C-G  formula based on SCr of 0.42 mg/dL (L)).  Scheduled Meds:  sodium chloride   Intravenous Once   Chlorhexidine Gluconate Cloth  6 each Topical Q0600   dexamethasone (DECADRON) injection  8 mg Intravenous Daily   hydrocortisone  25 mg Rectal BID   insulin aspart  0-9 Units Subcutaneous Q6H   Continuous Infusions:  sodium chloride 10 mL/hr at 04/16/22 1628   octreotide (SANDOSTATIN) 500 mcg in sodium chloride 0.9 % 250 mL (2 mcg/mL) infusion 50 mcg/hr (04/17/22 1244)   piperacillin-tazobactam (ZOSYN)  IV 3.375 g (04/17/22 0123)   potassium PHOSPHATE IVPB (in mmol) 15 mmol (04/17/22 1244)   TPN ADULT (ION) 75 mL/hr at 04/16/22 1746   TPN ADULT (ION)       LOS: 14 days   Author: Debbe Odea  04/17/2022 3:53 PM

## 2022-04-17 NOTE — Consult Note (Signed)
[facility] [facility] Nurse requested for preoperative stoma site marking  Discussed surgical procedure and stoma creation with patient and family.  Explained role of the [facility] nurse team.  Provided the patient with educational booklet and provided samples of pouching options.  Answered patient and family questions.   Examined patient lying & sitting in order to place the marking in the patient's visual field, away from any creases or abdominal contour issues and within the rectus muscle.  Attempted to mark below the patient's belt line.   Marked for colostomy in the LUQ  __5__ cm to the left of the umbilicus and __5__QM above the umbilicus.  Marked for ileostomy in the RUQ  __5__cm to the right of the umbilicus and  ____ cm above the umbilicus.  Please note: the patient's abdomen is significantly distended. The patient, her spouse, and I discussed with this amount of distension it impossible to know how the contours of the abdomen will be after surgery and the distention is reduced. The patient and her spouse indicated the fold she normally has is at the level of the umbilicus. I placed both marks above the umbilicus.  Patient's abdomen cleansed with CHG wipes at site markings, allowed to air dry prior to marking.Covered mark with thin film transparent dressing to preserve mark until date of surgery.   [place] [facility] Nurse team will follow up with patient after surgery for continue ostomy care and teaching.  Val Riles, RN, MSN, CWOCN, CNS-BC, pager [phone]

## 2022-04-17 NOTE — Progress Notes (Signed)
[facility] for Infectious Disease  Date of Admission:  04/03/2022     Total days of antibiotics 14         ASSESSMENT:  Ms. Rosario Adie follow up CT scan with no evidence of residual abscess and drain output decreasing now down to 10cc. CT also remarkable for possible small bowel obstruction or ilieus. General Surgery and IR weighing options with apparent recommendations being palliative intervention with diverting loop colostomy/ileostomy. Given radiographic evidence of resolution of the abscess source control has been achieved and would recommend continuing antibiotics for additional 3-4 days then stopping antibiotics. Drain being continued until surgical course being determined. Continue piperacillin-tazobactam for 3-4 more days. Remaining medical and supportive care per primary team.   PLAN:  Continue piperacillin-tazobactam until 9/2. Surgical course per General Surgery and IR. Remaining medical and supportive care per primary team.    Active Problems:   Malignant neoplasm of rectosigmoid junction (HCC)   Acute metabolic encephalopathy   Aortic mural thrombus (HCC)   Thrombocytopenia (HCC)   Hepatitis C   Normocytic anemia   Myocardial injury   Acute urinary retention   Malnutrition of moderate degree   E coli and citrobacter bacteremia   Pelvic abscess in female   Hepatic cirrhosis (HCC)   Hypokalemia   Hypomagnesemia   Coagulopathy (HCC)   Ileus vs bowel obstruction   Hypophosphatemia    Chlorhexidine Gluconate Cloth  6 each Topical Q0600   dexamethasone (DECADRON) injection  8 mg Intravenous Daily   hydrocortisone  25 mg Rectal BID   insulin aspart  0-9 Units Subcutaneous Q6H    SUBJECTIVE:  Afebrile overnight with no acute events. Feeling pretty good today. Husband at bedside.   Allergies  Allergen Reactions   Codeine     unknown   Epinephrine (Anaphylaxis)     unknown   Orange Fruit [Citrus] Other (See Comments)    Blisters on face.    Streptogramins     unknown   Tomato Other (See Comments)    Blisters on face     Review of Systems: Review of Systems  Constitutional:  Negative for chills, fever and weight loss.  Respiratory:  Negative for cough, shortness of breath and wheezing.   Cardiovascular:  Positive for leg swelling. Negative for chest pain.  Gastrointestinal:  Negative for abdominal pain, constipation, diarrhea, nausea and vomiting.  Skin:  Negative for rash.      OBJECTIVE: Vitals:   04/16/22 1254 04/16/22 2220 04/17/22 0450 04/17/22 1208  BP: 118/69 (!) 142/80 (!) 147/80 (!) 148/92  Pulse: 91 91 87 94  Resp: '18 20 20 20  '$ Temp: 97.9 F (36.6 C) 97.8 F (36.6 C) 97.8 F (36.6 C) 97.6 F (36.4 C)  TempSrc: Oral   Oral  SpO2: 95% 94% 93% 95%  Weight:      Height:       Body mass index is 23.73 kg/m.  Physical Exam Constitutional:      General: She is not in acute distress.    Appearance: She is well-developed.  HENT:     Nose:     Comments: NG tube in place.  Cardiovascular:     Rate and Rhythm: Normal rate and regular rhythm.     Heart sounds: Normal heart sounds.     Comments: Bilateral lower extremity edema.  Pulmonary:     Effort: Pulmonary effort is normal.     Breath sounds: Normal breath sounds.  Skin:    General: Skin is warm and  dry.  Neurological:     Mental Status: She is alert and oriented to person, place, and time.  Psychiatric:        Mood and Affect: Mood normal.     Lab Results Lab Results  Component Value Date   WBC 7.0 04/17/2022   HGB 9.0 (L) 04/17/2022   HCT 27.4 (L) 04/17/2022   MCV 105.0 (H) 04/17/2022   PLT 78 (L) 04/17/2022    Lab Results  Component Value Date   CREATININE 0.42 (L) 04/17/2022   BUN 15 04/17/2022   NA 137 04/17/2022   K 3.8 04/17/2022   CL 105 04/17/2022   CO2 27 04/17/2022    Lab Results  Component Value Date   ALT 15 04/17/2022   AST 22 04/17/2022   ALKPHOS 62 04/17/2022   BILITOT 1.3 (H) 04/17/2022      Microbiology: Recent Results (from the past 240 hour(s))  Culture, blood (Routine X 2) w Reflex to ID Panel     Status: None   Collection Time: 04/08/22  9:30 AM   Specimen: BLOOD  Result Value Ref Range Status   Specimen Description   Final    BLOOD SITE NOT SPECIFIED Performed at [facility] [address] [place], [place] [address]    Special Requests   Final    BOTTLES DRAWN AEROBIC AND ANAEROBIC Blood Culture adequate volume Performed at [place] [address] [place], [place] [address]    Culture   Final    NO GROWTH 5 DAYS Performed at [facility], [place] [address] [place], [place] [address]    Report Status 04/13/2022 FINAL  Final  Culture, blood (Routine X 2) w Reflex to ID Panel     Status: None   Collection Time: 04/08/22  9:59 AM   Specimen: BLOOD  Result Value Ref Range Status   Specimen Description   Final    BLOOD Performed at [facility] [address] [place], [place] [address]    Special Requests   Final    BLOOD Performed at [facility], [place] [address], [place], [place] [address]    Culture   Final    NO GROWTH 5 DAYS Performed at [facility], [place] [address], [place], [place] [address]    Report Status 04/13/2022 FINAL  Final  Aerobic/Anaerobic Culture w Gram Stain (surgical/deep wound)     Status: None   Collection Time: 04/09/22  4:24 PM   Specimen: Abscess  Result Value Ref Range Status   Specimen Description   Final    ABSCESS Performed at [facility] [address], [place], [place] [address]    Special Requests ABDOMEN  Final   Gram Stain   Final    ABUNDANT WBC PRESENT, PREDOMINANTLY PMN ABUNDANT GRAM NEGATIVE RODS FEW GRAM POSITIVE COCCI IN PAIRS Performed at [facility], [place] [address] [place], [place] [address]    Culture   Final    ABUNDANT ESCHERICHIA COLI MODERATE PREVOTELLA DENTICOLA BETA  LACTAMASE POSITIVE RARE PSEUDOMONAS AERUGINOSA MODERATE ENTEROCOCCUS FAECALIS    Report Status 04/15/2022 FINAL  Final   Organism ID, Bacteria ESCHERICHIA COLI  Final   Organism ID, Bacteria PSEUDOMONAS AERUGINOSA  Final   Organism ID, Bacteria ENTEROCOCCUS FAECALIS  Final      Susceptibility   Escherichia coli - MIC*    AMPICILLIN <=2 SENSITIVE Sensitive     CEFAZOLIN <=4 SENSITIVE Sensitive  CEFEPIME <=0.12 SENSITIVE Sensitive     CEFTAZIDIME <=1 SENSITIVE Sensitive     CEFTRIAXONE <=0.25 SENSITIVE Sensitive     CIPROFLOXACIN <=0.25 SENSITIVE Sensitive     GENTAMICIN <=1 SENSITIVE Sensitive     IMIPENEM <=0.25 SENSITIVE Sensitive     TRIMETH/SULFA <=20 SENSITIVE Sensitive     AMPICILLIN/SULBACTAM <=2 SENSITIVE Sensitive     PIP/TAZO <=4 SENSITIVE Sensitive     * ABUNDANT ESCHERICHIA COLI   Enterococcus faecalis - MIC*    AMPICILLIN <=2 SENSITIVE Sensitive     VANCOMYCIN 1 SENSITIVE Sensitive     GENTAMICIN SYNERGY SENSITIVE Sensitive     * MODERATE ENTEROCOCCUS FAECALIS   Pseudomonas aeruginosa - MIC*    CEFTAZIDIME 2 SENSITIVE Sensitive     CIPROFLOXACIN 0.5 SENSITIVE Sensitive     GENTAMICIN <=1 SENSITIVE Sensitive     IMIPENEM 1 SENSITIVE Sensitive     PIP/TAZO <=4 SENSITIVE Sensitive     CEFEPIME 2 SENSITIVE Sensitive     * RARE PSEUDOMONAS AERUGINOSA     Terri Piedra, NP [facility] for Infectious [facility]  04/17/2022  1:17 PM

## 2022-04-18 ENCOUNTER — Other Ambulatory Visit: Payer: Self-pay

## 2022-04-18 ENCOUNTER — Inpatient Hospital Stay (HOSPITAL_COMMUNITY): Payer: 59 | Admitting: Certified Registered"

## 2022-04-18 ENCOUNTER — Encounter (HOSPITAL_COMMUNITY): Payer: Self-pay | Admitting: Internal Medicine

## 2022-04-18 ENCOUNTER — Encounter (HOSPITAL_COMMUNITY)
Admission: EM | Disposition: A | Discharge status: Hospice | Payer: Self-pay | Source: Home / Self Care | Attending: Internal Medicine

## 2022-04-18 DIAGNOSIS — C20 Malignant neoplasm of rectum: Secondary | ICD-10-CM

## 2022-04-18 DIAGNOSIS — N739 Female pelvic inflammatory disease, unspecified: Secondary | ICD-10-CM | POA: Diagnosis not present

## 2022-04-18 DIAGNOSIS — D630 Anemia in neoplastic disease: Secondary | ICD-10-CM | POA: Diagnosis not present

## 2022-04-18 DIAGNOSIS — R7881 Bacteremia: Secondary | ICD-10-CM | POA: Diagnosis not present

## 2022-04-18 DIAGNOSIS — K56600 Partial intestinal obstruction, unspecified as to cause: Secondary | ICD-10-CM | POA: Diagnosis not present

## 2022-04-18 DIAGNOSIS — C799 Secondary malignant neoplasm of unspecified site: Secondary | ICD-10-CM

## 2022-04-18 DIAGNOSIS — C19 Malignant neoplasm of rectosigmoid junction: Secondary | ICD-10-CM | POA: Diagnosis not present

## 2022-04-18 HISTORY — PX: LAPAROSCOPY: SHX197

## 2022-04-18 HISTORY — PX: COLON RESECTION: SHX5231

## 2022-04-18 LAB — COMPREHENSIVE METABOLIC PANEL
ALT: 24 U/L (ref 0–44)
AST: 40 U/L (ref 15–41)
Albumin: 1.8 g/dL — ABNORMAL LOW (ref 3.5–5.0)
Alkaline Phosphatase: 64 U/L (ref 38–126)
Anion gap: 4 — ABNORMAL LOW (ref 5–15)
BUN: 17 mg/dL (ref 6–20)
CO2: 28 mmol/L (ref 22–32)
Calcium: 7.7 mg/dL — ABNORMAL LOW (ref 8.9–10.3)
Chloride: 102 mmol/L (ref 98–111)
Creatinine, Ser: 0.41 mg/dL — ABNORMAL LOW (ref 0.44–1.00)
GFR, Estimated: >60 mL/min (ref >60)
Glucose, Bld: 165 mg/dL — ABNORMAL HIGH (ref 70–99)
Potassium: 4.1 mmol/L (ref 3.5–5.1)
Sodium: 134 mmol/L — ABNORMAL LOW (ref 135–145)
Total Bilirubin: 1.4 mg/dL — ABNORMAL HIGH (ref 0.3–1.2)
Total Protein: 5.1 g/dL — ABNORMAL LOW (ref 6.5–8.1)

## 2022-04-18 LAB — PROTIME-INR
INR: 1.8 — ABNORMAL HIGH (ref 0.8–1.2)
Prothrombin Time: 20.9 seconds — ABNORMAL HIGH (ref 11.4–15.2)

## 2022-04-18 LAB — GLUCOSE, CAPILLARY
Glucose-Capillary: 150 mg/dL — ABNORMAL HIGH (ref 70–99)
Glucose-Capillary: 155 mg/dL — ABNORMAL HIGH (ref 70–99)
Glucose-Capillary: 161 mg/dL — ABNORMAL HIGH (ref 70–99)
Glucose-Capillary: 222 mg/dL — ABNORMAL HIGH (ref 70–99)

## 2022-04-18 LAB — CBC
HCT: 28.4 % — ABNORMAL LOW (ref 36.0–46.0)
Hemoglobin: 9.3 g/dL — ABNORMAL LOW (ref 12.0–15.0)
MCH: 34.6 pg — ABNORMAL HIGH (ref 26.0–34.0)
MCHC: 32.7 g/dL (ref 30.0–36.0)
MCV: 105.6 fL — ABNORMAL HIGH (ref 80.0–100.0)
Platelets: 69 10*3/uL — ABNORMAL LOW (ref 150–400)
RBC: 2.69 MIL/uL — ABNORMAL LOW (ref 3.87–5.11)
RDW: 24.2 % — ABNORMAL HIGH (ref 11.5–15.5)
WBC: 8.1 10*3/uL (ref 4.0–10.5)
nRBC: 0 % (ref 0.0–0.2)

## 2022-04-18 LAB — PHOSPHORUS: Phosphorus: 2.4 mg/dL — ABNORMAL LOW (ref 2.5–4.6)

## 2022-04-18 LAB — MAGNESIUM: Magnesium: 1.7 mg/dL (ref 1.7–2.4)

## 2022-04-18 SURGERY — LAPAROSCOPIC RIGHT COLON RESECTION
Anesthesia: General | Site: Abdomen

## 2022-04-18 MED ORDER — ROCURONIUM BROMIDE 10 MG/ML (PF) SYRINGE
PREFILLED_SYRINGE | INTRAVENOUS | Status: DC | PRN
  Administered 2022-04-18: 40 mg via INTRAVENOUS

## 2022-04-18 MED ORDER — SUCCINYLCHOLINE CHLORIDE 200 MG/10ML IV SOSY
PREFILLED_SYRINGE | INTRAVENOUS | Status: AC
  Filled 2022-04-18: qty 10

## 2022-04-18 MED ORDER — ONDANSETRON HCL 4 MG/2ML IJ SOLN
INTRAMUSCULAR | Status: DC | PRN
  Administered 2022-04-18: 4 mg via INTRAVENOUS

## 2022-04-18 MED ORDER — ESMOLOL HCL 100 MG/10ML IV SOLN
INTRAVENOUS | Status: DC | PRN
  Administered 2022-04-18: 40 mg via INTRAVENOUS
  Administered 2022-04-18: 20 mg via INTRAVENOUS
  Administered 2022-04-18: 30 mg via INTRAVENOUS

## 2022-04-18 MED ORDER — LIDOCAINE HCL (CARDIAC) PF 100 MG/5ML IV SOSY
PREFILLED_SYRINGE | INTRAVENOUS | Status: DC | PRN
  Administered 2022-04-18: 80 mg via INTRAVENOUS

## 2022-04-18 MED ORDER — CHLORHEXIDINE GLUCONATE 0.12 % MT SOLN
15.0000 mL | Freq: Once | OROMUCOSAL | Status: AC
  Administered 2022-04-18: 15 mL via OROMUCOSAL

## 2022-04-18 MED ORDER — DEXAMETHASONE SODIUM PHOSPHATE 10 MG/ML IJ SOLN
INTRAMUSCULAR | Status: AC
Start: 2022-04-18 — End: ?
  Filled 2022-04-18: qty 1

## 2022-04-18 MED ORDER — FENTANYL CITRATE (PF) 250 MCG/5ML IJ SOLN
INTRAMUSCULAR | Status: DC | PRN
  Administered 2022-04-18 (×4): 50 ug via INTRAVENOUS

## 2022-04-18 MED ORDER — FENTANYL CITRATE (PF) 100 MCG/2ML IJ SOLN
INTRAMUSCULAR | Status: AC
  Filled 2022-04-18: qty 2

## 2022-04-18 MED ORDER — BUPIVACAINE-EPINEPHRINE 0.25% -1:200000 IJ SOLN
INTRAMUSCULAR | Status: DC | PRN
  Administered 2022-04-18: 30 mL

## 2022-04-18 MED ORDER — BUPIVACAINE LIPOSOME 1.3 % IJ SUSP
INTRAMUSCULAR | Status: AC
  Filled 2022-04-18: qty 20

## 2022-04-18 MED ORDER — PROPOFOL 10 MG/ML IV BOLUS
INTRAVENOUS | Status: DC | PRN
  Administered 2022-04-18: 130 mg via INTRAVENOUS

## 2022-04-18 MED ORDER — MIDAZOLAM HCL 2 MG/2ML IJ SOLN
INTRAMUSCULAR | Status: DC | PRN
  Administered 2022-04-18: 1 mg via INTRAVENOUS

## 2022-04-18 MED ORDER — DEXAMETHASONE SODIUM PHOSPHATE 10 MG/ML IJ SOLN
INTRAMUSCULAR | Status: DC | PRN
  Administered 2022-04-18: 4 mg via INTRAVENOUS

## 2022-04-18 MED ORDER — LACTATED RINGERS IV SOLN: INTRAVENOUS | Status: DC

## 2022-04-18 MED ORDER — TRACE MINERALS CU-MN-SE-ZN 300-55-60-3000 MCG/ML IV SOLN
INTRAVENOUS | Status: DC
  Filled 2022-04-18: qty 616

## 2022-04-18 MED ORDER — MIDAZOLAM HCL 2 MG/2ML IJ SOLN
INTRAMUSCULAR | Status: AC
  Filled 2022-04-18: qty 2

## 2022-04-18 MED ORDER — FENTANYL CITRATE PF 50 MCG/ML IJ SOSY: 25.0000 ug | PREFILLED_SYRINGE | INTRAMUSCULAR | Status: DC | PRN

## 2022-04-18 MED ORDER — PROPOFOL 10 MG/ML IV BOLUS
INTRAVENOUS | Status: AC
  Filled 2022-04-18: qty 20

## 2022-04-18 MED ORDER — ACETAMINOPHEN 500 MG PO TABS: 1000.0000 mg | ORAL_TABLET | Freq: Once | ORAL | Status: DC

## 2022-04-18 MED ORDER — LIDOCAINE HCL (PF) 2 % IJ SOLN
INTRAMUSCULAR | Status: AC
  Filled 2022-04-18: qty 5

## 2022-04-18 MED ORDER — BUPIVACAINE-EPINEPHRINE (PF) 0.25% -1:200000 IJ SOLN
INTRAMUSCULAR | Status: AC
  Filled 2022-04-18: qty 30

## 2022-04-18 MED ORDER — ROCURONIUM BROMIDE 10 MG/ML (PF) SYRINGE
PREFILLED_SYRINGE | INTRAVENOUS | Status: AC
  Filled 2022-04-18: qty 10

## 2022-04-18 MED ORDER — SUGAMMADEX SODIUM 200 MG/2ML IV SOLN
INTRAVENOUS | Status: DC | PRN
  Administered 2022-04-18: 130 mg via INTRAVENOUS

## 2022-04-18 MED ORDER — FUROSEMIDE 10 MG/ML IJ SOLN
20.0000 mg | Freq: Every day | INTRAMUSCULAR | Status: DC
  Administered 2022-04-18 – 2022-04-21 (×4): 20 mg via INTRAVENOUS
  Filled 2022-04-18 (×4): qty 2

## 2022-04-18 MED ORDER — LIDOCAINE 2% (20 MG/ML) 5 ML SYRINGE
INTRAMUSCULAR | Status: DC | PRN
  Administered 2022-04-18: 60 mg via INTRAVENOUS

## 2022-04-18 MED ORDER — SODIUM CHLORIDE 0.9% IV SOLUTION: Freq: Once | INTRAVENOUS | Status: DC

## 2022-04-18 MED ORDER — PROMETHAZINE HCL 25 MG/ML IJ SOLN: 6.2500 mg | INTRAMUSCULAR | Status: DC | PRN

## 2022-04-18 MED ORDER — BUPIVACAINE LIPOSOME 1.3 % IJ SUSP
INTRAMUSCULAR | Status: DC | PRN
  Administered 2022-04-18: 20 mL

## 2022-04-18 MED ORDER — INSULIN ASPART 100 UNIT/ML IJ SOLN: 0.0000 [IU] | Freq: Four times a day (QID) | INTRAMUSCULAR | Status: DC

## 2022-04-18 MED ORDER — ORAL CARE MOUTH RINSE: 15.0000 mL | Freq: Once | OROMUCOSAL | Status: AC

## 2022-04-18 MED ORDER — SUCCINYLCHOLINE CHLORIDE 200 MG/10ML IV SOSY
PREFILLED_SYRINGE | INTRAVENOUS | Status: DC | PRN
  Administered 2022-04-18: 100 mg via INTRAVENOUS

## 2022-04-18 MED ORDER — ONDANSETRON HCL 4 MG/2ML IJ SOLN
INTRAMUSCULAR | Status: AC
  Filled 2022-04-18: qty 2

## 2022-04-18 SURGICAL SUPPLY — 46 items
ADH SKN CLS APL DERMABOND .7 (GAUZE/BANDAGES/DRESSINGS) ×4
ADH SKN CLS LQ APL DERMABOND (GAUZE/BANDAGES/DRESSINGS) ×2
BAG DRN RND TRDRP ANRFLXCHMBR (UROLOGICAL SUPPLIES) ×2
BAG URINE DRAIN 2000ML AR STRL (UROLOGICAL SUPPLIES) IMPLANT
BLADE HEX COATED 2.75 (ELECTRODE) ×3 IMPLANT
CATH BOLUS GASTRO 22FR (CATHETERS) IMPLANT
COVER SURGICAL LIGHT HANDLE (MISCELLANEOUS) ×3 IMPLANT
DERMABOND ADHESIVE PROPEN (GAUZE/BANDAGES/DRESSINGS) ×2
DERMABOND ADVANCED (GAUZE/BANDAGES/DRESSINGS) ×4
DERMABOND ADVANCED .7 DNX12 (GAUZE/BANDAGES/DRESSINGS) IMPLANT
DERMABOND ADVANCED .7 DNX6 (GAUZE/BANDAGES/DRESSINGS) IMPLANT
DRAPE LAPAROSCOPIC ABDOMINAL (DRAPES) ×3 IMPLANT
DRSG OPSITE POSTOP 4X10 (GAUZE/BANDAGES/DRESSINGS) IMPLANT
ELECT REM PT RETURN 15FT ADLT (MISCELLANEOUS) ×3 IMPLANT
GLOVE BIO SURGEON STRL SZ7 (GLOVE) ×6 IMPLANT
GLOVE BIOGEL PI IND STRL 7.0 (GLOVE) ×3 IMPLANT
GLOVE BIOGEL PI IND STRL 7.5 (GLOVE) ×3 IMPLANT
GLOVE BIOGEL PI INDICATOR 7.0 (GLOVE) ×4
GLOVE BIOGEL PI INDICATOR 7.5 (GLOVE) ×2
GOWN STRL REUS W/ TWL LRG LVL3 (GOWN DISPOSABLE) ×6 IMPLANT
GOWN STRL REUS W/TWL LRG LVL3 (GOWN DISPOSABLE) ×8
HANDLE SUCTION POOLE (INSTRUMENTS) IMPLANT
IRRIG SUCT STRYKERFLOW 2 WTIP (MISCELLANEOUS) ×2
IRRIGATION SUCT STRKRFLW 2 WTP (MISCELLANEOUS) IMPLANT
KIT TURNOVER KIT A (KITS) IMPLANT
NS IRRIG 1000ML POUR BTL (IV SOLUTION) ×6 IMPLANT
PACK COLON (CUSTOM PROCEDURE TRAY) ×3 IMPLANT
PENCIL SMOKE EVACUATOR (MISCELLANEOUS) IMPLANT
POUCH OSTOMY FLEX CONVEX 1-1/2 (OSTOMY) IMPLANT
SET TUBE SMOKE EVAC HIGH FLOW (TUBING) ×3 IMPLANT
SOL ANTI FOG 6CC (MISCELLANEOUS) ×3 IMPLANT
SOLUTION ANTI FOG 6CC (MISCELLANEOUS) ×2
SPONGE DRAIN TRACH 4X4 STRL 2S (GAUZE/BANDAGES/DRESSINGS) IMPLANT
STAPLER VISISTAT 35W (STAPLE) ×3 IMPLANT
SUCTION POOLE HANDLE (INSTRUMENTS) ×2
SUT ETHILON 2 0 PS N (SUTURE) IMPLANT
SUT MNCRL AB 4-0 PS2 18 (SUTURE) IMPLANT
SUT PDS AB 1 TP1 96 (SUTURE) ×6 IMPLANT
SUT SILK 2 0 SH CR/8 (SUTURE) ×3 IMPLANT
SUT VIC AB 2-0 SH 18 (SUTURE) IMPLANT
SUT VIC AB 3-0 SH 8-18 (SUTURE) IMPLANT
SUT VICRYL 0 UR6 27IN ABS (SUTURE) IMPLANT
TROCAR BALLN 12MMX100 BLUNT (TROCAR) ×3 IMPLANT
TROCAR Z-THREAD FIOS 12X100MM (TROCAR) IMPLANT
TUBING CONNECTING 10 (TUBING) IMPLANT
YANKAUER SUCT BULB TIP NO VENT (SUCTIONS) ×3 IMPLANT

## 2022-04-18 NOTE — Progress Notes (Signed)
PT Cancellation Note  Patient Details Name: Angela Mcpherson MRN: [id number] DOB: Mar 23, 1961   Cancelled Treatment:    Reason Eval/Treat Not Completed: Patient at procedure or test/unavailable surgery   Kati L Payson 04/18/2022, 10:08 AM Arlyce Dice, DPT Physical Therapist Acute Rehabilitation Services Preferred contact method: Secure Chat Weekend Pager Only: [phone] Office: [phone]

## 2022-04-18 NOTE — Progress Notes (Signed)
Pt off unit to preop at this time.

## 2022-04-18 NOTE — Progress Notes (Signed)
PHARMACY - TOTAL PARENTERAL NUTRITION CONSULT NOTE   Indication: Prolonged ileus  Patient Measurements: Height: '5\' 4"'$  (162.6 cm) Weight: 62.7 kg (138 lb 3.7 oz) IBW/kg (Calculated) : 54.7 TPN AdjBW (KG): 49.3 Body mass index is 23.73 kg/m. Usual Weight:   Assessment:  61 y.o. 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  8/22 CT shows new left sided abscess, not present on admission; on 8/23, CCS and IR consulted and underwent perc drain placement. On 8/24, AXR showed dilated SB and NGT placed on 8/26.  Patient removed NGT 8/27 and unable to replace at this time. On 8/27, Pharmacy consulted to dose TPN.  Glucose / Insulin: No hx DM. Goal CBGs 100-150.  - CBG range 150-204 - 8 units sSSI required - Dexamethasone '8mg'$  IV daily x 10 days started 8/29 Electrolytes: Na and Phos low, Corr Ca WNL. Renal: BUN/SCr WNL Hepatic: AST/ALT, AlkPhos WNL, Tbili elevated 1.4 but overall improving Triglycerides: WNL 8/28 and 8/31 Intake / Output; urine x 4 occurences, stools x 3; 80cc NGT, 7cc drain output  MIVF: NS at 10/hr, octreotide drip at 65m/hr started 8/29 GI Imaging: as above GI Surgeries / Procedures:  8/28: s/p L transgluteal drain placement by IR  Central access: PICC TPN start date: 8/28  Nutritional Goals: Goal TPN rate is 75 mL/hr (provides 90 g of protein and 1818 kcals per day)  RD Assessment: 8/24 Estimated Needs Total Energy Estimated Needs: 1700-2000 kcal Total Protein Estimated Needs: 85-95 grams Total Fluid Estimated Needs: >/= 2 L/day  Current Nutrition:  NPO TPN at 75 ml/hr  Plan:  Per MD - concentrate TPN due to fluid overload At 18:00 tonight: Decrease TPN to 552mhr and adjust concentration of macronutrient to provide 92g protein, 1663 kcal per day   Electrolytes in TPN:  Na 10062mL (increase),  K 80m42m,  Ca 5mEq56m  Mg  10mEq97mincrease),  Phos 25mmol57mincrease).  Cl:Ac 1:2 Add standard MVI and trace elements to TPN Adjust moderate q6h SSI and adjust as needed  Add 100 mg Thiamine x 5 days given refeeding syndrome risk per RD recommendations (started 8/29) [facility] at [facility] at [facility]  Monitor TPN labs on Mon/Thurs, CMET/Mg/Phos inAM  [place], BCPS Pharmacy: [phone],7:05 AM

## 2022-04-18 NOTE — Anesthesia Postprocedure Evaluation (Signed)
Anesthesia Post Note  Patient: Angela Mcpherson  Procedure(s) Performed: OPEN TRANSVERSE LOOP COLOSTOMY and GASTROSTOMY TUBE PLACEMENT (Abdomen) LAPAROSCOPY DIAGNOSTIC (Abdomen)     Patient location during evaluation: PACU Anesthesia Type: General Level of consciousness: awake and alert Pain management: pain level controlled Vital Signs Assessment: post-procedure vital signs reviewed and stable Respiratory status: spontaneous breathing, nonlabored ventilation, respiratory function stable and patient connected to nasal cannula oxygen Cardiovascular status: blood pressure returned to baseline and stable Postop Assessment: no apparent nausea or vomiting Anesthetic complications: no   No notable events documented.  Last Vitals:  Vitals:   04/18/22 1455 04/18/22 1459  BP: (!) 157/92 (!) 157/92  Pulse: (!) 104 (!) 102  Resp: 18 20  Temp: (!) 36.4 C (!) 36.4 C  SpO2:  94%    Last Pain:  Vitals:   04/18/22 1459  TempSrc: Oral  PainSc:                  Santa Lighter

## 2022-04-18 NOTE — Anesthesia Preprocedure Evaluation (Addendum)
Anesthesia Evaluation  Patient identified by MRN, date of birth, ID band Patient awake    Reviewed: Allergy & Precautions, NPO status , Patient's Chart, lab work & pertinent test results  Airway Mallampati: II  TM Distance: <3 FB Neck ROM: Full    Dental  (+) Teeth Intact, Dental Advisory Given, Caps   Pulmonary Current Smoker and Patient abstained from smoking., former smoker,    Pulmonary exam normal breath sounds clear to auscultation       Cardiovascular Normal cardiovascular exam+ Valvular Problems/Murmurs MVP  Rhythm:Regular Rate:Normal     Neuro/Psych negative neurological ROS  negative psych ROS   GI/Hepatic (+) Hepatitis -, Cobstructing rectal cancer with metastatic disease   Endo/Other  negative endocrine ROS  Renal/GU negative Renal ROS     Musculoskeletal negative musculoskeletal ROS (+)   Abdominal   Peds  Hematology  (+) Blood dyscrasia (Eliquis; thombocytopenia), anemia ,   Anesthesia Other Findings Day of surgery medications reviewed with the patient.  Reproductive/Obstetrics                           Anesthesia Physical Anesthesia Plan  ASA: 3  Anesthesia Plan: General   Post-op Pain Management: Ofirmev IV (intra-op)*   Induction: Intravenous, Cricoid pressure planned and Rapid sequence  PONV Risk Score and Plan: 3 and Midazolam, Ondansetron and Dexamethasone  Airway Management Planned: Oral ETT  Additional Equipment:   Intra-op Plan:   Post-operative Plan: Possible Post-op intubation/ventilation  Informed Consent: I have reviewed the patients History and Physical, chart, labs and discussed the procedure including the risks, benefits and alternatives for the proposed anesthesia with the patient or authorized representative who has indicated his/her understanding and acceptance.   Patient has DNR.  Discussed DNR with patient and Suspend DNR.   Dental advisory  given  Plan Discussed with: CRNA  Anesthesia Plan Comments: (2nd PIV after induction )       Anesthesia Quick Evaluation

## 2022-04-18 NOTE — Op Note (Signed)
Preop diagnosis: Metastatic rectal cancer with complete obstruction Postop diagnosis: Same Procedure performed: Diagnostic laparoscopy converted to exploratory laparotomy, placement of gastrostomy tube, diverting transverse loop colostomy Surgeon:Jaiyden Laur K Hesston Hitchens Assistant: Dr. 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  She has been on chemotherapy for the last year.  She presented last week with a pelvic abscess.  This was likely due to perforation of her cancer.  A percutaneous drain was placed and the abscess is much smaller.  However the patient continues to be obstructed.  She has had a nasogastric tube for several days.  Nonoperative attempts were made to provide some palliation.  However these were unsuccessful.  We recommended gastrostomy tube placement as well as a diverting loop colostomy.  She presents now for surgery.  Description of procedure: The patient is brought to the operating room and placed in the supine position on the operating room table.  After an adequate level general anesthesia was obtained, her abdomen was prepped with ChloraPrep and draped in sterile fashion.  Her abdomen is quite distended.  We injected local anesthetic near the umbilicus.  I made a 1 cm vertical incision just below the umbilicus.  We dissected down to the fascia.  We entered the peritoneal cavity carefully.  The patient has copious ascites.  We suctioned out 2 L of ascites from the abdomen.  A stay suture of 0 Vicryl was placed around the fascial opening.  The Hassan cannula was inserted and secured with the stay suture.  Pneumoperitoneum was obtained by insufflating CO2 maintaining a maximum pressure of 15 mmHg.  The laparoscope was inserted.  Quick visual examination of the peritoneal cavity shows massively dilated small bowel that precluded any further laparoscopic intervention.  I did not feel  that it was safe to continue with minimally invasive techniques.  Therefore we decided convert to open.  We removed the trocar released insufflation.  We made a vertical midline incision including our umbilical incision.  We entered the peritoneal cavity.  The small bowel was quite distended.  We examined the small bowel from ligament of Treitz down to the ileocecal valve.  There is no sign of metastases on the small bowel.  The small bowel was thin-walled but quite distended.  We milked the contents retrograde until we could decompress the small bowel within the nasogastric tube.  The liver is very firm and nodular, consistent with her diagnosis of cirrhosis.  The stomach was examined.  The nasogastric tube was palpated within the stomach.  The stomach grossly appears normal.  We selected a point several fingerbreadths below the left costal margin.  I have made a 1 cm incision here.  We brought a 62 [place] feeding tube through the stab incision.  I created a pursestring suture using 2-0 silk on the anterior surface of the stomach.  Cautery was used to create a small gastrotomy.  The feeding tube was inserted into the stomach.  The balloon was inflated.  The pursestring suture was used to tighten the stomach around the tube.  We then pulled the stomach up to the anterior abdominal wall.  The flange of the gastrostomy tube is at 3 cm.  Several stay sutures of 2-0 silk were used to pex the stomach up to the anterior abdominal wall.  The gastrostomy tube flushed and drained easily.  We then examined the transverse colon.  The colon is fairly redundant.  It is  mildly distended.  We selected a point that had previously been marked on the patient's right side by the ostomy nurses.  We cut a small paddle of skin.  We dissected down to the fascia with cautery.  The fascia was incised in a cruciate fashion.  We dilated this fascial opening up to 3 fingers.  We then brought the proximal transverse colon up through this  opening.  The previous small paddle of skin was brought underneath the loop colostomy as a small skin bridge.  This was secured to the skin on the opposite side of the opening with interrupted 2-0 Vicryl.  We irrigated the abdomen thoroughly and inspected for hemostasis.  The fascia was reapproximated with double-stranded #1 PDS suture.  We instilled a mixture of Exparel and 0.25% Marcaine into the fascia and subcutaneous tissues.  The skin was closed in subcuticular fashion with 4-0 Monocryl.  Dermabond was applied to seal the skin incision.  I then matured the loop colostomy by making a small colotomy with the cautery.  We matured the colostomy with multiple interrupted 3-0 Vicryl sutures.  An ostomy appliance was cut to fit.  A honeycomb dressing was placed over the midline incision.  The gastrostomy tube was placed to straight drain by connecting to a Foley bag.  The patient was then extubated and brought to the recovery room in stable condition.  All sponge, instrument, and needle counts are correct.  [name], MD, [facility] Surgery  General Surgery   04/18/2022 1:13 PM

## 2022-04-18 NOTE — Progress Notes (Signed)
[facility] Progress Note  Patient: Angela Mcpherson     OJJ:[id number]  DOA: 04/03/2022   PCP: Default, Provider, MD       Brief hospital course:   This is a 61 year old female with rectal cancer that is metastatic to the liver and lung, hep C cirrhosis, aortic mural thrombus who presented to the hospital with abdominal pain and confusion.  Initially she was found to have colitis. She was started on IV antibiotics.  Blood cultures ultimately grew E. coli and despite appropriate antibiotics WBC count continued to rise. 03/2019 CT revealed left-sided pelvic abscess On 8/23 she underwent left transgluteal drain placement for the pelvic abscess 8/24 she had increasing abdominal pain with an x-ray suggestive of an 8/26 NG tube was placed but the patient removed it the following day it was replaced a couple more times but the patient again removed this requiring it to be replaced a fourth time 8/29-started on steroids and octreotide  Subjective:  Elevated after surgery.  She appears to be comfortable and states that she she is in no pain.  Assessment and Plan: Principal Problem: Severe Sepsis-   E coli and citrobacter bacteremia -Currently receiving Zosyn - Appreciate ID consult  Active Problems:  Metastatic colorectal cancer with pelvic abscess suspected to be secondary to the cancer - has undergone a diverting loop colostomy and placement of a PEG tube today for palliative purposes-patient was initially going to be laparoscopic but had to be converted to an exploratory lap  Anasarca/ ascites with severe hypoalbuminemia - severe malnutrition - have spoken with pharmacy to reduce fluids in TPN -Start low-dose Lasix    Acute metabolic encephalopathy - on and off disorientation continues  Macrocytic anemia - Vitamin B12 was 1530 and folate was greater than 40 on 04/09/2022  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) Thrombocytopenia (HCC) Elevated INR -Eliquis on hold    Acute  urinary retention -Has resolved    Hepatitis C cirrhosis and Splenomegaly   DVT prophylaxis:  Place and maintain sequential compression device Start: 04/04/22 0946     Code Status: DNR  Consultants: General surgery, palliative care Level of Care: Level of care: Med-Surg Disposition Plan:  Status is: Inpatient Remains inpatient appropriate because: Ongoing active IV treatments  Objective:   Vitals:   04/18/22 1415 04/18/22 1430 04/18/22 1455 04/18/22 1459  BP: (!) 155/87 (!) 153/90 (!) 157/92 (!) 157/92  Pulse: 100 99 (!) 104 (!) 102  Resp: (!) '21 20 18 20  '$ Temp:   (!) 97.5 F (36.4 C) (!) 97.5 F (36.4 C)  TempSrc:   Oral Oral  SpO2: 96% 96%  94%  Weight:      Height:       Filed Weights   04/04/22 0847 04/10/22 1247  Weight: 49.3 kg 62.7 kg   Exam: General exam: Appears comfortable  HEENT: PERRLA, oral mucosa moist, no sclera icterus or thrush Respiratory system: Clear to auscultation. Respiratory effort normal. Cardiovascular system: S1 & S2 heard, regular rate and rhythm Gastrointestinal system: Abdomen soft, colostomy and PEG present- dressing not opened Central nervous system: Alert and oriented to place. No focal neurological deficits. Extremities: No cyanosis, clubbing - + anasarca Psychiatry:  Mood & affect appropriate.    Imaging and lab data was personally reviewed    CBC: Recent Labs  Lab 04/14/22 0403 04/15/22 0325 04/16/22 0357 04/17/22 0317 04/18/22 0354  WBC 11.9* 10.3 7.3 7.0 8.1  NEUTROABS  --   --   --  5.9  --  HGB 9.7* 9.5* 9.8* 9.0* 9.3*  HCT 29.0* 28.8* 30.5* 27.4* 28.4*  MCV 100.7* 102.9* 105.9* 105.0* 105.6*  PLT 132* 102* 87* 78* 69*    Basic Metabolic Panel: Recent Labs  Lab 04/14/22 0403 04/15/22 0325 04/16/22 0357 04/17/22 0317 04/18/22 0354  NA 144 142 139 137 134*  K 4.0 3.2* 3.9 3.8 4.1  CL 117* 116* 111 105 102  CO2 '22 23 24 27 28  '$ GLUCOSE 112* 162* 176* 152* 165*  BUN '16 15 15 15 17  '$ CREATININE 0.41* 0.41*  0.43* 0.42* 0.41*  CALCIUM 8.5* 8.2* 8.2* 8.0* 7.7*  MG 1.7 1.7 1.8 1.6* 1.7  PHOS 2.8 2.0* 3.1 2.4* 2.4*    GFR: Estimated Creatinine Clearance: 64.6 mL/min (A) (by C-G formula based on SCr of 0.41 mg/dL (L)).  Scheduled Meds:  sodium chloride   Intravenous Once   sodium chloride   Intravenous Once   Chlorhexidine Gluconate Cloth  6 each Topical Q0600   dexamethasone (DECADRON) injection  8 mg Intravenous Daily   hydrocortisone  25 mg Rectal BID   insulin aspart  0-15 Units Subcutaneous Q6H   Continuous Infusions:  sodium chloride 10 mL/hr at 04/18/22 1058   octreotide (SANDOSTATIN) 500 mcg in sodium chloride 0.9 % 250 mL (2 mcg/mL) infusion Stopped (04/18/22 1154)   piperacillin-tazobactam (ZOSYN)  IV 3.375 g (04/18/22 0004)   TPN ADULT (ION) 75 mL/hr at 04/17/22 1741   TPN ADULT (ION)       LOS: 15 days   Author: Debbe Odea  04/18/2022 4:39 PM

## 2022-04-18 NOTE — Anesthesia Procedure Notes (Signed)
Procedure Name: Intubation Date/Time: 04/18/2022 11:10 AM  Performed by: Eben Burow, CRNAPre-anesthesia Checklist: Patient identified, Emergency Drugs available, Suction available, Patient being monitored and Timeout performed Patient Re-evaluated:Patient Re-evaluated prior to induction Oxygen Delivery Method: Circle system utilized Preoxygenation: Pre-oxygenation with 100% oxygen Induction Type: IV induction and Rapid sequence Laryngoscope Size: Mac and 4 Grade View: Grade I Tube type: Oral Tube size: 7.0 mm Number of attempts: 1 Airway Equipment and Method: Stylet Placement Confirmation: ETT inserted through vocal cords under direct vision, positive ETCO2 and breath sounds checked- equal and bilateral Secured at: 21 cm Tube secured with: Tape Dental Injury: Teeth and Oropharynx as per pre-operative assessment

## 2022-04-18 NOTE — Interval H&P Note (Signed)
History and Physical Interval Note:  04/18/2022 10:06 AM  Angela Mcpherson  has presented today for surgery, with the diagnosis of obstructing rectal cancer with metastatic disease.  The various methods of treatment have been discussed with the patient and family. After consideration of risks, benefits and other options for treatment, the patient has consented to  Procedure(s): LAPAROSCOPIC DIVERTING LOOP COLOSTOMY, LAP-ASSISTED GASTROSTOMY TUBE PLACEMENT (N/A) as a surgical intervention.  The patient's history has been reviewed, patient examined, no change in status, stable for surgery.  I have reviewed the patient's chart and labs.  Questions were answered to the patient's satisfaction.     Maia Petties

## 2022-04-18 NOTE — Progress Notes (Signed)
Angela Mcpherson   DOB:07-06-1961   KN#:[id number]   FXT#:[id number]  Oncology follow up   Subjective: Pt underwent surgery today, is awake and alert when I saw her, and oriented, but appears to be slow when she answers questions. Pain is controlled, no nausea   Objective:  Vitals:   04/18/22 1455 04/18/22 1459  BP: (!) 157/92 (!) 157/92  Pulse: (!) 104 (!) 102  Resp: 18 20  Temp: (!) 97.5 F (36.4 C) (!) 97.5 F (36.4 C)  SpO2:  94%    Body mass index is 23.73 kg/m.  Intake/Output Summary (Last 24 hours) at 04/18/2022 1709 Last data filed at 04/18/2022 1455 Gross per 24 hour  Intake 4709.15 ml  Output 2237 ml  Net 2472.15 ml     Sclerae unicteric  (+) significant anasarca   Abdomen soft, mildly distended, (+) PEG and new ostomy   Neuro nonfocal    CBG (last 3)  Recent Labs    04/18/22 0009 04/18/22 0525 04/18/22 1652  GLUCAP 161* 150* 222*     Labs:   Urine Studies No results for input(s): "UHGB", "CRYS" in the last 72 hours.  Invalid input(s): "UACOL", "UAPR", "USPG", "UPH", "UTP", "UGL", "UKET", "UBIL", "UNIT", "UROB", "ULEU", "UEPI", "UWBC", "URBC", "UBAC", "CAST", "UCOM", "BILUA"  Basic Metabolic Panel: Recent Labs  Lab 04/14/22 0403 04/15/22 0325 04/16/22 0357 04/17/22 0317 04/18/22 0354  NA 144 142 139 137 134*  K 4.0 3.2* 3.9 3.8 4.1  CL 117* 116* 111 105 102  CO2 '22 23 24 27 28  '$ GLUCOSE 112* 162* 176* 152* 165*  BUN '16 15 15 15 17  '$ CREATININE 0.41* 0.41* 0.43* 0.42* 0.41*  CALCIUM 8.5* 8.2* 8.2* 8.0* 7.7*  MG 1.7 1.7 1.8 1.6* 1.7  PHOS 2.8 2.0* 3.1 2.4* 2.4*   GFR Estimated Creatinine Clearance: 64.6 mL/min (A) (by C-G formula based on SCr of 0.41 mg/dL (L)). Liver Function Tests: Recent Labs  Lab 04/14/22 0403 04/15/22 0325 04/16/22 0357 04/17/22 0317 04/18/22 0354  AST '17 18 22 22 '$ 40  ALT '13 13 15 15 24  '$ ALKPHOS 72 66 71 62 64  BILITOT 1.8* 1.7* 1.3* 1.3* 1.4*  PROT 5.4* 5.3* 5.5* 4.9* 5.1*  ALBUMIN 1.9* 1.8* 1.9* 1.7* 1.8*   No  results for input(s): "LIPASE", "AMYLASE" in the last 168 hours.  Recent Labs  Lab 04/16/22 0752  AMMONIA 26    Coagulation profile Recent Labs  Lab 04/14/22 0403 04/15/22 0325 04/16/22 0357 04/17/22 0317 04/18/22 0354  INR 2.1* 2.0* 1.7* 1.9* 1.8*    CBC: Recent Labs  Lab 04/14/22 0403 04/15/22 0325 04/16/22 0357 04/17/22 0317 04/18/22 0354  WBC 11.9* 10.3 7.3 7.0 8.1  NEUTROABS  --   --   --  5.9  --   HGB 9.7* 9.5* 9.8* 9.0* 9.3*  HCT 29.0* 28.8* 30.5* 27.4* 28.4*  MCV 100.7* 102.9* 105.9* 105.0* 105.6*  PLT 132* 102* 87* 78* 69*   Cardiac Enzymes: No results for input(s): "CKTOTAL", "CKMB", "CKMBINDEX", "TROPONINI" in the last 168 hours. BNP: Invalid input(s): "POCBNP" CBG: Recent Labs  Lab 04/17/22 1128 04/17/22 1647 04/18/22 0009 04/18/22 0525 04/18/22 1652  GLUCAP 154* 204* 161* 150* 222*   D-Dimer No results for input(s): "DDIMER" in the last 72 hours. Hgb A1c No results for input(s): "HGBA1C" in the last 72 hours. Lipid Profile Recent Labs    04/17/22 0500  TRIG 54   Thyroid function studies No results for input(s): "TSH", "T4TOTAL", "T3FREE", "THYROIDAB" in the last 72 hours.  Invalid input(s): "FREET3" Anemia work up No results for input(s): "VITAMINB12", "FOLATE", "FERRITIN", "TIBC", "IRON", "RETICCTPCT" in the last 72 hours.  Microbiology Recent Results (from the past 240 hour(s))  Aerobic/Anaerobic Culture w Gram Stain (surgical/deep wound)     Status: None   Collection Time: 04/09/22  4:24 PM   Specimen: Abscess  Result Value Ref Range Status   Specimen Description   Final    ABSCESS Performed at [facility] [address], [place], [place] [address]    Special Requests ABDOMEN  Final   Gram Stain   Final    ABUNDANT WBC PRESENT, PREDOMINANTLY PMN ABUNDANT GRAM NEGATIVE RODS FEW GRAM POSITIVE COCCI IN PAIRS Performed at [facility], [place] [address], [place], [place] [address]    Culture   Final     ABUNDANT ESCHERICHIA COLI MODERATE PREVOTELLA DENTICOLA BETA LACTAMASE POSITIVE RARE PSEUDOMONAS AERUGINOSA MODERATE ENTEROCOCCUS FAECALIS    Report Status 04/15/2022 FINAL  Final   Organism ID, Bacteria ESCHERICHIA COLI  Final   Organism ID, Bacteria PSEUDOMONAS AERUGINOSA  Final   Organism ID, Bacteria ENTEROCOCCUS FAECALIS  Final      Susceptibility   Escherichia coli - MIC*    AMPICILLIN <=2 SENSITIVE Sensitive     CEFAZOLIN <=4 SENSITIVE Sensitive     CEFEPIME <=0.12 SENSITIVE Sensitive     CEFTAZIDIME <=1 SENSITIVE Sensitive     CEFTRIAXONE <=0.25 SENSITIVE Sensitive     CIPROFLOXACIN <=0.25 SENSITIVE Sensitive     GENTAMICIN <=1 SENSITIVE Sensitive     IMIPENEM <=0.25 SENSITIVE Sensitive     TRIMETH/SULFA <=20 SENSITIVE Sensitive     AMPICILLIN/SULBACTAM <=2 SENSITIVE Sensitive     PIP/TAZO <=4 SENSITIVE Sensitive     * ABUNDANT ESCHERICHIA COLI   Enterococcus faecalis - MIC*    AMPICILLIN <=2 SENSITIVE Sensitive     VANCOMYCIN 1 SENSITIVE Sensitive     GENTAMICIN SYNERGY SENSITIVE Sensitive     * MODERATE ENTEROCOCCUS FAECALIS   Pseudomonas aeruginosa - MIC*    CEFTAZIDIME 2 SENSITIVE Sensitive     CIPROFLOXACIN 0.5 SENSITIVE Sensitive     GENTAMICIN <=1 SENSITIVE Sensitive     IMIPENEM 1 SENSITIVE Sensitive     PIP/TAZO <=4 SENSITIVE Sensitive     CEFEPIME 2 SENSITIVE Sensitive     * RARE PSEUDOMONAS AERUGINOSA      Studies:  CT ABDOMEN PELVIS W CONTRAST  Result Date: 04/17/2022 CLINICAL DATA:  Bowel obstruction/ileus. Left pelvic abscess with drain placed. History of colorectal cancer. EXAM: CT ABDOMEN AND PELVIS WITH CONTRAST TECHNIQUE: Multidetector CT imaging of the abdomen and pelvis was performed using the standard protocol following bolus administration of intravenous contrast. RADIATION DOSE REDUCTION: This exam was performed according to the departmental dose-optimization program which includes automated exposure control, adjustment of the mA and/or  kV according to patient size and/or use of iterative reconstruction technique. CONTRAST:  175m OMNIPAQUE IOHEXOL 300 MG/ML  SOLN COMPARISON:  04/08/2022. FINDINGS: Lower chest: There are small bilateral pleural effusions with consolidation at the lung bases. The heart is normal in size with no pericardial effusion. The distal tip of a central venous catheter terminates in the right atrium. Scattered coronary artery calcifications are noted. Hepatobiliary: The liver is small and has a slightly nodular contour suggesting underlying cirrhosis. No focal abnormality is seen. Gallbladder is without stones. Biliary ductal dilatation. Pancreas: Unremarkable. No pancreatic ductal dilatation or surrounding inflammatory changes. Spleen: The spleen is enlarged measuring 14.3 cm in length. Adrenals/Urinary Tract: No adrenal nodule  or mass. The kidneys enhance symmetrically. No renal calculus or hydronephrosis. The bladder is unremarkable. Stomach/Bowel: The stomach is nondistended. An enteric tube loops in the gastric body and terminates at the fundus. Multiple distended loops of small bowel are noted in the abdomen and pelvis measuring up to 4.0 cm in diameter. No definite transition point is seen. Scattered diverticula are present along the colon without evidence of diverticulitis. Air-fluid levels are present in the colon. The appendix is normal in caliber and contains calcifications. No free air or pneumatosis. Vascular/Lymphatic: Aortic atherosclerosis. Enlarged lymph node is present in the obturator space on the left measuring 3.6 x 2.0 cm. Reproductive: Uterus and bilateral adnexa are unremarkable. Other: Moderate ascites. A percutaneous drain terminates in the pelvis on the left. No residual abscess is seen. Musculoskeletal: Diffuse anasarca.  No acute osseous abnormality. IMPRESSION: 1. Multiple loops of dilated small bowel in the abdomen, slightly increased from the prior exam. Findings may represent ileus versus  small-bowel obstruction. No free air. 2. Left pelvic drain with no evidence of residual abscess. 3. Moderate ascites. 4. Small bilateral pleural effusions with consolidation at the lung bases. 5. Small liver with subtle nodularity inferiorly, possible underlying cirrhosis. 6. Splenomegaly. 7. Stable enlarged lymph node in the obturator space on the left. 8. Anasarca. 9. Aortic atherosclerosis. Electronically Signed   By: Brett Fairy M.D.   On: 04/17/2022 01:37    Assessment: 61 y.o. female   Sepsis secondary to Ecoli colitis and bacteremia Acute colitis with sigmoid colon perforation and bowel obstruction, s/p diverting colostomy and PEG placement Metabolic encephalopathy due to sepsis, fluctuating  Left-sided colon cancer, chemotherapy on hold at this point  Normocytic anemia and thrombocytopenia, due to underlying malignancy and chemotherapy Liver cirrhosis, HCV infection   Plan:  -Unfortunately she developed bowel obstruction and required tipredane colostomy and venting PEG tube replacement. -She is very malnourished with anasarca, on TPN now. -Her prognosis is very poor, I appreciate Perative care Dr. Hilma Favors seeing her, CODE STATUS has been changed to DNR. -We will see how she recovers in the next week.  She is certainly not a candidate for more chemotherapy in near future and I suspect she unlikely will ever be a candidate for chemo.  -I am not sure if palliative radiation would help. She has diverging colostomy, RT to the primary tumor in rectosigmoid colon, may make the perforation even worse.  -I think hospice care is very reasonable in near future, especially if she does not recover well  -I called her husband, he remains hopeful, that she will recover well enough to gain some quality of life.  But he is agreeable and open to further discussion of goals of care depends on how she recovers. I will f/u  and meet him in mid or late next week.    Truitt Merle, MD 04/18/2022

## 2022-04-18 NOTE — Transfer of Care (Signed)
Immediate Anesthesia Transfer of Care Note  Patient: Angela Mcpherson  Procedure(s) Performed: OPEN TRANSVERSE LOOP COLOSTOMY and GASTROSTOMY TUBE PLACEMENT (Abdomen) LAPAROSCOPY DIAGNOSTIC (Abdomen)  Patient Location: PACU  Anesthesia Type:General  Level of Consciousness: awake, drowsy and patient cooperative  Airway & Oxygen Therapy: Patient Spontanous Breathing and Patient connected to face mask oxygen  Post-op Assessment: Report given to RN and Post -op Vital signs reviewed and stable  Post vital signs: Reviewed and stable  Last Vitals:  Vitals Value Taken Time  BP 153/94 04/18/22 1315  Temp    Pulse 104 04/18/22 1316  Resp 21 04/18/22 1316  SpO2 92 % 04/18/22 1316  Vitals shown include unvalidated device data.  Last Pain:  Vitals:   04/18/22 0841  TempSrc: Oral  PainSc:       Patients Stated Pain Goal: 0 (38/33/38 3291)  Complications: No notable events documented.

## 2022-04-19 DIAGNOSIS — N739 Female pelvic inflammatory disease, unspecified: Secondary | ICD-10-CM | POA: Diagnosis not present

## 2022-04-19 DIAGNOSIS — C19 Malignant neoplasm of rectosigmoid junction: Secondary | ICD-10-CM | POA: Diagnosis not present

## 2022-04-19 DIAGNOSIS — R7881 Bacteremia: Secondary | ICD-10-CM | POA: Diagnosis not present

## 2022-04-19 DIAGNOSIS — K56600 Partial intestinal obstruction, unspecified as to cause: Secondary | ICD-10-CM | POA: Diagnosis not present

## 2022-04-19 LAB — PREPARE FRESH FROZEN PLASMA: Unit division: 0

## 2022-04-19 LAB — BPAM FFP
Blood Product Expiration Date: 202309052359
Blood Product Expiration Date: 202309052359
Blood Product Expiration Date: 202309062359
Blood Product Expiration Date: 202309062359
ISSUE DATE / TIME: 202309010414
ISSUE DATE / TIME: 202309010549
ISSUE DATE / TIME: 202309011237
ISSUE DATE / TIME: 202309011237
Unit Type and Rh: 9500
Unit Type and Rh: 9500
Unit Type and Rh: 5100
Unit Type and Rh: 5100

## 2022-04-19 LAB — CBC
HCT: 28.2 % — ABNORMAL LOW (ref 36.0–46.0)
Hemoglobin: 9.2 g/dL — ABNORMAL LOW (ref 12.0–15.0)
MCH: 34.5 pg — ABNORMAL HIGH (ref 26.0–34.0)
MCHC: 32.6 g/dL (ref 30.0–36.0)
MCV: 105.6 fL — ABNORMAL HIGH (ref 80.0–100.0)
Platelets: 74 10*3/uL — ABNORMAL LOW (ref 150–400)
RBC: 2.67 MIL/uL — ABNORMAL LOW (ref 3.87–5.11)
RDW: 23.5 % — ABNORMAL HIGH (ref 11.5–15.5)
WBC: 10.7 10*3/uL — ABNORMAL HIGH (ref 4.0–10.5)
nRBC: 0 % (ref 0.0–0.2)

## 2022-04-19 LAB — COMPREHENSIVE METABOLIC PANEL
ALT: 29 U/L (ref 0–44)
AST: 40 U/L (ref 15–41)
Albumin: 2 g/dL — ABNORMAL LOW (ref 3.5–5.0)
Alkaline Phosphatase: 66 U/L (ref 38–126)
Anion gap: 3 — ABNORMAL LOW (ref 5–15)
BUN: 15 mg/dL (ref 6–20)
CO2: 34 mmol/L — ABNORMAL HIGH (ref 22–32)
Calcium: 8 mg/dL — ABNORMAL LOW (ref 8.9–10.3)
Chloride: 97 mmol/L — ABNORMAL LOW (ref 98–111)
Creatinine, Ser: 0.39 mg/dL — ABNORMAL LOW (ref 0.44–1.00)
GFR, Estimated: >60 mL/min (ref >60)
Glucose, Bld: 143 mg/dL — ABNORMAL HIGH (ref 70–99)
Potassium: 4.2 mmol/L (ref 3.5–5.1)
Sodium: 134 mmol/L — ABNORMAL LOW (ref 135–145)
Total Bilirubin: 1.4 mg/dL — ABNORMAL HIGH (ref 0.3–1.2)
Total Protein: 5.1 g/dL — ABNORMAL LOW (ref 6.5–8.1)

## 2022-04-19 LAB — PREPARE PLATELET PHERESIS: Unit division: 0

## 2022-04-19 LAB — BPAM PLATELET PHERESIS
Blood Product Expiration Date: 202309022359
ISSUE DATE / TIME: 202309010817
Unit Type and Rh: 7300

## 2022-04-19 LAB — PHOSPHORUS: Phosphorus: 2.4 mg/dL — ABNORMAL LOW (ref 2.5–4.6)

## 2022-04-19 LAB — MAGNESIUM: Magnesium: 1.5 mg/dL — ABNORMAL LOW (ref 1.7–2.4)

## 2022-04-19 LAB — GLUCOSE, CAPILLARY: Glucose-Capillary: 137 mg/dL — ABNORMAL HIGH (ref 70–99)

## 2022-04-19 NOTE — Progress Notes (Signed)
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  Initially she was found to have colitis. She was started on IV antibiotics.  Blood cultures ultimately grew E. coli and despite appropriate antibiotics WBC count continued to rise. 03/2019 CT revealed left-sided pelvic abscess On 8/23 she underwent left transgluteal drain placement for the pelvic abscess 8/24 she had increasing abdominal pain with an x-ray suggestive of an 8/26 NG tube was placed but the patient removed it the following day it was replaced a couple more times but the patient again removed this requiring it to be replaced a fourth time 8/29-started on steroids and octreotide 9/1: Palliative diverting loop colostomy and placement of a PEG tube  Subjective:  Currently has no complaints of pain or nausea.  Per RNs, she pulled off her dressing in her colostomy bag.  Assessment and Plan: Principal Problem: Severe Sepsis-   E coli and citrobacter bacteremia -Currently receiving Zosyn - Appreciate ID consult  Active Problems:  Metastatic colorectal cancer with pelvic abscess suspected to be secondary to the cancer -9/1: Diverting loop colostomy and placement of a PEG tube for palliative purposes-patient was initially going to be laparoscopic but had to be converted to an exploratory lap - We will DC TPN today - Dr. 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) Thrombocytopenia (HCC) Elevated INR -Eliquis on hold     Acute urinary retention -Has resolved    Hepatitis C cirrhosis and Splenomegaly   DVT prophylaxis:  Place and maintain sequential compression device Start: 04/04/22 0946     Code Status: DNR  Consultants: General surgery, palliative care Level of Care: Level of care: Med-Surg Disposition Plan:  Status is: Inpatient Remains inpatient appropriate because: Requires further goals of care  Objective:   Vitals:   04/18/22 1933 04/18/22 2331 04/19/22 0349 04/19/22 1310  BP: (!) 149/95 (!) 157/107 (!) 154/97 132/83  Pulse: (!) 107 99 89 (!) 101  Resp: '19 19 19 18  '$ Temp: 98.5 F (36.9 C) 98.4 F (36.9 C) 98.6 F (37 C) 98.4 F (36.9 C)  TempSrc: Oral Oral Oral Oral  SpO2: 94% 94% 93% 94%  Weight:      Height:       Filed Weights   04/04/22 0847 04/10/22 1247  Weight: 49.3 kg 62.7 kg   Exam: General exam: Appears comfortable  HEENT: PERRLA, oral mucosa moist, no sclera icterus or thrush Respiratory system: Clear to auscultation. Respiratory effort normal. Cardiovascular system: S1 & S2 heard, regular rate and rhythm Gastrointestinal system: Abdomen soft, colostomy and PEG present- dressing not opened Central nervous system: Alert and oriented to place. No focal neurological deficits. Extremities: No cyanosis, clubbing - + anasarca Psychiatry:  Mood & affect appropriate.    Imaging and lab data was personally reviewed    CBC: Recent Labs  Lab 04/15/22 0325 04/16/22 0357  04/17/22 0317 04/18/22 0354 04/19/22 0314  WBC 10.3 7.3 7.0 8.1 10.7*  NEUTROABS  --   --  5.9  --   --   HGB 9.5* 9.8* 9.0* 9.3* 9.2*  HCT 28.8* 30.5* 27.4* 28.4* 28.2*  MCV 102.9* 105.9* 105.0* 105.6* 105.6*  PLT 102* 87* 78* 69* 74*    Basic Metabolic Panel: Recent Labs  Lab 04/15/22 0325 04/16/22 0357 04/17/22 0317 04/18/22 0354 04/19/22 0314   NA 142 139 137 134* 134*  K 3.2* 3.9 3.8 4.1 4.2  CL 116* 111 105 102 97*  CO2 '23 24 27 28 '$ 34*  GLUCOSE 162* 176* 152* 165* 143*  BUN '15 15 15 17 15  '$ CREATININE 0.41* 0.43* 0.42* 0.41* 0.39*  CALCIUM 8.2* 8.2* 8.0* 7.7* 8.0*  MG 1.7 1.8 1.6* 1.7 1.5*  PHOS 2.0* 3.1 2.4* 2.4* 2.4*    GFR: Estimated Creatinine Clearance: 64.6 mL/min (A) (by C-G formula based on SCr of 0.39 mg/dL (L)).  Scheduled Meds:  sodium chloride   Intravenous Once   sodium chloride   Intravenous Once   Chlorhexidine Gluconate Cloth  6 each Topical Q0600   furosemide  20 mg Intravenous Daily   hydrocortisone  25 mg Rectal BID   Continuous Infusions:  sodium chloride 10 mL/hr at 04/18/22 1058   piperacillin-tazobactam (ZOSYN)  IV 3.375 g (04/19/22 1021)     LOS: 16 days   Author: Debbe Odea  04/19/2022 1:15 PM

## 2022-04-19 NOTE — Progress Notes (Signed)
Patient alert to self. RN informed by NT that the ostomy pouch was not in place and removed by pt including the honeycomb dressing. Ostomy pouch changed and dry gauze dressing was placed on midline incision.

## 2022-04-19 NOTE — Progress Notes (Signed)
1 Day Post-Op   Subjective/Chief Complaint: No complaints. Confused overnight and pulled dressing off. Seems to be oriented today   Objective: Vital signs in last 24 hours: Temp:  [96.5 F (35.8 C)-98.6 F (37 C)] 98.6 F (37 C) (09/02 0349) Pulse Rate:  [89-107] 89 (09/02 0349) Resp:  [18-21] 19 (09/02 0349) BP: (140-157)/(82-107) 154/97 (09/02 0349) SpO2:  [91 %-96 %] 93 % (09/02 0349) Last BM Date : 04/18/22  Intake/Output from previous day: 09/01 0701 - 09/02 0700 In: 3636 [I.V.:2699; Blood:712; IV Piggyback:225] Out: 8563 [Urine:2850; Drains:389; Blood:50] Intake/Output this shift: No intake/output data recorded.  General appearance: alert and cooperative Resp: clear to auscultation bilaterally Cardio: regular rate and rhythm GI: soft, minimal tenderness. Ostomy pink with little output so far  Lab Results:  Recent Labs    04/18/22 0354 04/19/22 0314  WBC 8.1 10.7*  HGB 9.3* 9.2*  HCT 28.4* 28.2*  PLT 69* 74*   BMET Recent Labs    04/18/22 0354 04/19/22 0314  NA 134* 134*  K 4.1 4.2  CL 102 97*  CO2 28 34*  GLUCOSE 165* 143*  BUN 17 15  CREATININE 0.41* 0.39*  CALCIUM 7.7* 8.0*   PT/INR Recent Labs    04/17/22 0317 04/18/22 0354  LABPROT 21.2* 20.9*  INR 1.9* 1.8*   ABG No results for input(s): "PHART", "HCO3" in the last 72 hours.  Invalid input(s): "PCO2", "PO2"  Studies/Results: No results found.  Anti-infectives: Anti-infectives (From admission, onward)    Start     Dose/Rate Route Frequency Ordered Stop   04/14/22 1800  piperacillin-tazobactam (ZOSYN) IVPB 3.375 g        3.375 g 12.5 mL/hr over 240 Minutes Intravenous Every 8 hours 04/14/22 1224     04/08/22 2100  ceFEPIme (MAXIPIME) 2 g in sodium chloride 0.9 % 100 mL IVPB  Status:  Discontinued        2 g 200 mL/hr over 30 Minutes Intravenous Every 12 hours 04/08/22 1453 04/14/22 1224   04/08/22 1600  metroNIDAZOLE (FLAGYL) IVPB 500 mg  Status:  Discontinued        500  mg 100 mL/hr over 60 Minutes Intravenous Every 12 hours 04/08/22 1453 04/14/22 1224   04/08/22 1515  piperacillin-tazobactam (ZOSYN) IVPB 3.375 g  Status:  Discontinued        3.375 g 12.5 mL/hr over 240 Minutes Intravenous Every 8 hours 04/08/22 1428 04/08/22 1447   04/07/22 2000  ceFEPIme (MAXIPIME) 2 g in sodium chloride 0.9 % 100 mL IVPB  Status:  Discontinued        2 g 200 mL/hr over 30 Minutes Intravenous Every 12 hours 04/07/22 1600 04/08/22 1428   04/04/22 0900  cefTRIAXone (ROCEPHIN) 2 g in sodium chloride 0.9 % 100 mL IVPB  Status:  Discontinued        2 g 200 mL/hr over 30 Minutes Intravenous Every 24 hours 04/04/22 0257 04/07/22 1600   04/03/22 1700  ceFEPIme (MAXIPIME) 2 g in sodium chloride 0.9 % 100 mL IVPB  Status:  Discontinued        2 g 200 mL/hr over 30 Minutes Intravenous Every 8 hours 04/03/22 1634 04/04/22 0257   04/03/22 1500  metroNIDAZOLE (FLAGYL) IVPB 500 mg  Status:  Discontinued        500 mg 100 mL/hr over 60 Minutes Intravenous Every 12 hours 04/03/22 1446 04/04/22 0257   04/03/22 1045  ceFEPIme (MAXIPIME) 2 g in sodium chloride 0.9 % 100 mL IVPB  2 g 200 mL/hr over 30 Minutes Intravenous  Once 04/03/22 1031 04/03/22 1429   04/03/22 1045  vancomycin (VANCOCIN) IVPB 1000 mg/200 mL premix        1,000 mg 200 mL/hr over 60 Minutes Intravenous  Once 04/03/22 1031 04/03/22 1241       Assessment/Plan: s/p Procedure(s): OPEN TRANSVERSE LOOP COLOSTOMY and GASTROSTOMY TUBE PLACEMENT (N/A) LAPAROSCOPY DIAGNOSTIC POD 1 Bowel rest until bowel function returns Ambulate Ostomy care Metastatic rectal cancer with obstruction  LOS: 16 days    Angela Mcpherson 04/19/2022

## 2022-04-20 DIAGNOSIS — C19 Malignant neoplasm of rectosigmoid junction: Secondary | ICD-10-CM | POA: Diagnosis not present

## 2022-04-20 DIAGNOSIS — R7881 Bacteremia: Secondary | ICD-10-CM | POA: Diagnosis not present

## 2022-04-20 DIAGNOSIS — K56600 Partial intestinal obstruction, unspecified as to cause: Secondary | ICD-10-CM | POA: Diagnosis not present

## 2022-04-20 DIAGNOSIS — N739 Female pelvic inflammatory disease, unspecified: Secondary | ICD-10-CM | POA: Diagnosis not present

## 2022-04-20 LAB — COMPREHENSIVE METABOLIC PANEL
ALT: 32 U/L (ref 0–44)
AST: 41 U/L (ref 15–41)
Albumin: 2.2 g/dL — ABNORMAL LOW (ref 3.5–5.0)
Alkaline Phosphatase: 77 U/L (ref 38–126)
Anion gap: 7 (ref 5–15)
BUN: 17 mg/dL (ref 6–20)
CO2: 32 mmol/L (ref 22–32)
Calcium: 8 mg/dL — ABNORMAL LOW (ref 8.9–10.3)
Chloride: 94 mmol/L — ABNORMAL LOW (ref 98–111)
Creatinine, Ser: 0.41 mg/dL — ABNORMAL LOW (ref 0.44–1.00)
GFR, Estimated: >60 mL/min (ref >60)
Glucose, Bld: 99 mg/dL (ref 70–99)
Potassium: 4.3 mmol/L (ref 3.5–5.1)
Sodium: 133 mmol/L — ABNORMAL LOW (ref 135–145)
Total Bilirubin: 1.9 mg/dL — ABNORMAL HIGH (ref 0.3–1.2)
Total Protein: 5.2 g/dL — ABNORMAL LOW (ref 6.5–8.1)

## 2022-04-20 LAB — GLUCOSE, CAPILLARY
Glucose-Capillary: 103 mg/dL — ABNORMAL HIGH (ref 70–99)
Glucose-Capillary: 107 mg/dL — ABNORMAL HIGH (ref 70–99)
Glucose-Capillary: 115 mg/dL — ABNORMAL HIGH (ref 70–99)
Glucose-Capillary: 98 mg/dL (ref 70–99)
Glucose-Capillary: 99 mg/dL (ref 70–99)

## 2022-04-20 MED ORDER — HALOPERIDOL LACTATE 5 MG/ML IJ SOLN
1.0000 mg | Freq: Four times a day (QID) | INTRAMUSCULAR | Status: DC | PRN
Start: 2022-04-20 — End: 2022-04-25
  Administered 2022-04-20: 1 mg via INTRAVENOUS
  Filled 2022-04-20: qty 1

## 2022-04-20 MED ORDER — ACETAMINOPHEN 10 MG/ML IV SOLN
1000.0000 mg | Freq: Four times a day (QID) | INTRAVENOUS | Status: AC
  Administered 2022-04-20 – 2022-04-21 (×4): 1000 mg via INTRAVENOUS
  Filled 2022-04-20 (×4): qty 100

## 2022-04-20 MED ORDER — MAGNESIUM SULFATE 2 GM/50ML IV SOLN
2.0000 g | Freq: Once | INTRAVENOUS | Status: AC
  Administered 2022-04-20: 2 g via INTRAVENOUS
  Filled 2022-04-20: qty 50

## 2022-04-20 MED ORDER — MAGIC MOUTHWASH
10.0000 mL | Freq: Four times a day (QID) | ORAL | Status: DC
Start: 2022-04-20 — End: 2022-04-25
  Administered 2022-04-20 – 2022-04-25 (×16): 10 mL via ORAL
  Filled 2022-04-20 (×23): qty 10

## 2022-04-20 NOTE — Progress Notes (Signed)
2 Days Post-Op   Subjective/Chief Complaint: No complaints   Objective: Vital signs in last 24 hours: Temp:  [97.8 F (36.6 C)-98.4 F (36.9 C)] 97.8 F (36.6 C) (09/03 0438) Pulse Rate:  [100-108] 108 (09/03 0438) Resp:  [16-20] 20 (09/03 0438) BP: (102-137)/(74-83) 102/74 (09/03 0438) SpO2:  [92 %-94 %] 93 % (09/03 0438) Last BM Date : 04/18/22  Intake/Output from previous day: 09/02 0701 - 09/03 0700 In: 282.3 [P.O.:120; IV Piggyback:162.3] Out: 1810 [Urine:1600; Drains:210] Intake/Output this shift: No intake/output data recorded.  General appearance: alert and cooperative Resp: clear to auscultation bilaterally Cardio: regular rate and rhythm GI: soft, mild tenderness. Ostomy pink with no output yet  Lab Results:  Recent Labs    04/18/22 0354 04/19/22 0314  WBC 8.1 10.7*  HGB 9.3* 9.2*  HCT 28.4* 28.2*  PLT 69* 74*   BMET Recent Labs    04/19/22 0314 04/20/22 0314  NA 134* 133*  K 4.2 4.3  CL 97* 94*  CO2 34* 32  GLUCOSE 143* 99  BUN 15 17  CREATININE 0.39* 0.41*  CALCIUM 8.0* 8.0*   PT/INR Recent Labs    04/18/22 0354  LABPROT 20.9*  INR 1.8*   ABG No results for input(s): "PHART", "HCO3" in the last 72 hours.  Invalid input(s): "PCO2", "PO2"  Studies/Results: No results found.  Anti-infectives: Anti-infectives (From admission, onward)    Start     Dose/Rate Route Frequency Ordered Stop   04/14/22 1800  piperacillin-tazobactam (ZOSYN) IVPB 3.375 g        3.375 g 12.5 mL/hr over 240 Minutes Intravenous Every 8 hours 04/14/22 1224     04/08/22 2100  ceFEPIme (MAXIPIME) 2 g in sodium chloride 0.9 % 100 mL IVPB  Status:  Discontinued        2 g 200 mL/hr over 30 Minutes Intravenous Every 12 hours 04/08/22 1453 04/14/22 1224   04/08/22 1600  metroNIDAZOLE (FLAGYL) IVPB 500 mg  Status:  Discontinued        500 mg 100 mL/hr over 60 Minutes Intravenous Every 12 hours 04/08/22 1453 04/14/22 1224   04/08/22 1515  piperacillin-tazobactam  (ZOSYN) IVPB 3.375 g  Status:  Discontinued        3.375 g 12.5 mL/hr over 240 Minutes Intravenous Every 8 hours 04/08/22 1428 04/08/22 1447   04/07/22 2000  ceFEPIme (MAXIPIME) 2 g in sodium chloride 0.9 % 100 mL IVPB  Status:  Discontinued        2 g 200 mL/hr over 30 Minutes Intravenous Every 12 hours 04/07/22 1600 04/08/22 1428   04/04/22 0900  cefTRIAXone (ROCEPHIN) 2 g in sodium chloride 0.9 % 100 mL IVPB  Status:  Discontinued        2 g 200 mL/hr over 30 Minutes Intravenous Every 24 hours 04/04/22 0257 04/07/22 1600   04/03/22 1700  ceFEPIme (MAXIPIME) 2 g in sodium chloride 0.9 % 100 mL IVPB  Status:  Discontinued        2 g 200 mL/hr over 30 Minutes Intravenous Every 8 hours 04/03/22 1634 04/04/22 0257   04/03/22 1500  metroNIDAZOLE (FLAGYL) IVPB 500 mg  Status:  Discontinued        500 mg 100 mL/hr over 60 Minutes Intravenous Every 12 hours 04/03/22 1446 04/04/22 0257   04/03/22 1045  ceFEPIme (MAXIPIME) 2 g in sodium chloride 0.9 % 100 mL IVPB        2 g 200 mL/hr over 30 Minutes Intravenous  Once 04/03/22 1031 04/03/22 1429  04/03/22 1045  vancomycin (VANCOCIN) IVPB 1000 mg/200 mL premix        1,000 mg 200 mL/hr over 60 Minutes Intravenous  Once 04/03/22 1031 04/03/22 1241       Assessment/Plan: s/p Procedure(s): OPEN TRANSVERSE LOOP COLOSTOMY and GASTROSTOMY TUBE PLACEMENT (N/A) LAPAROSCOPY DIAGNOSTIC Continue bowel rest until bowel function returns POD 2 Ambulate Ostomy care Metastatic rectal cancer with obstruction  LOS: 17 days    Autumn Messing III 04/20/2022

## 2022-04-20 NOTE — Progress Notes (Signed)
Pt has removed ostomy  pouch, midline dressings and Left upper arm IV. Replaced ostomy pouch and dressing changed.Pt placed on mittens. Dr. Hal Hope  made aware. Will continue to monitor

## 2022-04-20 NOTE — Progress Notes (Signed)
[facility] Progress Note  Patient: Angela Mcpherson     PIR:[id number]  DOA: 04/03/2022   PCP: Default, Provider, MD       Brief hospital course:   This is a 61 year old female with rectal cancer that is metastatic to the liver and lung, hep C cirrhosis, aortic mural thrombus who presented to the hospital with abdominal pain and confusion.  Initially she was found to have colitis. She was started on IV antibiotics.  Blood cultures ultimately grew E. coli and despite appropriate antibiotics WBC count continued to rise. 03/2019 CT revealed left-sided pelvic abscess On 8/23 she underwent left transgluteal drain placement for the pelvic abscess 8/24 she had increasing abdominal pain with an x-ray suggestive of an 8/26 NG tube was placed but the patient removed it the following day it was replaced a couple more times but the patient again removed this requiring it to be replaced a fourth time 8/29-started on steroids and octreotide 9/1: Palliative diverting loop colostomy and placement of a PEG tube  Subjective:  She pulled off her colostomy bag last night. With me she has no complaints.   Assessment and Plan: Principal Problem: Severe Sepsis-   E coli and citrobacter bacteremia -Currently receiving Zosyn - Appreciate ID consult  Active Problems:  Metastatic colorectal cancer with pelvic abscess suspected to be secondary to the cancer -9/1: Diverting loop colostomy and placement of a PEG tube for palliative purposes-procedure was initially going to be laparoscopic but had to be converted to an exploratory lap  Anasarca/ ascites with severe hypoalbuminemia - severe malnutrition -Started low-dose Lasix she has pain related to the anasarca    Acute metabolic encephalopathy - on and off disorientation continues - Dr. 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) Thrombocytopenia (HCC) Elevated INR -Eliquis on hold     Acute urinary retention -Has resolved    Hepatitis C cirrhosis and Splenomegaly   DVT prophylaxis:  Place and maintain sequential compression device Start: 04/04/22 0946   Code Status: DNR  Consultants: General surgery, palliative care Level of Care: Level of care: Med-Surg Disposition Plan:  Status is: Inpatient Remains inpatient appropriate because: Requires further goals of care  Objective:   Vitals:   04/19/22 0349 04/19/22 1310 04/19/22 2042 04/20/22 0438  BP: (!) 154/97 132/83 137/77 102/74  Pulse: 89 (!) 101 100 (!) 108  Resp: '19 18 16 20  '$ Temp: 98.6 F (37 C) 98.4 F (36.9 C) 98.4 F (36.9 C) 97.8 F (36.6 C)  TempSrc: Oral Oral Oral Oral  SpO2: 93% 94% 92% 93%  Weight:      Height:       Filed Weights   04/04/22 0847 04/10/22 1247  Weight: 49.3 kg 62.7 kg   Exam: General exam: Appears comfortable - confused HEENT: oral mucosa moist Respiratory system: Clear to auscultation.  Cardiovascular system: S1 & S2 heard  Gastrointestinal system: Abdomen soft, non-tender, nondistended. Normal bowel sounds   Extremities: No cyanosis, clubbing - + anasarca Psychiatry:  Mood & affect appropriate.    Imaging and lab data was personally reviewed    CBC: Recent Labs  Lab 04/15/22 0325 04/16/22 0357 04/17/22 0317 04/18/22 0354 04/19/22 0314  WBC 10.3 7.3 7.0 8.1 10.7*  NEUTROABS  --   --  5.9  --   --   HGB 9.5* 9.8* 9.0*  9.3* 9.2*  HCT 28.8* 30.5* 27.4* 28.4* 28.2*  MCV 102.9* 105.9* 105.0* 105.6* 105.6*  PLT 102* 87* 78* 69* 74*    Basic Metabolic Panel: Recent Labs  Lab 04/15/22 0325 04/16/22 0357 04/17/22 0317 04/18/22 0354 04/19/22 0314 04/20/22 0314  NA 142 139 137 134* 134* 133*  K 3.2* 3.9 3.8 4.1 4.2 4.3  CL 116* 111 105 102 97* 94*  CO2 '23 24 27 28 '$ 34* 32  GLUCOSE 162* 176* 152* 165* 143* 99  BUN '15 15  15 17 15 17  '$ CREATININE 0.41* 0.43* 0.42* 0.41* 0.39* 0.41*  CALCIUM 8.2* 8.2* 8.0* 7.7* 8.0* 8.0*  MG 1.7 1.8 1.6* 1.7 1.5*  --   PHOS 2.0* 3.1 2.4* 2.4* 2.4*  --     GFR: Estimated Creatinine Clearance: 64.6 mL/min (A) (by C-G formula based on SCr of 0.41 mg/dL (L)).  Scheduled Meds:  sodium chloride   Intravenous Once   sodium chloride   Intravenous Once   Chlorhexidine Gluconate Cloth  6 each Topical Q0600   furosemide  20 mg Intravenous Daily   hydrocortisone  25 mg Rectal BID   Continuous Infusions:  sodium chloride 10 mL/hr at 04/18/22 1058   piperacillin-tazobactam (ZOSYN)  IV 3.375 g (04/20/22 0138)     LOS: 17 days   Author: Debbe Odea  04/20/2022 8:19 AM

## 2022-04-20 NOTE — Progress Notes (Shared)
         [facility]  Date of Admission:  04/03/2022     CC: ***  Lines: ***  Abx: ***  ASSESSMENT: ***  PLAN: ***  Active Problems:   Malignant neoplasm of rectosigmoid junction (HCC)   Acute metabolic encephalopathy   Aortic mural thrombus (HCC)   Thrombocytopenia (HCC)   Hepatitis C   Normocytic anemia   Myocardial injury   Acute urinary retention   Malnutrition of moderate degree   E coli and citrobacter bacteremia   Pelvic abscess in female   Hepatic cirrhosis (HCC)   Hypokalemia   Hypomagnesemia   Coagulopathy (HCC)   Ileus vs bowel obstruction   Hypophosphatemia   Partial intestinal obstruction (HCC)   Allergies  Allergen Reactions   Codeine     unknown   Epinephrine (Anaphylaxis)     unknown   Orange Fruit [Citrus] Other (See Comments)    Blisters on face.   Streptogramins     unknown   Tomato Other (See Comments)    Blisters on face    Scheduled Meds:  sodium chloride   Intravenous Once   sodium chloride   Intravenous Once   Chlorhexidine Gluconate Cloth  6 each Topical Q0600   furosemide  20 mg Intravenous Daily   hydrocortisone  25 mg Rectal BID   magic mouthwash  10 mL Oral QID   Continuous Infusions:  sodium chloride 10 mL/hr at 04/18/22 1058   acetaminophen     magnesium sulfate bolus IVPB     piperacillin-tazobactam (ZOSYN)  IV 3.375 g (04/20/22 0823)   PRN Meds:.acetaminophen **OR** acetaminophen, haloperidol lactate, HYDROmorphone (DILAUDID) injection, lip balm, mouth rinse, prochlorperazine, sodium chloride flush   SUBJECTIVE: ***  Review of Systems: ROS All other ROS was negative, except mentioned above     OBJECTIVE: Vitals:   04/19/22 0349 04/19/22 1310 04/19/22 2042 04/20/22 0438  BP: (!) 154/97 132/83 137/77 102/74  Pulse: 89 (!) 101 100 (!) 108  Resp: '19 18 16 20  '$ Temp: 98.6 F (37 C) 98.4 F (36.9 C) 98.4 F (36.9 C) 97.8 F (36.6 C)  TempSrc: Oral Oral Oral Oral  SpO2:  93% 94% 92% 93%  Weight:      Height:       Body mass index is 23.73 kg/m.  Physical Exam   Lab Results Lab Results  Component Value Date   WBC 10.7 (H) 04/19/2022   HGB 9.2 (L) 04/19/2022   HCT 28.2 (L) 04/19/2022   MCV 105.6 (H) 04/19/2022   PLT 74 (L) 04/19/2022    Lab Results  Component Value Date   CREATININE 0.41 (L) 04/20/2022   BUN 17 04/20/2022   NA 133 (L) 04/20/2022   K 4.3 04/20/2022   CL 94 (L) 04/20/2022   CO2 32 04/20/2022    Lab Results  Component Value Date   ALT 32 04/20/2022   AST 41 04/20/2022   ALKPHOS 77 04/20/2022   BILITOT 1.9 (H) 04/20/2022      Microbiology: No results found for this or any previous visit (from the past 240 hour(s)).   Serology:   Imaging: If present, new imagings (plain films, ct scans, and mri) have been personally visualized and interpreted; radiology reports have been reviewed. Decision making incorporated into the Impression / Recommendations.   Jabier Mutton, [place] [place] [phone] pager    04/20/2022, 12:40 PM

## 2022-04-21 DIAGNOSIS — R7881 Bacteremia: Secondary | ICD-10-CM | POA: Diagnosis not present

## 2022-04-21 DIAGNOSIS — B9620 Unspecified Escherichia coli [E. coli] as the cause of diseases classified elsewhere: Secondary | ICD-10-CM | POA: Diagnosis not present

## 2022-04-21 DIAGNOSIS — N739 Female pelvic inflammatory disease, unspecified: Secondary | ICD-10-CM | POA: Diagnosis not present

## 2022-04-21 LAB — COMPREHENSIVE METABOLIC PANEL
ALT: 26 U/L (ref 0–44)
AST: 30 U/L (ref 15–41)
Albumin: 1.9 g/dL — ABNORMAL LOW (ref 3.5–5.0)
Alkaline Phosphatase: 76 U/L (ref 38–126)
Anion gap: 7 (ref 5–15)
BUN: 14 mg/dL (ref 6–20)
CO2: 31 mmol/L (ref 22–32)
Calcium: 7.8 mg/dL — ABNORMAL LOW (ref 8.9–10.3)
Chloride: 94 mmol/L — ABNORMAL LOW (ref 98–111)
Creatinine, Ser: 0.39 mg/dL — ABNORMAL LOW (ref 0.44–1.00)
GFR, Estimated: >60 mL/min (ref >60)
Glucose, Bld: 95 mg/dL (ref 70–99)
Potassium: 3.3 mmol/L — ABNORMAL LOW (ref 3.5–5.1)
Sodium: 132 mmol/L — ABNORMAL LOW (ref 135–145)
Total Bilirubin: 2.4 mg/dL — ABNORMAL HIGH (ref 0.3–1.2)
Total Protein: 4.8 g/dL — ABNORMAL LOW (ref 6.5–8.1)

## 2022-04-21 LAB — GLUCOSE, CAPILLARY
Glucose-Capillary: 91 mg/dL (ref 70–99)
Glucose-Capillary: 94 mg/dL (ref 70–99)
Glucose-Capillary: 97 mg/dL (ref 70–99)
Glucose-Capillary: 98 mg/dL (ref 70–99)

## 2022-04-21 MED ORDER — KETOROLAC TROMETHAMINE 15 MG/ML IJ SOLN
15.0000 mg | Freq: Three times a day (TID) | INTRAMUSCULAR | Status: DC
  Administered 2022-04-21 – 2022-04-25 (×12): 15 mg via INTRAVENOUS
  Filled 2022-04-21 (×12): qty 1

## 2022-04-21 MED ORDER — ACETAMINOPHEN 10 MG/ML IV SOLN
1000.0000 mg | Freq: Four times a day (QID) | INTRAVENOUS | Status: AC
  Administered 2022-04-21 – 2022-04-22 (×4): 1000 mg via INTRAVENOUS
  Filled 2022-04-21 (×4): qty 100

## 2022-04-21 MED ORDER — OXYCODONE HCL 5 MG PO TABS
5.0000 mg | ORAL_TABLET | ORAL | Status: DC | PRN
  Administered 2022-04-25 (×2): 5 mg via ORAL
  Filled 2022-04-21 (×2): qty 1

## 2022-04-21 NOTE — Progress Notes (Signed)
Physical Therapy Treatment Patient Details Name: Angela Mcpherson MRN: [id number] DOB: 05-31-1961 Today's Date: 04/21/2022   History of Present Illness Patient 61 y.o. female presented 04/03/2022 with abdominal pain, confusion, lethargy, and loose stools.  She underwent CT imaging in the emergency department which showed wall thickening of the colon consistent with colitis.04/09/22 drain placed for pelvic abscess. PMH significant for metastatic rectosigmoid adenocarcinoma. Unfortunately she developed bowel obstruction and required tipredane colostomy and venting PEG tube replacement on 9/1.    PT Comments    Pt agreeable to PT with encouragement, able to transfer bed <> BSC with min assist. Pt ostomy leaking on arrival, RN notified. Pt weak and deconditioned,  will benefit from HHPT at d/c . Pt wants to go home and see her dogs. Will continue efforts to mobilize.  Recommendations for follow up therapy are one component of a multi-disciplinary discharge planning process, led by the attending physician.  Recommendations may be updated based on patient status, additional functional criteria and insurance authorization.  Follow Up Recommendations  Home health PT     Assistance Recommended at Discharge Intermittent Supervision/Assistance  Patient can return home with the following A little help with walking and/or transfers;A little help with bathing/dressing/bathroom;Assistance with cooking/housework;Assist for transportation   Equipment Recommendations  Rolling walker (2 wheels)    Recommendations for Other Services       Precautions / Restrictions Precautions Precautions: Fall Precaution Comments: L gluteal drain, abdominal incision, colostomy, PEG tube to catheter bag Restrictions Weight Bearing Restrictions: No     Mobility  Bed Mobility Overal bed mobility: Needs Assistance Bed Mobility: Sidelying to Sit, Rolling, Sit to Sidelying Rolling: Min guard Sidelying to sit: Min assist, Mod  assist     Sit to sidelying: Min assist, Mod assist General bed mobility comments: min  to mod assist to elevate  trunk to EOB, min-mod assist to control trunk and bring LEs  into bed    Transfers Overall transfer level: Needs assistance Equipment used: Rolling walker (2 wheels) Transfers: Sit to/from Stand, Bed to chair/wheelchair/BSC Sit to Stand: Min assist   Step pivot transfers: Min assist       General transfer comment: with encouragement pt agreeable to transfer to [facility], has been unable to void per RN.  cues for safety and hand placement    Ambulation/Gait                   Stairs             Wheelchair Mobility    Modified Rankin (Stroke Patients Only)       Balance Overall balance assessment: Needs assistance Sitting-balance support: No upper extremity supported, Feet supported Sitting balance-Leahy Scale: Fair Sitting balance - Comments: limited tolerance, min/guard  assist  for safety                                    Cognition Arousal/Alertness: Awake/alert Behavior During Therapy: WFL for tasks assessed/performed Overall Cognitive Status: Within Functional Limits for tasks assessed                                 General Comments: oriented and follows commands, in mittens d/t havign pulled off her ostomy bag 3x previous day        Exercises      General Comments  Pertinent Vitals/Pain Pain Assessment Pain Assessment: No/denies pain    Home Living                          Prior Function            PT Goals (current goals can now be found in the care plan section) Acute Rehab PT Goals Patient Stated Goal: get home and keep doing her exercises PT Goal Formulation: With patient Time For Goal Achievement: 04/24/22 Potential to Achieve Goals: Good Progress towards PT goals: Progressing toward goals    Frequency    Min 3X/week      PT Plan Current plan remains  appropriate    Co-evaluation              AM-PAC PT "6 Clicks" Mobility   Outcome Measure  Help needed turning from your back to your side while in a flat bed without using bedrails?: A Little Help needed moving from lying on your back to sitting on the side of a flat bed without using bedrails?: A Little Help needed moving to and from a bed to a chair (including a wheelchair)?: A Little Help needed standing up from a chair using your arms (e.g., wheelchair or bedside chair)?: A Little Help needed to walk in hospital room?: A Little Help needed climbing 3-5 steps with a railing? : A Little 6 Click Score: 18    End of Session Equipment Utilized During Treatment: Gait belt Activity Tolerance: Patient tolerated treatment well;Patient limited by fatigue Patient left: in bed;with bed alarm set;with call bell/phone within reach;with family/visitor present Nurse Communication: Mobility status PT Visit Diagnosis: Unsteadiness on feet (R26.81);Difficulty in walking, not elsewhere classified (R26.2)     Time: 7353-2992 PT Time Calculation (min) (ACUTE ONLY): 32 min  Charges:  $Therapeutic Activity: 23-37 mins                     Baxter Flattery, PT  Acute Rehab Dept [facility]) [phone]  [facility] Weekend Pager [facility] only)  [phone]  04/21/2022    [facility] 04/21/2022, 4:50 PM

## 2022-04-21 NOTE — Progress Notes (Signed)
[facility] Progress Note  Patient: Angela Mcpherson     FFM:[id number]  DOA: 04/03/2022   PCP: Default, Provider, MD       Brief hospital course:   This is a 61 year old female with rectal cancer that is metastatic to the liver and lung, hep C cirrhosis, aortic mural thrombus who presented to the hospital with abdominal pain and confusion.  Initially she was found to have colitis. She was started on IV antibiotics.  Blood cultures ultimately grew E. coli and despite appropriate antibiotics WBC count continued to rise. 03/2019 CT revealed left-sided pelvic abscess On 8/23 she underwent left transgluteal drain placement for the pelvic abscess 8/24 she had increasing abdominal pain with an x-ray suggestive of an 8/26 NG tube was placed but the patient removed it the following day it was replaced a couple more times but the patient again removed this requiring it to be replaced a fourth time 8/29-started on steroids and octreotide 9/1: Palliative diverting loop colostomy and placement of a PEG tube  Subjective:  She has no complaints. She has been drinking liquids and feels well.  Assessment and Plan: Principal Problem: Severe Sepsis-   E coli and citrobacter bacteremia - Zosyn being discontinued today by ID- they will be signing off  Active Problems:  Metastatic colorectal cancer with pelvic abscess suspected to be secondary to the cancer -9/1: Diverting loop colostomy and placement of a PEG tube for palliative purposes-procedure was initially going to be laparoscopic but had to be converted to an exploratory lap  - drain output documented to be 5 cc  - she is drinking now and venting PEG is being used    Anasarca/ ascites with severe hypoalbuminemia   Hepatitis C cirrhosis and Splenomegaly - severe malnutrition - ascites may also be secondary to cirrhosis - given low-dose Lasix   - will hold Lasix today and follow    Acute metabolic encephalopathy - on and off  disorientation continues  Macrocytic anemia - Vitamin B12 was 1530 and folate was greater than 40 on 04/09/2022  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) Thrombocytopenia (HCC) Elevated INR -Eliquis on hold     Acute urinary retention -Has resolved     DVT prophylaxis:  Place and maintain sequential compression device Start: 04/04/22 0946   Code Status: DNR  Consultants: General surgery, palliative care Level of Care: Level of care: Med-Surg Disposition Plan:  Status is: Inpatient Remains inpatient appropriate because: Still adjusting medications for pain control at home- plan is hospice at home  Objective:   Vitals:   04/20/22 1256 04/20/22 2107 04/21/22 0547 04/21/22 1148  BP: 129/83 103/67 111/66 105/72  Pulse: (!) 110 (!) 104 92 89  Resp: '16 19 18 18  '$ Temp: 97.8 F (36.6 C) 98.1 F (36.7 C) 98.3 F (36.8 C) 97.9 F (36.6 C)  TempSrc: Oral Oral Oral Oral  SpO2: 97% 95% 94% 99%  Weight:      Height:       Filed Weights   04/04/22 0847 04/10/22 1247  Weight: 49.3 kg 62.7 kg   Exam: General exam: Appears comfortable today HEENT: oral mucosa moist Respiratory system: Clear to auscultation.  Cardiovascular system: S1 & S2 heard  Gastrointestinal system: Abdomen soft,colostomy and PEG noted. Abdomen still distended but not as severely. Extremities: No cyanosis, clubbing - + pedal edema   Imaging and lab data was personally reviewed    CBC: Recent Labs  Lab 04/15/22 0325 04/16/22 0357 04/17/22 0317 04/18/22 0354 04/19/22 0314  WBC 10.3 7.3  7.0 8.1 10.7*  NEUTROABS  --   --  5.9  --   --   HGB 9.5* 9.8* 9.0* 9.3* 9.2*  HCT 28.8* 30.5* 27.4* 28.4* 28.2*  MCV 102.9* 105.9* 105.0* 105.6* 105.6*  PLT 102* 87* 78* 69* 74*    Basic Metabolic Panel: Recent Labs  Lab 04/15/22 0325 04/16/22 0357 04/17/22 0317 04/18/22 0354 04/19/22 0314 04/20/22 0314 04/21/22 0308  NA 142 139 137 134* 134* 133* 132*  K 3.2* 3.9 3.8 4.1 4.2 4.3 3.3*  CL 116* 111 105 102 97*  94* 94*  CO2 '23 24 27 28 '$ 34* 32 31  GLUCOSE 162* 176* 152* 165* 143* 99 95  BUN '15 15 15 17 15 17 14  '$ CREATININE 0.41* 0.43* 0.42* 0.41* 0.39* 0.41* 0.39*  CALCIUM 8.2* 8.2* 8.0* 7.7* 8.0* 8.0* 7.8*  MG 1.7 1.8 1.6* 1.7 1.5*  --   --   PHOS 2.0* 3.1 2.4* 2.4* 2.4*  --   --     GFR: Estimated Creatinine Clearance: 64.6 mL/min (A) (by C-G formula based on SCr of 0.39 mg/dL (L)).  Scheduled Meds:  sodium chloride   Intravenous Once   sodium chloride   Intravenous Once   Chlorhexidine Gluconate Cloth  6 each Topical Q0600   furosemide  20 mg Intravenous Daily   hydrocortisone  25 mg Rectal BID   ketorolac  15 mg Intravenous Q8H   magic mouthwash  10 mL Oral QID   Continuous Infusions:  sodium chloride 10 mL/hr at 04/18/22 1058   acetaminophen 1,000 mg (04/21/22 1212)     LOS: 18 days   Author: Debbe Odea  04/21/2022 2:47 PM

## 2022-04-21 NOTE — Progress Notes (Signed)
Notified by attending that patient was having agitation through the night and pulling at her ostomy bag. I have added scheduled tylenol and prn haldol for agitation. The goal of surgery was palliative and to help meet the goal of getting her home. Still unsure of post-operative course and outcome. Need to attempt to mobilize her and get her OOB.  Lane Hacker, DO Palliative Medicine

## 2022-04-21 NOTE — Consult Note (Addendum)
[place] Nurse ostomy consult note Stoma type/location: RUQ, loop colostomy Stomal assessment/size: >2" Peristomal assessment: NA Treatment options for stomal/peristomal skin: NA Output bloody Ostomy pouching: 1pc. Flat Patient has pulled off pouch 3 x since surgery, most recently this am prior to my arrival; patient has been intermittently confused Education provided:  Met with husband and patient Explained role of ostomy nurse and creation of stoma  Explained stoma characteristics (budded, flush, color, texture, care) Education on emptying when 1/3 to 1/2 full and how to empty Provided patient with [employer] and marked items currently using Answered patient/family questions Planned for pouch change in the am with husband   Enrolled patient in [place] program: Yes [place] Nurse will follow along with you for continued support with ostomy teaching and care [place] MSN, [place], [place], [place], [place]

## 2022-04-21 NOTE — Progress Notes (Signed)
Occupational Therapy Treatment Patient Details Name: Angela Mcpherson MRN: [id number] DOB: 12-02-1960 Today's Date: 04/21/2022   History of present illness Patient 61 y.o. female presented 04/03/2022 with abdominal pain, confusion, lethargy, and loose stools.  She underwent CT imaging in the emergency department which showed wall thickening of the colon consistent with colitis.04/09/22 drain placed for pelvic abscess. PMH significant for metastatic rectosigmoid adenocarcinoma. Unfortunately she developed bowel obstruction and required tipredane colostomy and venting PEG tube replacement on 9/1.   OT comments  Patient is making slow progress towards goals but progress has been hindered by need for surgery and poor nutrition. Patient able to sit edge of bed for grooming and to transfer to [facility] for toileting. Encouraged patient to be out of bed for meals and increased activity. Patient and husband verbalize understanding. Goal is to go home at discharge.    Recommendations for follow up therapy are one component of a multi-disciplinary discharge planning process, led by the attending physician.  Recommendations may be updated based on patient status, additional functional criteria and insurance authorization.    Follow Up Recommendations  Home health OT    Assistance Recommended at Discharge Frequent or constant Supervision/Assistance  Patient can return home with the following  A little help with walking and/or transfers;A little help with bathing/dressing/bathroom;Assistance with cooking/housework;Help with stairs or ramp for entrance;Direct supervision/assist for financial management;Direct supervision/assist for medications management   Equipment Recommendations  None recommended by OT    Recommendations for Other Services      Precautions / Restrictions Precautions Precautions: Fall Precaution Comments: L gluteal drain, abdominal incision, colostomy, PEG tube to catheter  bag Restrictions Weight Bearing Restrictions: No       Mobility Bed Mobility                    Transfers                         Balance Overall balance assessment: Mild deficits observed, not formally tested                                         ADL either performed or assessed with clinical judgement   ADL Overall ADL's : Needs assistance/impaired Eating/Feeding: Independent   Grooming: Set up;Sitting;Brushing hair Grooming Details (indicate cue type and reason): at edge of bed to work on sitting activity tolerance x 5 min             Lower Body Dressing: Maximal assistance Lower Body Dressing Details (indicate cue type and reason): to don socks Toilet Transfer: Minimal assistance;BSC/3in1;Rolling walker (2 wheels)   Toileting- Clothing Manipulation and Hygiene: Sitting/lateral lean;Moderate assistance Toileting - Clothing Manipulation Details (indicate cue type and reason): able to wipe herself in seated postion  but therapist assisted to improve quality - needs assist for clothing management     Functional mobility during ADLs: Minimal assistance;Rolling walker (2 wheels) General ADL Comments: EOB sitting for grooming task and tioleting. Brief reprots of dizziness with sitting but no symptoms. Transferred to recliner after toileting.    Extremity/Trunk Assessment Upper Extremity Assessment Upper Extremity Assessment: Generalized weakness   Lower Extremity Assessment Lower Extremity Assessment: Defer to PT evaluation   Cervical / Trunk Assessment Cervical / Trunk Assessment: Normal    Vision Patient Visual Report: No change from baseline     Perception  Praxis      Cognition Arousal/Alertness: Awake/alert Behavior During Therapy: WFL for tasks assessed/performed Overall Cognitive Status: Within Functional Limits for tasks assessed                                          Exercises       Shoulder Instructions       General Comments      Pertinent Vitals/ Pain       Pain Assessment Pain Assessment: No/denies pain Pain Intervention(s): Monitored during session  Home Living                                          Prior Functioning/Environment              Frequency  Min 2X/week        Progress Toward Goals  OT Goals(current goals can now be found in the care plan section)  Progress towards OT goals: Progressing toward goals  Acute Rehab OT Goals Patient Stated Goal: go home OT Goal Formulation: With patient/family Time For Goal Achievement: 05/05/22 Potential to Achieve Goals: [place] Discharge plan needs to be updated    Co-evaluation                 AM-PAC OT "6 Clicks" Daily Activity     Outcome Measure   Help from another person eating meals?: None Help from another person taking care of personal grooming?: A Little Help from another person toileting, which includes using toliet, bedpan, or urinal?: A Little Help from another person bathing (including washing, rinsing, drying)?: A Little Help from another person to put on and taking off regular upper body clothing?: A Little Help from another person to put on and taking off regular lower body clothing?: A Lot 6 Click Score: 18    End of Session Equipment Utilized During Treatment: Rolling walker (2 wheels)  OT Visit Diagnosis: Unsteadiness on feet (R26.81);Other abnormalities of gait and mobility (R26.89);Muscle weakness (generalized) (M62.81)   Activity Tolerance Patient tolerated treatment well   Patient Left in chair;with call bell/phone within reach;with chair alarm set;with family/visitor present   Nurse Communication  (okay to see)        Time: 9774-1423 OT Time Calculation (min): 23 min  Charges: OT General Charges $OT Visit: 1 Visit OT Treatments $Self Care/Home Management : 23-37 mins  Gustavo Lah, OTR/L [place]   Office [phone]   Lenward Chancellor 04/21/2022, 10:51 AM

## 2022-04-21 NOTE — Progress Notes (Signed)
3 Days Post-Op   Subjective/Chief Complaint: No complaints. Gets confused at times. In mittens this am   Objective: Vital signs in last 24 hours: Temp:  [97.8 F (36.6 C)-98.3 F (36.8 C)] 98.3 F (36.8 C) (09/04 0547) Pulse Rate:  [92-110] 92 (09/04 0547) Resp:  [16-19] 18 (09/04 0547) BP: (103-129)/(66-83) 111/66 (09/04 0547) SpO2:  [94 %-97 %] 94 % (09/04 0547) Last BM Date : 04/18/22  Intake/Output from previous day: 09/03 0701 - 09/04 0700 In: 552.5 [I.V.:352.3; IV Piggyback:200.2] Out: 3070 [Urine:1800; Drains:470; Stool:800] Intake/Output this shift: No intake/output data recorded.  General appearance: alert and cooperative Resp: clear to auscultation bilaterally Cardio: regular rate and rhythm GI: soft, mild tenderness. Ostomy pink  Lab Results:  Recent Labs    04/19/22 0314  WBC 10.7*  HGB 9.2*  HCT 28.2*  PLT 74*   BMET Recent Labs    04/20/22 0314 04/21/22 0308  NA 133* 132*  K 4.3 3.3*  CL 94* 94*  CO2 32 31  GLUCOSE 99 95  BUN 17 14  CREATININE 0.41* 0.39*  CALCIUM 8.0* 7.8*   PT/INR No results for input(s): "LABPROT", "INR" in the last 72 hours. ABG No results for input(s): "PHART", "HCO3" in the last 72 hours.  Invalid input(s): "PCO2", "PO2"  Studies/Results: No results found.  Anti-infectives: Anti-infectives (From admission, onward)    Start     Dose/Rate Route Frequency Ordered Stop   04/14/22 1800  piperacillin-tazobactam (ZOSYN) IVPB 3.375 g        3.375 g 12.5 mL/hr over 240 Minutes Intravenous Every 8 hours 04/14/22 1224     04/08/22 2100  ceFEPIme (MAXIPIME) 2 g in sodium chloride 0.9 % 100 mL IVPB  Status:  Discontinued        2 g 200 mL/hr over 30 Minutes Intravenous Every 12 hours 04/08/22 1453 04/14/22 1224   04/08/22 1600  metroNIDAZOLE (FLAGYL) IVPB 500 mg  Status:  Discontinued        500 mg 100 mL/hr over 60 Minutes Intravenous Every 12 hours 04/08/22 1453 04/14/22 1224   04/08/22 1515  piperacillin-tazobactam  (ZOSYN) IVPB 3.375 g  Status:  Discontinued        3.375 g 12.5 mL/hr over 240 Minutes Intravenous Every 8 hours 04/08/22 1428 04/08/22 1447   04/07/22 2000  ceFEPIme (MAXIPIME) 2 g in sodium chloride 0.9 % 100 mL IVPB  Status:  Discontinued        2 g 200 mL/hr over 30 Minutes Intravenous Every 12 hours 04/07/22 1600 04/08/22 1428   04/04/22 0900  cefTRIAXone (ROCEPHIN) 2 g in sodium chloride 0.9 % 100 mL IVPB  Status:  Discontinued        2 g 200 mL/hr over 30 Minutes Intravenous Every 24 hours 04/04/22 0257 04/07/22 1600   04/03/22 1700  ceFEPIme (MAXIPIME) 2 g in sodium chloride 0.9 % 100 mL IVPB  Status:  Discontinued        2 g 200 mL/hr over 30 Minutes Intravenous Every 8 hours 04/03/22 1634 04/04/22 0257   04/03/22 1500  metroNIDAZOLE (FLAGYL) IVPB 500 mg  Status:  Discontinued        500 mg 100 mL/hr over 60 Minutes Intravenous Every 12 hours 04/03/22 1446 04/04/22 0257   04/03/22 1045  ceFEPIme (MAXIPIME) 2 g in sodium chloride 0.9 % 100 mL IVPB        2 g 200 mL/hr over 30 Minutes Intravenous  Once 04/03/22 1031 04/03/22 1429   04/03/22 1045  vancomycin (VANCOCIN) IVPB 1000 mg/200 mL premix        1,000 mg 200 mL/hr over 60 Minutes Intravenous  Once 04/03/22 1031 04/03/22 1241       Assessment/Plan: s/p Procedure(s): OPEN TRANSVERSE LOOP COLOSTOMY and GASTROSTOMY TUBE PLACEMENT (N/A) LAPAROSCOPY DIAGNOSTIC Advance diet. Start fulls today. Stool is recorded from ostomy PT/OT Ambulate Ostomy care Metastatic rectal cancer with obstruction POD 3  LOS: 18 days    Angela Mcpherson 04/21/2022

## 2022-04-22 ENCOUNTER — Encounter (HOSPITAL_COMMUNITY): Payer: Self-pay | Admitting: Surgery

## 2022-04-22 ENCOUNTER — Inpatient Hospital Stay (HOSPITAL_COMMUNITY): Payer: 59

## 2022-04-22 DIAGNOSIS — N739 Female pelvic inflammatory disease, unspecified: Secondary | ICD-10-CM | POA: Diagnosis not present

## 2022-04-22 DIAGNOSIS — R7881 Bacteremia: Secondary | ICD-10-CM | POA: Diagnosis not present

## 2022-04-22 DIAGNOSIS — B9620 Unspecified Escherichia coli [E. coli] as the cause of diseases classified elsewhere: Secondary | ICD-10-CM | POA: Diagnosis not present

## 2022-04-22 HISTORY — PX: IR SINUS/FIST TUBE CHK-NON GI: IMG673

## 2022-04-22 LAB — CBC
HCT: 33.3 % — ABNORMAL LOW (ref 36.0–46.0)
Hemoglobin: 11.1 g/dL — ABNORMAL LOW (ref 12.0–15.0)
MCH: 35.1 pg — ABNORMAL HIGH (ref 26.0–34.0)
MCHC: 33.3 g/dL (ref 30.0–36.0)
MCV: 105.4 fL — ABNORMAL HIGH (ref 80.0–100.0)
Platelets: 79 10*3/uL — ABNORMAL LOW (ref 150–400)
RBC: 3.16 MIL/uL — ABNORMAL LOW (ref 3.87–5.11)
RDW: 22.3 % — ABNORMAL HIGH (ref 11.5–15.5)
WBC: 13.9 10*3/uL — ABNORMAL HIGH (ref 4.0–10.5)
nRBC: 0 % (ref 0.0–0.2)

## 2022-04-22 LAB — GLUCOSE, CAPILLARY
Glucose-Capillary: 107 mg/dL — ABNORMAL HIGH (ref 70–99)
Glucose-Capillary: 109 mg/dL — ABNORMAL HIGH (ref 70–99)
Glucose-Capillary: 83 mg/dL (ref 70–99)
Glucose-Capillary: 99 mg/dL (ref 70–99)

## 2022-04-22 MED ORDER — IOHEXOL 300 MG/ML  SOLN
50.0000 mL | Freq: Once | INTRAMUSCULAR | Status: AC | PRN
Start: 2022-04-22 — End: 2022-04-22
  Administered 2022-04-22: 10 mL

## 2022-04-22 NOTE — Progress Notes (Signed)
Physical Therapy Treatment Patient Details Name: Angela Mcpherson MRN: [id number] DOB: 04/24/1961 Today's Date: 04/22/2022   History of Present Illness Patient 61 y.o. female presented 04/03/2022 with abdominal pain, confusion, lethargy, and loose stools.  She underwent CT imaging in the emergency department which showed wall thickening of the colon consistent with colitis.04/09/22 drain placed for pelvic abscess. PMH significant for metastatic rectosigmoid adenocarcinoma. Unfortunately she developed bowel obstruction and required tipredane colostomy and venting PEG tube replacement on 9/1.    PT Comments    Pt progressing quite well this session/ improved activity tolerance, greatly incr gait distance. Pt pleased with her progress. Encouraged OOB/up in chair as much as tolerated. Will benefit from HHPT at d/c  Recommendations for follow up therapy are one component of a multi-disciplinary discharge planning process, led by the attending physician.  Recommendations may be updated based on patient status, additional functional criteria and insurance authorization.  Follow Up Recommendations  Home health PT     Assistance Recommended at Discharge Intermittent Supervision/Assistance  Patient can return home with the following A little help with walking and/or transfers;A little help with bathing/dressing/bathroom;Assistance with cooking/housework;Assist for transportation   Equipment Recommendations  Rolling walker (2 wheels)    Recommendations for Other Services       Precautions / Restrictions Precautions Precautions: Fall Precaution Comments: L gluteal drain, abdominal incision, ostomy, PEG tube-clamped Restrictions Weight Bearing Restrictions: No     Mobility  Bed Mobility Overal bed mobility: Needs Assistance Bed Mobility: Sidelying to Sit, Rolling, Sit to Sidelying Rolling: Min guard Sidelying to sit: Min assist, Min guard     Sit to sidelying: Min assist General bed mobility  comments: min assist-min/guard to elevate  trunk    Transfers Overall transfer level: Needs assistance Equipment used: Rolling walker (2 wheels) Transfers: Sit to/from Stand, Bed to chair/wheelchair/BSC Sit to Stand: Min guard   Step pivot transfers: Min guard       General transfer comment: cues for hand placement, min/guard for safety    Ambulation/Gait Ambulation/Gait assistance: Min guard Gait Distance (Feet): 180 Feet Assistive device: Rolling walker (2 wheels) Gait Pattern/deviations: Step-through pattern       General Gait Details: cues for trunk extension, good stability with RW, without overt LOB   Stairs             Wheelchair Mobility    Modified Rankin (Stroke Patients Only)       Balance Overall balance assessment: Needs assistance Sitting-balance support: No upper extremity supported, Feet supported Sitting balance-Leahy Scale: Good     Standing balance support: Single extremity supported, No upper extremity supported Standing balance-Leahy Scale: Fair Standing balance comment: reliant on RW for dynamic tasks, able to [employer] stand without support                            Cognition Arousal/Alertness: Awake/alert Behavior During Therapy: WFL for tasks assessed/performed Overall Cognitive Status: Within Functional Limits for tasks assessed                                 General Comments: oriented and follows commands        Exercises      General Comments        Pertinent Vitals/Pain Pain Assessment Pain Assessment: No/denies pain    Home Living  Prior Function            PT Goals (current goals can now be found in the care plan section) Acute Rehab PT Goals Patient Stated Goal: get home and keep doing her exercises PT Goal Formulation: With patient Time For Goal Achievement: 04/24/22 Potential to Achieve Goals: Good Progress towards PT goals: Progressing  toward goals    Frequency    Min 3X/week      PT Plan Current plan remains appropriate    Co-evaluation              AM-PAC PT "6 Clicks" Mobility   Outcome Measure  Help needed turning from your back to your side while in a flat bed without using bedrails?: A Little Help needed moving from lying on your back to sitting on the side of a flat bed without using bedrails?: A Little Help needed moving to and from a bed to a chair (including a wheelchair)?: A Little Help needed standing up from a chair using your arms (e.g., wheelchair or bedside chair)?: A Little Help needed to walk in hospital room?: A Little Help needed climbing 3-5 steps with a railing? : A Little 6 Click Score: 18    End of Session Equipment Utilized During Treatment: Gait belt Activity Tolerance: Patient tolerated treatment well Patient left: with call bell/phone within reach;with family/visitor present;in chair;with chair alarm set Nurse Communication: Mobility status PT Visit Diagnosis: Unsteadiness on feet (R26.81);Difficulty in walking, not elsewhere classified (R26.2)     Time: 8333-8329 PT Time Calculation (min) (ACUTE ONLY): 18 min  Charges:  $Gait Training: 8-22 mins                     Baxter Flattery, PT  Acute Rehab Dept [facility]) [phone]  [facility] Weekend Pager [facility] only)  [phone]  04/22/2022    [facility] 04/22/2022, 12:31 PM

## 2022-04-22 NOTE — Consult Note (Signed)
[place] Nurse ostomy follow up Stoma type/location: RUQ, loop colostomy Stomal assessment/size: >2" pink, edematous stoma Peristomal assessment: intact  Treatment options for stomal/peristomal skin: NA Output brown, liquid stool  Ostomy pouching: 1pc.flat  Education provided:  Explained stoma characteristics; edematous related to obstruction, may decrease in size, need to re measure as noticing decreasing edema  Demonstrated pouch change (cutting new skin barrier- allowed husband to do this today, measuring stoma, cleaning peristomal skin and stoma) Education on emptying when 1/3 to 1/2 full and how to empty Patient practiced lock and roll closure and husband has been practicing on pouch in the room Demonstrated use of wick to clean spout  Discussed bathing, diet, gas, medication use, constipation Provided patient with [employer] and marked items currently using Answered patient/family questions; reviewed steps with patient several times  Husband very engaged to assist and has read educational materials, very attentive to steps and able to verbalize many of the steps prior to performing today.  Had husband cut new skin barrier slightly larger today to accommodate stoma a little easier with next pouch change    Enrolled patient in [place] Start Discharge program: Yes  [place] Nurse will follow along with you for continued support with ostomy teaching and care; planned teaching visit with patient and husband on Thursday this week.  Clearance [facility] MSN, [place], [place], [place], [place]

## 2022-04-22 NOTE — Progress Notes (Signed)
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  Admitted with altered mental status and was found to be in septic shock from intra-abdominal abscess with blood cultures positive for E. Coli.   She is s/p palliative diverting colostomy and PEG placement. POD#2. She is OOB in a chair, having issues with confusion but she is not agitated. Her husband Angela Mcpherson is at bedside and very attentive to her care. She complains of tenderness around the colostomy and back pain.  The goal is for her to go home with hospice care when medically optimized and symptoms are under better control. She is happy to have the NG tube out, still has a poor appetite.  Recommendations: DNR, goals are to treat reversible illness and palliative interventions that can help her get home. Her overall prognosis remains poor but they are hopeful for stablization and improved QOL. Husband is asking about drain that she has in place from before surgery. Need to have IR evaluate for removal. Minimize opioids due to confusion-will schedule Tylenol and Toradol alternating doses and switch her to oxycodone from hydromorphone. Kpad to her back. Encouraged mobility and safety.  Lane Hacker, DO Palliative Medicine  Time: 35 min She is having signs of delirium

## 2022-04-22 NOTE — TOC Progression Note (Signed)
Transition of Care [facility]) - Progression Note    Patient Details  Name: Angela Mcpherson MRN: [id number] Date of Birth: [birth year]  Transition of Care [facility]) CM/SW [place], RN Phone Number: 04/22/2022, 11:13 AM  Clinical Narrative:   Per chart review, plan is home with hospice. Awaiting TOC consult to offer home hospice.   TOC will continue to follow.    Expected Discharge Plan: Home w Hospice Care Barriers to Discharge: Continued Medical Work up  Expected Discharge Plan and Services Expected Discharge Plan: [place] In-house Referral: NA Discharge Planning Services: CM Consult Post Acute Care Choice: Home Health, Durable Medical Equipment Living arrangements for the past 2 months: Single Family Home                 DME Arranged: N/A DME Agency: NA       HH Arranged: NA HH Agency: NA         Social Determinants of Health (SDOH) Interventions    Readmission Risk Interventions    04/16/2022    2:57 PM 04/11/2022   11:52 AM  Readmission Risk Prevention Plan  Transportation Screening Complete Complete  PCP or Specialist Appt within 3-5 Days  Complete  HRI or [place]  Complete  Social Work Consult for [place] Planning/Counseling  Complete  Palliative Care Screening  Not Applicable  Medication Review Press photographer) Complete Complete  PCP or Specialist appointment within 3-5 days of discharge Complete   HRI or [place] Complete   SW Recovery Care/Counseling Consult Complete   Palliative Care Screening Complete   [place] Not Applicable

## 2022-04-22 NOTE — Procedures (Signed)
Interventional Radiology Procedure Note  Procedure:  1) Drain check 2) Drain removal  Findings: Please refer to procedural dictation for full description. No evidence of colonic fistula.  Minimal residual left pelvic fluid collection.  Drain removed successfully.  Complications: None immediate  Estimated Blood Loss: < 5 mL  Recommendations: Follow up with IR as needed.   Ruthann Cancer, MD

## 2022-04-22 NOTE — Progress Notes (Addendum)
[facility] Progress Note  4 Days Post-Op  Subjective: CC-  Comfortable this morning. Husband at bedside. She is sipping on full liquids, G tube is to gravity drainage. Reports that her bloating is much better than last week prior to surgery. Currently denies n/v. Ostomy productive.   Objective: Vital signs in last 24 hours: Temp:  [97.8 F (36.6 C)-97.9 F (36.6 C)] 97.8 F (36.6 C) (09/05 0556) Pulse Rate:  [89-105] 105 (09/05 0556) Resp:  [18-21] 19 (09/05 0556) BP: (105-108)/(63-72) 107/68 (09/05 0556) SpO2:  [95 %-99 %] 95 % (09/05 0556) Last BM Date : 04/21/22  Intake/Output from previous day: 09/04 0701 - 09/05 0700 In: 863.6 [P.O.:600; IV Piggyback:263.6] Out: 3950 [Urine:1000; Drains:2550; Stool:400] Intake/Output this shift: Total I/O In: -  Out: 700 [Drains:700]  PE: Gen:  Alert, NAD, pleasant Abd: soft, mild distension, mild diffuse tenderness without rebound or guarding. Ostomy edematous and pink with small amount of thin liquid brown stool in pouch  Lab Results:  No results for input(s): "WBC", "HGB", "HCT", "PLT" in the last 72 hours. BMET Recent Labs    04/20/22 0314 04/21/22 0308  NA 133* 132*  K 4.3 3.3*  CL 94* 94*  CO2 32 31  GLUCOSE 99 95  BUN 17 14  CREATININE 0.41* 0.39*  CALCIUM 8.0* 7.8*   PT/INR No results for input(s): "LABPROT", "INR" in the last 72 hours. CMP     Component Value Date/Time   NA 132 (L) 04/21/2022 0308   K 3.3 (L) 04/21/2022 0308   CL 94 (L) 04/21/2022 0308   CO2 31 04/21/2022 0308   GLUCOSE 95 04/21/2022 0308   BUN 14 04/21/2022 0308   CREATININE 0.39 (L) 04/21/2022 0308   CREATININE 0.55 03/24/2022 1029   CALCIUM 7.8 (L) 04/21/2022 0308   PROT 4.8 (L) 04/21/2022 0308   ALBUMIN 1.9 (L) 04/21/2022 0308   AST 30 04/21/2022 0308   AST 28 03/24/2022 1029   ALT 26 04/21/2022 0308   ALT 19 03/24/2022 1029   ALKPHOS 76 04/21/2022 0308   BILITOT 2.4 (H) 04/21/2022 0308   BILITOT 1.8 (H) 03/24/2022  1029   GFRNONAA >60 04/21/2022 0308   GFRNONAA >60 03/24/2022 1029   GFRAA  06/26/2010 0755    >60        The eGFR has been calculated using the MDRD equation. This calculation has not been validated in all clinical situations. eGFR's persistently <60 mL/min signify possible Chronic Kidney Disease.   Lipase     Component Value Date/Time   LIPASE 44 04/03/2022 1126       Studies/Results: No results found.  Anti-infectives: Anti-infectives (From admission, onward)    Start     Dose/Rate Route Frequency Ordered Stop   04/14/22 1800  piperacillin-tazobactam (ZOSYN) IVPB 3.375 g  Status:  Discontinued        3.375 g 12.5 mL/hr over 240 Minutes Intravenous Every 8 hours 04/14/22 1224 04/21/22 1325   04/08/22 2100  ceFEPIme (MAXIPIME) 2 g in sodium chloride 0.9 % 100 mL IVPB  Status:  Discontinued        2 g 200 mL/hr over 30 Minutes Intravenous Every 12 hours 04/08/22 1453 04/14/22 1224   04/08/22 1600  metroNIDAZOLE (FLAGYL) IVPB 500 mg  Status:  Discontinued        500 mg 100 mL/hr over 60 Minutes Intravenous Every 12 hours 04/08/22 1453 04/14/22 1224   04/08/22 1515  piperacillin-tazobactam (ZOSYN) IVPB 3.375 g  Status:  Discontinued  3.375 g 12.5 mL/hr over 240 Minutes Intravenous Every 8 hours 04/08/22 1428 04/08/22 1447   04/07/22 2000  ceFEPIme (MAXIPIME) 2 g in sodium chloride 0.9 % 100 mL IVPB  Status:  Discontinued        2 g 200 mL/hr over 30 Minutes Intravenous Every 12 hours 04/07/22 1600 04/08/22 1428   04/04/22 0900  cefTRIAXone (ROCEPHIN) 2 g in sodium chloride 0.9 % 100 mL IVPB  Status:  Discontinued        2 g 200 mL/hr over 30 Minutes Intravenous Every 24 hours 04/04/22 0257 04/07/22 1600   04/03/22 1700  ceFEPIme (MAXIPIME) 2 g in sodium chloride 0.9 % 100 mL IVPB  Status:  Discontinued        2 g 200 mL/hr over 30 Minutes Intravenous Every 8 hours 04/03/22 1634 04/04/22 0257   04/03/22 1500  metroNIDAZOLE (FLAGYL) IVPB 500 mg  Status:   Discontinued        500 mg 100 mL/hr over 60 Minutes Intravenous Every 12 hours 04/03/22 1446 04/04/22 0257   04/03/22 1045  ceFEPIme (MAXIPIME) 2 g in sodium chloride 0.9 % 100 mL IVPB        2 g 200 mL/hr over 30 Minutes Intravenous  Once 04/03/22 1031 04/03/22 1429   04/03/22 1045  vancomycin (VANCOCIN) IVPB 1000 mg/200 mL premix        1,000 mg 200 mL/hr over 60 Minutes Intravenous  Once 04/03/22 1031 04/03/22 1241        Assessment/Plan Metastatic rectal cancer with complete obstruction -POD#4 s/p Diagnostic laparoscopy converted to exploratory laparotomy, placement of gastrostomy tube, diverting transverse loop colostomy 9/1 Dr. Georgette Dover - ostomy is productive. Will try clamping G tube today (if patient has worsening abdominal pain/ bloating or develops n/v then return G tube to gravity drainage). Continue full liquids  - mobilize, PT/OT - recommending home health therapies. 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

## 2022-04-22 NOTE — Progress Notes (Signed)
[facility] Progress Note  Patient: Angela Mcpherson     YCX:[id number]  DOA: 04/03/2022   PCP: Default, Provider, MD       Brief hospital course:   This is a 61 year old female with rectal cancer that is metastatic to the liver and lung, hep C cirrhosis, aortic mural thrombus who presented to the hospital with abdominal pain and confusion.  Initially she was found to have colitis. She was started on IV antibiotics.  Blood cultures ultimately grew E. coli and despite appropriate antibiotics WBC count continued to rise. 03/2019 CT revealed left-sided pelvic abscess On 8/23 she underwent left transgluteal drain placement for the pelvic abscess 8/24 she had increasing abdominal pain with an x-ray suggestive of an 8/26 NG tube was placed but the patient removed it the following day it was replaced a couple more times but the patient again removed this requiring it to be replaced a fourth time 8/29-started on steroids and octreotide 9/1: Palliative diverting loop colostomy and placement of a PEG tube Some confusion after surgery thought to be related to Dilaudid.  9/4 started on full liquid diet  Subjective:  Having occasional abdominal distension and pain. Overall tolerating liquid diet.  Assessment and Plan: Principal Problem: Severe Sepsis-   E coli and citrobacter bacteremia - Zosyn being discontinued ton 9/4 by ID at which point ID signed off.  Active Problems:  Metastatic colorectal cancer with pelvic abscess suspected to be secondary to the cancer -9/1: Diverting loop colostomy and placement of a PEG tube for palliative purposes-procedure was initially going to be laparoscopic but had to be converted to an exploratory lap  - drain removed today - she is drinking now and venting PEG is being used   - PEG clamping to be attempted today - plan to go home with husband and hospice - appreciate palliative care managing pain medications   Anasarca/ ascites with severe  hypoalbuminemia   Hepatitis C cirrhosis and Splenomegaly - severe malnutrition - ascites may also be secondary to cirrhosis - giving low-dose Lasix PRN- as TPN stopped and she is not actually tolerating orals, have held Lasix for now    Acute metabolic encephalopathy - on and off disorientation may be related to acute illness and meds - given a dose of IV Haldol last night - seems oriented today  Macrocytic anemia - Vitamin B12 was 1530 and folate was greater than 40 on 04/09/2022  H/o 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) Thrombocytopenia (HCC) Elevated INR -Eliquis on hold  - will order CBC for tomorrow to f/u on thrombocytopenia which may have been from infection    Acute urinary retention -Has resolved  DVT prophylaxis:  Place and maintain sequential compression device Start: 04/04/22 0946   Code Status: DNR  Consultants: General surgery, palliative care Level of Care: Level of care: Med-Surg Disposition Plan:  Status is: Inpatient Remains inpatient appropriate because: PEG clamping trial- also on IV Toradol for pain- plan is hospice at home  Objective:   Vitals:   04/21/22 1148 04/21/22 2009 04/22/22 0556 04/22/22 1309  BP: 105/72 108/63 107/68 103/68  Pulse: 89 95 (!) 105 (!) 105  Resp: 18 (!) '21 19 20  '$ Temp: 97.9 F (36.6 C) 97.9 F (36.6 C) 97.8 F (36.6 C) 97.7 F (36.5 C)  TempSrc: Oral Oral Oral Oral  SpO2: 99% 97% 95% 99%  Weight:      Height:       Filed Weights   04/04/22 0847 04/10/22 1247  Weight: 49.3 kg 62.7  kg   Exam: General exam: Appears comfortable  HEENT: oral mucosa moist Respiratory system: Clear to auscultation.  Cardiovascular system: S1 & S2 heard  Gastrointestinal system: Abdomen soft, moderately distended. Normal bowel sounds   -small amount of blood tinged fluid in colostomy- PEG present. Extremities: No cyanosis, clubbing + anasarca Psychiatry:  Mood & affect appropriate.     Imaging and lab data was personally reviewed     CBC: Recent Labs  Lab 04/16/22 0357 04/17/22 0317 04/18/22 0354 04/19/22 0314  WBC 7.3 7.0 8.1 10.7*  NEUTROABS  --  5.9  --   --   HGB 9.8* 9.0* 9.3* 9.2*  HCT 30.5* 27.4* 28.4* 28.2*  MCV 105.9* 105.0* 105.6* 105.6*  PLT 87* 78* 69* 74*    Basic Metabolic Panel: Recent Labs  Lab 04/16/22 0357 04/17/22 0317 04/18/22 0354 04/19/22 0314 04/20/22 0314 04/21/22 0308  NA 139 137 134* 134* 133* 132*  K 3.9 3.8 4.1 4.2 4.3 3.3*  CL 111 105 102 97* 94* 94*  CO2 '24 27 28 '$ 34* 32 31  GLUCOSE 176* 152* 165* 143* 99 95  BUN '15 15 17 15 17 14  '$ CREATININE 0.43* 0.42* 0.41* 0.39* 0.41* 0.39*  CALCIUM 8.2* 8.0* 7.7* 8.0* 8.0* 7.8*  MG 1.8 1.6* 1.7 1.5*  --   --   PHOS 3.1 2.4* 2.4* 2.4*  --   --     GFR: Estimated Creatinine Clearance: 64.6 mL/min (A) (by C-G formula based on SCr of 0.39 mg/dL (L)).  Scheduled Meds:  sodium chloride   Intravenous Once   sodium chloride   Intravenous Once   Chlorhexidine Gluconate Cloth  6 each Topical Q0600   hydrocortisone  25 mg Rectal BID   ketorolac  15 mg Intravenous Q8H   magic mouthwash  10 mL Oral QID   Continuous Infusions:  sodium chloride 10 mL/hr at 04/18/22 1058     LOS: 19 days   Author: Debbe Odea  04/22/2022 3:55 PM

## 2022-04-23 DIAGNOSIS — N739 Female pelvic inflammatory disease, unspecified: Secondary | ICD-10-CM | POA: Diagnosis not present

## 2022-04-23 LAB — CBC
HCT: 31.8 % — ABNORMAL LOW (ref 36.0–46.0)
Hemoglobin: 10.5 g/dL — ABNORMAL LOW (ref 12.0–15.0)
MCH: 34.9 pg — ABNORMAL HIGH (ref 26.0–34.0)
MCHC: 33 g/dL (ref 30.0–36.0)
MCV: 105.6 fL — ABNORMAL HIGH (ref 80.0–100.0)
Platelets: 85 10*3/uL — ABNORMAL LOW (ref 150–400)
RBC: 3.01 MIL/uL — ABNORMAL LOW (ref 3.87–5.11)
RDW: 22.3 % — ABNORMAL HIGH (ref 11.5–15.5)
WBC: 12.9 10*3/uL — ABNORMAL HIGH (ref 4.0–10.5)
nRBC: 0 % (ref 0.0–0.2)

## 2022-04-23 LAB — GLUCOSE, CAPILLARY
Glucose-Capillary: 107 mg/dL — ABNORMAL HIGH (ref 70–99)
Glucose-Capillary: 138 mg/dL — ABNORMAL HIGH (ref 70–99)
Glucose-Capillary: 144 mg/dL — ABNORMAL HIGH (ref 70–99)

## 2022-04-23 LAB — BASIC METABOLIC PANEL
Anion gap: 7 (ref 5–15)
BUN: 21 mg/dL — ABNORMAL HIGH (ref 6–20)
CO2: 34 mmol/L — ABNORMAL HIGH (ref 22–32)
Calcium: 7.8 mg/dL — ABNORMAL LOW (ref 8.9–10.3)
Chloride: 92 mmol/L — ABNORMAL LOW (ref 98–111)
Creatinine, Ser: 0.53 mg/dL (ref 0.44–1.00)
GFR, Estimated: >60 mL/min (ref >60)
Glucose, Bld: 150 mg/dL — ABNORMAL HIGH (ref 70–99)
Potassium: 2.4 mmol/L — CL (ref 3.5–5.1)
Sodium: 133 mmol/L — ABNORMAL LOW (ref 135–145)

## 2022-04-23 LAB — MAGNESIUM: Magnesium: 1.7 mg/dL (ref 1.7–2.4)

## 2022-04-23 MED ORDER — MAGNESIUM SULFATE 2 GM/50ML IV SOLN
2.0000 g | Freq: Once | INTRAVENOUS | Status: AC
Start: 2022-04-23 — End: 2022-04-24
  Administered 2022-04-24: 2 g via INTRAVENOUS
  Filled 2022-04-23: qty 50

## 2022-04-23 MED ORDER — POTASSIUM CHLORIDE 20 MEQ PO PACK
40.0000 meq | PACK | ORAL | Status: AC
  Administered 2022-04-23 (×2): 40 meq via ORAL
  Filled 2022-04-23 (×2): qty 2

## 2022-04-23 MED ORDER — POTASSIUM CHLORIDE 10 MEQ/100ML IV SOLN
10.0000 meq | INTRAVENOUS | Status: AC
  Administered 2022-04-23 (×5): 10 meq via INTRAVENOUS
  Filled 2022-04-23 (×5): qty 100

## 2022-04-23 MED ORDER — BOOST / RESOURCE BREEZE PO LIQD CUSTOM
1.0000 | Freq: Three times a day (TID) | ORAL | Status: DC
  Administered 2022-04-23: 1 via ORAL

## 2022-04-23 NOTE — Progress Notes (Addendum)
Patient FF:[id number] Edling      DOB: 10/26/1960      DJT:[id number]      Palliative Medicine Team    Subjective: Bedside symptom check completed. Husband bedside at time of visit.   Physical exam: Patient resting in bed at time of visit. Breathing even and non-labored, no excessive secretions noted. Patient without physical or non-verbal signs of pain or discomfort at this time. Patient endorses that she is tolerating her advanced diet well and looks forward to eating soft foods, specifically a biscuit tomorrow. She denies pain, nausea, vomiting. She does report what feels like gas and some abdominal distention, educated on burping of bag and G tube to help alleviate this. She has a formal education class for home tomorrow on this topic. She is very excited to go home and see her three miniature [place] shepards. Her only complaint this afternoon is that she feels very chilled, but this has been ongoing.    Assessment and plan: Bedside RN in room to administer medications at time of visit, she has no additional concerns or needs at this time. Husband without concerns as well. Will continue to follow for any changes or advances with hopes of discharge home with hospice support soon, patient and husband understanding and in agreement that full diet needs to be tolerated inpatient before going home. This RN will touch base with provider regarding TOC referral for home hospice agency referral. Will continue to check in daily to ensure smooth transition home.    Thank you for allowing the Palliative Medicine Team to assist in the care of this patient.     Damian Leavell, MSN, RN Palliative Medicine Team Team Phone: [phone]  This phone is monitored 7a-7p, please reach out to attending physician outside of these hours for urgent needs.

## 2022-04-23 NOTE — Progress Notes (Signed)
[facility] Progress Note  5 Days Post-Op  Subjective: CC-  Feeling fairly well this morning. Tolerating full liquids. G tube remains clamped. Denies worsening abdominal pain/bloating or any n/v. Colostomy productive.  Objective: Vital signs in last 24 hours: Temp:  [97.7 F (36.5 C)-99.1 F (37.3 C)] 99 F (37.2 C) (09/06 0408) Pulse Rate:  [105-108] 105 (09/06 0408) Resp:  [18-20] 18 (09/06 0408) BP: (102-106)/(63-68) 102/63 (09/06 0408) SpO2:  [94 %-99 %] 94 % (09/06 0408) Last BM Date : 04/21/22  Intake/Output from previous day: 09/05 0701 - 09/06 0700 In: 120 [P.O.:120] Out: 1650 [Urine:200; Drains:1050; Stool:400] Intake/Output this shift: No intake/output data recorded.  PE: Gen:  Alert, NAD, pleasant Abd: soft, mild distension, appropriately tender over incisions. Ostomy edematous and pink with small amount of thin liquid brown stool in pouch. G tube clamped.  Lab Results:  Recent Labs    04/22/22 2158 04/23/22 0310  WBC 13.9* 12.9*  HGB 11.1* 10.5*  HCT 33.3* 31.8*  PLT 79* 85*   BMET Recent Labs    04/21/22 0308  NA 132*  K 3.3*  CL 94*  CO2 31  GLUCOSE 95  BUN 14  CREATININE 0.39*  CALCIUM 7.8*   PT/INR No results for input(s): "LABPROT", "INR" in the last 72 hours. CMP     Component Value Date/Time   NA 132 (L) 04/21/2022 0308   K 3.3 (L) 04/21/2022 0308   CL 94 (L) 04/21/2022 0308   CO2 31 04/21/2022 0308   GLUCOSE 95 04/21/2022 0308   BUN 14 04/21/2022 0308   CREATININE 0.39 (L) 04/21/2022 0308   CREATININE 0.55 03/24/2022 1029   CALCIUM 7.8 (L) 04/21/2022 0308   PROT 4.8 (L) 04/21/2022 0308   ALBUMIN 1.9 (L) 04/21/2022 0308   AST 30 04/21/2022 0308   AST 28 03/24/2022 1029   ALT 26 04/21/2022 0308   ALT 19 03/24/2022 1029   ALKPHOS 76 04/21/2022 0308   BILITOT 2.4 (H) 04/21/2022 0308   BILITOT 1.8 (H) 03/24/2022 1029   GFRNONAA >60 04/21/2022 0308   GFRNONAA >60 03/24/2022 1029   GFRAA  06/26/2010 0755    >60         The eGFR has been calculated using the MDRD equation. This calculation has not been validated in all clinical situations. eGFR's persistently <60 mL/min signify possible Chronic Kidney Disease.   Lipase     Component Value Date/Time   LIPASE 44 04/03/2022 1126       Studies/Results: IR Sinus/Fist Tube Chk-Non GI  Result Date: 04/22/2022 CLINICAL DATA:  61 year old female with history of pelvic abscess, likely diverticular in etiology, with diminished output from indwelling transgluteal pelvic drain placed on 04/09/2022. Recent CT abdomen pelvis demonstrates near complete resolution of the fluid collection. The patient presents for drain injection and possible removal. EXAM: SINUS TRACT INJECTION/FISTULOGRAM COMPARISON:  04/17/2022, 04/09/2022 CONTRAST:  10 mL Omnipaque 300-administered via the existing percutaneous drain. FLUOROSCOPY TIME:  Twenty-two mGy TECHNIQUE: The patient was positioned in the right lateral decubitus position on the fluoroscopy table. A preprocedural spot fluoroscopic image was obtained of the pelvis and the existing percutaneous drainage catheter. Multiple spot fluoroscopic and radiographic images were obtained following the injection of a small amount of contrast via the existing percutaneous drainage catheter. FINDINGS: Trace residual pelvic fluid collection surrounding the indwelling pigtail drain. No evidence of colonic fistula. The drain was removed successfully without complication. IMPRESSION: 1. Trace residual left pelvic fluid collection without evidence of colonic fistula. 2. Successful  left transgluteal drain removal. Ruthann Cancer, MD [facility] Electronically Signed   By: Ruthann Cancer M.D.   On: 04/22/2022 16:14    Anti-infectives: Anti-infectives (From admission, onward)    Start     Dose/Rate Route Frequency Ordered Stop   04/14/22 1800  piperacillin-tazobactam (ZOSYN) IVPB 3.375 g   Status:  Discontinued        3.375 g 12.5 mL/hr over 240 Minutes Intravenous Every 8 hours 04/14/22 1224 04/21/22 1325   04/08/22 2100  ceFEPIme (MAXIPIME) 2 g in sodium chloride 0.9 % 100 mL IVPB  Status:  Discontinued        2 g 200 mL/hr over 30 Minutes Intravenous Every 12 hours 04/08/22 1453 04/14/22 1224   04/08/22 1600  metroNIDAZOLE (FLAGYL) IVPB 500 mg  Status:  Discontinued        500 mg 100 mL/hr over 60 Minutes Intravenous Every 12 hours 04/08/22 1453 04/14/22 1224   04/08/22 1515  piperacillin-tazobactam (ZOSYN) IVPB 3.375 g  Status:  Discontinued        3.375 g 12.5 mL/hr over 240 Minutes Intravenous Every 8 hours 04/08/22 1428 04/08/22 1447   04/07/22 2000  ceFEPIme (MAXIPIME) 2 g in sodium chloride 0.9 % 100 mL IVPB  Status:  Discontinued        2 g 200 mL/hr over 30 Minutes Intravenous Every 12 hours 04/07/22 1600 04/08/22 1428   04/04/22 0900  cefTRIAXone (ROCEPHIN) 2 g in sodium chloride 0.9 % 100 mL IVPB  Status:  Discontinued        2 g 200 mL/hr over 30 Minutes Intravenous Every 24 hours 04/04/22 0257 04/07/22 1600   04/03/22 1700  ceFEPIme (MAXIPIME) 2 g in sodium chloride 0.9 % 100 mL IVPB  Status:  Discontinued        2 g 200 mL/hr over 30 Minutes Intravenous Every 8 hours 04/03/22 1634 04/04/22 0257   04/03/22 1500  metroNIDAZOLE (FLAGYL) IVPB 500 mg  Status:  Discontinued        500 mg 100 mL/hr over 60 Minutes Intravenous Every 12 hours 04/03/22 1446 04/04/22 0257   04/03/22 1045  ceFEPIme (MAXIPIME) 2 g in sodium chloride 0.9 % 100 mL IVPB        2 g 200 mL/hr over 30 Minutes Intravenous  Once 04/03/22 1031 04/03/22 1429   04/03/22 1045  vancomycin (VANCOCIN) IVPB 1000 mg/200 mL premix        1,000 mg 200 mL/hr over 60 Minutes Intravenous  Once 04/03/22 1031 04/03/22 1241        Assessment/Plan Metastatic rectal cancer with complete obstruction -POD#5 s/p Diagnostic laparoscopy converted to exploratory laparotomy, placement of gastrostomy tube,  diverting transverse loop colostomy 9/1 Dr. Georgette Dover - keep G tube clamped and continue full liquids. Vent G tube as needed for any bloating or nausea/vomiting - ostomy productive. WOC following for new ostomy education  - mobilize, PT/OT - recommending home health therapies. 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

## 2022-04-23 NOTE — Progress Notes (Addendum)
PROGRESS NOTE  Angela Mcpherson  DPO:[id number] DOB: 05-24-1961 DOA: 04/03/2022 PCP: Default, Provider, MD   Brief Narrative: Patient is a 61 year old female with history of rectal cancer metastatic to liver and lung, hep C cirrhosis, aortic mural thrombus who presented here with abdominal pain and confusion.  Initially she was found to have colitis and was started on IV antibiotics.  Blood cultures showed E. coli.  CT abdomen/pelvis done showed left-sided pelvic abscess.  On 8/23, she underwent left transgluteal drain placement for pelvic abscess.  Hospital course remarkable for increasing abdominal pain.  General surgery was following and she underwent palliative diverting transverse loop colostomy and placement of a G tube on 9/1.  Currently on full liquid diet.  Plan is to discharge to home with hospice after adequate pain management and toleration of diet.  Assessment & Plan:  Active Problems:   E coli and citrobacter bacteremia   Pelvic abscess in female   Ileus vs bowel obstruction   Malignant neoplasm of rectosigmoid junction (HCC)   Acute metabolic encephalopathy   Aortic mural thrombus (HCC)   Thrombocytopenia (HCC)   Hepatitis C   Normocytic anemia   Myocardial injury   Acute urinary retention   Malnutrition of moderate degree   Hepatic cirrhosis (HCC)   Hypokalemia   Hypomagnesemia   Coagulopathy (HCC)   Hypophosphatemia   Partial intestinal obstruction (HCC)   Metastatic rectal cancer with complete obstruction: General surgery following.  Status post diagnostic laparoscopy converted to exploratory laparotomy, placement of G-tube, diverting transverse loop colostomy on 9/1.  General surgery recommending to keep the G-tube clamped.  Currently on full liquid.  Vent G-tube as needed for bloating, nausea, vomiting.  There is output from ostomy.  Wound care following for ostomy education. Plan is to discharge to home with hospice once symptoms are controlled.  IR removed the drain  on 9/5.  Has mild leukocytosis. Severe hypokalemia: Potassium of 2.4.  Aggressively supplemented, checking magnesium level.  Checking BMP tomorrow.  Persistent abdominal pain: Palliative care following for pain management and supportive care.  Continue current medications  Anasarca/ascites/Hep C cirrhosis/splenomegaly: Continue supportive care, very low albumin level.  Low-dose Lasix as needed.  Has mild thrombocytopenia, stable.  Acute metabolic encephalopathy: Mental status slowly improving.  Delirium precautions, frequent reorientation  Macrocytic anemia: Vitamin E31 and folic acid level normal.  History of aortic mural thrombus: Was on Eliquis, currently on hold  Acute urinary retention: Resolved  Severe protein calorie malnutrition: Nutritionist was following  Goals of care: Patient with metastatic rectal cancer.  Poor quality of life with poor prognosis.  Palliative care was following.  Plan is to discharge to home with hospice.  PT/OT recommend home health on discharge.     Nutrition Problem: Moderate Malnutrition Etiology: chronic illness, cancer and cancer related treatments    DVT prophylaxis:Place and maintain sequential compression device Start: 04/04/22 0946     Code Status: DNR  Family Communication: Husband at bedside  Patient status:Inpatient  Patient is from :Home  Anticipated discharge VQ:MGQQ with Hospice  Estimated DC date:1-2 days   Consultants: Surgery,palliative care  Procedures: Diverting colostomy, G-tube placement, pelvic drain placement  Antimicrobials:  Anti-infectives (From admission, onward)    Start     Dose/Rate Route Frequency Ordered Stop   04/14/22 1800  piperacillin-tazobactam (ZOSYN) IVPB 3.375 g  Status:  Discontinued        3.375 g 12.5 mL/hr over 240 Minutes Intravenous Every 8 hours 04/14/22 1224 04/21/22 1325   04/08/22 2100  ceFEPIme (MAXIPIME) 2 g in sodium chloride 0.9 % 100 mL IVPB  Status:  Discontinued        2  g 200 mL/hr over 30 Minutes Intravenous Every 12 hours 04/08/22 1453 04/14/22 1224   04/08/22 1600  metroNIDAZOLE (FLAGYL) IVPB 500 mg  Status:  Discontinued        500 mg 100 mL/hr over 60 Minutes Intravenous Every 12 hours 04/08/22 1453 04/14/22 1224   04/08/22 1515  piperacillin-tazobactam (ZOSYN) IVPB 3.375 g  Status:  Discontinued        3.375 g 12.5 mL/hr over 240 Minutes Intravenous Every 8 hours 04/08/22 1428 04/08/22 1447   04/07/22 2000  ceFEPIme (MAXIPIME) 2 g in sodium chloride 0.9 % 100 mL IVPB  Status:  Discontinued        2 g 200 mL/hr over 30 Minutes Intravenous Every 12 hours 04/07/22 1600 04/08/22 1428   04/04/22 0900  cefTRIAXone (ROCEPHIN) 2 g in sodium chloride 0.9 % 100 mL IVPB  Status:  Discontinued        2 g 200 mL/hr over 30 Minutes Intravenous Every 24 hours 04/04/22 0257 04/07/22 1600   04/03/22 1700  ceFEPIme (MAXIPIME) 2 g in sodium chloride 0.9 % 100 mL IVPB  Status:  Discontinued        2 g 200 mL/hr over 30 Minutes Intravenous Every 8 hours 04/03/22 1634 04/04/22 0257   04/03/22 1500  metroNIDAZOLE (FLAGYL) IVPB 500 mg  Status:  Discontinued        500 mg 100 mL/hr over 60 Minutes Intravenous Every 12 hours 04/03/22 1446 04/04/22 0257   04/03/22 1045  ceFEPIme (MAXIPIME) 2 g in sodium chloride 0.9 % 100 mL IVPB        2 g 200 mL/hr over 30 Minutes Intravenous  Once 04/03/22 1031 04/03/22 1429   04/03/22 1045  vancomycin (VANCOCIN) IVPB 1000 mg/200 mL premix        1,000 mg 200 mL/hr over 60 Minutes Intravenous  Once 04/03/22 1031 04/03/22 1241       Subjective: Patient seen and examined at the bedside today.  Appears comfortable, not in any Distress.  Denies any worsening abdominal pain, nausea or vomiting.  Lying in bed, very eager to go home.  She has liquid stool in the colostomy bag.  Objective: Vitals:   04/22/22 0556 04/22/22 1309 04/22/22 2057 04/23/22 0408  BP: 107/68 103/68 106/67 102/63  Pulse: (!) 105 (!) 105 (!) 108 (!) 105  Resp: '19  20 18 18  '$ Temp: 97.8 F (36.6 C) 97.7 F (36.5 C) 99.1 F (37.3 C) 99 F (37.2 C)  TempSrc: Oral Oral Oral Oral  SpO2: 95% 99% 97% 94%  Weight:      Height:        Intake/Output Summary (Last 24 hours) at 04/23/2022 1251 Last data filed at 04/23/2022 0600 Gross per 24 hour  Intake 120 ml  Output 600 ml  Net -480 ml   Filed Weights   04/04/22 0847 04/10/22 1247  Weight: 49.3 kg 62.7 kg    Examination:  General exam: Overall comfortable, not in distress, chronically ill looking, weak HEENT: PERRL Respiratory system:  no wheezes or crackles  Cardiovascular system: S1 & S2 heard, RRR.  Gastrointestinal system: Abdomen is distended, soft and nontender.  Bowel sounds present.  Colostomy bag filled with brown stool, venting G-tube Central nervous system: Alert and oriented Extremities: Severe bilateral extremity pitting edema, no clubbing ,no cyanosis Skin: No rashes, no ulcers,no icterus  Data Reviewed: I have personally reviewed following labs and imaging studies  CBC: Recent Labs  Lab 04/17/22 0317 04/18/22 0354 04/19/22 0314 04/22/22 2158 04/23/22 0310  WBC 7.0 8.1 10.7* 13.9* 12.9*  NEUTROABS 5.9  --   --   --   --   HGB 9.0* 9.3* 9.2* 11.1* 10.5*  HCT 27.4* 28.4* 28.2* 33.3* 31.8*  MCV 105.0* 105.6* 105.6* 105.4* 105.6*  PLT 78* 69* 74* 79* 85*   Basic Metabolic Panel: Recent Labs  Lab 04/17/22 0317 04/18/22 0354 04/19/22 0314 04/20/22 0314 04/21/22 0308 04/23/22 1100  NA 137 134* 134* 133* 132* 133*  K 3.8 4.1 4.2 4.3 3.3* 2.4*  CL 105 102 97* 94* 94* 92*  CO2 27 28 34* 32 31 34*  GLUCOSE 152* 165* 143* 99 95 150*  BUN '15 17 15 17 14 '$ 21*  CREATININE 0.42* 0.41* 0.39* 0.41* 0.39* 0.53  CALCIUM 8.0* 7.7* 8.0* 8.0* 7.8* 7.8*  MG 1.6* 1.7 1.5*  --   --   --   PHOS 2.4* 2.4* 2.4*  --   --   --      No results found for this or any previous visit (from the past 240 hour(s)).   Radiology Studies: IR Sinus/Fist Tube Chk-Non GI  Result Date:  04/22/2022 CLINICAL DATA:  61 year old female with history of pelvic abscess, likely diverticular in etiology, with diminished output from indwelling transgluteal pelvic drain placed on 04/09/2022. Recent CT abdomen pelvis demonstrates near complete resolution of the fluid collection. The patient presents for drain injection and possible removal. EXAM: SINUS TRACT INJECTION/FISTULOGRAM COMPARISON:  04/17/2022, 04/09/2022 CONTRAST:  10 mL Omnipaque 300-administered via the existing percutaneous drain. FLUOROSCOPY TIME:  Twenty-two mGy TECHNIQUE: The patient was positioned in the right lateral decubitus position on the fluoroscopy table. A preprocedural spot fluoroscopic image was obtained of the pelvis and the existing percutaneous drainage catheter. Multiple spot fluoroscopic and radiographic images were obtained following the injection of a small amount of contrast via the existing percutaneous drainage catheter. FINDINGS: Trace residual pelvic fluid collection surrounding the indwelling pigtail drain. No evidence of colonic fistula. The drain was removed successfully without complication. IMPRESSION: 1. Trace residual left pelvic fluid collection without evidence of colonic fistula. 2. Successful left transgluteal drain removal. Ruthann Cancer, MD [facility] Electronically Signed   By: Ruthann Cancer M.D.   On: 04/22/2022 16:14    Scheduled Meds:  Chlorhexidine Gluconate Cloth  6 each Topical Q0600   feeding supplement  1 Container Oral TID BM   hydrocortisone  25 mg Rectal BID   ketorolac  15 mg Intravenous Q8H   magic mouthwash  10 mL Oral QID   potassium chloride  40 mEq Oral Q2H   Continuous Infusions:  sodium chloride 10 mL/hr at 04/18/22 1058   potassium chloride       LOS: 20 days   Shelly Coss, MD [facility] P9/01/2022, 12:51 PM  PROGRESS NOTE  Angela Mcpherson  OMV:[id number] DOB: [birth year] DOA: 04/03/2022 PCP: Default,  Provider, MD   Brief Narrative:   Assessment & Plan:  Active Problems:   E coli and citrobacter bacteremia   Pelvic abscess in female   Ileus vs bowel obstruction   Malignant neoplasm of rectosigmoid junction (HCC)   Acute metabolic encephalopathy   Aortic mural thrombus (HCC)   Thrombocytopenia (HCC)   Hepatitis C   Normocytic anemia   Myocardial injury   Acute urinary retention   Malnutrition of moderate degree  Hepatic cirrhosis (HCC)   Hypokalemia   Hypomagnesemia   Coagulopathy (HCC)   Hypophosphatemia   Partial intestinal obstruction (HCC)      Nutrition Problem: Moderate Malnutrition Etiology: chronic illness, cancer and cancer related treatments    DVT prophylaxis:Place and maintain sequential compression device Start: 04/04/22 0946     Code Status: DNR  Family Communication:   Patient status:  Patient is from :  Anticipated discharge to:  Estimated DC date:   Consultants:   Procedures:  Antimicrobials:  Anti-infectives (From admission, onward)    Start     Dose/Rate Route Frequency Ordered Stop   04/14/22 1800  piperacillin-tazobactam (ZOSYN) IVPB 3.375 g  Status:  Discontinued        3.375 g 12.5 mL/hr over 240 Minutes Intravenous Every 8 hours 04/14/22 1224 04/21/22 1325   04/08/22 2100  ceFEPIme (MAXIPIME) 2 g in sodium chloride 0.9 % 100 mL IVPB  Status:  Discontinued        2 g 200 mL/hr over 30 Minutes Intravenous Every 12 hours 04/08/22 1453 04/14/22 1224   04/08/22 1600  metroNIDAZOLE (FLAGYL) IVPB 500 mg  Status:  Discontinued        500 mg 100 mL/hr over 60 Minutes Intravenous Every 12 hours 04/08/22 1453 04/14/22 1224   04/08/22 1515  piperacillin-tazobactam (ZOSYN) IVPB 3.375 g  Status:  Discontinued        3.375 g 12.5 mL/hr over 240 Minutes Intravenous Every 8 hours 04/08/22 1428 04/08/22 1447   04/07/22 2000  ceFEPIme (MAXIPIME) 2 g in sodium chloride 0.9 % 100 mL IVPB  Status:  Discontinued        2 g 200 mL/hr over 30  Minutes Intravenous Every 12 hours 04/07/22 1600 04/08/22 1428   04/04/22 0900  cefTRIAXone (ROCEPHIN) 2 g in sodium chloride 0.9 % 100 mL IVPB  Status:  Discontinued        2 g 200 mL/hr over 30 Minutes Intravenous Every 24 hours 04/04/22 0257 04/07/22 1600   04/03/22 1700  ceFEPIme (MAXIPIME) 2 g in sodium chloride 0.9 % 100 mL IVPB  Status:  Discontinued        2 g 200 mL/hr over 30 Minutes Intravenous Every 8 hours 04/03/22 1634 04/04/22 0257   04/03/22 1500  metroNIDAZOLE (FLAGYL) IVPB 500 mg  Status:  Discontinued        500 mg 100 mL/hr over 60 Minutes Intravenous Every 12 hours 04/03/22 1446 04/04/22 0257   04/03/22 1045  ceFEPIme (MAXIPIME) 2 g in sodium chloride 0.9 % 100 mL IVPB        2 g 200 mL/hr over 30 Minutes Intravenous  Once 04/03/22 1031 04/03/22 1429   04/03/22 1045  vancomycin (VANCOCIN) IVPB 1000 mg/200 mL premix        1,000 mg 200 mL/hr over 60 Minutes Intravenous  Once 04/03/22 1031 04/03/22 1241       Subjective:   Objective: Vitals:   04/22/22 0556 04/22/22 1309 04/22/22 2057 04/23/22 0408  BP: 107/68 103/68 106/67 102/63  Pulse: (!) 105 (!) 105 (!) 108 (!) 105  Resp: '19 20 18 18  '$ Temp: 97.8 F (36.6 C) 97.7 F (36.5 C) 99.1 F (37.3 C) 99 F (37.2 C)  TempSrc: Oral Oral Oral Oral  SpO2: 95% 99% 97% 94%  Weight:      Height:        Intake/Output Summary (Last 24 hours) at 04/23/2022 1251 Last data filed at 04/23/2022 0600 Gross per 24 hour  Intake 120 ml  Output 600 ml  Net -480 ml   Filed Weights   04/04/22 0847 04/10/22 1247  Weight: 49.3 kg 62.7 kg    Examination:  General exam: Overall comfortable, not in distress HEENT: PERRL Respiratory system:  no wheezes or crackles  Cardiovascular system: S1 & S2 heard, RRR.  Gastrointestinal system: Abdomen is nondistended, soft and nontender. Central nervous system: Alert and oriented Extremities: No edema, no clubbing ,no cyanosis Skin: No rashes, no ulcers,no icterus     Data  Reviewed: I have personally reviewed following labs and imaging studies  CBC: Recent Labs  Lab 04/17/22 0317 04/18/22 0354 04/19/22 0314 04/22/22 2158 04/23/22 0310  WBC 7.0 8.1 10.7* 13.9* 12.9*  NEUTROABS 5.9  --   --   --   --   HGB 9.0* 9.3* 9.2* 11.1* 10.5*  HCT 27.4* 28.4* 28.2* 33.3* 31.8*  MCV 105.0* 105.6* 105.6* 105.4* 105.6*  PLT 78* 69* 74* 79* 85*   Basic Metabolic Panel: Recent Labs  Lab 04/17/22 0317 04/18/22 0354 04/19/22 0314 04/20/22 0314 04/21/22 0308 04/23/22 1100  NA 137 134* 134* 133* 132* 133*  K 3.8 4.1 4.2 4.3 3.3* 2.4*  CL 105 102 97* 94* 94* 92*  CO2 27 28 34* 32 31 34*  GLUCOSE 152* 165* 143* 99 95 150*  BUN '15 17 15 17 14 '$ 21*  CREATININE 0.42* 0.41* 0.39* 0.41* 0.39* 0.53  CALCIUM 8.0* 7.7* 8.0* 8.0* 7.8* 7.8*  MG 1.6* 1.7 1.5*  --   --   --   PHOS 2.4* 2.4* 2.4*  --   --   --      No results found for this or any previous visit (from the past 240 hour(s)).   Radiology Studies: IR Sinus/Fist Tube Chk-Non GI  Result Date: 04/22/2022 CLINICAL DATA:  61 year old female with history of pelvic abscess, likely diverticular in etiology, with diminished output from indwelling transgluteal pelvic drain placed on 04/09/2022. Recent CT abdomen pelvis demonstrates near complete resolution of the fluid collection. The patient presents for drain injection and possible removal. EXAM: SINUS TRACT INJECTION/FISTULOGRAM COMPARISON:  04/17/2022, 04/09/2022 CONTRAST:  10 mL Omnipaque 300-administered via the existing percutaneous drain. FLUOROSCOPY TIME:  Twenty-two mGy TECHNIQUE: The patient was positioned in the right lateral decubitus position on the fluoroscopy table. A preprocedural spot fluoroscopic image was obtained of the pelvis and the existing percutaneous drainage catheter. Multiple spot fluoroscopic and radiographic images were obtained following the injection of a small amount of contrast via the existing percutaneous drainage catheter. FINDINGS:  Trace residual pelvic fluid collection surrounding the indwelling pigtail drain. No evidence of colonic fistula. The drain was removed successfully without complication. IMPRESSION: 1. Trace residual left pelvic fluid collection without evidence of colonic fistula. 2. Successful left transgluteal drain removal. Ruthann Cancer, MD [facility] Electronically Signed   By: Ruthann Cancer M.D.   On: 04/22/2022 16:14    Scheduled Meds:  Chlorhexidine Gluconate Cloth  6 each Topical Q0600   feeding supplement  1 Container Oral TID BM   hydrocortisone  25 mg Rectal BID   ketorolac  15 mg Intravenous Q8H   magic mouthwash  10 mL Oral QID   potassium chloride  40 mEq Oral Q2H   Continuous Infusions:  sodium chloride 10 mL/hr at 04/18/22 1058   potassium chloride       LOS: 20 days   Shelly Coss, MD [facility] P9/01/2022, 12:51 PM

## 2022-04-24 DIAGNOSIS — C19 Malignant neoplasm of rectosigmoid junction: Secondary | ICD-10-CM | POA: Diagnosis not present

## 2022-04-24 DIAGNOSIS — N739 Female pelvic inflammatory disease, unspecified: Secondary | ICD-10-CM | POA: Diagnosis not present

## 2022-04-24 LAB — CBC
HCT: 30 % — ABNORMAL LOW (ref 36.0–46.0)
Hemoglobin: 9.8 g/dL — ABNORMAL LOW (ref 12.0–15.0)
MCH: 34.5 pg — ABNORMAL HIGH (ref 26.0–34.0)
MCHC: 32.7 g/dL (ref 30.0–36.0)
MCV: 105.6 fL — ABNORMAL HIGH (ref 80.0–100.0)
Platelets: 83 10*3/uL — ABNORMAL LOW (ref 150–400)
RBC: 2.84 MIL/uL — ABNORMAL LOW (ref 3.87–5.11)
RDW: 21.9 % — ABNORMAL HIGH (ref 11.5–15.5)
WBC: 9.1 10*3/uL (ref 4.0–10.5)
nRBC: 0 % (ref 0.0–0.2)

## 2022-04-24 LAB — GLUCOSE, CAPILLARY
Glucose-Capillary: 104 mg/dL — ABNORMAL HIGH (ref 70–99)
Glucose-Capillary: 99 mg/dL (ref 70–99)

## 2022-04-24 LAB — BASIC METABOLIC PANEL
Anion gap: 6 (ref 5–15)
BUN: 17 mg/dL (ref 6–20)
CO2: 30 mmol/L (ref 22–32)
Calcium: 7.8 mg/dL — ABNORMAL LOW (ref 8.9–10.3)
Chloride: 95 mmol/L — ABNORMAL LOW (ref 98–111)
Creatinine, Ser: 0.41 mg/dL — ABNORMAL LOW (ref 0.44–1.00)
GFR, Estimated: >60 mL/min (ref >60)
Glucose, Bld: 99 mg/dL (ref 70–99)
Potassium: 3.9 mmol/L (ref 3.5–5.1)
Sodium: 131 mmol/L — ABNORMAL LOW (ref 135–145)

## 2022-04-24 NOTE — Progress Notes (Signed)
PT Cancellation Note  Patient Details Name: Angela Mcpherson MRN: [id number] DOB: 07/18/1961   Cancelled Treatment:    Reason Eval/Treat Not Completed: Patient declined, states that she is going home today. Has all DME needs [facility] Office [phone] Weekend [phone]    Claretha Cooper 04/24/2022, 10:53 AM

## 2022-04-24 NOTE — Progress Notes (Signed)
[facility] Progress Note  6 Days Post-Op  Subjective: CC-  Smiling this morning. No complaints. Hoping to have a biscuit today. Tolerating full liquids. Denies n/v. Colostomy productive.  Objective: Vital signs in last 24 hours: Temp:  [98.3 F (36.8 C)-99.2 F (37.3 C)] 98.3 F (36.8 C) (09/07 0628) Pulse Rate:  [100-103] 100 (09/07 0628) Resp:  [16-20] 17 (09/07 0628) BP: (96-101)/(64-65) 99/65 (09/07 0628) SpO2:  [94 %-98 %] 94 % (09/07 0628) Weight:  [62.8 kg] 62.8 kg (09/07 0628) Last BM Date : 04/23/22  Intake/Output from previous day: 09/06 0701 - 09/07 0700 In: 110 [P.O.:60; IV Piggyback:50] Out: 725 [Stool:725] Intake/Output this shift: Total I/O In: -  Out: 100 [Stool:100]  PE: Gen:  Alert, NAD, pleasant Abd: soft, mild distension, appropriately tender over incisions. Ostomy edematous and pink with small amount of thin liquid brown stool in pouch. G tube clamped.  Lab Results:  Recent Labs    04/23/22 0310 04/24/22 0329  WBC 12.9* 9.1  HGB 10.5* 9.8*  HCT 31.8* 30.0*  PLT 85* 83*   BMET Recent Labs    04/23/22 1100 04/24/22 0329  NA 133* 131*  K 2.4* 3.9  CL 92* 95*  CO2 34* 30  GLUCOSE 150* 99  BUN 21* 17  CREATININE 0.53 0.41*  CALCIUM 7.8* 7.8*   PT/INR No results for input(s): "LABPROT", "INR" in the last 72 hours. CMP     Component Value Date/Time   NA 131 (L) 04/24/2022 0329   K 3.9 04/24/2022 0329   CL 95 (L) 04/24/2022 0329   CO2 30 04/24/2022 0329   GLUCOSE 99 04/24/2022 0329   BUN 17 04/24/2022 0329   CREATININE 0.41 (L) 04/24/2022 0329   CREATININE 0.55 03/24/2022 1029   CALCIUM 7.8 (L) 04/24/2022 0329   PROT 4.8 (L) 04/21/2022 0308   ALBUMIN 1.9 (L) 04/21/2022 0308   AST 30 04/21/2022 0308   AST 28 03/24/2022 1029   ALT 26 04/21/2022 0308   ALT 19 03/24/2022 1029   ALKPHOS 76 04/21/2022 0308   BILITOT 2.4 (H) 04/21/2022 0308   BILITOT 1.8 (H) 03/24/2022 1029   GFRNONAA >60 04/24/2022 0329   GFRNONAA  >60 03/24/2022 1029   GFRAA  06/26/2010 0755    >60        The eGFR has been calculated using the MDRD equation. This calculation has not been validated in all clinical situations. eGFR's persistently <60 mL/min signify possible Chronic Kidney Disease.   Lipase     Component Value Date/Time   LIPASE 44 04/03/2022 1126       Studies/Results: IR Sinus/Fist Tube Chk-Non GI  Result Date: 04/22/2022 CLINICAL DATA:  61 year old female with history of pelvic abscess, likely diverticular in etiology, with diminished output from indwelling transgluteal pelvic drain placed on 04/09/2022. Recent CT abdomen pelvis demonstrates near complete resolution of the fluid collection. The patient presents for drain injection and possible removal. EXAM: SINUS TRACT INJECTION/FISTULOGRAM COMPARISON:  04/17/2022, 04/09/2022 CONTRAST:  10 mL Omnipaque 300-administered via the existing percutaneous drain. FLUOROSCOPY TIME:  Twenty-two mGy TECHNIQUE: The patient was positioned in the right lateral decubitus position on the fluoroscopy table. A preprocedural spot fluoroscopic image was obtained of the pelvis and the existing percutaneous drainage catheter. Multiple spot fluoroscopic and radiographic images were obtained following the injection of a small amount of contrast via the existing percutaneous drainage catheter. FINDINGS: Trace residual pelvic fluid collection surrounding the indwelling pigtail drain. No evidence of colonic fistula. The drain was removed  successfully without complication. IMPRESSION: 1. Trace residual left pelvic fluid collection without evidence of colonic fistula. 2. Successful left transgluteal drain removal. Ruthann Cancer, MD [facility] Radiology Electronically Signed   By: Ruthann Cancer M.D.   On: 04/22/2022 16:14    Anti-infectives: Anti-infectives (From admission, onward)    Start     Dose/Rate Route Frequency Ordered Stop   04/14/22  1800  piperacillin-tazobactam (ZOSYN) IVPB 3.375 g  Status:  Discontinued        3.375 g 12.5 mL/hr over 240 Minutes Intravenous Every 8 hours 04/14/22 1224 04/21/22 1325   04/08/22 2100  ceFEPIme (MAXIPIME) 2 g in sodium chloride 0.9 % 100 mL IVPB  Status:  Discontinued        2 g 200 mL/hr over 30 Minutes Intravenous Every 12 hours 04/08/22 1453 04/14/22 1224   04/08/22 1600  metroNIDAZOLE (FLAGYL) IVPB 500 mg  Status:  Discontinued        500 mg 100 mL/hr over 60 Minutes Intravenous Every 12 hours 04/08/22 1453 04/14/22 1224   04/08/22 1515  piperacillin-tazobactam (ZOSYN) IVPB 3.375 g  Status:  Discontinued        3.375 g 12.5 mL/hr over 240 Minutes Intravenous Every 8 hours 04/08/22 1428 04/08/22 1447   04/07/22 2000  ceFEPIme (MAXIPIME) 2 g in sodium chloride 0.9 % 100 mL IVPB  Status:  Discontinued        2 g 200 mL/hr over 30 Minutes Intravenous Every 12 hours 04/07/22 1600 04/08/22 1428   04/04/22 0900  cefTRIAXone (ROCEPHIN) 2 g in sodium chloride 0.9 % 100 mL IVPB  Status:  Discontinued        2 g 200 mL/hr over 30 Minutes Intravenous Every 24 hours 04/04/22 0257 04/07/22 1600   04/03/22 1700  ceFEPIme (MAXIPIME) 2 g in sodium chloride 0.9 % 100 mL IVPB  Status:  Discontinued        2 g 200 mL/hr over 30 Minutes Intravenous Every 8 hours 04/03/22 1634 04/04/22 0257   04/03/22 1500  metroNIDAZOLE (FLAGYL) IVPB 500 mg  Status:  Discontinued        500 mg 100 mL/hr over 60 Minutes Intravenous Every 12 hours 04/03/22 1446 04/04/22 0257   04/03/22 1045  ceFEPIme (MAXIPIME) 2 g in sodium chloride 0.9 % 100 mL IVPB        2 g 200 mL/hr over 30 Minutes Intravenous  Once 04/03/22 1031 04/03/22 1429   04/03/22 1045  vancomycin (VANCOCIN) IVPB 1000 mg/200 mL premix        1,000 mg 200 mL/hr over 60 Minutes Intravenous  Once 04/03/22 1031 04/03/22 1241        Assessment/Plan Metastatic rectal cancer with complete obstruction -POD#6 s/p Diagnostic laparoscopy converted to  exploratory laparotomy, placement of gastrostomy tube, diverting transverse loop colostomy 9/1 Dr. Georgette Dover - advance to soft diet - G tube clamped, ok to vent PRN nausea - ostomy productive. WOC following for new ostomy education  - mobilize, PT/OT - recommending home health therapies. Plan is home with hospice once symptoms controlled - IR drain removed 9/5 - ok for discharge today from surgical standpoint. 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

## 2022-04-24 NOTE — Progress Notes (Signed)
PROGRESS NOTE  Angela Mcpherson  TDV:[id number] DOB: [birth year] DOA: 04/03/2022 PCP: Default, Provider, MD   Brief Narrative: Patient is a 61 year old female with history of rectal cancer metastatic to liver and lung, hep C cirrhosis, aortic mural thrombus who presented here with abdominal pain and confusion.  Initially she was found to have colitis and was started on IV antibiotics.  Blood cultures showed E. coli.  CT abdomen/pelvis done showed left-sided pelvic abscess.  On 8/23, she underwent left transgluteal drain placement for pelvic abscess.  Hospital course remarkable for increasing abdominal pain.  General surgery was following and she underwent palliative diverting transverse loop colostomy and placement of a G tube on 9/1.  Currently on soft diet.  Plan is to discharge to home with hospice after management of equipment/hospice at home.  Medically stable for discharge  Assessment & Plan:  Active Problems:   E coli and citrobacter bacteremia   Pelvic abscess in female   Ileus vs bowel obstruction   Malignant neoplasm of rectosigmoid junction (HCC)   Acute metabolic encephalopathy   Aortic mural thrombus (HCC)   Thrombocytopenia (HCC)   Hepatitis C   Normocytic anemia   Myocardial injury   Acute urinary retention   Malnutrition of moderate degree   Hepatic cirrhosis (HCC)   Hypokalemia   Hypomagnesemia   Coagulopathy (HCC)   Hypophosphatemia   Partial intestinal obstruction (HCC)   Metastatic rectal cancer with complete obstruction: General surgery following.  Status post diagnostic laparoscopy converted to exploratory laparotomy, placement of G-tube, diverting transverse loop colostomy on 9/1.   There is output from ostomy.  Wound care following for ostomy education. Plan is to discharge to home with hospice once equipment, hospice arranged .  Ostomy nurse trying to educate husband on ostomy IR removed the drain on 9/5.    Severe hypokalemia: Supplemented and  corrected  Persistent abdominal pain: Palliative care following for pain management and supportive care.  Continue current medications  Anasarca/ascites/Hep C cirrhosis/splenomegaly: Continue supportive care, very low albumin level.  Low-dose Lasix as needed.  Has mild thrombocytopenia, stable.  Acute metabolic encephalopathy: Currently alert and oriented  Macrocytic anemia: Vitamin G62 and folic acid level normal.  History of aortic mural thrombus: Was on Eliquis, currently on hold  Acute urinary retention: Resolved  Severe protein calorie malnutrition: Nutritionist was following  Goals of care: Patient with metastatic rectal cancer.  Poor quality of life with poor prognosis.  Palliative care was following.  Plan is to discharge to home with hospice.      Nutrition Problem: Moderate Malnutrition Etiology: chronic illness, cancer and cancer related treatments    DVT prophylaxis:Place and maintain sequential compression device Start: 04/04/22 0946     Code Status: DNR  Family Communication: Husband at bedside on 9/6  Patient status:Inpatient  Patient is from :Home  Anticipated discharge IR:[facility] with Hospice  Estimated DC date: As soon as possible   Consultants: Surgery,palliative care  Procedures: Diverting colostomy, G-tube placement, pelvic drain placement  Antimicrobials:  Anti-infectives (From admission, onward)    Start     Dose/Rate Route Frequency Ordered Stop   04/14/22 1800  piperacillin-tazobactam (ZOSYN) IVPB 3.375 g  Status:  Discontinued        3.375 g 12.5 mL/hr over 240 Minutes Intravenous Every 8 hours 04/14/22 1224 04/21/22 1325   04/08/22 2100  ceFEPIme (MAXIPIME) 2 g in sodium chloride 0.9 % 100 mL IVPB  Status:  Discontinued        2 g 200 mL/hr over  30 Minutes Intravenous Every 12 hours 04/08/22 1453 04/14/22 1224   04/08/22 1600  metroNIDAZOLE (FLAGYL) IVPB 500 mg  Status:  Discontinued        500 mg 100 mL/hr over 60 Minutes Intravenous  Every 12 hours 04/08/22 1453 04/14/22 1224   04/08/22 1515  piperacillin-tazobactam (ZOSYN) IVPB 3.375 g  Status:  Discontinued        3.375 g 12.5 mL/hr over 240 Minutes Intravenous Every 8 hours 04/08/22 1428 04/08/22 1447   04/07/22 2000  ceFEPIme (MAXIPIME) 2 g in sodium chloride 0.9 % 100 mL IVPB  Status:  Discontinued        2 g 200 mL/hr over 30 Minutes Intravenous Every 12 hours 04/07/22 1600 04/08/22 1428   04/04/22 0900  cefTRIAXone (ROCEPHIN) 2 g in sodium chloride 0.9 % 100 mL IVPB  Status:  Discontinued        2 g 200 mL/hr over 30 Minutes Intravenous Every 24 hours 04/04/22 0257 04/07/22 1600   04/03/22 1700  ceFEPIme (MAXIPIME) 2 g in sodium chloride 0.9 % 100 mL IVPB  Status:  Discontinued        2 g 200 mL/hr over 30 Minutes Intravenous Every 8 hours 04/03/22 1634 04/04/22 0257   04/03/22 1500  metroNIDAZOLE (FLAGYL) IVPB 500 mg  Status:  Discontinued        500 mg 100 mL/hr over 60 Minutes Intravenous Every 12 hours 04/03/22 1446 04/04/22 0257   04/03/22 1045  ceFEPIme (MAXIPIME) 2 g in sodium chloride 0.9 % 100 mL IVPB        2 g 200 mL/hr over 30 Minutes Intravenous  Once 04/03/22 1031 04/03/22 1429   04/03/22 1045  vancomycin (VANCOCIN) IVPB 1000 mg/200 mL premix        1,000 mg 200 mL/hr over 60 Minutes Intravenous  Once 04/03/22 1031 04/03/22 1241       Subjective: Patient seen and examined at the bedside today.  Hemodynamically stable.  Very comfortable.  Denies any abdomen pain, nausea or vomiting.  Excited to go home soon  Objective: Vitals:   04/23/22 1310 04/23/22 2002 04/24/22 0628 04/24/22 0930  BP: 1'01/65 96/64 99/65 '$ 104/70  Pulse: (!) 103 100 100 (!) 105  Resp: '20 16 17 17  '$ Temp: 98.7 F (37.1 C) 99.2 F (37.3 C) 98.3 F (36.8 C) 98.8 F (37.1 C)  TempSrc: Oral Oral Oral Oral  SpO2: 98% 95% 94% 97%  Weight:   62.8 kg   Height:        Intake/Output Summary (Last 24 hours) at 04/24/2022 1153 Last data filed at 04/24/2022 0932 Gross per 24 hour   Intake 110 ml  Output 825 ml  Net -715 ml   Filed Weights   04/04/22 0847 04/10/22 1247 04/24/22 0628  Weight: 49.3 kg 62.7 kg 62.8 kg    Examination:     General exam: Overall comfortable, not in distress, deconditioned, weak, pleasant female HEENT: PERRL Respiratory system:  no wheezes or crackles  Cardiovascular system: S1 & S2 heard, RRR.  Gastrointestinal system: Abdomen is nondistended, soft and nontender.  Colostomy, venting G-tube Central nervous system: Alert and oriented Extremities: Severe bilateral lower extremity pitting edema, no clubbing ,no cyanosis Skin: No rashes, no ulcers,no icterus     Data Reviewed: I have personally reviewed following labs and imaging studies  CBC: Recent Labs  Lab 04/18/22 0354 04/19/22 0314 04/22/22 2158 04/23/22 0310 04/24/22 0329  WBC 8.1 10.7* 13.9* 12.9* 9.1  HGB 9.3* 9.2* 11.1* 10.5*  9.8*  HCT 28.4* 28.2* 33.3* 31.8* 30.0*  MCV 105.6* 105.6* 105.4* 105.6* 105.6*  PLT 69* 74* 79* 85* 83*   Basic Metabolic Panel: Recent Labs  Lab 04/18/22 0354 04/19/22 0314 04/20/22 0314 04/21/22 0308 04/23/22 1100 04/24/22 0329  NA 134* 134* 133* 132* 133* 131*  K 4.1 4.2 4.3 3.3* 2.4* 3.9  CL 102 97* 94* 94* 92* 95*  CO2 28 34* 32 31 34* 30  GLUCOSE 165* 143* 99 95 150* 99  BUN '17 15 17 14 '$ 21* 17  CREATININE 0.41* 0.39* 0.41* 0.39* 0.53 0.41*  CALCIUM 7.7* 8.0* 8.0* 7.8* 7.8* 7.8*  MG 1.7 1.5*  --   --  1.7  --   PHOS 2.4* 2.4*  --   --   --   --      No results found for this or any previous visit (from the past 240 hour(s)).   Radiology Studies: IR Sinus/Fist Tube Chk-Non GI  Result Date: 04/22/2022 CLINICAL DATA:  61 year old female with history of pelvic abscess, likely diverticular in etiology, with diminished output from indwelling transgluteal pelvic drain placed on 04/09/2022. Recent CT abdomen pelvis demonstrates near complete resolution of the fluid collection. The patient presents for drain injection and  possible removal. EXAM: SINUS TRACT INJECTION/FISTULOGRAM COMPARISON:  04/17/2022, 04/09/2022 CONTRAST:  10 mL Omnipaque 300-administered via the existing percutaneous drain. FLUOROSCOPY TIME:  Twenty-two mGy TECHNIQUE: The patient was positioned in the right lateral decubitus position on the fluoroscopy table. A preprocedural spot fluoroscopic image was obtained of the pelvis and the existing percutaneous drainage catheter. Multiple spot fluoroscopic and radiographic images were obtained following the injection of a small amount of contrast via the existing percutaneous drainage catheter. FINDINGS: Trace residual pelvic fluid collection surrounding the indwelling pigtail drain. No evidence of colonic fistula. The drain was removed successfully without complication. IMPRESSION: 1. Trace residual left pelvic fluid collection without evidence of colonic fistula. 2. Successful left transgluteal drain removal. Ruthann Cancer, MD [facility] Radiology Electronically Signed   By: Ruthann Cancer M.D.   On: 04/22/2022 16:14    Scheduled Meds:  Chlorhexidine Gluconate Cloth  6 each Topical Q0600   feeding supplement  1 Container Oral TID BM   hydrocortisone  25 mg Rectal BID   ketorolac  15 mg Intravenous Q8H   magic mouthwash  10 mL Oral QID   Continuous Infusions:  sodium chloride 10 mL/hr at 04/18/22 1058     LOS: 21 days   Shelly Coss, MD [facility] P9/02/2022, 11:53 AM  PROGRESS NOTE  Angela Mcpherson  OVZ:[id number] DOB: 06/07/1961 DOA: 04/03/2022 PCP: Default, Provider, MD   Brief Narrative:   Assessment & Plan:  Active Problems:   E coli and citrobacter bacteremia   Pelvic abscess in female   Ileus vs bowel obstruction   Malignant neoplasm of rectosigmoid junction (HCC)   Acute metabolic encephalopathy   Aortic mural thrombus (HCC)   Thrombocytopenia (HCC)   Hepatitis C   Normocytic anemia   Myocardial injury   Acute urinary  retention   Malnutrition of moderate degree   Hepatic cirrhosis (HCC)   Hypokalemia   Hypomagnesemia   Coagulopathy (HCC)   Hypophosphatemia   Partial intestinal obstruction (HCC)      Nutrition Problem: Moderate Malnutrition Etiology: chronic illness, cancer and cancer related treatments    DVT prophylaxis:Place and maintain sequential compression device Start: 04/04/22 0946     Code Status: DNR  Family Communication:   Patient status:  Patient is from :  Anticipated discharge to:  Estimated DC date:   Consultants:   Procedures:  Antimicrobials:  Anti-infectives (From admission, onward)    Start     Dose/Rate Route Frequency Ordered Stop   04/14/22 1800  piperacillin-tazobactam (ZOSYN) IVPB 3.375 g  Status:  Discontinued        3.375 g 12.5 mL/hr over 240 Minutes Intravenous Every 8 hours 04/14/22 1224 04/21/22 1325   04/08/22 2100  ceFEPIme (MAXIPIME) 2 g in sodium chloride 0.9 % 100 mL IVPB  Status:  Discontinued        2 g 200 mL/hr over 30 Minutes Intravenous Every 12 hours 04/08/22 1453 04/14/22 1224   04/08/22 1600  metroNIDAZOLE (FLAGYL) IVPB 500 mg  Status:  Discontinued        500 mg 100 mL/hr over 60 Minutes Intravenous Every 12 hours 04/08/22 1453 04/14/22 1224   04/08/22 1515  piperacillin-tazobactam (ZOSYN) IVPB 3.375 g  Status:  Discontinued        3.375 g 12.5 mL/hr over 240 Minutes Intravenous Every 8 hours 04/08/22 1428 04/08/22 1447   04/07/22 2000  ceFEPIme (MAXIPIME) 2 g in sodium chloride 0.9 % 100 mL IVPB  Status:  Discontinued        2 g 200 mL/hr over 30 Minutes Intravenous Every 12 hours 04/07/22 1600 04/08/22 1428   04/04/22 0900  cefTRIAXone (ROCEPHIN) 2 g in sodium chloride 0.9 % 100 mL IVPB  Status:  Discontinued        2 g 200 mL/hr over 30 Minutes Intravenous Every 24 hours 04/04/22 0257 04/07/22 1600   04/03/22 1700  ceFEPIme (MAXIPIME) 2 g in sodium chloride 0.9 % 100 mL IVPB  Status:  Discontinued        2 g 200 mL/hr  over 30 Minutes Intravenous Every 8 hours 04/03/22 1634 04/04/22 0257   04/03/22 1500  metroNIDAZOLE (FLAGYL) IVPB 500 mg  Status:  Discontinued        500 mg 100 mL/hr over 60 Minutes Intravenous Every 12 hours 04/03/22 1446 04/04/22 0257   04/03/22 1045  ceFEPIme (MAXIPIME) 2 g in sodium chloride 0.9 % 100 mL IVPB        2 g 200 mL/hr over 30 Minutes Intravenous  Once 04/03/22 1031 04/03/22 1429   04/03/22 1045  vancomycin (VANCOCIN) IVPB 1000 mg/200 mL premix        1,000 mg 200 mL/hr over 60 Minutes Intravenous  Once 04/03/22 1031 04/03/22 1241       Subjective:   Objective: Vitals:   04/23/22 1310 04/23/22 2002 04/24/22 0628 04/24/22 0930  BP: 1'01/65 96/64 99/65 '$ 104/70  Pulse: (!) 103 100 100 (!) 105  Resp: '20 16 17 17  '$ Temp: 98.7 F (37.1 C) 99.2 F (37.3 C) 98.3 F (36.8 C) 98.8 F (37.1 C)  TempSrc: Oral Oral Oral Oral  SpO2: 98% 95% 94% 97%  Weight:   62.8 kg   Height:        Intake/Output Summary (Last 24 hours) at 04/24/2022 1153 Last data filed at 04/24/2022 0932 Gross per 24 hour  Intake 110 ml  Output 825 ml  Net -715 ml   Filed Weights   04/04/22 0847 04/10/22 1247 04/24/22 0628  Weight: 49.3 kg 62.7 kg 62.8 kg    Examination:  General exam: Overall comfortable, not in distress HEENT: PERRL Respiratory system:  no wheezes or crackles  Cardiovascular system: S1 & S2 heard, RRR.  Gastrointestinal system: Abdomen is nondistended, soft and  nontender. Central nervous system: Alert and oriented Extremities: No edema, no clubbing ,no cyanosis Skin: No rashes, no ulcers,no icterus     Data Reviewed: I have personally reviewed following labs and imaging studies  CBC: Recent Labs  Lab 04/18/22 0354 04/19/22 0314 04/22/22 2158 04/23/22 0310 04/24/22 0329  WBC 8.1 10.7* 13.9* 12.9* 9.1  HGB 9.3* 9.2* 11.1* 10.5* 9.8*  HCT 28.4* 28.2* 33.3* 31.8* 30.0*  MCV 105.6* 105.6* 105.4* 105.6* 105.6*  PLT 69* 74* 79* 85* 83*   Basic Metabolic  Panel: Recent Labs  Lab 04/18/22 0354 04/19/22 0314 04/20/22 0314 04/21/22 0308 04/23/22 1100 04/24/22 0329  NA 134* 134* 133* 132* 133* 131*  K 4.1 4.2 4.3 3.3* 2.4* 3.9  CL 102 97* 94* 94* 92* 95*  CO2 28 34* 32 31 34* 30  GLUCOSE 165* 143* 99 95 150* 99  BUN '17 15 17 14 '$ 21* 17  CREATININE 0.41* 0.39* 0.41* 0.39* 0.53 0.41*  CALCIUM 7.7* 8.0* 8.0* 7.8* 7.8* 7.8*  MG 1.7 1.5*  --   --  1.7  --   PHOS 2.4* 2.4*  --   --   --   --      No results found for this or any previous visit (from the past 240 hour(s)).   Radiology Studies: IR Sinus/Fist Tube Chk-Non GI  Result Date: 04/22/2022 CLINICAL DATA:  61 year old female with history of pelvic abscess, likely diverticular in etiology, with diminished output from indwelling transgluteal pelvic drain placed on 04/09/2022. Recent CT abdomen pelvis demonstrates near complete resolution of the fluid collection. The patient presents for drain injection and possible removal. EXAM: SINUS TRACT INJECTION/FISTULOGRAM COMPARISON:  04/17/2022, 04/09/2022 CONTRAST:  10 mL Omnipaque 300-administered via the existing percutaneous drain. FLUOROSCOPY TIME:  Twenty-two mGy TECHNIQUE: The patient was positioned in the right lateral decubitus position on the fluoroscopy table. A preprocedural spot fluoroscopic image was obtained of the pelvis and the existing percutaneous drainage catheter. Multiple spot fluoroscopic and radiographic images were obtained following the injection of a small amount of contrast via the existing percutaneous drainage catheter. FINDINGS: Trace residual pelvic fluid collection surrounding the indwelling pigtail drain. No evidence of colonic fistula. The drain was removed successfully without complication. IMPRESSION: 1. Trace residual left pelvic fluid collection without evidence of colonic fistula. 2. Successful left transgluteal drain removal. Ruthann Cancer, MD [facility] Radiology  Electronically Signed   By: Ruthann Cancer M.D.   On: 04/22/2022 16:14    Scheduled Meds:  Chlorhexidine Gluconate Cloth  6 each Topical Q0600   feeding supplement  1 Container Oral TID BM   hydrocortisone  25 mg Rectal BID   ketorolac  15 mg Intravenous Q8H   magic mouthwash  10 mL Oral QID   Continuous Infusions:  sodium chloride 10 mL/hr at 04/18/22 1058     LOS: 21 days   Shelly Coss, MD [facility] P9/02/2022, 11:53 AM

## 2022-04-24 NOTE — TOC Progression Note (Addendum)
Transition of Care [facility]) - Progression Note    Patient Details  Name: Angela Mcpherson MRN: [id number] Date of Birth: [birth year]  Transition of Care [facility] CM/SW Contact  Roseanne Kaufman, RN Phone Number: 04/24/2022, 10:51 AM  Clinical Narrative:  Awaiting a call back from patient's husband to offer home hospice choice. Left hospice choice list in room.   TOC will continue to monitor.  - 4:02pm spoke with patient's husband who reports he will be at the hospital in the am. This RNCM offered choice and will email to patient's husband for review as he was driving.  TOC will continue to follow.  Expected Discharge Plan: Home w Hospice Care Barriers to Discharge: Continued Medical Work up  Expected Discharge Plan and Services Expected Discharge Plan: [place] In-house Referral: NA Discharge Planning Services: CM Consult Post Acute Care Choice: Home Health, Durable Medical Equipment Living arrangements for the past 2 months: Single Family Home                 DME Arranged: N/A DME Agency: NA       HH Arranged: NA HH Agency: NA         Social Determinants of Health (SDOH) Interventions    Readmission Risk Interventions    04/16/2022    2:57 PM 04/11/2022   11:52 AM  Readmission Risk Prevention Plan  Transportation Screening Complete Complete  PCP or Specialist Appt within 3-5 Days  Complete  HRI or [place]  Complete  Social Work Consult for [place] Planning/Counseling  Complete  Palliative Care Screening  Not Applicable  Medication Review Press photographer) Complete Complete  PCP or Specialist appointment within 3-5 days of discharge Complete   HRI or [place] Complete   SW Recovery Care/Counseling Consult Complete   Palliative Care Screening Complete   [place] Not Applicable

## 2022-04-24 NOTE — Progress Notes (Signed)
Angela Mcpherson   DOB:1961/04/16   HW#:[id number]   VEL#:[phone]  Oncology follow up   Subjective: Angela Mcpherson was sitting in bed for dinner when I saw her, she is tolerating oral intake well, pain is controlled. She is likely going home tomorrow. She was pleased when I visited her. Her husband was not at bedside.   Objective:  Vitals:   04/24/22 1339 04/24/22 2031  BP: 105/64 112/65  Pulse: 92 (!) 108  Resp: 18 18  Temp: 98.8 F (37.1 C) 100 F (37.8 C)  SpO2: 96% 98%    Body mass index is 23.76 kg/m.  Intake/Output Summary (Last 24 hours) at 04/24/2022 2134 Last data filed at 04/24/2022 1200 Gross per 24 hour  Intake 170 ml  Output 175 ml  Net -5 ml     Sclerae unicteric  (+) significant anasarca   Abdomen soft, mildly distended, (+) PEG and new ostomy   Neuro nonfocal    CBG (last 3)  Recent Labs    04/23/22 1146 04/24/22 0010 04/24/22 1306  GLUCAP 144* 99 104*     Labs:   Urine Studies No results for input(s): "UHGB", "CRYS" in the last 72 hours.  Invalid input(s): "UACOL", "UAPR", "USPG", "UPH", "UTP", "UGL", "UKET", "UBIL", "UNIT", "UROB", "ULEU", "UEPI", "UWBC", "URBC", "UBAC", "CAST", "UCOM", "BILUA"  Basic Metabolic Panel: Recent Labs  Lab 04/18/22 0354 04/19/22 0314 04/20/22 0314 04/21/22 0308 04/23/22 1100 04/24/22 0329  NA 134* 134* 133* 132* 133* 131*  K 4.1 4.2 4.3 3.3* 2.4* 3.9  CL 102 97* 94* 94* 92* 95*  CO2 28 34* 32 31 34* 30  GLUCOSE 165* 143* 99 95 150* 99  BUN '17 15 17 14 '$ 21* 17  CREATININE 0.41* 0.39* 0.41* 0.39* 0.53 0.41*  CALCIUM 7.7* 8.0* 8.0* 7.8* 7.8* 7.8*  MG 1.7 1.5*  --   --  1.7  --   PHOS 2.4* 2.4*  --   --   --   --    GFR Estimated Creatinine Clearance: 64.6 mL/min (A) (by C-G formula based on SCr of 0.41 mg/dL (L)). Liver Function Tests: Recent Labs  Lab 04/18/22 0354 04/19/22 0314 04/20/22 0314 04/21/22 0308  AST 40 40 41 30  ALT 24 29 32 26  ALKPHOS 64 66 77 76  BILITOT 1.4* 1.4* 1.9* 2.4*  PROT 5.1* 5.1*  5.2* 4.8*  ALBUMIN 1.8* 2.0* 2.2* 1.9*   No results for input(s): "LIPASE", "AMYLASE" in the last 168 hours.  No results for input(s): "AMMONIA" in the last 168 hours.   Coagulation profile Recent Labs  Lab 04/18/22 0354  INR 1.8*    CBC: Recent Labs  Lab 04/18/22 0354 04/19/22 0314 04/22/22 2158 04/23/22 0310 04/24/22 0329  WBC 8.1 10.7* 13.9* 12.9* 9.1  HGB 9.3* 9.2* 11.1* 10.5* 9.8*  HCT 28.4* 28.2* 33.3* 31.8* 30.0*  MCV 105.6* 105.6* 105.4* 105.6* 105.6*  PLT 69* 74* 79* 85* 83*   Cardiac Enzymes: No results for input(s): "CKTOTAL", "CKMB", "CKMBINDEX", "TROPONINI" in the last 168 hours. BNP: Invalid input(s): "POCBNP" CBG: Recent Labs  Lab 04/23/22 0016 04/23/22 0658 04/23/22 1146 04/24/22 0010 04/24/22 1306  GLUCAP 138* 107* 144* 99 104*   D-Dimer No results for input(s): "DDIMER" in the last 72 hours. Hgb A1c No results for input(s): "HGBA1C" in the last 72 hours. Lipid Profile No results for input(s): "CHOL", "HDL", "LDLCALC", "TRIG", "CHOLHDL", "LDLDIRECT" in the last 72 hours.  Thyroid function studies No results for input(s): "TSH", "T4TOTAL", "T3FREE", "THYROIDAB" in the last 72  hours.  Invalid input(s): "FREET3" Anemia work up No results for input(s): "VITAMINB12", "FOLATE", "FERRITIN", "TIBC", "IRON", "RETICCTPCT" in the last 72 hours.  Microbiology No results found for this or any previous visit (from the past 240 hour(s)).     Studies:  No results found.  Assessment: 61 y.o. female   Sepsis secondary to Ecoli colitis and bacteremia Acute colitis with sigmoid colon perforation and bowel obstruction, s/p diverting colostomy and PEG placement Metabolic encephalopathy due to sepsis, fluctuating  Metastatic colon cancer, chemotherapy on hold at this point  Normocytic anemia and thrombocytopenia, due to underlying malignancy and chemotherapy Liver cirrhosis, HCV infection   Plan:  -she has recovered reasonable well, pt and her  husband have agreed with home hospice, which I totally agree with.  -I will call her husband, and support their decision.  -I will be happy to be her attending if needed when she is under hospice care.    Truitt Merle, MD 04/24/2022

## 2022-04-25 DIAGNOSIS — N739 Female pelvic inflammatory disease, unspecified: Secondary | ICD-10-CM | POA: Diagnosis not present

## 2022-04-25 MED ORDER — OXYCODONE HCL 5 MG PO TABS: 5.0000 mg | ORAL_TABLET | ORAL | 0 refills | Status: AC | PRN

## 2022-04-25 NOTE — Consult Note (Signed)
[place] Nurse ostomy follow up Stoma type/location: RUQ, loop colostomy  Stomal assessment/size: >2", pink, moist  Peristomal assessment: intact  Treatment options for stomal/peristomal skin: NA Output; liquid brown Ostomy pouching: 1pc. Flat drainable  Education provided:  Allowed husband to perform pouch change, did with minimal assistance.  Patient participated cleaning her skin. Stressed importance of cleaning spout with wick and they were able to visualize a leak that had started this am.   Over one hour spent with patient and her husband for education, materials in the room for DC; 8 1pc flat pouches for DC to home with hospice care  Enrolled patient in [place] Start Discharge program: Yes, husband brought kit into the room this am.   Provided my contact information for the patient and spouse.   [place] Nurse will follow along with you for continued support with ostomy teaching and care [place] MSN, RN, [place], [place], [place]

## 2022-04-25 NOTE — Progress Notes (Signed)
PT Cancellation Note  Patient Details Name: Angela Mcpherson MRN: [id number] DOB: January 18, 1961   Cancelled Treatment:    Reason Eval/Treat Not Completed: Other (comment); Plan is to go  home with hospice, [place] has recovered well, able to mobilize with spouse present at overall supervision level on last session of PT.  PT signing off.   [facility] 04/25/2022, 9:31 AM

## 2022-04-25 NOTE — TOC Progression Note (Signed)
Transition of Care [facility]) - Progression Note    Patient Details  Name: Angela Mcpherson MRN: [id number] Date of Birth: September 09, 1960  Transition of Care [facility]) CM/SW Contact  Roseanne Kaufman, RN Phone Number: 04/25/2022, 9:44 AM  Clinical Narrative:   Patient and husband offered choice for home hospice, [email or web address]. Patient and husband chose [facility] for home hospice.   Referral made to [facility] with [facility], awaiting review and outcome.   TOC will continue to follow.     Expected Discharge Plan: Home w Hospice Care Barriers to Discharge: Continued Medical Work up  Expected Discharge Plan and Services Expected Discharge Plan: [place] In-house Referral: NA Discharge Planning Services: CM Consult Post Acute Care Choice: Home Health, Durable Medical Equipment Living arrangements for the past 2 months: Single Family Home                 DME Arranged: N/A DME Agency: NA       HH Arranged: NA HH Agency: Hospice and [place] Date [place]: 04/25/22 Time [place]: [phone] Representative spoke with at [place]: [place] (SDOH) Interventions    Readmission Risk Interventions    04/16/2022    2:57 PM 04/11/2022   11:52 AM  Readmission Risk Prevention Plan  Transportation Screening Complete Complete  PCP or Specialist Appt within 3-5 Days  Complete  HRI or [place]  Complete  Social Work Consult for [place] Planning/Counseling  Complete  Palliative Care Screening  Not Applicable  Medication Review Press photographer) Complete Complete  PCP or Specialist appointment within 3-5 days of discharge Complete   HRI or [place] Complete   SW Recovery Care/Counseling Consult Complete   Palliative Care Screening Complete   [place] Not Applicable

## 2022-04-25 NOTE — Discharge Summary (Signed)
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  Initially she was found to have colitis and was started on IV antibiotics.  Blood cultures showed E. coli.  CT abdomen/pelvis done showed left-sided pelvic abscess.  On 8/23, she underwent left transgluteal drain placement for pelvic abscess.  Hospital course remarkable for increasing abdominal pain.  General surgery was following and she underwent palliative diverting transverse loop colostomy and placement of a G tube on 9/1.  Currently on soft diet.  Plan is to discharge to home with hospice .  Following problems were addressed during her hospitalization:   Metastatic rectal cancer with complete obstruction: General surgery following.  Status post diagnostic laparoscopy converted to exploratory laparotomy, placement of G-tube, diverting transverse loop colostomy on 9/1.   There is output from ostomy.  Wound care following for ostomy education.Sutures around G tube to be removed around 04/30/22. Plan is to discharge to home with hospice once equipment, hospice arranged .  Ostomy nurse  educated husband on ostomy IR removed the drain on 9/5.     Severe hypokalemia: Supplemented and corrected   Persistent abdominal pain: Palliative care were following for pain management and supportive care.  Continue current medications   Anasarca/ascites/Hep C cirrhosis/splenomegaly: Continue supportive care, very low albumin level.   Has mild thrombocytopenia, stable.   Acute metabolic encephalopathy: Currently alert and  oriented   Macrocytic anemia: Vitamin I45 and folic acid level normal.   History of aortic mural thrombus: On eliquis.  Can be resumed or discontinued as per hospice services   Acute urinary retention: Resolved   Severe protein calorie malnutrition: Nutritionist was following   Goals of care: Patient with metastatic rectal cancer.  Poor quality of life with poor prognosis.  Palliative care was following.  Plan is to discharge to home with hospice.     Discharge Diagnoses:  Active Problems:   E coli and citrobacter bacteremia   Pelvic abscess in female   Ileus vs bowel obstruction   Malignant neoplasm of rectosigmoid junction (HCC)   Acute metabolic encephalopathy   Aortic mural thrombus (HCC)   Thrombocytopenia (HCC)   Hepatitis C   Normocytic anemia   Myocardial injury   Acute urinary retention   Malnutrition of moderate degree   Hepatic cirrhosis (HCC)   Hypokalemia   Hypomagnesemia   Coagulopathy (HCC)   Hypophosphatemia   Partial intestinal obstruction [facility])    Discharge Instructions  Discharge Instructions     Amb Referral to Ostomy Clinic   Complete by: As directed    Reason for referral modifiers: Pre and post-operative counseling for ostomy management   Diet general   Complete by: As directed    Soft diet.Advance as tolerated   Discharge instructions   Complete by: As directed    1)Follow up with hospice services at home   Increase activity slowly   Complete by: As directed    No wound care   Complete by: As directed       Allergies as of 04/25/2022       Reactions   Codeine    unknown   Epinephrine (anaphylaxis)  unknown   Orange Fruit [citrus] Other (See Comments)   Blisters on face.   Streptogramins    unknown   Tomato Other (See Comments)   Blisters on face        Medication List     STOP taking these medications    diphenoxylate-atropine 2.5-0.025 MG tablet Commonly known as: LOMOTIL       TAKE these medications     acetaminophen 325 MG tablet Commonly known as: TYLENOL Take 650 mg by mouth every 6 (six) hours as needed for moderate pain.   capecitabine 500 MG tablet Commonly known as: XELODA TAKE 2 TABLETS EVERY 12 HOURS  FOR 14 DAYS ON THEN 7 DAYS OFF, TAKE WITH MEALS What changed:  how much to take how to take this when to take this additional instructions   cholecalciferol 25 MCG (1000 UNIT) tablet Commonly known as: VITAMIN D3 Take 1,000 Units by mouth daily.   Eliquis 5 MG Tabs tablet Generic drug: apixaban TAKE 1 TABLET BY MOUTH TWICE A DAY What changed: how much to take   Klor-Con M20 20 MEQ tablet Generic drug: potassium chloride SA TAKE 1 TABLET BY MOUTH TWICE A DAY What changed:  how much to take when to take this   loperamide 2 MG capsule Commonly known as: IMODIUM Take 1 capsule (2 mg total) by mouth as needed for diarrhea or loose stools. What changed: when to take this   ondansetron 8 MG tablet Commonly known as: Zofran Take 1 tablet (8 mg total) by mouth 2 (two) times daily as needed for refractory nausea / vomiting. Start on day 3 after chemotherapy. What changed: additional instructions   oxyCODONE 5 MG immediate release tablet Commonly known as: Oxy IR/ROXICODONE Take 1 tablet (5 mg total) by mouth every 4 (four) hours as needed for moderate pain.               Durable Medical Equipment  (From admission, onward)           Start     Ordered   04/10/22 1049  For home use only DME Walker rolling  Once       Question Answer Comment  Walker: With [employer] Wheels   Patient needs a walker to treat with the following condition Difficulty in walking, not elsewhere classified      04/10/22 1049            Follow-up Information     Donnie Mesa, MD. Call.   Specialty: General Surgery Why: As needed Contact information: [address] [place] [address] [phone]                Allergies  Allergen Reactions    Codeine     unknown   Epinephrine (Anaphylaxis)     unknown   Orange Fruit [Citrus] Other (See Comments)    Blisters on face.   Streptogramins     unknown   Tomato Other (See Comments)    Blisters on face    Consultations: Oncology, palliative care   Procedures/Studies: IR Sinus/Fist Tube Chk-Non GI  Result Date: 04/22/2022 CLINICAL DATA:  61 year old female with history of pelvic abscess, likely diverticular in etiology, with diminished output from indwelling transgluteal pelvic drain placed on 04/09/2022. Recent CT abdomen pelvis demonstrates near complete resolution of the fluid collection. The patient presents for drain injection and possible removal. EXAM: SINUS TRACT INJECTION/FISTULOGRAM COMPARISON:  04/17/2022, 04/09/2022 CONTRAST:  10 mL Omnipaque 300-administered via the existing percutaneous  drain. FLUOROSCOPY TIME:  Twenty-two mGy TECHNIQUE: The patient was positioned in the right lateral decubitus position on the fluoroscopy table. A preprocedural spot fluoroscopic image was obtained of the pelvis and the existing percutaneous drainage catheter. Multiple spot fluoroscopic and radiographic images were obtained following the injection of a small amount of contrast via the existing percutaneous drainage catheter. FINDINGS: Trace residual pelvic fluid collection surrounding the indwelling pigtail drain. No evidence of colonic fistula. The drain was removed successfully without complication. IMPRESSION: 1. Trace residual left pelvic fluid collection without evidence of colonic fistula. 2. Successful left transgluteal drain removal. Ruthann Cancer, MD [facility] Electronically Signed   By: Ruthann Cancer M.D.   On: 04/22/2022 16:14   CT ABDOMEN PELVIS W CONTRAST  Result Date: 04/17/2022 CLINICAL DATA:  Bowel obstruction/ileus. Left pelvic abscess with drain placed. History of colorectal cancer. EXAM: CT ABDOMEN AND PELVIS WITH  CONTRAST TECHNIQUE: Multidetector CT imaging of the abdomen and pelvis was performed using the standard protocol following bolus administration of intravenous contrast. RADIATION DOSE REDUCTION: This exam was performed according to the departmental dose-optimization program which includes automated exposure control, adjustment of the mA and/or kV according to patient size and/or use of iterative reconstruction technique. CONTRAST:  159m OMNIPAQUE IOHEXOL 300 MG/ML  SOLN COMPARISON:  04/08/2022. FINDINGS: Lower chest: There are small bilateral pleural effusions with consolidation at the lung bases. The heart is normal in size with no pericardial effusion. The distal tip of a central venous catheter terminates in the right atrium. Scattered coronary artery calcifications are noted. Hepatobiliary: The liver is small and has a slightly nodular contour suggesting underlying cirrhosis. No focal abnormality is seen. Gallbladder is without stones. Biliary ductal dilatation. Pancreas: Unremarkable. No pancreatic ductal dilatation or surrounding inflammatory changes. Spleen: The spleen is enlarged measuring 14.3 cm in length. Adrenals/Urinary Tract: No adrenal nodule or mass. The kidneys enhance symmetrically. No renal calculus or hydronephrosis. The bladder is unremarkable. Stomach/Bowel: The stomach is nondistended. An enteric tube loops in the gastric body and terminates at the fundus. Multiple distended loops of small bowel are noted in the abdomen and pelvis measuring up to 4.0 cm in diameter. No definite transition point is seen. Scattered diverticula are present along the colon without evidence of diverticulitis. Air-fluid levels are present in the colon. The appendix is normal in caliber and contains calcifications. No free air or pneumatosis. Vascular/Lymphatic: Aortic atherosclerosis. Enlarged lymph node is present in the obturator space on the left measuring 3.6 x 2.0 cm. Reproductive: Uterus and bilateral adnexa  are unremarkable. Other: Moderate ascites. A percutaneous drain terminates in the pelvis on the left. No residual abscess is seen. Musculoskeletal: Diffuse anasarca.  No acute osseous abnormality. IMPRESSION: 1. Multiple loops of dilated small bowel in the abdomen, slightly increased from the prior exam. Findings may represent ileus versus small-bowel obstruction. No free air. 2. Left pelvic drain with no evidence of residual abscess. 3. Moderate ascites. 4. Small bilateral pleural effusions with consolidation at the lung bases. 5. Small liver with subtle nodularity inferiorly, possible underlying cirrhosis. 6. Splenomegaly. 7. Stable enlarged lymph node in the obturator space on the left. 8. Anasarca. 9. Aortic atherosclerosis. Electronically Signed   By: LBrett FairyM.D.   On: 04/17/2022 01:37   DG Abd 1 View  Result Date: 04/14/2022 CLINICAL DATA:  Check gastric catheter placement EXAM: ABDOMEN - 1 VIEW COMPARISON:  Film from the previous day. FINDINGS: Gastric catheter is again coiled within the stomach. Scattered  large and small bowel gas is noted with persistent small bowel dilatation. Pigtail catheter is noted in the pelvis consistent with recent pelvic abscess drainage IMPRESSION: Gastric catheter within the stomach. Stable gaseous distension of the large and small bowel. Electronically Signed   By: Inez Catalina M.D.   On: 04/14/2022 02:52   [place] EKG SITE RITE  Result Date: 04/13/2022 If [facility] image not attached, placement could not be confirmed due to current cardiac rhythm.  DG Abd 1 View  Result Date: 04/13/2022 CLINICAL DATA:  Evaluate NG tube EXAM: ABDOMEN - 1 VIEW COMPARISON:  April 12, 2022 FINDINGS: The NG tube is within the stomach. Air-filled dilated loops of large and small bowel again identified. IMPRESSION: The NG tube terminates in the stomach. Air-filled dilated loops of large and small bowel are again identified. The findings are most suggestive ileus. Electronically Signed    By: Dorise Bullion III M.D.   On: 04/13/2022 13:46   DG Abd 1 View  Result Date: 04/12/2022 CLINICAL DATA:  NG tube placement EXAM: ABDOMEN - 1 VIEW COMPARISON:  Study done earlier today FINDINGS: There is interval placement of enteric tube with its tip in the medial aspect of fundus of the stomach. Dilated bowel loops are seen in upper abdomen. There are linear densities in the lower lung fields suggesting possible subsegmental atelectasis. IMPRESSION: Tip of enteric tube is seen in the stomach. Dilation of bowel loops may be due to ileus or partial obstruction. Linear densities are seen in both lower lung fields suggesting atelectasis. Electronically Signed   By: Elmer Picker M.D.   On: 04/12/2022 15:13   DG Abd Portable 1V  Result Date: 04/12/2022 CLINICAL DATA:  Ileus EXAM: PORTABLE ABDOMEN - 1 VIEW COMPARISON:  04/10/2022 FINDINGS: Interval placement of percutaneous pigtail drainage catheter within the low left pelvis. Prominent air distended loops of large and small bowel throughout the abdomen. No gross free intraperitoneal air. No acute bony findings. IMPRESSION: Prominent air distended loops of large and small bowel throughout the abdomen, most suggestive of ileus. Distal obstruction not excluded. Continued radiographic follow-up recommended. Electronically Signed   By: Davina Poke D.O.   On: 04/12/2022 09:59   DG Abd Portable 1V  Result Date: 04/10/2022 CLINICAL DATA:  Abdominal pain EXAM: PORTABLE ABDOMEN - 1 VIEW COMPARISON:  04/08/2022 FINDINGS: Increased small bowel distension compared to prior, bowel loops dilated up to 6.9 cm. No gross intramural air. IMPRESSION: Worsening bowel distension compared to prior, ileus versus distal bowel obstruction. Electronically Signed   By: Donavan Foil M.D.   On: 04/10/2022 23:05   CT GUIDED VISCERAL FLUID DRAIN BY PERC CATH  Result Date: 04/09/2022 INDICATION: 61 year old female with a history of pelvic abscess referred for drainage  EXAM: CT GUIDED DRAINAGE OF PELVIC ABSCESS MEDICATIONS: The patient is currently admitted to the hospital and receiving intravenous antibiotics. The antibiotics were administered within an appropriate time frame prior to the initiation of the procedure. ANESTHESIA/SEDATION: 2.0 mg IV Versed 150 mcg IV Fentanyl Moderate Sedation Time:  22 minutes The patient was continuously monitored during the procedure by the interventional radiology nurse under my direct supervision. COMPLICATIONS: None TECHNIQUE: Informed written consent was obtained from the patient after a thorough discussion of the procedural risks, benefits and alternatives. All questions were addressed. Maximal Sterile Barrier Technique was utilized including caps, mask, sterile gowns, sterile gloves, sterile drape, hand hygiene and skin antiseptic. A timeout was performed prior to the initiation of the procedure. PROCEDURE: Patient was position  prone on the CT gantry table. Scout CT was acquired for planning purposes. The left gluteal region was prepped with Chlorhexidine in a sterile fashion, and a sterile drape was applied covering the operative field. A sterile gown and sterile gloves were used for the procedure. Local anesthesia was provided with 1% Lidocaine. Using CT guidance, a trocar needle was passed in a transgluteal approach, avoiding the major vasculature, and targeting the fluid and gas collection within the left pelvis. Once we confirmed needle tip position, stylet was removed and a wire was passed into the collection. Modified Seldinger technique was used to place a 10 French pigtail catheter. Aspiration approximately 60 cc of frankly feculent/purulent material was performed. Sample sent for culture. The drain was sutured in position and attached to bulb suction. Final CT was acquired. Patient tolerated the procedure well and remained hemodynamically stable throughout. No complications were encountered and no significant blood loss.  FINDINGS: Scout CT again demonstrates fluid and gas collection within the left pelvis. Left transgluteal approach was performed avoiding the major vasculature within the left hemipelvis. Pathologic lymphadenopathy is again noted. Final CT demonstrates decompression of the fluid and gas collection of the left pelvis. IMPRESSION: Status post CT-guided left transgluteal drain for left pelvic abscess. Signed, Dulcy Fanny. Nadene Rubins, RPVI [facility] Electronically Signed   By: Corrie Mckusick D.O.   On: 04/09/2022 16:53   CT ABDOMEN PELVIS W CONTRAST  Result Date: 04/08/2022 CLINICAL DATA:  Sepsis, persistent fever, abdominal pain. * Tracking Code: BO * EXAM: CT ABDOMEN AND PELVIS WITH CONTRAST TECHNIQUE: Multidetector CT imaging of the abdomen and pelvis was performed using the standard protocol following bolus administration of intravenous contrast. RADIATION DOSE REDUCTION: This exam was performed according to the departmental dose-optimization program which includes automated exposure control, adjustment of the mA and/or kV according to patient size and/or use of iterative reconstruction technique. CONTRAST:  167m OMNIPAQUE IOHEXOL 300 MG/ML  SOLN COMPARISON:  None Available. FINDINGS: Lower chest: Bibasilar atelectasis small effusions. Hepatobiliary: No focal hepatic lesion. New large volume of fluid surrounds the margin of the liver extends along the RIGHT pericolic gutter. Gallbladder distended without evidence inflammation. No biliary duct dilatation. Portal veins are patent. Pancreas: Pancreas is normal. No ductal dilatation. No pancreatic inflammation. Spleen: Normal spleen Adrenals/urinary tract: Adrenal glands normal. There is mild hydronephrosis hydroureter on the LEFT. Obstructive LEFT ureter likely related to abscess formation in the LEFT pelvis. Stomach/Bowel: Stomach proximal small bowel normal. Contrast flows proximal half way through the  small bowel. There is multiple air-fluid levels within the small bowel without significant dilatation. The ascending and transverse colon gas-filled relatively normal. No evidence of colitis seen on comparison exam. Within the LEFT lower quadrant just superior to the operator space is a contained fluid collection which has fluid and gas measuring 4.5 x 6.7 cm (image 82/2). Favor contained perforation of the bowel at rectosigmoid level small wiht extraluminal gas noted on image 79/2. No significant intraperitoneal free air. There is a large volume intraperitoneal free fluid. Margin of the free fluid is enhancing in the LEFT pericolic gutter (image 503/5 consistent peritonitis. Again demonstrated a mass at the rectosigmoid junction. Vascular/Lymphatic: Abdominal aorta is normal caliber with intimal calcification. Lymph node deep to the operator space again noted on the LEFT measuring 3.1 x 2.6 cm Reproductive: Grossly unremarkable Other: None Musculoskeletal: No aggressive osseous lesion. IMPRESSION: 1. Gas and fluid collection in the deep LEFT pelvis most consistent with perforation of the  rectosigmoid colon with contained abscess formation. 2. New large volume intraperitoneal free fluid. Evidence of peritonitis associated the free fluid. 3. Small bowel ileus. 4. No large volume intraperitoneal free air. 5. New mild hydro nephrosis and hydroureter on the LEFT presumed related to obstruction of LEFT ureter by the LEFT pelvic abscess. [facility] on 04/08/2022  at13:59. Electronically Signed   By: Suzy Bouchard M.D.   On: 04/08/2022 14:01   DG Abd 1 View  Result Date: 04/08/2022 CLINICAL DATA:  61 year old female history of abdominal distension. EXAM: ABDOMEN - 1 VIEW COMPARISON:  No prior abdominal radiograph. CT of the abdomen and pelvis 04/03/2022. FINDINGS: Diffuse gaseous distension is noted throughout the small bowel and colon. Small bowel loops measure up to 4.1 cm in the central  abdomen. Rectal gas is noted. No definite pneumoperitoneum confidently identified. IMPRESSION: Abnormal bowel-gas pattern, most suggestive of an ileus, as above. Electronically Signed   By: Vinnie Langton M.D.   On: 04/08/2022 07:07   ECHOCARDIOGRAM COMPLETE  Result Date: 04/06/2022    ECHOCARDIOGRAM REPORT   Patient Name:   Angela Mcpherson Date of Exam: 04/06/2022 Medical Rec #:  [id number]      Height:       64.0 in Accession #:    [id number]     Weight:       108.7 lb Date of Birth:  05-14-1961       BSA:          1.510 m Patient Age:    50 years       BP:           129/85 mmHg Patient Gender: F              HR:           116 bpm. Exam Location:  Inpatient Procedure: 2D Echo, Cardiac Doppler, Color Doppler and 3D Echo Indications:    Elevated troponins  History:        Patient has no prior history of Echocardiogram examinations.                 Arrythmias:Tachycardia.  Sonographer:    Merrie Roof RDCS Referring Phys: [place]  1. Left ventricular ejection fraction, by estimation, is 55 to 60%. Left ventricular ejection fraction by 3D volume is 63 %. The left ventricle has normal function. The left ventricle has no regional wall motion abnormalities.  2. Right ventricular systolic function is normal. The right ventricular size is normal. There is normal pulmonary artery systolic pressure. The estimated right ventricular systolic pressure is 20.9 mmHg.  3. Left atrial size was mildly dilated.  4. The mitral valve is normal in structure. No evidence of mitral valve regurgitation.  5. The aortic valve is tricuspid. There is mild calcification of the aortic valve. There is mild thickening of the aortic valve. Aortic valve regurgitation is not visualized. Aortic valve sclerosis/calcification is present, without any evidence of aortic stenosis.  6. The inferior vena cava is collapsed, consistent with low right atrial pressure. Conclusion(s)/Recommendation(s): Echo findings suggest hypovolemia.  FINDINGS  Left Ventricle: Impaired relaxation pattern may be due to hypovolemia (diastolic mitral annulus velocities are normal for age). Left ventricular ejection fraction, by estimation, is 55 to 60%. Left ventricular ejection fraction by 3D volume is 63 %. The  left ventricle has normal function. The left ventricle has no regional wall motion abnormalities. The left ventricular internal cavity size was normal in size. There is no left  ventricular hypertrophy. Right Ventricle: The right ventricular size is normal. No increase in right ventricular wall thickness. Right ventricular systolic function is normal. There is normal pulmonary artery systolic pressure. The tricuspid regurgitant velocity is 2.59 m/s, and  with an assumed right atrial pressure of 0 mmHg, the estimated right ventricular systolic pressure is 99.3 mmHg. Left Atrium: Left atrial size was mildly dilated. Right Atrium: Right atrial size was normal in size. Pericardium: There is no evidence of pericardial effusion. Mitral Valve: The mitral valve is normal in structure. No evidence of mitral valve regurgitation. Tricuspid Valve: The tricuspid valve is normal in structure. Tricuspid valve regurgitation is trivial. Aortic Valve: The aortic valve is tricuspid. There is mild calcification of the aortic valve. There is mild thickening of the aortic valve. Aortic valve regurgitation is not visualized. Aortic valve sclerosis/calcification is present, without any evidence of aortic stenosis. Aortic valve mean gradient measures 10.3 mmHg. Aortic valve peak gradient measures 18.6 mmHg. Aortic valve area, by VTI measures 1.91 cm. Pulmonic Valve: The pulmonic valve was not well visualized. Pulmonic valve regurgitation is not visualized. Aorta: The aortic root and ascending aorta are structurally normal, with no evidence of dilitation. Venous: The inferior vena cava is collapsed, consistent with low right atrial pressure. IAS/Shunts: The interatrial septum was  not well visualized.  LEFT VENTRICLE PLAX 2D LVIDd:         4.30 cm   Diastology LVIDs:         3.10 cm   LV e' medial:    7.29 cm/s LV PW:         1.00 cm   LV E/e' medial:  10.8 LV IVS:        1.10 cm   LV e' lateral:   10.70 cm/s LVOT diam:     2.00 cm   LV E/e' lateral: 7.4 LV SV:         62 LV SV Index:   41 LVOT Area:     3.14 cm                           3D Volume EF:                          3D EF:        63 %                          LV EDV:       103 ml                          LV ESV:       38 ml                          LV SV:        65 ml RIGHT VENTRICLE RV Basal diam:  2.60 cm RV S prime:     11.40 cm/s TAPSE (M-mode): 2.6 cm LEFT ATRIUM           Index        RIGHT ATRIUM           Index LA diam:      2.90 cm 1.92 cm/m   RA Area:     14.30 cm LA Vol (A2C): 38.7 ml 25.63 ml/m  RA Volume:  30.20 ml  20.00 ml/m LA Vol (A4C): 52.0 ml 34.44 ml/m  AORTIC VALVE AV Area (Vmax):    2.01 cm AV Area (Vmean):   1.91 cm AV Area (VTI):     1.91 cm AV Vmax:           215.67 cm/s AV Vmean:          150.000 cm/s AV VTI:            0.324 m AV Peak Grad:      18.6 mmHg AV Mean Grad:      10.3 mmHg LVOT Vmax:         138.00 cm/s LVOT Vmean:        91.200 cm/s LVOT VTI:          0.197 m LVOT/AV VTI ratio: 0.61  AORTA Ao Root diam: 3.00 cm Ao Asc diam:  2.70 cm MITRAL VALVE               TRICUSPID VALVE MV Area (PHT): 4.71 cm    TR Peak grad:   26.8 mmHg MV Decel Time: 161 msec    TR Vmax:        259.00 cm/s MV E velocity: 78.80 cm/s MV A velocity: 98.10 cm/s  SHUNTS MV E/A ratio:  0.80        Systemic VTI:  0.20 m                            Systemic Diam: 2.00 cm Dani Gobble Croitoru MD Electronically signed by Sanda Klein MD Signature Date/Time: 04/06/2022/1:20:24 PM    Final    CT Angio Chest PE W and/or Wo Contrast  Result Date: 04/03/2022 CLINICAL DATA:  Hypoxia. EXAM: CT ANGIOGRAPHY CHEST WITH CONTRAST TECHNIQUE: Multidetector CT imaging of the chest was performed using the standard protocol during bolus  administration of intravenous contrast. Multiplanar CT image reconstructions and MIPs were obtained to evaluate the vascular anatomy. RADIATION DOSE REDUCTION: This exam was performed according to the departmental dose-optimization program which includes automated exposure control, adjustment of the mA and/or kV according to patient size and/or use of iterative reconstruction technique. CONTRAST:  123m OMNIPAQUE IOHEXOL 300 MG/ML  SOLN COMPARISON:  March 18, 2022. FINDINGS: Cardiovascular: Satisfactory opacification of the pulmonary arteries to the segmental level. No evidence of pulmonary embolism. Normal heart size. No pericardial effusion. Mediastinum/Nodes: No enlarged mediastinal, hilar, or axillary lymph nodes. Thyroid gland, trachea, and esophagus demonstrate no significant findings. Lungs/Pleura: Lungs are clear. No pleural effusion or pneumothorax. Upper Abdomen: No acute abnormality. Musculoskeletal: No chest wall abnormality. No acute or significant osseous findings. Review of the MIP images confirms the above findings. IMPRESSION: No definite evidence of pulmonary embolus. No definite abnormality seen in the chest. Electronically Signed   By: JMarijo ConceptionM.D.   On: 04/03/2022 14:41   CT ABDOMEN PELVIS W CONTRAST  Result Date: 04/03/2022 CLINICAL DATA:  Weakness, hypoxia, metastatic colon cancer * Tracking Code: BO * EXAM: CT ABDOMEN AND PELVIS WITH CONTRAST TECHNIQUE: Multidetector CT imaging of the abdomen and pelvis was performed using the standard protocol following bolus administration of intravenous contrast. RADIATION DOSE REDUCTION: This exam was performed according to the departmental dose-optimization program which includes automated exposure control, adjustment of the mA and/or kV according to patient size and/or use of iterative reconstruction technique. CONTRAST:  1039mOMNIPAQUE IOHEXOL 300 MG/ML  SOLN COMPARISON:  CT chest abdomen pelvis, 03/18/2022 FINDINGS: Lower chest: No acute  abnormality. Please see separately reported examination of the chest. Hepatobiliary: No solid liver abnormality is seen. No gallstones, gallbladder wall thickening, or biliary dilatation. Pancreas: Unremarkable. No pancreatic ductal dilatation or surrounding inflammatory changes. Spleen: Splenomegaly, maximum coronal span 14.7 cm. Adrenals/Urinary Tract: Adrenal glands are unremarkable. Kidneys are normal, without renal calculi, solid lesion, or hydronephrosis. Mildly distended urinary bladder. Stomach/Bowel: Stomach is within normal limits. Appendix appears normal. New, diffuse, long segment wall thickening of the colon, involving the cecum through the splenic flexure (series 7, image 29, 17, series 4, image 55). Unchanged circumferential mass of the rectosigmoid junction (series 4, image 73). Vascular/Lymphatic: Aortic atherosclerosis. Unchanged left mesorectal or pelvic sidewall lymph node measuring 3.6 x 2.3 cm (series 4, image 79). Unchanged prominent subcentimeter retroperitoneal lymph nodes (series 4, image 46). Reproductive: No mass or other significant abnormality. Other: No abdominal wall hernia or abnormality. No ascites. Musculoskeletal: No acute or significant osseous findings. IMPRESSION: 1. New, diffuse, long segment wall thickening of the colon, involving the cecum through the splenic flexure. Findings are consistent with nonspecific infectious, inflammatory, or ischemic colitis. 2. Unchanged circumferential mass of the rectosigmoid junction. Unchanged enlarged left mesorectal or pelvic sidewall lymph node. 3. Distended urinary bladder.  Correlate for urinary retention. 4. Splenomegaly. Aortic Atherosclerosis (ICD10-I70.0). Electronically Signed   By: Delanna Ahmadi M.D.   On: 04/03/2022 14:40   DG Chest Port 1 View  Result Date: 04/03/2022 CLINICAL DATA:  Weakness EXAM: PORTABLE CHEST 1 VIEW COMPARISON:  11/18/2021 FINDINGS: The heart size and mediastinal contours are within normal limits. Both  lungs are clear. The visualized skeletal structures are unremarkable. IMPRESSION: No acute abnormality lungs in AP portable projection. Electronically Signed   By: Delanna Ahmadi M.D.   On: 04/03/2022 10:50      Subjective:  Patient seen and examined at the bedside today.  Hemodynamically stable.  Very comfortable.  Denies any abdomen pain, nausea or vomiting.  Very excited to go home  Discharge Exam: Vitals:   04/24/22 2031 04/25/22 0428  BP: 112/65 114/75  Pulse: (!) 108 (!) 104  Resp: 18 18  Temp: 100 F (37.8 C) 98.7 F (37.1 C)  SpO2: 98% 95%   Vitals:   04/24/22 0930 04/24/22 1339 04/24/22 2031 04/25/22 0428  BP: 104/70 105/64 112/65 114/75  Pulse: (!) 105 92 (!) 108 (!) 104  Resp: '17 18 18 18  '$ Temp: 98.8 F (37.1 C) 98.8 F (37.1 C) 100 F (37.8 C) 98.7 F (37.1 C)  TempSrc: Oral Oral Oral Oral  SpO2: 97% 96% 98% 95%  Weight:      Height:        General: Pt is alert, awake, not in acute distress Cardiovascular: RRR, S1/S2 +, no rubs, no gallops Respiratory: CTA bilaterally, no wheezing, no rhonchi Abdominal: Soft, NT, ND, bowel sounds +, colostomy, G-tube Extremities: no edema, no cyanosis    The results of significant diagnostics from this hospitalization (including imaging, microbiology, ancillary and laboratory) are listed below for reference.     Microbiology: No results found for this or any previous visit (from the past 240 hour(s)).   Labs: BNP (last 3 results) Recent Labs    05/26/21 1206  BNP 16.0   Basic Metabolic Panel: Recent Labs  Lab 04/19/22 0314 04/20/22 0314 04/21/22 0308 04/23/22 1100 04/24/22 0329  NA 134* 133* 132* 133* 131*  K 4.2 4.3 3.3* 2.4* 3.9  CL 97* 94* 94* 92* 95*  CO2 34* 32 31 34* 30  GLUCOSE 143* 99 95  150* 99  BUN '15 17 14 '$ 21* 17  CREATININE 0.39* 0.41* 0.39* 0.53 0.41*  CALCIUM 8.0* 8.0* 7.8* 7.8* 7.8*  MG 1.5*  --   --  1.7  --   PHOS 2.4*  --   --   --   --    Liver Function Tests: Recent Labs  Lab  04/19/22 0314 04/20/22 0314 04/21/22 0308  AST 40 41 30  ALT 29 32 26  ALKPHOS 66 77 76  BILITOT 1.4* 1.9* 2.4*  PROT 5.1* 5.2* 4.8*  ALBUMIN 2.0* 2.2* 1.9*   No results for input(s): "LIPASE", "AMYLASE" in the last 168 hours. No results for input(s): "AMMONIA" in the last 168 hours. CBC: Recent Labs  Lab 04/19/22 0314 04/22/22 2158 04/23/22 0310 04/24/22 0329  WBC 10.7* 13.9* 12.9* 9.1  HGB 9.2* 11.1* 10.5* 9.8*  HCT 28.2* 33.3* 31.8* 30.0*  MCV 105.6* 105.4* 105.6* 105.6*  PLT 74* 79* 85* 83*   Cardiac Enzymes: No results for input(s): "CKTOTAL", "CKMB", "CKMBINDEX", "TROPONINI" in the last 168 hours. BNP: Invalid input(s): "POCBNP" CBG: Recent Labs  Lab 04/23/22 0016 04/23/22 0658 04/23/22 1146 04/24/22 0010 04/24/22 1306  GLUCAP 138* 107* 144* 99 104*   D-Dimer No results for input(s): "DDIMER" in the last 72 hours. Hgb A1c No results for input(s): "HGBA1C" in the last 72 hours. Lipid Profile No results for input(s): "CHOL", "HDL", "LDLCALC", "TRIG", "CHOLHDL", "LDLDIRECT" in the last 72 hours. Thyroid function studies No results for input(s): "TSH", "T4TOTAL", "T3FREE", "THYROIDAB" in the last 72 hours.  Invalid input(s): "FREET3" Anemia work up No results for input(s): "VITAMINB12", "FOLATE", "FERRITIN", "TIBC", "IRON", "RETICCTPCT" in the last 72 hours. Urinalysis    Component Value Date/Time   COLORURINE YELLOW 04/08/2022 1448   APPEARANCEUR CLEAR 04/08/2022 1448   LABSPEC 1.042 (H) 04/08/2022 1448   PHURINE 6.0 04/08/2022 1448   GLUCOSEU NEGATIVE 04/08/2022 1448   HGBUR MODERATE (A) 04/08/2022 1448   BILIRUBINUR NEGATIVE 04/08/2022 1448   KETONESUR NEGATIVE 04/08/2022 1448   PROTEINUR NEGATIVE 04/08/2022 1448   NITRITE NEGATIVE 04/08/2022 1448   LEUKOCYTESUR NEGATIVE 04/08/2022 1448   Sepsis Labs Recent Labs  Lab 04/19/22 0314 04/22/22 2158 04/23/22 0310 04/24/22 0329  WBC 10.7* 13.9* 12.9* 9.1   Microbiology No results found for  this or any previous visit (from the past 240 hour(s)).  Please note: You were cared for by a hospitalist during your hospital stay. Once you are discharged, your primary care physician will handle any further medical issues. Please note that NO REFILLS for any discharge medications will be authorized once you are discharged, as it is imperative that you return to your primary care physician (or establish a relationship with a primary care physician if you do not have one) for your post hospital discharge needs so that they can reassess your need for medications and monitor your lab values.    Time coordinating discharge: 40 minutes  SIGNED:   Shelly Coss, MD  [facility] 04/25/2022, 10:27 AM Pager [phone]  If 7PM-7AM, please contact night-coverage [email or web address] Password [email or web address]

## 2022-04-25 NOTE — Progress Notes (Signed)
RA DL PICC removed per protocol per MD order. Manual pressure applied for 5 mins. Vaseline gauze, gauze, and Tegaderm applied over insertion site. No bleeding or swelling noted. Instructed patient to remain in bed for thirty mins. Educated patient about S/S of infection and when to call MD; no heavy lifting or pressure on right side for 24 hours; keep dressing dry and intact for 24 hours. Pt verbalized comprehension. 

## 2022-04-25 NOTE — Progress Notes (Signed)
[facility] Progress Note  7 Days Post-Op  Subjective: CC-  In good spirits. Excited to go home. Tolerating diet. Colostomy productive.  Objective: Vital signs in last 24 hours: Temp:  [98.7 F (37.1 C)-100 F (37.8 C)] 98.7 F (37.1 C) (09/08 0428) Pulse Rate:  [92-108] 104 (09/08 0428) Resp:  [18] 18 (09/08 0428) BP: (105-114)/(64-75) 114/75 (09/08 0428) SpO2:  [95 %-98 %] 95 % (09/08 0428) Last BM Date : 04/23/22  Intake/Output from previous day: 09/07 0701 - 09/08 0700 In: 1357.5 [P.O.:360; I.V.:997.5] Out: 350 [Stool:350] Intake/Output this shift: No intake/output data recorded.  PE: Gen:  Alert, NAD, pleasant Abd: soft, mild distension, ostomy edematous and pink with small amount of thin liquid brown stool in pouch. G tube clamped. Incision cdi without erythema or drainage  Lab Results:  Recent Labs    04/23/22 0310 04/24/22 0329  WBC 12.9* 9.1  HGB 10.5* 9.8*  HCT 31.8* 30.0*  PLT 85* 83*   BMET Recent Labs    04/23/22 1100 04/24/22 0329  NA 133* 131*  K 2.4* 3.9  CL 92* 95*  CO2 34* 30  GLUCOSE 150* 99  BUN 21* 17  CREATININE 0.53 0.41*  CALCIUM 7.8* 7.8*   PT/INR No results for input(s): "LABPROT", "INR" in the last 72 hours. CMP     Component Value Date/Time   NA 131 (L) 04/24/2022 0329   K 3.9 04/24/2022 0329   CL 95 (L) 04/24/2022 0329   CO2 30 04/24/2022 0329   GLUCOSE 99 04/24/2022 0329   BUN 17 04/24/2022 0329   CREATININE 0.41 (L) 04/24/2022 0329   CREATININE 0.55 03/24/2022 1029   CALCIUM 7.8 (L) 04/24/2022 0329   PROT 4.8 (L) 04/21/2022 0308   ALBUMIN 1.9 (L) 04/21/2022 0308   AST 30 04/21/2022 0308   AST 28 03/24/2022 1029   ALT 26 04/21/2022 0308   ALT 19 03/24/2022 1029   ALKPHOS 76 04/21/2022 0308   BILITOT 2.4 (H) 04/21/2022 0308   BILITOT 1.8 (H) 03/24/2022 1029   GFRNONAA >60 04/24/2022 0329   GFRNONAA >60 03/24/2022 1029   GFRAA  06/26/2010 0755    >60        The eGFR has been calculated using  the MDRD equation. This calculation has not been validated in all clinical situations. eGFR's persistently <60 mL/min signify possible Chronic Kidney Disease.   Lipase     Component Value Date/Time   LIPASE 44 04/03/2022 1126       Studies/Results: No results found.  Anti-infectives: Anti-infectives (From admission, onward)    Start     Dose/Rate Route Frequency Ordered Stop   04/14/22 1800  piperacillin-tazobactam (ZOSYN) IVPB 3.375 g  Status:  Discontinued        3.375 g 12.5 mL/hr over 240 Minutes Intravenous Every 8 hours 04/14/22 1224 04/21/22 1325   04/08/22 2100  ceFEPIme (MAXIPIME) 2 g in sodium chloride 0.9 % 100 mL IVPB  Status:  Discontinued        2 g 200 mL/hr over 30 Minutes Intravenous Every 12 hours 04/08/22 1453 04/14/22 1224   04/08/22 1600  metroNIDAZOLE (FLAGYL) IVPB 500 mg  Status:  Discontinued        500 mg 100 mL/hr over 60 Minutes Intravenous Every 12 hours 04/08/22 1453 04/14/22 1224   04/08/22 1515  piperacillin-tazobactam (ZOSYN) IVPB 3.375 g  Status:  Discontinued        3.375 g 12.5 mL/hr over 240 Minutes Intravenous Every 8 hours 04/08/22 1428  04/08/22 1447   04/07/22 2000  ceFEPIme (MAXIPIME) 2 g in sodium chloride 0.9 % 100 mL IVPB  Status:  Discontinued        2 g 200 mL/hr over 30 Minutes Intravenous Every 12 hours 04/07/22 1600 04/08/22 1428   04/04/22 0900  cefTRIAXone (ROCEPHIN) 2 g in sodium chloride 0.9 % 100 mL IVPB  Status:  Discontinued        2 g 200 mL/hr over 30 Minutes Intravenous Every 24 hours 04/04/22 0257 04/07/22 1600   04/03/22 1700  ceFEPIme (MAXIPIME) 2 g in sodium chloride 0.9 % 100 mL IVPB  Status:  Discontinued        2 g 200 mL/hr over 30 Minutes Intravenous Every 8 hours 04/03/22 1634 04/04/22 0257   04/03/22 1500  metroNIDAZOLE (FLAGYL) IVPB 500 mg  Status:  Discontinued        500 mg 100 mL/hr over 60 Minutes Intravenous Every 12 hours 04/03/22 1446 04/04/22 0257   04/03/22 1045  ceFEPIme (MAXIPIME) 2 g in  sodium chloride 0.9 % 100 mL IVPB        2 g 200 mL/hr over 30 Minutes Intravenous  Once 04/03/22 1031 04/03/22 1429   04/03/22 1045  vancomycin (VANCOCIN) IVPB 1000 mg/200 mL premix        1,000 mg 200 mL/hr over 60 Minutes Intravenous  Once 04/03/22 1031 04/03/22 1241        Assessment/Plan Metastatic rectal cancer with complete obstruction -POD#7 s/p Diagnostic laparoscopy converted to exploratory laparotomy, placement of gastrostomy tube, diverting transverse loop colostomy 9/1 Dr. Georgette Dover - continue soft diet - G tube clamped, ok to vent PRN nausea - ostomy productive. WOC following for new ostomy education  - mobilize, PT/OT - recommending home health therapies. Plan is home with hospice once symptoms controlled - IR drain removed 9/5 - ok for discharge today from surgical standpoint. Follow up as needed. 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

## 2022-04-26 ENCOUNTER — Other Ambulatory Visit: Payer: Self-pay

## 2022-04-27 ENCOUNTER — Other Ambulatory Visit: Payer: Self-pay | Admitting: Hematology

## 2022-04-29 ENCOUNTER — Telehealth: Payer: Self-pay

## 2022-04-29 NOTE — Telephone Encounter (Signed)
This patient's husband called and stated that he is returning a call directly to Dr. Burr Medico.  He states that they are supposed to talk about Hospice services. He is stating for Dr. Burr Medico to give them a call back.  This message has been forwarded to the MD.  No further questions or concerns noted.

## 2022-05-05 ENCOUNTER — Inpatient Hospital Stay: Payer: 59 | Admitting: Nutrition

## 2022-05-05 ENCOUNTER — Inpatient Hospital Stay: Payer: 59 | Admitting: Hematology

## 2022-05-05 ENCOUNTER — Inpatient Hospital Stay: Payer: 59

## 2022-05-20 ENCOUNTER — Encounter: Payer: Self-pay | Admitting: Hematology

## 2022-06-20 ENCOUNTER — Other Ambulatory Visit: Payer: Self-pay | Admitting: Hematology

## 2022-09-18 DEATH — deceased

## 2023-08-16 IMAGING — CT CT HEAD W/O CM
3 of 6 series · 16 of 47 positions shown, 19 images · non-contrast
Comparison: None.

CLINICAL DATA: Mental status change.

EXAM:
CT HEAD WITHOUT CONTRAST
TECHNIQUE: Contiguous axial images were obtained from the base of the skull
through the vertex without intravenous contrast.

[Series 6: head wo · axial · 0.59mm/px · z∈[-523,-368]mm · 11 of 37 slices shown, 14 images]
[im 3/37  brain]
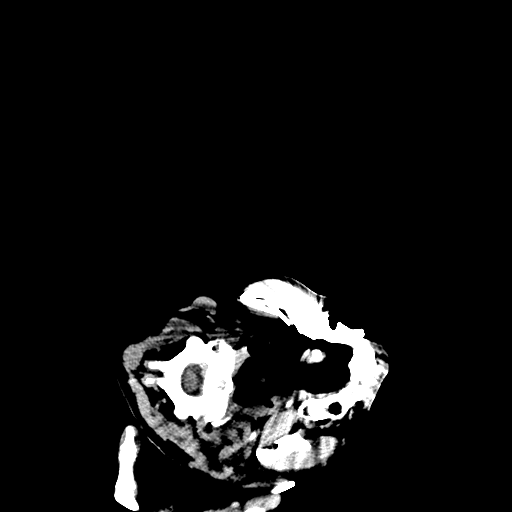
[im 3/37  bone]
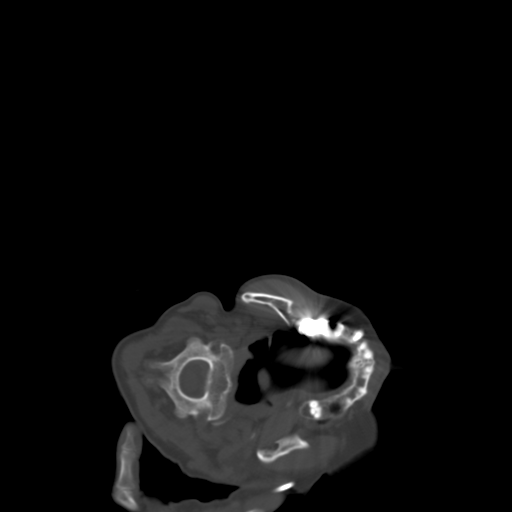
[im 7/37  brain]
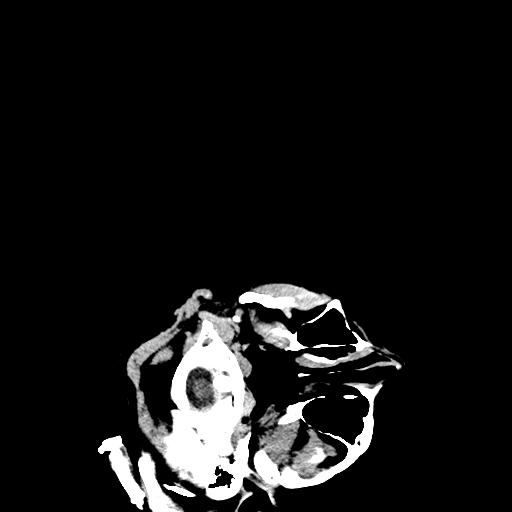
[im 9/37  brain]
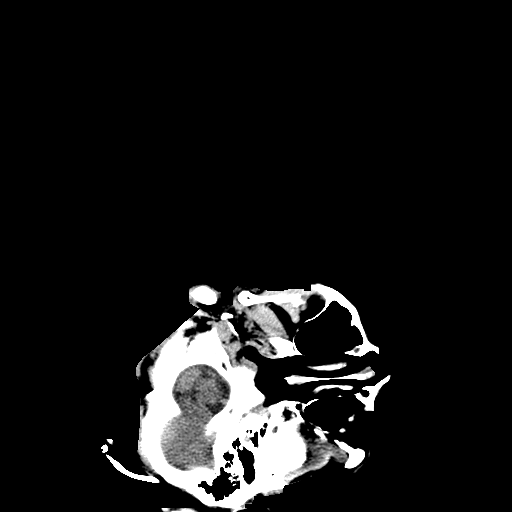
[im 13/37  brain]
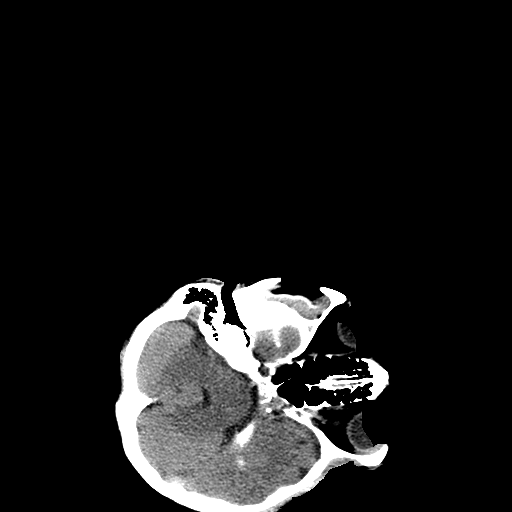
[im 15/37  brain]
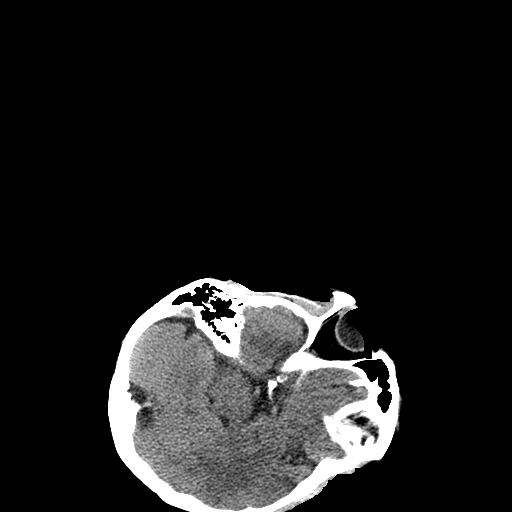
[im 15/37  bone]
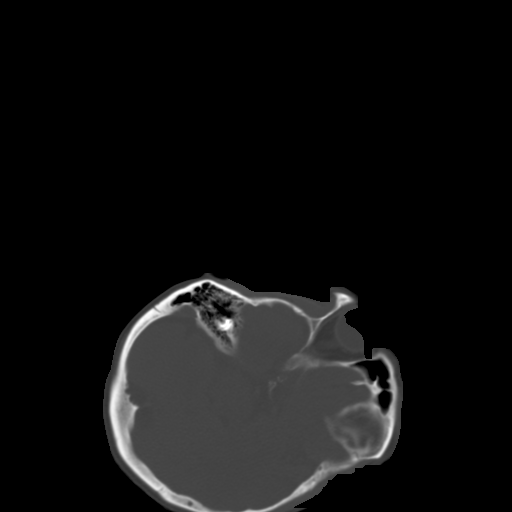
[im 20/37  brain]
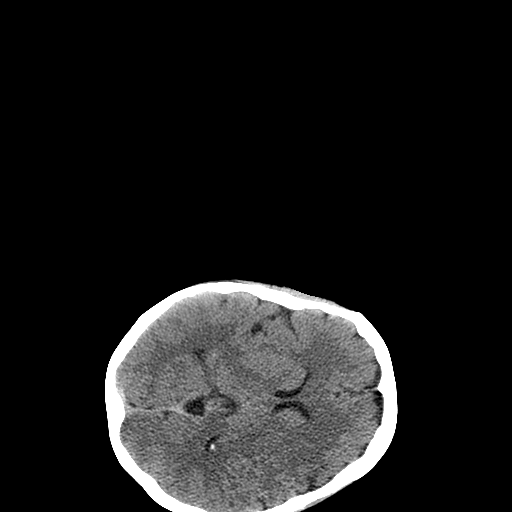
[im 22/37  brain]
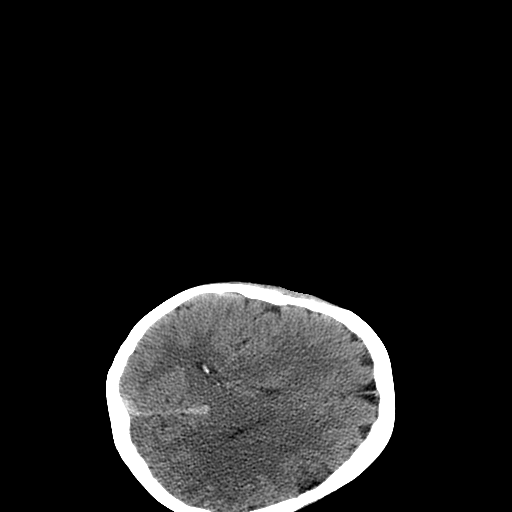
[im 24/37  brain]
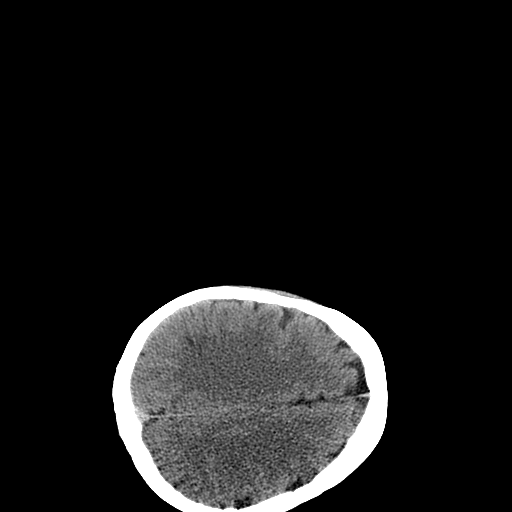
[im 28/37  brain]
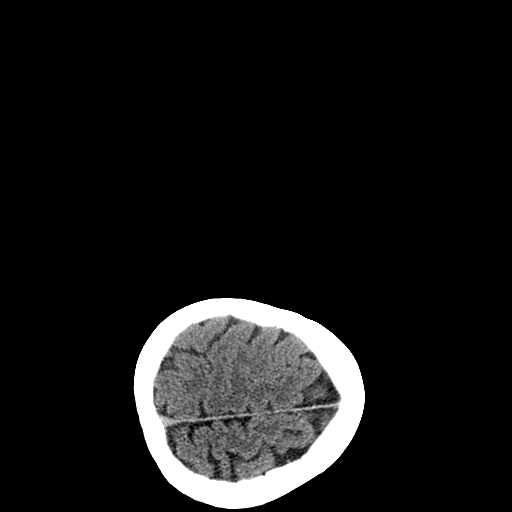
[im 28/37  bone]
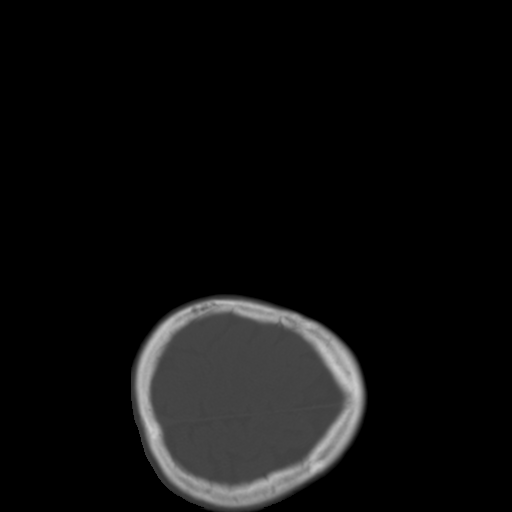
[im 30/37  brain]
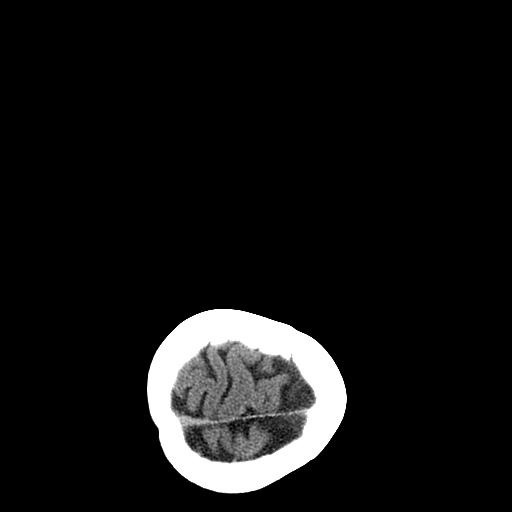
[im 34/37  brain]
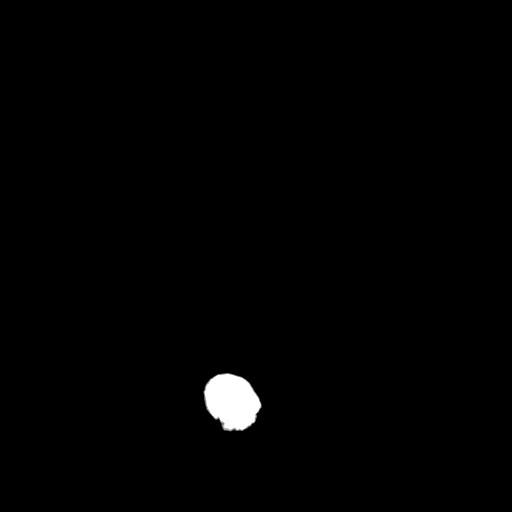

[Series 12: coronal soft tissue · coronal · 0.36mm/px · 3 of 61 slices shown]
[im 16/61  brain]
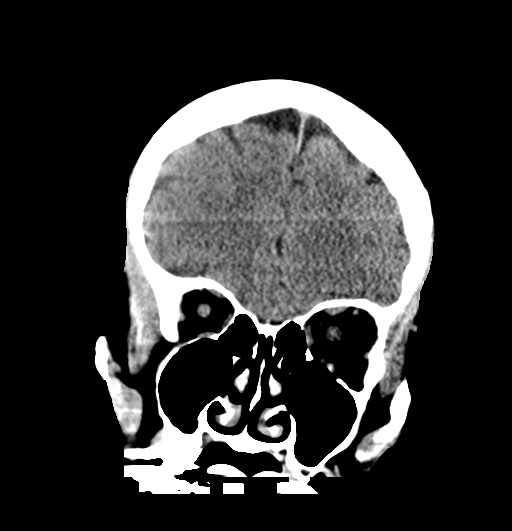
[im 31/61  brain]
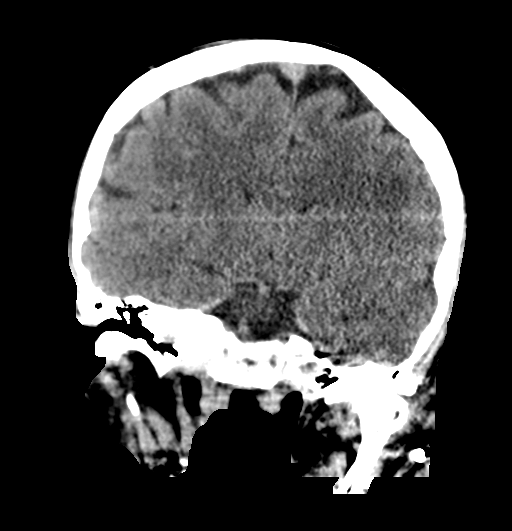
[im 46/61  brain]
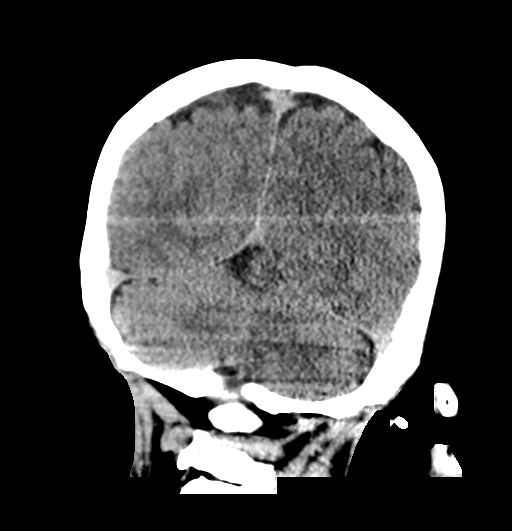

[Series 15: sagittal soft tissue · sagittal · 0.26mm/px · 2 of 50 slices shown]
[im 17/50  brain]
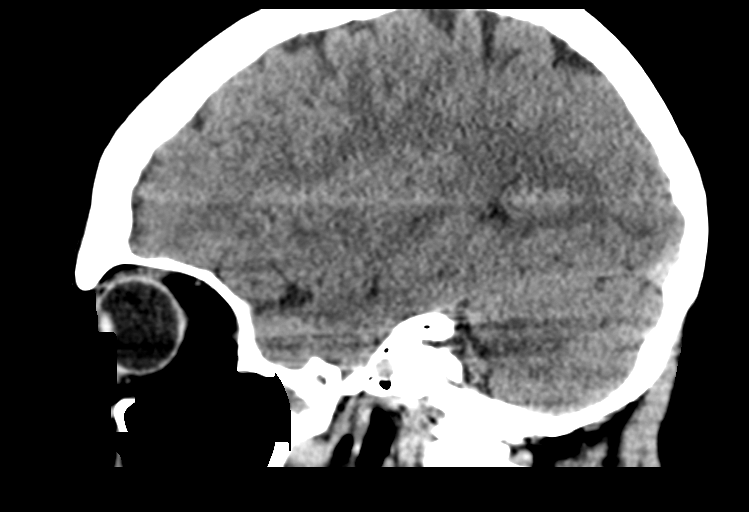
[im 33/50  brain]
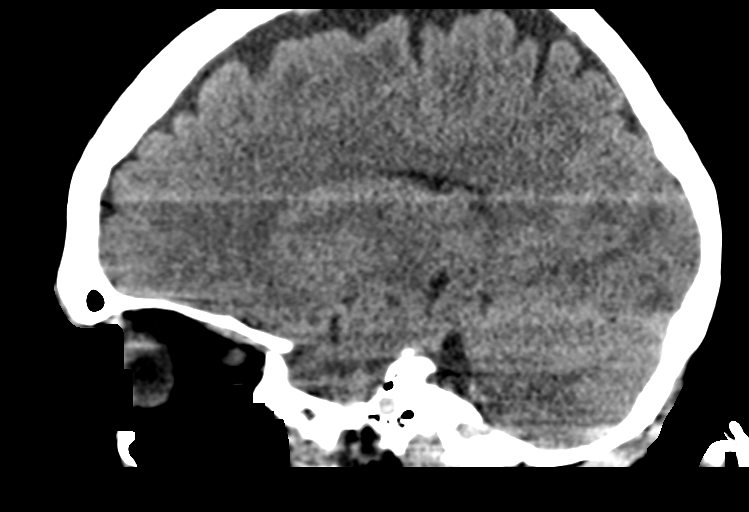

[16 of 47 positions shown; findings below may reference images not displayed]

FINDINGS: Brain: Limited exam. Ventricles and sulci are appropriate for
patient's age. No evidence for acute cortically based infarct,
intracranial hemorrhage, mass lesion or mass-effect.

Vascular: Unremarkable

Skull: Intact.

Sinuses/Orbits: Paranasal sinuses are well aerated. Mastoid air
cells are unremarkable.

Other: None.
IMPRESSION: No acute intracranial process. Limited exam due to patient
positioning.

## 2023-08-16 IMAGING — CT CT ABD-PELV W/ CM
2 of 5 series · 16 of 46 positions shown, 18 images · IV contrast (OMNIPAQUE 350)
Comparison: PET-CT 03/04/2021. CT chest abdomen and pelvis
02/08/2021.

CLINICAL DATA: Nausea and vomiting. Colorectal cancer. On
chemotherapy.

EXAM:
CT ABDOMEN AND PELVIS WITH CONTRAST
TECHNIQUE: Multidetector CT imaging of the abdomen and pelvis was performed
using the standard protocol following bolus administration of
intravenous contrast.
CONTRAST:  80mL OMNIPAQUE IOHEXOL 350 MG/ML SOLN

[Series 2: axial st · axial · 0.68mm/px · z∈[-466,-71]mm · 13 of 93 slices shown, 15 images]
[im 7/93  soft-tissue]
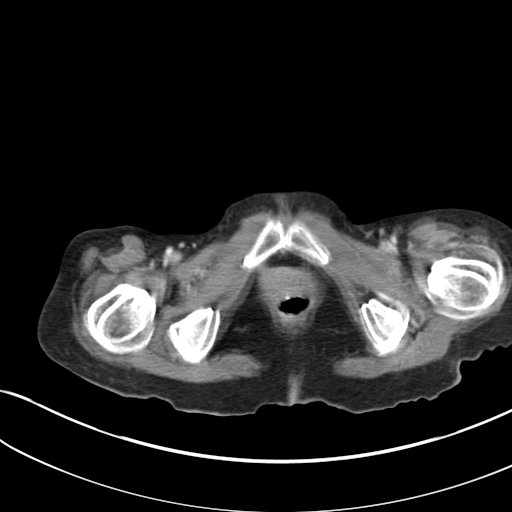
[im 7/93  bone]
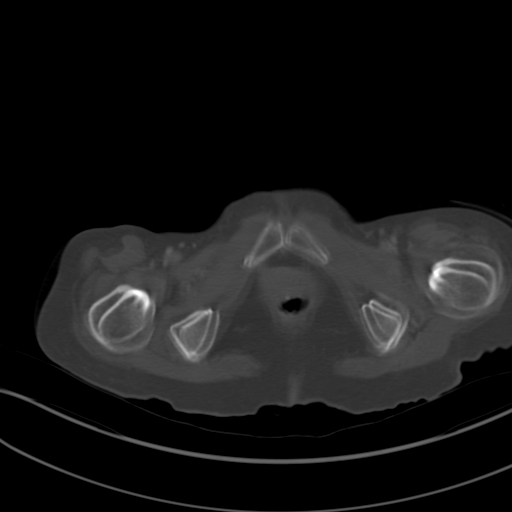
[im 14/93  soft-tissue]
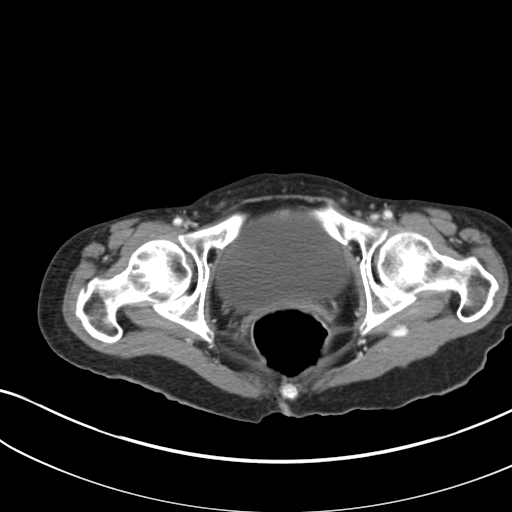
[im 20/93  soft-tissue]
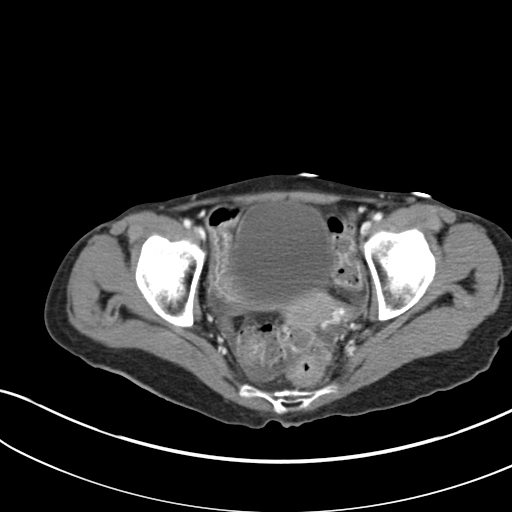
[im 27/93  soft-tissue]
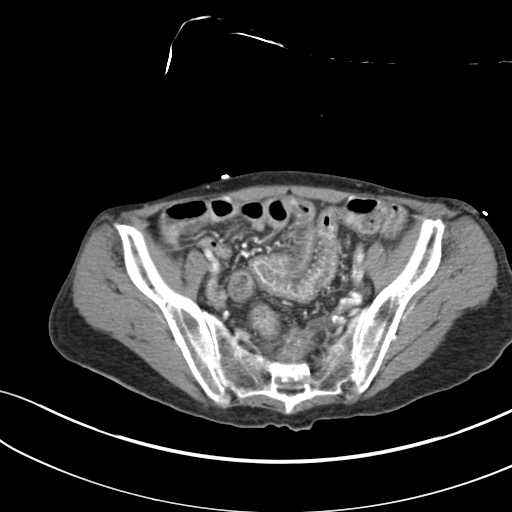
[im 33/93  soft-tissue]
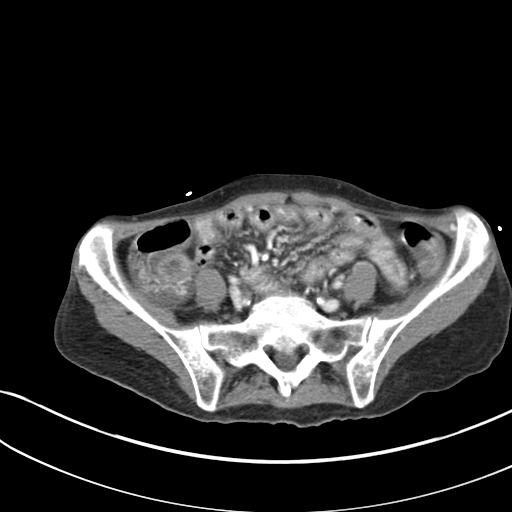
[im 40/93  soft-tissue]
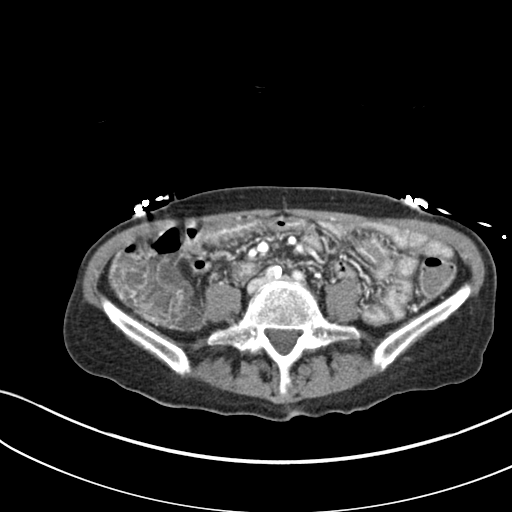
[im 47/93  soft-tissue]
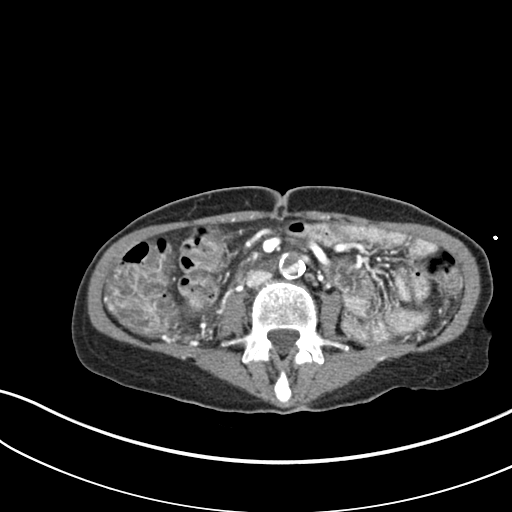
[im 53/93  soft-tissue]
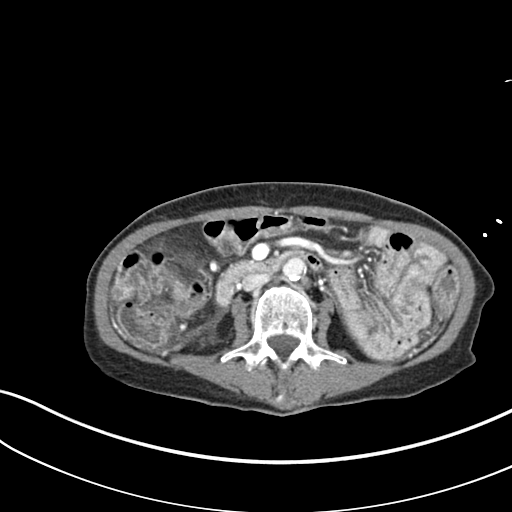
[im 60/93  soft-tissue]
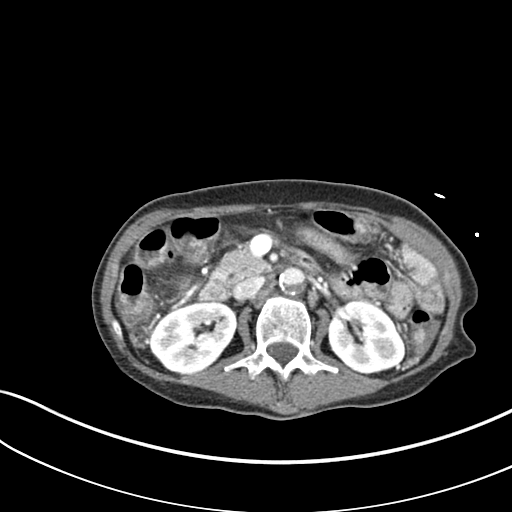
[im 60/93  bone]
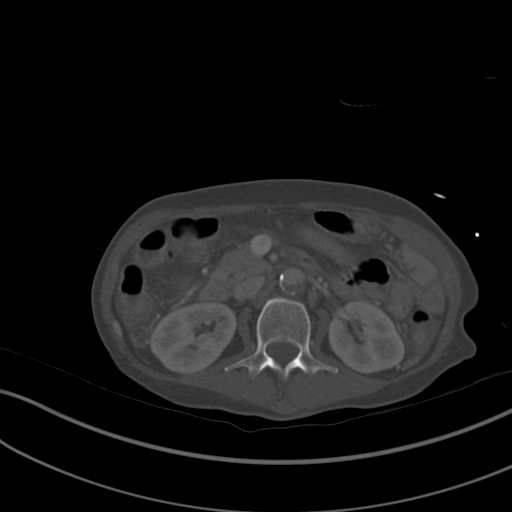
[im 66/93  soft-tissue]
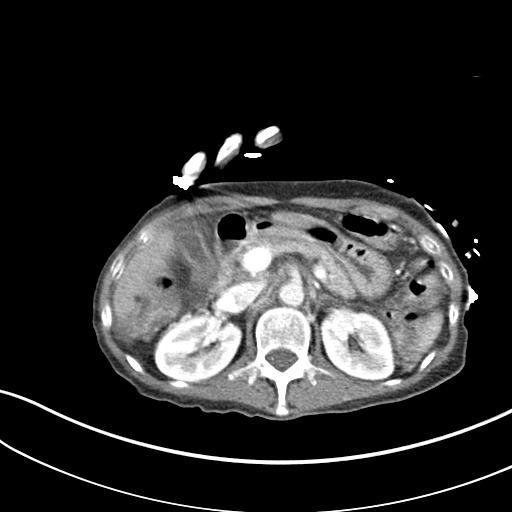
[im 73/93  soft-tissue]
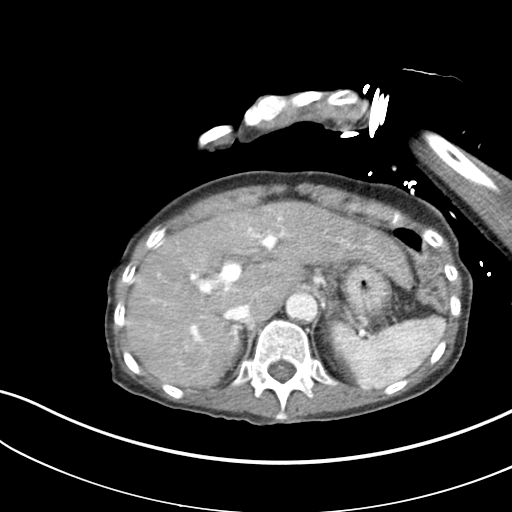
[im 79/93  soft-tissue]
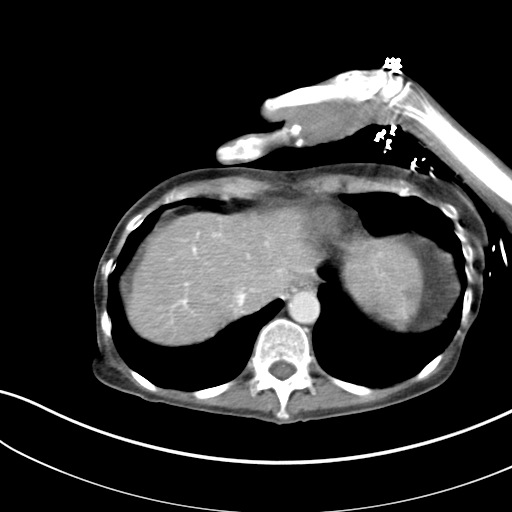
[im 86/93  soft-tissue]
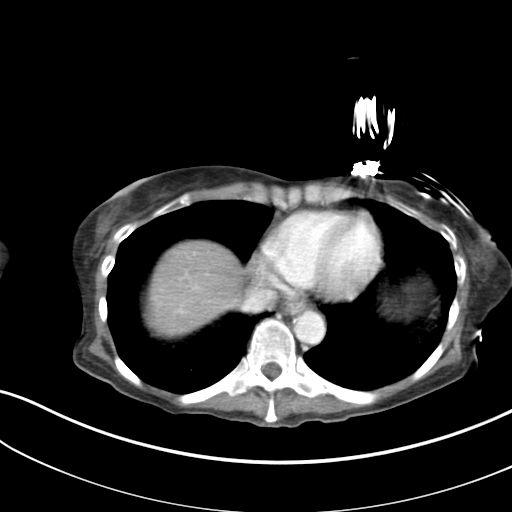

[Series 5: coronal st · coronal · 0.88mm/px · 3 of 90 slices shown]
[im 30/90  soft-tissue]
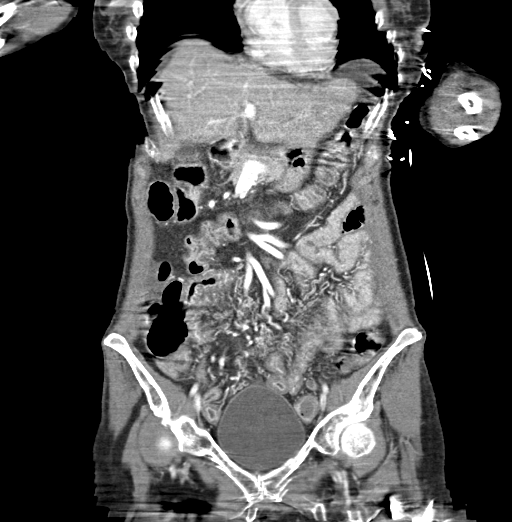
[im 40/90  soft-tissue]
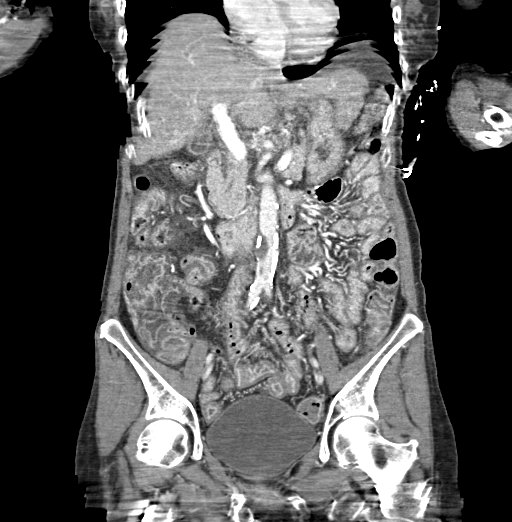
[im 50/90  soft-tissue]
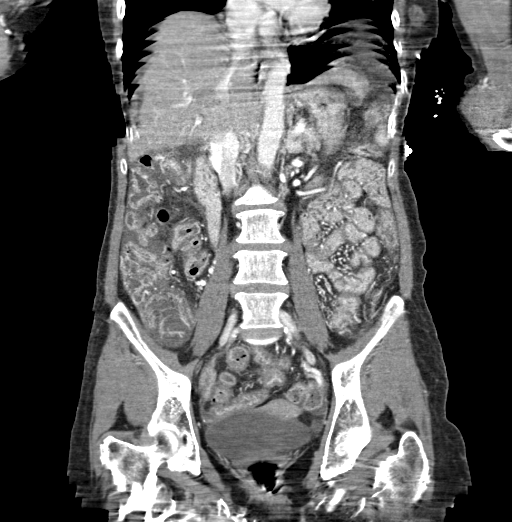

[16 of 46 positions shown; findings below may reference images not displayed]

FINDINGS: Lower chest: No acute abnormality.

Hepatobiliary: Limited evaluation secondary to motion artifact.
Previously identified small liver lesions are not well seen on the
study. Gallbladder is grossly within normal limits.

Pancreas: Unremarkable. No pancreatic ductal dilatation or
surrounding inflammatory changes.

Spleen: Normal in size without focal abnormality.

Adrenals/Urinary Tract: Adrenal glands are unremarkable. Kidneys are
normal, without renal calculi, focal lesion, or hydronephrosis.
Bladder is unremarkable.

Stomach/Bowel: There is wall thickening and edema of the ascending
colon. There is no surrounding inflammatory stranding. The appendix
is within normal limits. There is also wall thickening and
submucosal enhancement of ileal loops diffusely. Jejunal loops and
stomach are within normal limits. There is no bowel obstruction,
free air or pneumatosis identified.

Chest there is wall thickening at the rectosigmoid junction in the
region of previously identified mass. This has decreased when
compared to the prior study.

Vascular/Lymphatic: There are atherosclerotic calcifications
throughout the aorta. There is no evidence for aneurysm. There is
new noncalcified thrombus in the infrarenal abdominal aorta causing
severe stenosis.

Previously identified enlarged left pelvic lymph node is now normal
in size. No definite new enlarged lymph nodes are identified.

Reproductive: Uterus and bilateral adnexa are unremarkable.

Other: There is a small amount of ascites. There is no focal
abdominal wall hernia.

Musculoskeletal: No fracture is seen.
IMPRESSION: 1. There is new noncalcified thrombus in the infrarenal abdominal
aorta causing severe stenosis.
2. Wall thickening of the ascending colon, cecum and ileum worrisome
for enterocolitis. Findings may be infectious, inflammatory or
ischemic given findings in the aorta. No pneumatosis or free air.
3. Small amount of ascites.
4. Rectosigmoid mass has decreased in size. Left pelvic lymph node
has decreased in size.
5. Previously identified liver lesions not well characterized on
this study secondary to motion artifact.

## 2023-08-24 IMAGING — DX DG CHEST 1V PORT
1 series · 1 of 1 positions shown · non-contrast
Comparison: May 18, 2021

CLINICAL DATA: Short of breath.  History of stage IV rectal cancer.

EXAM:
PORTABLE CHEST 1 VIEW

[chest ap]
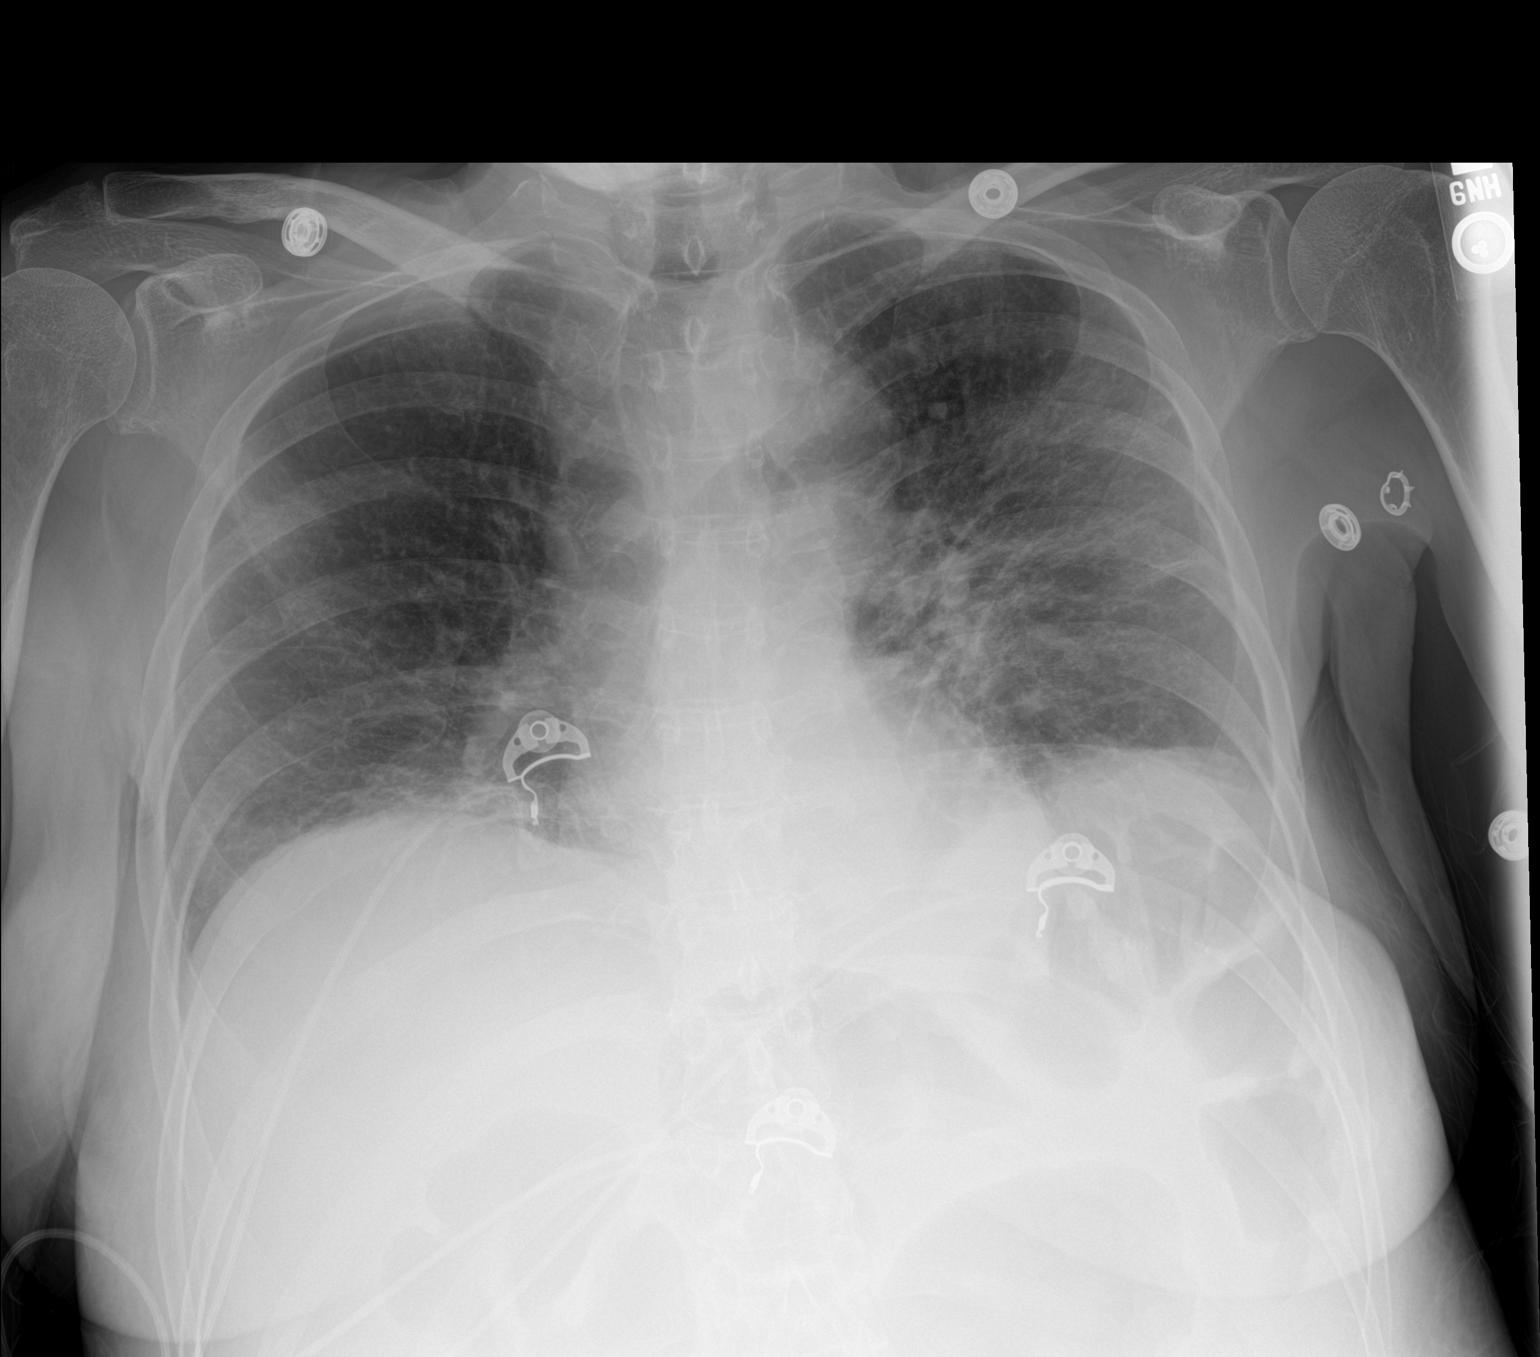

[1 of 1 positions shown; findings below may reference images not displayed]

FINDINGS: Cardiomediastinal silhouette is normal. Mediastinal contours appear
intact. Tortuosity of the aorta.

Low lung volumes. Peribronchovascular thickening in bilateral lungs
more pronounced on the left.

Osseous structures are without acute abnormality. Soft tissues are
grossly normal.
IMPRESSION: 1. Low lung volumes.
2. Peribronchovascular thickening in bilateral lungs more pronounced
on the left, which may be seen with asymmetric pulmonary edema,
bronchitic changes, or lymphangitic spread of malignancy.
